# Patient Record
Sex: Female | Born: 1950 | Race: White | Hispanic: No | Marital: Single | Smoking: Never smoker
Health system: Southern US, Community
[De-identification: ages and names within clinical notes are randomized; demographics above are authoritative.]

## PROBLEM LIST (undated history)

## (undated) DIAGNOSIS — M75100 Unspecified rotator cuff tear or rupture of unspecified shoulder, not specified as traumatic: Secondary | ICD-10-CM

## (undated) DIAGNOSIS — F418 Other specified anxiety disorders: Secondary | ICD-10-CM

## (undated) DIAGNOSIS — R9431 Abnormal electrocardiogram [ECG] [EKG]: Secondary | ICD-10-CM

## (undated) DIAGNOSIS — M1990 Unspecified osteoarthritis, unspecified site: Secondary | ICD-10-CM

## (undated) DIAGNOSIS — S42209A Unspecified fracture of upper end of unspecified humerus, initial encounter for closed fracture: Secondary | ICD-10-CM

## (undated) DIAGNOSIS — Z0000 Encounter for general adult medical examination without abnormal findings: Secondary | ICD-10-CM

## (undated) DIAGNOSIS — E785 Hyperlipidemia, unspecified: Secondary | ICD-10-CM

## (undated) DIAGNOSIS — F32A Depression, unspecified: Secondary | ICD-10-CM

## (undated) DIAGNOSIS — Z889 Allergy status to unspecified drugs, medicaments and biological substances status: Secondary | ICD-10-CM

## (undated) DIAGNOSIS — T7840XA Allergy, unspecified, initial encounter: Secondary | ICD-10-CM

## (undated) DIAGNOSIS — F419 Anxiety disorder, unspecified: Secondary | ICD-10-CM

## (undated) DIAGNOSIS — F329 Major depressive disorder, single episode, unspecified: Secondary | ICD-10-CM

## (undated) DIAGNOSIS — IMO0002 Reserved for concepts with insufficient information to code with codable children: Secondary | ICD-10-CM

## (undated) DIAGNOSIS — M7500 Adhesive capsulitis of unspecified shoulder: Secondary | ICD-10-CM

## (undated) DIAGNOSIS — D649 Anemia, unspecified: Secondary | ICD-10-CM

## (undated) DIAGNOSIS — S52502A Unspecified fracture of the lower end of left radius, initial encounter for closed fracture: Secondary | ICD-10-CM

## (undated) DIAGNOSIS — G90519 Complex regional pain syndrome I of unspecified upper limb: Secondary | ICD-10-CM

## (undated) DIAGNOSIS — H269 Unspecified cataract: Secondary | ICD-10-CM

## (undated) HISTORY — DX: Major depressive disorder, single episode, unspecified: F32.9

## (undated) HISTORY — DX: Anemia, unspecified: D64.9

## (undated) HISTORY — PX: TONSILLECTOMY: SUR1361

## (undated) HISTORY — PX: FRACTURE SURGERY: SHX138

## (undated) HISTORY — DX: Allergy status to unspecified drugs, medicaments and biological substances status: Z88.9

## (undated) HISTORY — PX: LASIK: SHX215

## (undated) HISTORY — DX: Unspecified rotator cuff tear or rupture of unspecified shoulder, not specified as traumatic: M75.100

## (undated) HISTORY — DX: Reserved for concepts with insufficient information to code with codable children: IMO0002

## (undated) HISTORY — DX: Unspecified cataract: H26.9

## (undated) HISTORY — DX: Adhesive capsulitis of unspecified shoulder: M75.00

## (undated) HISTORY — DX: Complex regional pain syndrome I of unspecified upper limb: G90.519

## (undated) HISTORY — DX: Hyperlipidemia, unspecified: E78.5

## (undated) HISTORY — PX: EYE SURGERY: SHX253

## (undated) HISTORY — PX: JOINT REPLACEMENT: SHX530

## (undated) HISTORY — DX: Anxiety disorder, unspecified: F41.9

## (undated) HISTORY — DX: Unspecified osteoarthritis, unspecified site: M19.90

## (undated) HISTORY — PX: DENTAL SURGERY: SHX609

## (undated) HISTORY — DX: Other specified anxiety disorders: F41.8

## (undated) HISTORY — DX: Abnormal electrocardiogram (ECG) (EKG): R94.31

## (undated) HISTORY — DX: Depression, unspecified: F32.A

## (undated) HISTORY — DX: Allergy, unspecified, initial encounter: T78.40XA

## (undated) HISTORY — DX: Encounter for general adult medical examination without abnormal findings: Z00.00

---

## 1998-11-10 HISTORY — PX: REFRACTIVE SURGERY: SHX103

## 2012-12-02 ENCOUNTER — Encounter: Payer: Self-pay | Admitting: Internal Medicine

## 2012-12-02 ENCOUNTER — Ambulatory Visit (INDEPENDENT_AMBULATORY_CARE_PROVIDER_SITE_OTHER): Payer: Managed Care, Other (non HMO) | Admitting: Internal Medicine

## 2012-12-02 VITALS — BP 128/74 | HR 71 | Temp 97.9°F | Resp 18 | Ht 60.0 in | Wt 148.0 lb

## 2012-12-02 DIAGNOSIS — F32A Depression, unspecified: Secondary | ICD-10-CM

## 2012-12-02 DIAGNOSIS — F3289 Other specified depressive episodes: Secondary | ICD-10-CM

## 2012-12-02 DIAGNOSIS — Z2911 Encounter for prophylactic immunotherapy for respiratory syncytial virus (RSV): Secondary | ICD-10-CM

## 2012-12-02 DIAGNOSIS — M65839 Other synovitis and tenosynovitis, unspecified forearm: Secondary | ICD-10-CM

## 2012-12-02 DIAGNOSIS — F418 Other specified anxiety disorders: Secondary | ICD-10-CM

## 2012-12-02 DIAGNOSIS — Z23 Encounter for immunization: Secondary | ICD-10-CM

## 2012-12-02 DIAGNOSIS — L309 Dermatitis, unspecified: Secondary | ICD-10-CM | POA: Insufficient documentation

## 2012-12-02 DIAGNOSIS — K219 Gastro-esophageal reflux disease without esophagitis: Secondary | ICD-10-CM

## 2012-12-02 DIAGNOSIS — L259 Unspecified contact dermatitis, unspecified cause: Secondary | ICD-10-CM

## 2012-12-02 DIAGNOSIS — F411 Generalized anxiety disorder: Secondary | ICD-10-CM

## 2012-12-02 DIAGNOSIS — M1990 Unspecified osteoarthritis, unspecified site: Secondary | ICD-10-CM

## 2012-12-02 DIAGNOSIS — M778 Other enthesopathies, not elsewhere classified: Secondary | ICD-10-CM | POA: Insufficient documentation

## 2012-12-02 DIAGNOSIS — F329 Major depressive disorder, single episode, unspecified: Secondary | ICD-10-CM

## 2012-12-02 DIAGNOSIS — F419 Anxiety disorder, unspecified: Secondary | ICD-10-CM | POA: Insufficient documentation

## 2012-12-02 HISTORY — DX: Other specified anxiety disorders: F41.8

## 2012-12-02 MED ORDER — CLONAZEPAM 0.5 MG PO TABS: 0.5000 mg | ORAL_TABLET | Freq: Three times a day (TID) | ORAL | Status: DC | PRN

## 2012-12-02 MED ORDER — PANTOPRAZOLE SODIUM 40 MG PO TBEC: 40.0000 mg | DELAYED_RELEASE_TABLET | Freq: Every day | ORAL | Status: DC

## 2012-12-02 NOTE — Patient Instructions (Signed)
Schedule CPE  Flu vaccine today  Call when Shingles vaccine is ready

## 2012-12-02 NOTE — Progress Notes (Signed)
Subjective:    Patient ID: Stephanie Barnes, female    DOB: [birth year], 62 y.o.   MRN: [id number]  HPI  New pt here for first visit.  Moved here from [place] to live with family.  Former care Dr. Vernie Ammons.  PMH of DJD, GERD, anxiety controlled with klonopin,  Depression on wellbutrin and eczema.  She also reports chronic tendinitis of hand  She sees a Chiropodist and an acupuncturist here is [place]  Doing well   She is trying ot taper off Klonopin   She would like a shingles and fluc vaccine today  Allergies  Allergen Reactions  . Aspirin Swelling  . Celebrex (Celecoxib) Anaphylaxis  . Celexa (Citalopram)   . Doxycycline   . Other     Arthritec  . Prednisone    Past Medical History  Diagnosis Date  . Anxiety   . Arthritis   . Depression    Past Surgical History  Procedure Date  . Tonsillectomy    History   Social History  . Marital Status: Single    Spouse Name: N/A    Number of Children: N/A  . Years of Education: N/A   Occupational History  . Not on file.   Social History Main Topics  . Smoking status: Never Smoker   . Smokeless tobacco: Not on file  . Alcohol Use: 0.6 oz/week    1 Glasses of wine per week     Comment: per month  . Drug Use: No  . Sexually Active: No   Other Topics Concern  . Not on file   Social History Narrative  . No narrative on file   Family History  Problem Relation Age of Onset  . Heart disease Mother   . Heart disease Father   . COPD Father   . Mental illness Brother   . Hypertension Brother   . Suicidality Brother   . Heart disease Maternal Aunt   . Heart disease Maternal Uncle   . Heart disease Paternal Aunt   . COPD Paternal Aunt   . Heart disease Paternal Uncle   . Heart disease Maternal Grandmother   . Heart disease Maternal Grandfather   . Heart disease Paternal Grandmother   . Heart disease Paternal Grandfather    There is no problem list on file for this patient.  Current Outpatient Prescriptions on  File Prior to Visit  Medication Sig Dispense Refill  . buPROPion (WELLBUTRIN) 75 MG tablet Take 75 mg by mouth 2 (two) times daily. 1/4 tablet daily      . Calcium Carbonate-Vit D-Min (CALCIUM 1200 PO) Take 1 tablet by mouth 2 (two) times daily.      . clonazePAM (KLONOPIN) 0.5 MG tablet Take 0.5 mg by mouth 3 (three) times daily as needed.      . pantoprazole (PROTONIX) 40 MG tablet Take 40 mg by mouth daily.          Review of Systems       see HPI Objective:   Physical Exam Physical Exam  Nursing note and vitals reviewed.  Constitutional: She is oriented to person, place, and time. She appears well-developed and well-nourished.  HENT:  Head: Normocephalic and atraumatic.  Cardiovascular: Normal rate and regular rhythm. Exam reveals no gallop and no friction rub.  No murmur heard.  Pulmonary/Chest: Breath sounds normal. She has no wheezes. She has no rales.  Neurological: She is alert and oriented to person, place, and time.  Skin: Skin is warm and dry.  Psychiatric: She has a normal mood and affect. Her behavior is normal.             Assessment & Plan:  GERD  Continue Prevacid  Anxiety/depression continue current meds  Eczema  Controlled with Elocon  djd  Chronic tendinitis  Schedule cpe  Pt declines labs today   Will give influenza and Zostavax today

## 2013-01-07 ENCOUNTER — Other Ambulatory Visit: Payer: Self-pay | Admitting: *Deleted

## 2013-01-07 NOTE — Telephone Encounter (Signed)
Verified with pt pharmacy called in to [facility] pharmacy

## 2013-01-07 NOTE — Telephone Encounter (Signed)
Needs refill will call in pending approval

## 2013-01-11 ENCOUNTER — Telehealth: Payer: Self-pay | Admitting: *Deleted

## 2013-01-11 NOTE — Telephone Encounter (Signed)
Explained to pt that Dr Constance Goltz does not write rx or tx for a condition she is not seeing pt for. Pt reports that she has not seen anyone for SOS as of yet she has only spoken on the phone. Pt reports that they will treat her if she has a rx from [facility]. Suggested that her chiropractor or her previous acupuncturist could write for her tx. Pt states that they will not do so because it is a workers comp case. Pt responded by saying thank you and promptly hanging up on this RN

## 2013-01-17 ENCOUNTER — Other Ambulatory Visit: Payer: Self-pay | Admitting: Internal Medicine

## 2013-01-17 MED ORDER — CLONAZEPAM 0.5 MG PO TABS: 0.5000 mg | ORAL_TABLET | Freq: Three times a day (TID) | ORAL | Status: DC | PRN

## 2013-01-17 NOTE — Telephone Encounter (Signed)
See Ardenia's note will call in pending approval

## 2013-01-17 NOTE — Telephone Encounter (Signed)
Pt would like to know why there was only a thirty day supply of her clonazePAM (KLONOPIN) 0.5 MG tablet ... Per pt she take three a day; she need a 90 day supply of this medication... Pt would like a call back [phone]

## 2013-01-17 NOTE — Telephone Encounter (Signed)
Pt called to verify pharmacy and rx called in

## 2013-01-17 NOTE — Telephone Encounter (Signed)
Stephanie Barnes  OK to call in no refill

## 2013-02-23 ENCOUNTER — Other Ambulatory Visit: Payer: Self-pay | Admitting: *Deleted

## 2013-02-23 NOTE — Telephone Encounter (Signed)
Will call in pending approval 

## 2013-02-24 ENCOUNTER — Telehealth: Payer: Self-pay | Admitting: Internal Medicine

## 2013-02-24 MED ORDER — CLONAZEPAM 0.5 MG PO TABS: 0.5000 mg | ORAL_TABLET | Freq: Three times a day (TID) | ORAL | Status: DC | PRN

## 2013-02-24 NOTE — Telephone Encounter (Signed)
Pt would like refill on Clonazepam 0.5 mg.  To be called into pharmacy [facility] [phone]. Pt call back phone number [phone].

## 2013-02-24 NOTE — Telephone Encounter (Signed)
Klonopin called in to [facility] pt notified

## 2013-02-24 NOTE — Telephone Encounter (Signed)
Karen Kitchens   Ok to call in [place] as ordered    Thanks

## 2013-03-01 NOTE — Telephone Encounter (Signed)
Called in Klonopin [facility]

## 2013-04-14 ENCOUNTER — Other Ambulatory Visit: Payer: Self-pay | Admitting: Internal Medicine

## 2013-04-14 MED ORDER — CLONAZEPAM 0.5 MG PO TABS: 0.5000 mg | ORAL_TABLET | Freq: Three times a day (TID) | ORAL | Status: DC | PRN

## 2013-04-14 NOTE — Telephone Encounter (Signed)
Pt states that she tried and began to get depressed again states that she is in grief counseling presently will call this in per Dr Constance Goltz VO

## 2013-04-14 NOTE — Telephone Encounter (Signed)
Pt needs refill for Clonazepam 0.5 mg Qty 90 a month.  Pharmacy [facility] [phone].  Pt phone number [phone].

## 2013-05-17 ENCOUNTER — Other Ambulatory Visit: Payer: Self-pay | Admitting: *Deleted

## 2013-05-17 MED ORDER — CLONAZEPAM 0.5 MG PO TABS: 0.5000 mg | ORAL_TABLET | Freq: Three times a day (TID) | ORAL | Status: DC | PRN

## 2013-05-17 NOTE — Telephone Encounter (Signed)
Needs refill on clonazePAM (KLONOPIN) 0.5 MG tablet  Called into pharmacy.  She has 4 days left.  She will be having oral surgery this afternoon and will be unable to talk on the phone today.  If you need to talk to her try her tomorrow.

## 2013-05-17 NOTE — Telephone Encounter (Signed)
Klonopin called in to [facility] pharmacy pt states that she usually only takes 2 a day but if she is having a bad day will take three. Notified pt that we will call in 30 tablets with one refill and that she will need to see Dr Constance Goltz in the office if this is not an effective number of tablets

## 2013-05-17 NOTE — Telephone Encounter (Addendum)
Will call in pending approval 

## 2013-05-17 NOTE — Telephone Encounter (Signed)
Stephanie Barnes  Call this pt. And ask how often she is using her Klonopin .   Refill wanted 90 pills.  I usually do not give 90 of a benzodiazepine.    Message back wilth response.  I approved 30 pills with 1 refill

## 2013-06-09 ENCOUNTER — Ambulatory Visit (INDEPENDENT_AMBULATORY_CARE_PROVIDER_SITE_OTHER): Payer: Managed Care, Other (non HMO) | Admitting: Internal Medicine

## 2013-06-09 ENCOUNTER — Encounter: Payer: Self-pay | Admitting: Internal Medicine

## 2013-06-09 VITALS — BP 136/88 | HR 98 | Temp 97.0°F | Resp 18 | Wt 139.0 lb

## 2013-06-09 DIAGNOSIS — G4700 Insomnia, unspecified: Secondary | ICD-10-CM | POA: Insufficient documentation

## 2013-06-09 DIAGNOSIS — J329 Chronic sinusitis, unspecified: Secondary | ICD-10-CM

## 2013-06-09 MED ORDER — AZITHROMYCIN 250 MG PO TABS: ORAL_TABLET | ORAL | Status: DC

## 2013-06-09 MED ORDER — CEFTRIAXONE SODIUM 1 G IJ SOLR
1.0000 g | Freq: Once | INTRAMUSCULAR | Status: AC
  Administered 2013-06-09: 1 g via INTRAMUSCULAR

## 2013-06-09 NOTE — Progress Notes (Signed)
Subjective:    Patient ID: Stephanie Barnes, female    DOB: 12/31/1950, 62 y.o.   MRN: [id number]  HPI  Stephanie Barnes is here with acute visit.  She is having lots of facial and sinus pain.  She describes that she has had 2 periodontal surgeries.  6/30 and 7/8  She was given two 5 day courses of Amoxicillin.  She describes post operatively lots of pain and she could barely eat  - she lost 13 lbs in two weeks.    For the past 10-14 days she has had severe maxillary and frontal sinus pain.  No fever. Symptoms associated with headache.  She has lots of nasal stuffiness but is not having green nasal discharge.    No sore throat no eye symptoms  She tells me she has an appt with her peri-odontist next week  She is having lost of insomnia  Klonopin  ( which she has been on long term prior to moving to [place])  0.5 mg is not helping  Allergies  Allergen Reactions  . Aspirin Swelling  . Celebrex (Celecoxib) Anaphylaxis  . Celexa (Citalopram)   . Clindamycin/Lincomycin   . Doxycycline   . Other     Arthritec  . Prednisone    Past Medical History  Diagnosis Date  . Anxiety   . Arthritis   . Depression    Past Surgical History  Procedure Laterality Date  . Tonsillectomy    . Dental surgery      Root cleaning    History   Social History  . Marital Status: Single    Spouse Name: N/A    Number of Children: N/A  . Years of Education: N/A   Occupational History  . Not on file.   Social History Main Topics  . Smoking status: Never Smoker   . Smokeless tobacco: Not on file  . Alcohol Use: 0.6 oz/week    1 Glasses of wine per week     Comment: per month  . Drug Use: No  . Sexually Active: No   Other Topics Concern  . Not on file   Social History Narrative  . No narrative on file   Family History  Problem Relation Age of Onset  . Heart disease Mother   . Heart disease Father   . COPD Father   . Mental illness Brother   . Hypertension Brother   . Suicidality Brother   . Heart  disease Maternal Aunt   . Heart disease Maternal Uncle   . Heart disease Paternal Aunt   . COPD Paternal Aunt   . Heart disease Paternal Uncle   . Heart disease Maternal Grandmother   . Heart disease Maternal Grandfather   . Heart disease Paternal Grandmother   . Heart disease Paternal Grandfather    Patient Active Problem List   Diagnosis Date Noted  . Depression 12/02/2012  . Anxiety 12/02/2012  . DJD (degenerative joint disease) 12/02/2012  . GERD (gastroesophageal reflux disease) 12/02/2012  . Eczema 12/02/2012  . Tendinitis of hand 12/02/2012   Current Outpatient Prescriptions on File Prior to Visit  Medication Sig Dispense Refill  . buPROPion (WELLBUTRIN) 75 MG tablet Take 75 mg by mouth daily. 1/4 tablet daily      . mometasone (ELOCON) 0.1 % ointment Apply topically daily. As needed      . Omega-3 Fatty Acids (FISH OIL) 1000 MG CAPS Take by mouth 3 (three) times daily.      . pantoprazole (PROTONIX) 40 MG tablet  Take 1 tablet (40 mg total) by mouth daily.  30 tablet  5  . Probiotic Product (PROBIOTIC DAILY PO) Take by mouth.      . Calcium Carbonate-Vit D-Min (CALCIUM 1200 PO) Take 1 tablet by mouth 2 (two) times daily.      . clonazePAM (KLONOPIN) 0.5 MG tablet Take 1 tablet (0.5 mg total) by mouth 3 (three) times daily as needed.  30 tablet  1   No current facility-administered medications on file prior to visit.     Review of Systems    see HPI Objective:   Physical Exam Physical Exam  Nursing note and vitals reviewed.  Constitutional: She is oriented to person, place, and time. She appears well-developed and well-nourished.  HENT: TMs  She has bilateral serous effusions Very tender maxillary and frontal sinus tenderness No post auricular tenderness Head: Normocephalic and atraumatic.  Cardiovascular: Normal rate and regular rhythm. Exam reveals no gallop and no friction rub.  No murmur heard.  Pulmonary/Chest: Breath sounds normal. She has no wheezes. She  has no rales.  Neurological: She is alert and oriented to person, place, and time.  Skin: Skin is warm and dry.  Psychiatric: She has a normal mood and affect. Her behavior is normal.        Assessment & Plan:  Sinusitis  Will give rocephin  1 gm in office and Z-pak  .  ADvised to be sure to keep follow up appat with periodonits  Insomnia  OK to take klonopinm two 0.5 mg qhs

## 2013-06-13 ENCOUNTER — Ambulatory Visit (INDEPENDENT_AMBULATORY_CARE_PROVIDER_SITE_OTHER): Payer: Managed Care, Other (non HMO) | Admitting: Internal Medicine

## 2013-06-13 ENCOUNTER — Encounter: Payer: Self-pay | Admitting: Internal Medicine

## 2013-06-13 VITALS — BP 133/87 | HR 108 | Temp 97.8°F | Resp 16 | Ht 59.5 in | Wt 136.0 lb

## 2013-06-13 DIAGNOSIS — R9431 Abnormal electrocardiogram [ECG] [EKG]: Secondary | ICD-10-CM | POA: Insufficient documentation

## 2013-06-13 DIAGNOSIS — F411 Generalized anxiety disorder: Secondary | ICD-10-CM

## 2013-06-13 DIAGNOSIS — Z1151 Encounter for screening for human papillomavirus (HPV): Secondary | ICD-10-CM

## 2013-06-13 DIAGNOSIS — F419 Anxiety disorder, unspecified: Secondary | ICD-10-CM

## 2013-06-13 DIAGNOSIS — Z0000 Encounter for general adult medical examination without abnormal findings: Secondary | ICD-10-CM

## 2013-06-13 DIAGNOSIS — Z124 Encounter for screening for malignant neoplasm of cervix: Secondary | ICD-10-CM

## 2013-06-13 DIAGNOSIS — E785 Hyperlipidemia, unspecified: Secondary | ICD-10-CM | POA: Insufficient documentation

## 2013-06-13 DIAGNOSIS — K219 Gastro-esophageal reflux disease without esophagitis: Secondary | ICD-10-CM

## 2013-06-13 DIAGNOSIS — M1990 Unspecified osteoarthritis, unspecified site: Secondary | ICD-10-CM

## 2013-06-13 DIAGNOSIS — M8580 Other specified disorders of bone density and structure, unspecified site: Secondary | ICD-10-CM | POA: Insufficient documentation

## 2013-06-13 DIAGNOSIS — M899 Disorder of bone, unspecified: Secondary | ICD-10-CM

## 2013-06-13 HISTORY — DX: Abnormal electrocardiogram (ECG) (EKG): R94.31

## 2013-06-13 LAB — POCT URINALYSIS DIPSTICK
Ketones, UA: NEGATIVE
Leukocytes, UA: NEGATIVE
Protein, UA: NEGATIVE
Spec Grav, UA: 1.015
pH, UA: 6.5

## 2013-06-13 LAB — LIPID PANEL
HDL: 69 mg/dL (ref >39)
LDL Cholesterol: 136 mg/dL — ABNORMAL HIGH (ref 0–99)
Triglycerides: 140 mg/dL (ref <150)
VLDL: 28 mg/dL (ref 0–40)

## 2013-06-13 LAB — CBC WITH DIFFERENTIAL/PLATELET
Basophils Absolute: 0 10*3/uL (ref 0.0–0.1)
Eosinophils Relative: 2 % (ref 0–5)
Lymphocytes Relative: 49 % — ABNORMAL HIGH (ref 12–46)
MCV: 85.4 fL (ref 78.0–100.0)
Neutrophils Relative %: 39 % — ABNORMAL LOW (ref 43–77)
Platelets: 257 10*3/uL (ref 150–400)
RDW: 14.5 % (ref 11.5–15.5)
WBC: 5.2 10*3/uL (ref 4.0–10.5)

## 2013-06-13 LAB — COMPREHENSIVE METABOLIC PANEL
ALT: 20 U/L (ref 0–35)
AST: 15 U/L (ref 0–37)
Calcium: 10.3 mg/dL (ref 8.4–10.5)
Chloride: 101 mEq/L (ref 96–112)
Creat: 0.98 mg/dL (ref 0.50–1.10)
Total Bilirubin: 0.4 mg/dL (ref 0.3–1.2)

## 2013-06-13 MED ORDER — CLONAZEPAM 1 MG PO TABS: ORAL_TABLET | ORAL | Status: DC

## 2013-06-13 NOTE — Progress Notes (Addendum)
Subjective:    Patient ID: Stephanie Barnes, female    DOB: 03-Dec-1950, 62 y.o.   MRN: [id number]  HPI  Ima is here for CPE and to follow on her sinus pain.  She is feeling much better after her antibiotics  No fever  No pain.  She says her swelling has resolved  See EKG   She tells me she has seen a cardiologist in     She has a stress thallium 06/2012 per her report which she tells me was "fine" and needed no further work up.  She had 2 episodes of chest tightness while at her former job But since she is retired she has not had any further episodes of chest discomfort, no dizziness, no epigastric pain  Npo excessive fatigue    See EKG  She does have inverted T waves V1-V3  But there is same findings on her 2005 EKG  Allergies  Allergen Reactions  . Aspirin Swelling  . Celebrex (Celecoxib) Anaphylaxis  . Celexa (Citalopram)   . Clindamycin/Lincomycin   . Doxycycline   . Fosamax (Alendronate Sodium)   . Other     Arthritec  . Prednisone    Past Medical History  Diagnosis Date  . Anxiety   . Arthritis   . Depression    Past Surgical History  Procedure Laterality Date  . Tonsillectomy    . Dental surgery      Root cleaning    History   Social History  . Marital Status: Single    Spouse Name: N/A    Number of Children: N/A  . Years of Education: N/A   Occupational History  . Not on file.   Social History Main Topics  . Smoking status: Never Smoker   . Smokeless tobacco: Not on file  . Alcohol Use: 0.6 oz/week    1 Glasses of wine per week     Comment: per month  . Drug Use: No  . Sexually Active: No   Other Topics Concern  . Not on file   Social History Narrative  . No narrative on file   Family History  Problem Relation Age of Onset  . Heart disease Mother   . Heart disease Father   . COPD Father   . Mental illness Brother   . Hypertension Brother   . Suicidality Brother   . Heart disease Maternal Aunt   . Heart disease Maternal Uncle   . Heart  disease Paternal Aunt   . COPD Paternal Aunt   . Heart disease Paternal Uncle   . Heart disease Maternal Grandmother   . Heart disease Maternal Grandfather   . Heart disease Paternal Grandmother   . Heart disease Paternal Grandfather    Patient Active Problem List   Diagnosis Date Noted  . Hyperlipidemia 06/13/2013  . Abnormal EKG 06/13/2013  . Insomnia 06/09/2013  . Depression 12/02/2012  . Anxiety 12/02/2012  . DJD (degenerative joint disease) 12/02/2012  . GERD (gastroesophageal reflux disease) 12/02/2012  . Eczema 12/02/2012  . Tendinitis of hand 12/02/2012   Current Outpatient Prescriptions on File Prior to Visit  Medication Sig Dispense Refill  . azithromycin (ZITHROMAX) 250 MG tablet Take as directed  6 tablet  0  . buPROPion (WELLBUTRIN) 75 MG tablet Take 75 mg by mouth daily. 1/4 tablet daily      . Calcium Carbonate-Vit D-Min (CALCIUM 1200 PO) Take 1 tablet by mouth 2 (two) times daily.      . cetirizine (ZYRTEC) 10 MG tablet  Take 10 mg by mouth daily.      Marland Kitchen guaiFENesin (MUCINEX) 600 MG 12 hr tablet Take 1,200 mg by mouth 2 (two) times daily.      . mometasone (ELOCON) 0.1 % ointment Apply topically daily. As needed      . Omega-3 Fatty Acids (FISH OIL) 1000 MG CAPS Take by mouth 3 (three) times daily.      . pantoprazole (PROTONIX) 40 MG tablet Take 1 tablet (40 mg total) by mouth daily.  30 tablet  5  . Probiotic Product (PROBIOTIC DAILY PO) Take by mouth.       No current facility-administered medications on file prior to visit.      Review of Systems  Respiratory: Negative for chest tightness and shortness of breath.   Cardiovascular: Negative for chest pain and palpitations.       Objective:   Physical Exam Physical Exam  Vital signs and nursing note reviewed  Constitutional: She is oriented to person, place, and time. She appears well-developed and well-nourished. She is cooperative.  HENT:  Head: Normocephalic and atraumatic.  Right Ear: Tympanic  membrane normal.  Left Ear: Tympanic membrane normal.  Nose: Nose normal.  Mouth/Throat: Oropharynx is clear and moist and mucous membranes are normal. No oropharyngeal exudate or posterior oropharyngeal erythema.  Eyes: Conjunctivae and EOM are normal. Pupils are equal, round, and reactive to light.  Neck: Neck supple. No JVD present. Carotid bruit is not present. No mass and no thyromegaly present.  Cardiovascular: Regular rhythm, normal heart sounds, intact distal pulses and normal pulses.  Exam reveals no gallop and no friction rub.   No murmur heard. Pulses:      Dorsalis pedis pulses are 2+ on the right side, and 2+ on the left side.  Pulmonary/Chest: Breath sounds normal. She has no wheezes. She has no rhonchi. She has no rales. Right breast exhibits no mass, no nipple discharge and no skin change. Left breast exhibits no mass, no nipple discharge and no skin change.  Abdominal: Soft. Bowel sounds are normal. She exhibits no distension and no mass. There is no hepatosplenomegaly. There is no tenderness. There is no CVA tenderness.  Genitourinary: Rectum normal, vagina normal and uterus normal. Rectal exam shows no mass. Guaiac negative stool. No labial fusion. There is no lesion on the right labia. There is no lesion on the left labia. Cervix exhibits no motion tenderness. Right adnexum displays no mass, no tenderness and no fullness. Left adnexum displays no mass, no tenderness and no fullness. No erythema around the vagina.  Musculoskeletal:       No active synovitis to any joint.    Lymphadenopathy:       Right cervical: No superficial cervical adenopathy present.      Left cervical: No superficial cervical adenopathy present.       Right axillary: No pectoral and no lateral adenopathy present.       Left axillary: No pectoral and no lateral adenopathy present.      Right: No inguinal adenopathy present.       Left: No inguinal adenopathy present.  Neurological: She is alert and  oriented to person, place, and time. She has normal strength and normal reflexes. No cranial nerve deficit or sensory deficit. She displays a negative Romberg sign. Coordination and gait normal.  Skin: Skin is warm and dry. No abrasion, no bruising, no ecchymosis and no rash noted. No cyanosis. Nails show no clubbing.  Psychiatric: She has a normal mood and  affect. Her speech is normal and behavior is normal.          Assessment & Plan:   Health maintenance  Will schedule 3-d mm and Dexa   [facility] the [facility]  Abnormal EKG  TWI anteriorly:  Since pt has had a stress thallium will need to see old records.  She does not wish to establish with a cardiologist now and declines referral.  Will get old records and further management based on prior  Results.  She was told her chest symptoms were due to stress and she has been asymptomatic since her retirement  Osteopenia  She is on Calcium and vitamin D  Mother had osteoporosis  Will get DExa  Hyperlipidemia  Will check today  Mixed anxiety/depression  OK to increase  Klonopin to 1 mg  Take 1/2 to one tablet bid  GERD  Continue Protonix  Addendum  TWI anteriorly  present  On old EKG form 2005  See scanned report  See me as needed        Assessment & Plan:

## 2013-06-14 ENCOUNTER — Telehealth: Payer: Self-pay | Admitting: *Deleted

## 2013-06-14 ENCOUNTER — Encounter: Payer: Self-pay | Admitting: *Deleted

## 2013-06-14 NOTE — Telephone Encounter (Signed)
Called pt to go over lab results - she plans to follow the DASH diet and will follow up in 6 months to have lipid panel rechecked - labs mailed to pt

## 2013-06-14 NOTE — Telephone Encounter (Signed)
Message copied by Arne Cleveland on Tue Jun 14, 2013  8:40 AM ------      Message from: Raechel Chute D      Created: Tue Jun 14, 2013  8:34 AM       [facility]              Call pt and tell her that her cholesterol  Is a little elevarted.  ADvise to follow DASH diet that I gave her and tell her to see me inoffice in 6 months and I need to recheck her cholesterol.   Continue her fish  Oil            Ok to mail labs to pt  I placed on  Your desk ------

## 2013-06-23 ENCOUNTER — Encounter: Payer: Self-pay | Admitting: *Deleted

## 2013-06-26 ENCOUNTER — Telehealth: Payer: Self-pay | Admitting: Internal Medicine

## 2013-06-26 NOTE — Telephone Encounter (Signed)
[employer] and let her know that I have received and reviewed her cardiac testing done in [place].  I think it is OK not to do any furhter testing now unless any of her chest symptoms return  Thanks

## 2013-06-27 ENCOUNTER — Encounter: Payer: Self-pay | Admitting: *Deleted

## 2013-06-27 NOTE — Telephone Encounter (Signed)
Notified pt that she does not need any further testing advised pt to seek emergency care if she develops chest pain SOB

## 2013-07-05 ENCOUNTER — Ambulatory Visit
Admission: RE | Admit: 2013-07-05 | Discharge: 2013-07-05 | Disposition: A | Discharge status: Home / Self Care | Payer: Managed Care, Other (non HMO) | Source: Ambulatory Visit | Attending: Internal Medicine | Admitting: Internal Medicine

## 2013-07-05 ENCOUNTER — Ambulatory Visit: Admission: RE | Admit: 2013-07-05 | Payer: Managed Care, Other (non HMO) | Source: Ambulatory Visit

## 2013-07-05 ENCOUNTER — Other Ambulatory Visit: Payer: Self-pay | Admitting: Internal Medicine

## 2013-07-05 DIAGNOSIS — Z1231 Encounter for screening mammogram for malignant neoplasm of breast: Secondary | ICD-10-CM

## 2013-07-05 DIAGNOSIS — M8580 Other specified disorders of bone density and structure, unspecified site: Secondary | ICD-10-CM

## 2013-07-09 ENCOUNTER — Encounter: Payer: Self-pay | Admitting: Internal Medicine

## 2013-07-09 DIAGNOSIS — Z9289 Personal history of other medical treatment: Secondary | ICD-10-CM | POA: Insufficient documentation

## 2013-07-12 ENCOUNTER — Other Ambulatory Visit: Payer: Self-pay | Admitting: *Deleted

## 2013-07-12 MED ORDER — BUPROPION HCL 75 MG PO TABS: 75.0000 mg | ORAL_TABLET | Freq: Every day | ORAL | Status: DC

## 2013-07-12 MED ORDER — CLONAZEPAM 1 MG PO TABS: ORAL_TABLET | ORAL | Status: DC

## 2013-07-12 MED ORDER — PANTOPRAZOLE SODIUM 40 MG PO TBEC: 40.0000 mg | DELAYED_RELEASE_TABLET | Freq: Every day | ORAL | Status: DC

## 2013-07-12 NOTE — Telephone Encounter (Signed)
Stephanie Barnes called this am needing 3 Rx refills called in. Butropion 75 mg 1/4 tab once daily Pantoprazole 40 mg once daily Clonazepam 1 mg once daily

## 2013-07-12 NOTE — Telephone Encounter (Signed)
Stephanie Barnes   Check on the Wellbutrin dosing to see if it is correct and route back to me  [facility] to call in Klonopin and Protonix ok for #90 with 1 rf

## 2013-07-12 NOTE — Telephone Encounter (Signed)
Refill request will call in clonazepam pending approval

## 2013-07-12 NOTE — Telephone Encounter (Signed)
Called pt to clarify dosage of Welbutrin pt states that she has been taking this for a while and her former psychiatrist developed this dosage for her because she is very sensitive to medications

## 2013-07-15 ENCOUNTER — Telehealth: Payer: Self-pay | Admitting: *Deleted

## 2013-07-15 NOTE — Telephone Encounter (Signed)
Pt returned call regarding bone density

## 2013-07-15 NOTE — Telephone Encounter (Signed)
Message copied by Mathews Robinsons on Fri Jul 15, 2013  9:41 AM ------      Message from: Raechel Chute D      Created: Sat Jul 09, 2013  9:13 PM       Karen Kitchens            Call pt and let her know that her bone density shows that she has osteopenia  (bones just starting to thin)  Advise her to take calcium 1200-15-- mg daily and Vitamin D 1000 units daily ------

## 2013-07-15 NOTE — Telephone Encounter (Signed)
Notified pt of bone density results and advised her to take ca and vit D per Dr Constance Goltz

## 2013-08-10 ENCOUNTER — Telehealth: Payer: Self-pay | Admitting: *Deleted

## 2013-08-10 NOTE — Telephone Encounter (Signed)
error 

## 2013-08-24 ENCOUNTER — Other Ambulatory Visit: Payer: Self-pay | Admitting: *Deleted

## 2013-08-24 NOTE — Telephone Encounter (Signed)
Refill request

## 2013-08-27 MED ORDER — PANTOPRAZOLE SODIUM 40 MG PO TBEC: 40.0000 mg | DELAYED_RELEASE_TABLET | Freq: Every day | ORAL | Status: DC

## 2013-08-27 MED ORDER — BUPROPION HCL 75 MG PO TABS: 75.0000 mg | ORAL_TABLET | Freq: Every day | ORAL | Status: DC

## 2013-08-29 ENCOUNTER — Other Ambulatory Visit: Payer: Self-pay | Admitting: *Deleted

## 2013-08-29 MED ORDER — PANTOPRAZOLE SODIUM 40 MG PO TBEC: 40.0000 mg | DELAYED_RELEASE_TABLET | Freq: Every day | ORAL | Status: DC

## 2013-08-29 MED ORDER — BUPROPION HCL 75 MG PO TABS: ORAL_TABLET | ORAL | Status: DC

## 2013-08-29 NOTE — Telephone Encounter (Signed)
Refill request

## 2013-09-01 ENCOUNTER — Other Ambulatory Visit: Payer: Self-pay | Admitting: *Deleted

## 2013-09-01 ENCOUNTER — Telehealth: Payer: Self-pay | Admitting: *Deleted

## 2013-09-01 MED ORDER — PANTOPRAZOLE SODIUM 40 MG PO TBEC: 40.0000 mg | DELAYED_RELEASE_TABLET | Freq: Every day | ORAL | Status: DC

## 2013-09-01 MED ORDER — BUPROPION HCL 75 MG PO TABS: ORAL_TABLET | ORAL | Status: DC

## 2013-09-05 ENCOUNTER — Telehealth: Payer: Self-pay | Admitting: *Deleted

## 2013-09-05 ENCOUNTER — Other Ambulatory Visit: Payer: Self-pay | Admitting: *Deleted

## 2013-09-05 MED ORDER — BUPROPION HCL 75 MG PO TABS: ORAL_TABLET | ORAL | Status: DC

## 2013-09-05 NOTE — Telephone Encounter (Signed)
Spoke with pt regarding [facility] and her recent medication refill

## 2013-09-05 NOTE — Telephone Encounter (Signed)
RX sent to pharmacy again

## 2013-09-12 ENCOUNTER — Telehealth: Payer: Self-pay | Admitting: *Deleted

## 2013-09-12 ENCOUNTER — Other Ambulatory Visit: Payer: Self-pay | Admitting: *Deleted

## 2013-09-12 MED ORDER — CLONAZEPAM 1 MG PO TABS: ORAL_TABLET | ORAL | Status: DC

## 2013-09-12 NOTE — Telephone Encounter (Signed)
Called in klonopin to [facility] pharmacy. Spoke with Elita Quick

## 2013-09-12 NOTE — Telephone Encounter (Signed)
Bobbie  Ok to call in 

## 2013-09-12 NOTE — Telephone Encounter (Signed)
Needs refill clonazePAM (KLONOPIN) 1 MG tablet [[id number]] to [facility]

## 2013-09-12 NOTE — Telephone Encounter (Signed)
Refill request will call in pending approval 

## 2013-09-29 ENCOUNTER — Encounter: Payer: Self-pay | Admitting: Internal Medicine

## 2013-09-29 ENCOUNTER — Ambulatory Visit (INDEPENDENT_AMBULATORY_CARE_PROVIDER_SITE_OTHER): Payer: Managed Care, Other (non HMO) | Admitting: Internal Medicine

## 2013-09-29 VITALS — BP 136/86 | HR 102 | Temp 98.1°F | Resp 18 | Wt 133.0 lb

## 2013-09-29 DIAGNOSIS — IMO0001 Reserved for inherently not codable concepts without codable children: Secondary | ICD-10-CM

## 2013-09-29 DIAGNOSIS — J329 Chronic sinusitis, unspecified: Secondary | ICD-10-CM

## 2013-09-29 DIAGNOSIS — E785 Hyperlipidemia, unspecified: Secondary | ICD-10-CM

## 2013-09-29 DIAGNOSIS — R030 Elevated blood-pressure reading, without diagnosis of hypertension: Secondary | ICD-10-CM

## 2013-09-29 MED ORDER — AZITHROMYCIN 250 MG PO TABS: ORAL_TABLET | ORAL | Status: DC

## 2013-09-29 NOTE — Progress Notes (Signed)
Subjective:    Patient ID: Stephanie Barnes, female    DOB: [birth year], 62 y.o.   MRN: [id number]  HPI Stephanie Barnes is here for acute visit  Maxillary sinus pain last two weeks and L ear discomfort.  No fever no sore throat no cough.    See BP  She has been  At  [facility] ICU as sister in law had a terrible  Stroke and she has been under considerable stress.  See lipids she has changed her diet   Allergies  Allergen Reactions  . Aspirin Swelling  . Celebrex [Celecoxib] Anaphylaxis  . Celexa [Citalopram]   . Clindamycin/Lincomycin   . Doxycycline   . Fosamax [Alendronate Sodium]   . Other     Arthritec  . Prednisone    Past Medical History  Diagnosis Date  . Anxiety   . Arthritis   . Depression    Past Surgical History  Procedure Laterality Date  . Tonsillectomy    . Dental surgery      Root cleaning    History   Social History  . Marital Status: Single    Spouse Name: N/A    Number of Children: N/A  . Years of Education: N/A   Occupational History  . Not on file.   Social History Main Topics  . Smoking status: Never Smoker   . Smokeless tobacco: Not on file  . Alcohol Use: 0.6 oz/week    1 Glasses of wine per week     Comment: per month  . Drug Use: No  . Sexual Activity: No   Other Topics Concern  . Not on file   Social History Narrative  . No narrative on file   Family History  Problem Relation Age of Onset  . Heart disease Mother   . Heart disease Father   . COPD Father   . Mental illness Brother   . Hypertension Brother   . Suicidality Brother   . Heart disease Maternal Aunt   . Heart disease Maternal Uncle   . Heart disease Paternal Aunt   . COPD Paternal Aunt   . Heart disease Paternal Uncle   . Heart disease Maternal Grandmother   . Heart disease Maternal Grandfather   . Heart disease Paternal Grandmother   . Heart disease Paternal Grandfather    Patient Active Problem List   Diagnosis Date Noted  . H/O bone density study 07/09/2013  .  Hyperlipidemia 06/13/2013  . Abnormal EKG 06/13/2013  . Osteopenia 06/13/2013  . Insomnia 06/09/2013  . Depression 12/02/2012  . Anxiety 12/02/2012  . DJD (degenerative joint disease) 12/02/2012  . GERD (gastroesophageal reflux disease) 12/02/2012  . Eczema 12/02/2012  . Tendinitis of hand 12/02/2012   Current Outpatient Prescriptions on File Prior to Visit  Medication Sig Dispense Refill  . buPROPion (WELLBUTRIN) 75 MG tablet 1/4 tablet daily  3 tablet  0  . Calcium Carbonate-Vit D-Min (CALCIUM 1200 PO) Take 1 tablet by mouth 2 (two) times daily.      . cetirizine (ZYRTEC) 10 MG tablet Take 10 mg by mouth daily.      . clonazePAM (KLONOPIN) 1 MG tablet Take 1/2 or one tablet bid prn  30 tablet  1  . guaiFENesin (MUCINEX) 600 MG 12 hr tablet Take 1,200 mg by mouth 2 (two) times daily.      . Omega-3 Fatty Acids (FISH OIL) 1000 MG CAPS Take by mouth 3 (three) times daily.      . pantoprazole (PROTONIX) 40 MG tablet  Take 1 tablet (40 mg total) by mouth daily.  90 tablet  1  . Probiotic Product (PROBIOTIC DAILY PO) Take by mouth.      . mometasone (ELOCON) 0.1 % ointment Apply topically daily. As needed       No current facility-administered medications on file prior to visit.       Review of Systems    see HPI Objective:   Physical Exam   Physical Exam  Constitutional: She is oriented to person, place, and time. She appears well-developed and well-nourished. She is cooperative.  HENT:  Head: Normocephalic and atraumatic.  Right Ear: A middle ear effusion is present.  Left Ear: A middle ear effusion is present.  Nose: Mucosal edema present. Right sinus exhibits maxillary sinus tenderness. Left sinus exhibits maxillary sinus tenderness.  Mouth/Throat: Posterior oropharyngeal erythema present.  Serous effusion bilaterally  Maxillary sinus tenderness bilaterall Eyes: Conjunctivae and EOM are normal. Pupils are equal, round, and reactive to light.  Neck: Neck supple. Carotid  bruit is not present. No mass present.  Cardiovascular: Regular rhythm, normal heart sounds, intact distal pulses and normal pulses. Exam reveals no gallop and no friction rub.  No murmur heard.  Pulmonary/Chest: Breath sounds normal. She has no wheezes. She has no rhonchi. She has no rales.  Neurological: She is alert and oriented to person, place, and time.  Skin: Skin is warm and dry. No abrasion, no bruising, no ecchymosis and no rash noted. No cyanosis. Nails show no clubbing.  Psychiatric: She has a normal mood and affect. Her speech is normal and behavior is normal.        Assessment & Plan:  Sinusitis   Will give Z-pack  Elevated bp  Advised to see me in 3-4 months for recheck.  Bp normalized on my exam  Hyperlipidemia  Pt had declined meds in the past.    She has changed to a more plant based diet.  Will recheck fasting levels at next visit  See me in 3-4 months

## 2013-09-29 NOTE — Patient Instructions (Signed)
See me in 3-4 months  Take meds as prescribed

## 2013-11-14 ENCOUNTER — Telehealth: Payer: Self-pay | Admitting: *Deleted

## 2013-11-14 ENCOUNTER — Other Ambulatory Visit: Payer: Self-pay | Admitting: *Deleted

## 2013-11-14 MED ORDER — CLONAZEPAM 1 MG PO TABS: ORAL_TABLET | ORAL | Status: DC

## 2013-11-14 NOTE — Telephone Encounter (Signed)
Refill request

## 2013-11-14 NOTE — Telephone Encounter (Signed)
Needs Refill sent to [facility] on [address]  .[facility]...  clonazePAM (KLONOPIN) 1 MG tablet

## 2013-11-14 NOTE — Telephone Encounter (Signed)
Klonopin called into [facility]

## 2014-01-02 ENCOUNTER — Ambulatory Visit (INDEPENDENT_AMBULATORY_CARE_PROVIDER_SITE_OTHER): Payer: Managed Care, Other (non HMO) | Admitting: Internal Medicine

## 2014-01-02 ENCOUNTER — Encounter: Payer: Self-pay | Admitting: Internal Medicine

## 2014-01-02 VITALS — BP 128/78 | HR 72 | Temp 98.2°F | Resp 18 | Wt 132.0 lb

## 2014-01-02 DIAGNOSIS — R030 Elevated blood-pressure reading, without diagnosis of hypertension: Secondary | ICD-10-CM

## 2014-01-02 DIAGNOSIS — IMO0001 Reserved for inherently not codable concepts without codable children: Secondary | ICD-10-CM

## 2014-01-02 DIAGNOSIS — E785 Hyperlipidemia, unspecified: Secondary | ICD-10-CM

## 2014-01-02 LAB — LIPID PANEL
Cholesterol: 218 mg/dL — ABNORMAL HIGH (ref 0–200)
HDL: 82 mg/dL (ref >39)
LDL CALC: 118 mg/dL — AB (ref 0–99)
TRIGLYCERIDES: 92 mg/dL (ref <150)
Total CHOL/HDL Ratio: 2.7 Ratio
VLDL: 18 mg/dL (ref 0–40)

## 2014-01-02 NOTE — Patient Instructions (Addendum)
See me as needed 

## 2014-01-02 NOTE — Progress Notes (Signed)
Subjective:    Patient ID: Stephanie Barnes, female    DOB: 1951/04/08, 63 y.o.   MRN: [id number]  HPI Merdith is here for follow up of elevated BP and hyperlipidemia.   She is eating plant based diet and BP's at home have been 400'Q systolic.   She does see an acupuncturist for shoudler tendinitis and sinus drainage.    Allergies  Allergen Reactions  . Aspirin Swelling  . Celebrex [Celecoxib] Anaphylaxis  . Celexa [Citalopram]   . Clindamycin/Lincomycin   . Doxycycline   . Fosamax [Alendronate Sodium]   . Other     Arthritec  . Prednisone    Past Medical History  Diagnosis Date  . Anxiety   . Arthritis   . Depression    Past Surgical History  Procedure Laterality Date  . Tonsillectomy    . Dental surgery      Root cleaning    History   Social History  . Marital Status: Single    Spouse Name: N/A    Number of Children: N/A  . Years of Education: N/A   Occupational History  . Not on file.   Social History Main Topics  . Smoking status: Never Smoker   . Smokeless tobacco: Not on file  . Alcohol Use: 0.6 oz/week    1 Glasses of wine per week     Comment: per month  . Drug Use: No  . Sexual Activity: No   Other Topics Concern  . Not on file   Social History Narrative  . No narrative on file   Family History  Problem Relation Age of Onset  . Heart disease Mother   . Heart disease Father   . COPD Father   . Mental illness Brother   . Hypertension Brother   . Suicidality Brother   . Heart disease Maternal Aunt   . Heart disease Maternal Uncle   . Heart disease Paternal Aunt   . COPD Paternal 33   . Heart disease Paternal Uncle   . Heart disease Maternal Grandmother   . Heart disease Maternal Grandfather   . Heart disease Paternal Grandmother   . Heart disease Paternal Grandfather    Patient Active Problem List   Diagnosis Date Noted  . H/O bone density study 07/09/2013  . Hyperlipidemia 06/13/2013  . Abnormal EKG 06/13/2013  . Osteopenia  06/13/2013  . Insomnia 06/09/2013  . Depression 12/02/2012  . Anxiety 12/02/2012  . DJD (degenerative joint disease) 12/02/2012  . GERD (gastroesophageal reflux disease) 12/02/2012  . Eczema 12/02/2012  . Tendinitis of hand 12/02/2012   Current Outpatient Prescriptions on File Prior to Visit  Medication Sig Dispense Refill  . buPROPion (WELLBUTRIN) 75 MG tablet 1/4 tablet daily  3 tablet  0  . Calcium Carbonate-Vit D-Min (CALCIUM 1200 PO) Take 1 tablet by mouth 2 (two) times daily.      . cetirizine (ZYRTEC) 10 MG tablet Take 10 mg by mouth daily.      . clonazePAM (KLONOPIN) 1 MG tablet Take 1/2 or one tablet bid prn  30 tablet  1  . Omega-3 Fatty Acids (FISH OIL) 1000 MG CAPS Take by mouth 3 (three) times daily.      . pantoprazole (PROTONIX) 40 MG tablet Take 1 tablet (40 mg total) by mouth daily.  90 tablet  1  . Probiotic Product (PROBIOTIC DAILY PO) Take by mouth.      . mometasone (ELOCON) 0.1 % ointment Apply topically daily. As needed  No current facility-administered medications on file prior to visit.       Review of Systems    see HPI Objective:   Physical Exam  Physical Exam  Nursing note and vitals reviewed.   REpeat BP  128/78 Constitutional: She is oriented to person, place, and time. She appears well-developed and well-nourished.  HENT:  Head: Normocephalic and atraumatic.  Cardiovascular: Normal rate and regular rhythm. Exam reveals no gallop and no friction rub.  No murmur heard.  Pulmonary/Chest: Breath sounds normal. She has no wheezes. She has no rales.  Neurological: She is alert and oriented to person, place, and time.  Skin: Skin is warm and dry.  Psychiatric: She has a normal mood and affect. Her behavior is normal.        Assessment & Plan:  Elevated BP  Normal now    Hyperlipidemia will check today fasting levels  Further management based on results

## 2014-01-03 ENCOUNTER — Encounter: Payer: Self-pay | Admitting: *Deleted

## 2014-01-16 ENCOUNTER — Other Ambulatory Visit: Payer: Self-pay | Admitting: *Deleted

## 2014-01-16 ENCOUNTER — Telehealth: Payer: Self-pay | Admitting: *Deleted

## 2014-01-16 MED ORDER — CLONAZEPAM 1 MG PO TABS: ORAL_TABLET | ORAL | Status: DC

## 2014-01-16 NOTE — Telephone Encounter (Signed)
Refill request will call in pending approval 

## 2014-01-16 NOTE — Telephone Encounter (Signed)
Stephanie Barnes needs a refill of clonazePAM called into [facility] on [address].

## 2014-01-16 NOTE — Telephone Encounter (Signed)
Called in Klonopin to [facility]

## 2014-01-16 NOTE — Telephone Encounter (Signed)
Ok to call in

## 2014-02-13 ENCOUNTER — Other Ambulatory Visit: Payer: Self-pay | Admitting: *Deleted

## 2014-02-13 NOTE — Telephone Encounter (Signed)
Refill request

## 2014-02-14 MED ORDER — PANTOPRAZOLE SODIUM 40 MG PO TBEC: 40.0000 mg | DELAYED_RELEASE_TABLET | Freq: Every day | ORAL | Status: DC

## 2014-02-20 ENCOUNTER — Telehealth: Payer: Self-pay | Admitting: *Deleted

## 2014-02-20 NOTE — Telephone Encounter (Signed)
Pt called to schedule an appt for her acid reflux. She denied chest pain or discomfort N/V SOB or diaphoresis. Appt made for pt on 02/22/14 and advised pt that if she experienced any of the aforementioned sx to be evaluated at the ER.

## 2014-02-22 ENCOUNTER — Ambulatory Visit (INDEPENDENT_AMBULATORY_CARE_PROVIDER_SITE_OTHER): Payer: Managed Care, Other (non HMO) | Admitting: Internal Medicine

## 2014-02-22 ENCOUNTER — Encounter: Payer: Self-pay | Admitting: Internal Medicine

## 2014-02-22 VITALS — BP 108/74 | HR 67 | Temp 98.2°F | Resp 18 | Wt 135.0 lb

## 2014-02-22 DIAGNOSIS — K219 Gastro-esophageal reflux disease without esophagitis: Secondary | ICD-10-CM

## 2014-02-22 DIAGNOSIS — R1013 Epigastric pain: Secondary | ICD-10-CM

## 2014-02-22 DIAGNOSIS — K3189 Other diseases of stomach and duodenum: Secondary | ICD-10-CM

## 2014-02-22 MED ORDER — DEXLANSOPRAZOLE 30 MG PO CPDR: DELAYED_RELEASE_CAPSULE | ORAL | Status: DC

## 2014-02-22 NOTE — Progress Notes (Signed)
Subjective:    Patient ID: Stephanie Barnes, female    DOB: 05/23/1951, 63 y.o.   MRN: [id number]  HPI  [name] is here for acute visit. She has long standing GERD issues and has been on a PPI for "over 10 years"  [place] - last upper endoscopy about 13 years ago.   Currently taking Protonix delayed release but having burning dyspepsia daily especially at night.  No dysphagia and pt reports she has had esophageal dilation in the past for this.  She cannot recall  Being tested for H pylori.  She does not like to take antibiotics as she reports doxycycline  "burned her throat and esophagus in the past"    No frank blood or dark stools      She tries multiple natural products and has been using more coconut oil in her cooking and is not sure  If this is worsening her symptoms.    No SOB,  No N/V/  No diaphoresis.  She tells me she had a negative cardiac stress test a few years ago and it was negative.   Symptoms last few days very similar to her GERD that she has had over many years.     Allergies  Allergen Reactions  . Aspirin Swelling  . Celebrex [Celecoxib] Anaphylaxis  . Celexa [Citalopram]   . Clindamycin/Lincomycin   . Doxycycline   . Fosamax [Alendronate Sodium]   . Other     Arthritec  . Prednisone    Past Medical History  Diagnosis Date  . Anxiety   . Arthritis   . Depression    Past Surgical History  Procedure Laterality Date  . Tonsillectomy    . Dental surgery      Root cleaning    History   Social History  . Marital Status: Single    Spouse Name: N/A    Number of Children: N/A  . Years of Education: N/A   Occupational History  . Not on file.   Social History Main Topics  . Smoking status: Never Smoker   . Smokeless tobacco: Not on file  . Alcohol Use: 0.6 oz/week    1 Glasses of wine per week     Comment: per month  . Drug Use: No  . Sexual Activity: No   Other Topics Concern  . Not on file   Social History Narrative  . No  narrative on file   Family History  Problem Relation Age of Onset  . Heart disease Mother   . Heart disease Father   . COPD Father   . Mental illness Brother   . Hypertension Brother   . Suicidality Brother   . Heart disease Maternal Aunt   . Heart disease Maternal Uncle   . Heart disease Paternal Aunt   . COPD Paternal 42   . Heart disease Paternal Uncle   . Heart disease Maternal Grandmother   . Heart disease Maternal Grandfather   . Heart disease Paternal Grandmother   . Heart disease Paternal Grandfather    Patient Active Problem List   Diagnosis Date Noted  . H/O bone density study 07/09/2013  . Hyperlipidemia 06/13/2013  . Abnormal EKG 06/13/2013  . Osteopenia 06/13/2013  . Insomnia 06/09/2013  . Depression 12/02/2012  . Anxiety 12/02/2012  . DJD (degenerative joint disease) 12/02/2012  . GERD (gastroesophageal reflux disease) 12/02/2012  . Eczema 12/02/2012  . Tendinitis of hand 12/02/2012   Current Outpatient Prescriptions on File Prior to  Visit  Medication Sig Dispense Refill  . buPROPion (WELLBUTRIN) 75 MG tablet 1/4 tablet daily  3 tablet  0  . Calcium Carbonate-Vit D-Min (CALCIUM 1200 PO) Take 1 tablet by mouth 2 (two) times daily.      . clonazePAM (KLONOPIN) 1 MG tablet Take 1/2 or one tablet bid prn  30 tablet  1  . mometasone (ELOCON) 0.1 % ointment Apply topically daily. As needed      . Omega-3 Fatty Acids (FISH OIL) 1000 MG CAPS Take by mouth 3 (three) times daily.      . pantoprazole (PROTONIX) 40 MG tablet Take 1 tablet (40 mg total) by mouth daily.  90 tablet  1  . Probiotic Product (PROBIOTIC DAILY PO) Take by mouth.      . cetirizine (ZYRTEC) 10 MG tablet Take 10 mg by mouth daily.       No current facility-administered medications on file prior to visit.      Review of Systems    SEE HPI Objective:   Physical Exam Physical Exam  Nursing note and vitals reviewed.  Constitutional: She is oriented to person, place, and time. She appears  well-developed and well-nourished.  HENT:  Head: Normocephalic and atraumatic.  O/P no lesions Cardiovascular: Normal rate and regular rhythm. Exam reveals no gallop and no friction rub.  No murmur heard.  Pulmonary/Chest: Breath sounds normal. She has no wheezes. She has no rales.  Neurological: She is alert and oriented to person, place, and time.  Skin: Skin is warm and dry.  Psychiatric: She has a normal mood and affect. Her behavior is normal.          Assessment & Plan:  GERD / epigastric burning:   Samples of Dexilant given   30 mg daily.  Will refer to GI for upper endoscopy. Pt wishes to hold off on H Pylori testing until speaking with GI  Epigastric pain  EKG today  Sinus Bradycardia  No change from  August  (prior cardiac work up from [place]  Negative )  See me in 3-4 weeks or sooner prn

## 2014-02-22 NOTE — Patient Instructions (Signed)
Will set up referral to GI  Dr. Collene Mares  Get new RX dexilant one daily   See me in 3 weeks or sooner as needed  30 min visit

## 2014-02-23 ENCOUNTER — Telehealth: Payer: Self-pay | Admitting: *Deleted

## 2014-02-23 ENCOUNTER — Other Ambulatory Visit: Payer: Self-pay | Admitting: *Deleted

## 2014-02-23 MED ORDER — CLONAZEPAM 1 MG PO TABS: ORAL_TABLET | ORAL | Status: DC

## 2014-02-23 NOTE — Telephone Encounter (Signed)
Refill request will call in pending approval 

## 2014-02-23 NOTE — Telephone Encounter (Signed)
Refill request

## 2014-02-26 MED ORDER — BUPROPION HCL 75 MG PO TABS: ORAL_TABLET | ORAL | Status: DC

## 2014-02-27 ENCOUNTER — Telehealth: Payer: Self-pay | Admitting: *Deleted

## 2014-02-27 NOTE — Telephone Encounter (Signed)
Pt states that she received samples of 60 mg dexilant and then went to pharmacy for RX and received an RX for 30 mg. Pt states that she has not gotten any relief from any of the medications. Pt states that she has an appt with Dr. Collene Mares

## 2014-02-28 ENCOUNTER — Telehealth: Payer: Self-pay | Admitting: Internal Medicine

## 2014-02-28 NOTE — Telephone Encounter (Signed)
When is her appointment?    She can take two of the 30 mg Dexilant until her appointment with Dr. Collene Mares.  If worsening pain , nausea , vomiting or fever she is to go to urgent care or ER. Tell her to stop coconut oil for now until her evaluation with GI

## 2014-02-28 NOTE — Telephone Encounter (Signed)
Stephanie Barnes  Have pt come in for labs  Get CBC, CMP, amylase  .  When is her appt with GI?

## 2014-03-01 NOTE — Telephone Encounter (Signed)
Notified pt that she can take 2 30mg  dexilant ad to stop coconut oil until speaking with Dr Collene Mares

## 2014-03-02 NOTE — Telephone Encounter (Signed)
duplicate

## 2014-03-02 NOTE — Telephone Encounter (Signed)
Refill request

## 2014-03-15 ENCOUNTER — Ambulatory Visit: Payer: Managed Care, Other (non HMO) | Admitting: Internal Medicine

## 2014-03-15 LAB — HM COLONOSCOPY

## 2014-03-15 LAB — HM SIGMOIDOSCOPY

## 2014-03-16 ENCOUNTER — Telehealth: Payer: Self-pay | Admitting: *Deleted

## 2014-03-16 ENCOUNTER — Other Ambulatory Visit: Payer: Self-pay | Admitting: *Deleted

## 2014-03-16 NOTE — Telephone Encounter (Signed)
error 

## 2014-03-16 NOTE — Telephone Encounter (Signed)
Pt states that she had an endoscopy and nothing was found. Pt states that they advised her that to see an oral surgeon, she has an appt on Monday

## 2014-03-22 ENCOUNTER — Ambulatory Visit (INDEPENDENT_AMBULATORY_CARE_PROVIDER_SITE_OTHER): Payer: Managed Care, Other (non HMO) | Admitting: Internal Medicine

## 2014-03-22 ENCOUNTER — Encounter: Payer: Self-pay | Admitting: Internal Medicine

## 2014-03-22 VITALS — BP 119/82 | HR 68 | Temp 97.9°F | Resp 16 | Ht 60.0 in | Wt 133.0 lb

## 2014-03-22 DIAGNOSIS — D126 Benign neoplasm of colon, unspecified: Secondary | ICD-10-CM

## 2014-03-22 DIAGNOSIS — B370 Candidal stomatitis: Secondary | ICD-10-CM

## 2014-03-22 DIAGNOSIS — K635 Polyp of colon: Secondary | ICD-10-CM

## 2014-03-22 NOTE — Progress Notes (Signed)
Subjective:    Patient ID: Stephanie Barnes, female    DOB: June 07, 1951, 63 y.o.   MRN: [id number]  HPI  Stephanie Barnes is here for follow up.    Since last visit.  She has had both upper and lower endoscopy  See path- chronic gastric inflammation and tubular adenoma.   She is using 30 mg of Dexilant which seems to be controlling her GERD  She also was evaluated by her oral surgeon Iran Planas.  He felt she may have early oral thrush and gave her one dose of Diflucan and she is using Clotrimazole troches for 10 days.  Sheis on a "cleanse" diet trying to rid her gut of bacteria.  Marland Kitchen  Antifungals greatly helping her oral symptoms  Allergies  Allergen Reactions  . Aspirin Swelling  . Celebrex [Celecoxib] Anaphylaxis  . Celexa [Citalopram]   . Clindamycin/Lincomycin   . Doxycycline   . Fosamax [Alendronate Sodium]   . Other     Arthritec  . Prednisone    Past Medical History  Diagnosis Date  . Anxiety   . Arthritis   . Depression    Past Surgical History  Procedure Laterality Date  . Tonsillectomy    . Dental surgery      Root cleaning    History   Social History  . Marital Status: Single    Spouse Name: N/A    Number of Children: N/A  . Years of Education: N/A   Occupational History  . Not on file.   Social History Main Topics  . Smoking status: Never Smoker   . Smokeless tobacco: Not on file  . Alcohol Use: 0.6 oz/week    1 Glasses of wine per week     Comment: per month  . Drug Use: No  . Sexual Activity: No   Other Topics Concern  . Not on file   Social History Narrative  . No narrative on file   Family History  Problem Relation Age of Onset  . Heart disease Mother   . Heart disease Father   . COPD Father   . Mental illness Brother   . Hypertension Brother   . Suicidality Brother   . Heart disease Maternal Aunt   . Heart disease Maternal Uncle   . Heart disease Paternal Aunt   . COPD Paternal 101   . Heart disease Paternal Uncle   . Heart disease  Maternal Grandmother   . Heart disease Maternal Grandfather   . Heart disease Paternal Grandmother   . Heart disease Paternal Grandfather    Patient Active Problem List   Diagnosis Date Noted  . H/O bone density study 07/09/2013  . Hyperlipidemia 06/13/2013  . Abnormal EKG 06/13/2013  . Osteopenia 06/13/2013  . Insomnia 06/09/2013  . Depression 12/02/2012  . Anxiety 12/02/2012  . DJD (degenerative joint disease) 12/02/2012  . GERD (gastroesophageal reflux disease) 12/02/2012  . Eczema 12/02/2012  . Tendinitis of hand 12/02/2012   Current Outpatient Prescriptions on File Prior to Visit  Medication Sig Dispense Refill  . buPROPion (WELLBUTRIN) 75 MG tablet 1/4 tablet daily  30 tablet  0  . Calcium Carbonate-Vit D-Min (CALCIUM 1200 PO) Take 1 tablet by mouth 2 (two) times daily.      . cetirizine (ZYRTEC) 10 MG tablet Take 10 mg by mouth daily.      . clonazePAM (KLONOPIN) 1 MG tablet Take 1/2 or one tablet bid prn  30 tablet  1  . Dexlansoprazole 30 MG capsule Take one  daily  30 capsule  1  . mometasone (ELOCON) 0.1 % ointment Apply topically daily. As needed      . Omega-3 Fatty Acids (FISH OIL) 1000 MG CAPS Take by mouth 3 (three) times daily.      . Probiotic Product (PROBIOTIC DAILY PO) Take by mouth.      . pantoprazole (PROTONIX) 40 MG tablet Take 1 tablet (40 mg total) by mouth daily.  90 tablet  1   No current facility-administered medications on file prior to visit.        Review of Systems See HPI    Objective:   Physical Exam Physical Exam  Nursing note and vitals reviewed.  Constitutional: She is oriented to person, place, and time. She appears well-developed and well-nourished.  HENT:  Head: Normocephalic and atraumatic. O/P  I see no evidence of oral thrush on todays exam Cardiovascular: Normal rate and regular rhythm. Exam reveals no gallop and no friction rub.  No murmur heard.  Pulmonary/Chest: Breath sounds normal. She has no wheezes. She has no  rales.  Neurological: She is alert and oriented to person, place, and time.  Skin: Skin is warm and dry.  Psychiatric: She has a normal mood and affect. Her behavior is normal.         Assessment & Plan:  GAstritis / GERD   Continue DExilant  Colon tubular adenoma   Oral thrush no evidence on todays exam  Continue Mycelex

## 2014-03-22 NOTE — Patient Instructions (Signed)
See me as needed 

## 2014-03-27 ENCOUNTER — Encounter: Payer: Self-pay | Admitting: *Deleted

## 2014-03-31 ENCOUNTER — Encounter: Payer: Self-pay | Admitting: *Deleted

## 2014-03-31 HISTORY — PX: COLONOSCOPY: SHX174

## 2014-04-17 ENCOUNTER — Other Ambulatory Visit: Payer: Self-pay | Admitting: *Deleted

## 2014-04-17 ENCOUNTER — Telehealth: Payer: Self-pay | Admitting: *Deleted

## 2014-04-17 NOTE — Telephone Encounter (Signed)
Stephanie Barnes needs her Dexalant sent to [facility] 90 day supply.

## 2014-04-17 NOTE — Telephone Encounter (Signed)
Refill request pt requesting 90 day supply

## 2014-04-18 ENCOUNTER — Other Ambulatory Visit: Payer: Self-pay | Admitting: *Deleted

## 2014-04-18 MED ORDER — DEXLANSOPRAZOLE 30 MG PO CPDR: DELAYED_RELEASE_CAPSULE | ORAL | Status: DC

## 2014-04-24 NOTE — Telephone Encounter (Signed)
Pt sent email stating "Message Body: > [facility] contacted me today. They have a question. Regarding the script you submitted on my behalf for Dexilant 30 mg. 1/day for 90 days. If you don't call them by June 16 th, they will cancel the script request. I am currently OUT. > > 1. Can I get 10 day local supply called in to > [facility] on [address], asap? > > 2. Can you please call [facility] at [phone], regarding reference # [id number] > > 3. No one can reach you by telephone. I have Suezanne Jacquet trying for over a week. They also have been trying. Are you still in business? No I need to find a new doctor? Please advise me. Other script refills will be coming up soon!!!"  I have called [facility] and they have order for Dexilant that was sent on 04/18/14 and it will be mailed out to patient ASAP. I also called [facility] as requested by patient and called in [place] 30mg   #30 no refills. I called patient back to adv 90 day supply should be coming from [facility] and 30 day supply has been called into [facility] for her to pick up. Pt expressed understanding

## 2014-05-16 ENCOUNTER — Other Ambulatory Visit: Payer: Self-pay | Admitting: *Deleted

## 2014-05-16 DIAGNOSIS — Z139 Encounter for screening, unspecified: Secondary | ICD-10-CM

## 2014-05-16 DIAGNOSIS — F419 Anxiety disorder, unspecified: Secondary | ICD-10-CM

## 2014-05-16 MED ORDER — CLONAZEPAM 1 MG PO TABS: ORAL_TABLET | ORAL | Status: DC

## 2014-05-16 NOTE — Telephone Encounter (Signed)
Pt called in and made appt for CPE and needs refill of Klonopin. I will call in [place]. Appt has been made and lab orders will be printed for pt to pick up about a week before CPE is scheduled.

## 2014-06-15 ENCOUNTER — Other Ambulatory Visit: Payer: Self-pay | Admitting: Internal Medicine

## 2014-06-16 ENCOUNTER — Other Ambulatory Visit: Payer: Self-pay | Admitting: *Deleted

## 2014-06-16 DIAGNOSIS — F419 Anxiety disorder, unspecified: Secondary | ICD-10-CM

## 2014-06-16 NOTE — Telephone Encounter (Signed)
Pt called in requesting refill on KLONOPIN.Marland KitchenLast refill 05/16/14 & last office visit 03/22/14.

## 2014-06-16 NOTE — Telephone Encounter (Signed)
Verbal called in for refill per Dr.Schoenhoff

## 2014-06-18 MED ORDER — CLONAZEPAM 1 MG PO TABS: ORAL_TABLET | ORAL | Status: DC

## 2014-07-19 ENCOUNTER — Telehealth: Payer: Self-pay

## 2014-07-19 DIAGNOSIS — F419 Anxiety disorder, unspecified: Secondary | ICD-10-CM

## 2014-07-19 MED ORDER — CLONAZEPAM 1 MG PO TABS: ORAL_TABLET | ORAL | Status: DC

## 2014-07-19 NOTE — Telephone Encounter (Signed)
Stephanie Barnes [phone] [place] called and needs her clonazePAM (KLONOPIN) 1 MG tablet

## 2014-07-19 NOTE — Telephone Encounter (Signed)
RX for [employer] into [facility]

## 2014-07-19 NOTE — Telephone Encounter (Signed)
Refill request for Klonopin 

## 2014-07-21 ENCOUNTER — Encounter: Payer: Self-pay | Admitting: Internal Medicine

## 2014-07-23 ENCOUNTER — Other Ambulatory Visit: Payer: Self-pay | Admitting: Internal Medicine

## 2014-07-24 ENCOUNTER — Other Ambulatory Visit: Payer: Self-pay | Admitting: *Deleted

## 2014-07-24 DIAGNOSIS — Z139 Encounter for screening, unspecified: Secondary | ICD-10-CM

## 2014-07-24 LAB — CBC WITH DIFFERENTIAL/PLATELET
BASOS ABS: 0 10*3/uL (ref 0.0–0.1)
BASOS PCT: 0 % (ref 0–1)
Eosinophils Absolute: 0.1 10*3/uL (ref 0.0–0.7)
Eosinophils Relative: 2 % (ref 0–5)
HCT: 39 % (ref 36.0–46.0)
HEMOGLOBIN: 13.2 g/dL (ref 12.0–15.0)
Lymphocytes Relative: 37 % (ref 12–46)
Lymphs Abs: 1.8 10*3/uL (ref 0.7–4.0)
MCH: 29.5 pg (ref 26.0–34.0)
MCHC: 33.8 g/dL (ref 30.0–36.0)
MCV: 87.1 fL (ref 78.0–100.0)
Monocytes Absolute: 0.4 10*3/uL (ref 0.1–1.0)
Monocytes Relative: 8 % (ref 3–12)
NEUTROS ABS: 2.5 10*3/uL (ref 1.7–7.7)
NEUTROS PCT: 53 % (ref 43–77)
Platelets: 252 10*3/uL (ref 150–400)
RBC: 4.48 MIL/uL (ref 3.87–5.11)
RDW: 14.3 % (ref 11.5–15.5)
WBC: 4.8 10*3/uL (ref 4.0–10.5)

## 2014-07-24 LAB — COMPREHENSIVE METABOLIC PANEL
ALBUMIN: 4.3 g/dL (ref 3.5–5.2)
ALT: 12 U/L (ref 0–35)
AST: 15 U/L (ref 0–37)
Alkaline Phosphatase: 55 U/L (ref 39–117)
BUN: 13 mg/dL (ref 6–23)
CALCIUM: 9.4 mg/dL (ref 8.4–10.5)
CHLORIDE: 102 meq/L (ref 96–112)
CO2: 26 meq/L (ref 19–32)
Creat: 0.76 mg/dL (ref 0.50–1.10)
GLUCOSE: 94 mg/dL (ref 70–99)
POTASSIUM: 4.5 meq/L (ref 3.5–5.3)
Sodium: 138 mEq/L (ref 135–145)
Total Bilirubin: 0.5 mg/dL (ref 0.2–1.2)
Total Protein: 6.8 g/dL (ref 6.0–8.3)

## 2014-07-24 LAB — TSH: TSH: 4.446 u[IU]/mL (ref 0.350–4.500)

## 2014-07-24 LAB — LIPID PANEL
CHOLESTEROL: 202 mg/dL — AB (ref 0–200)
HDL: 87 mg/dL (ref >39)
LDL Cholesterol: 101 mg/dL — ABNORMAL HIGH (ref 0–99)
Total CHOL/HDL Ratio: 2.3 Ratio
Triglycerides: 70 mg/dL (ref <150)
VLDL: 14 mg/dL (ref 0–40)

## 2014-07-24 MED ORDER — DEXLANSOPRAZOLE 30 MG PO CPDR
DELAYED_RELEASE_CAPSULE | ORAL | Status: DC
Start: 2014-07-24 — End: 2015-03-15

## 2014-07-24 NOTE — Telephone Encounter (Signed)
Refill request

## 2014-07-25 ENCOUNTER — Encounter: Payer: Self-pay | Admitting: Internal Medicine

## 2014-07-25 LAB — VITAMIN D 25 HYDROXY (VIT D DEFICIENCY, FRACTURES): Vit D, 25-Hydroxy: 62 ng/mL (ref 30–89)

## 2014-07-26 NOTE — Telephone Encounter (Signed)
[facility] [phone]   Onie returned a call this morning, she said sorry she just missed you

## 2014-07-31 ENCOUNTER — Ambulatory Visit (INDEPENDENT_AMBULATORY_CARE_PROVIDER_SITE_OTHER): Payer: Managed Care, Other (non HMO) | Admitting: Internal Medicine

## 2014-07-31 ENCOUNTER — Encounter: Payer: Self-pay | Admitting: Internal Medicine

## 2014-07-31 ENCOUNTER — Other Ambulatory Visit: Payer: Self-pay | Admitting: Internal Medicine

## 2014-07-31 VITALS — BP 111/68 | HR 80 | Temp 98.1°F | Resp 16 | Ht 59.5 in | Wt 132.0 lb

## 2014-07-31 DIAGNOSIS — F32A Depression, unspecified: Secondary | ICD-10-CM

## 2014-07-31 DIAGNOSIS — F411 Generalized anxiety disorder: Secondary | ICD-10-CM

## 2014-07-31 DIAGNOSIS — Z1211 Encounter for screening for malignant neoplasm of colon: Secondary | ICD-10-CM

## 2014-07-31 DIAGNOSIS — F419 Anxiety disorder, unspecified: Secondary | ICD-10-CM

## 2014-07-31 DIAGNOSIS — Z0000 Encounter for general adult medical examination without abnormal findings: Secondary | ICD-10-CM

## 2014-07-31 DIAGNOSIS — K219 Gastro-esophageal reflux disease without esophagitis: Secondary | ICD-10-CM

## 2014-07-31 DIAGNOSIS — Z1231 Encounter for screening mammogram for malignant neoplasm of breast: Secondary | ICD-10-CM

## 2014-07-31 DIAGNOSIS — Z23 Encounter for immunization: Secondary | ICD-10-CM

## 2014-07-31 DIAGNOSIS — G4700 Insomnia, unspecified: Secondary | ICD-10-CM

## 2014-07-31 DIAGNOSIS — F329 Major depressive disorder, single episode, unspecified: Secondary | ICD-10-CM

## 2014-07-31 DIAGNOSIS — F3289 Other specified depressive episodes: Secondary | ICD-10-CM

## 2014-07-31 DIAGNOSIS — E785 Hyperlipidemia, unspecified: Secondary | ICD-10-CM

## 2014-07-31 LAB — HEMOCCULT GUIAC POC 1CARD (OFFICE): FECAL OCCULT BLD: NEGATIVE

## 2014-07-31 LAB — POCT URINALYSIS DIPSTICK
Bilirubin, UA: NEGATIVE
Blood, UA: NEGATIVE
Glucose, UA: NEGATIVE
Ketones, UA: NEGATIVE
LEUKOCYTES UA: NEGATIVE
Nitrite, UA: NEGATIVE
PROTEIN UA: NEGATIVE
SPEC GRAV UA: 1.015
UROBILINOGEN UA: NEGATIVE
pH, UA: 6.5

## 2014-07-31 MED ORDER — PANTOPRAZOLE SODIUM 40 MG PO TBEC: 40.0000 mg | DELAYED_RELEASE_TABLET | Freq: Every day | ORAL | Status: DC

## 2014-07-31 NOTE — Patient Instructions (Signed)
Give pt Dr. Baron Hamper phone number for pt to make appt  Will schedule 3D mammgram at [facility]

## 2014-07-31 NOTE — Progress Notes (Signed)
Subjective:    Patient ID: Stephanie Barnes, female    DOB: 08/20/1951, 63 y.o.   MRN: [id number]  HPI Last OV  GAstritis / GERD Continue DExilant  Colon tubular adenoma  Oral thrush no evidence on todays exam Continue   [name] is here for CPE  HM:  She is due for mm,  Pap due 2017,  She is a non-smoker  .  Colonoscopy done Dr. Collene Mares 03/2014  Horris Latino reports she would like to go back on Protonix (was given Dexilant  By her GI MD    It is less expensive for her   No chest pain no SOB    Insomnia/anxiety  :  Uses clonazepam - has been on this long term  and this does well for her  She tells me she is taking 4000 mg of calcium daily as she is trying to help her bones      Allergies  Allergen Reactions  . Aspirin Swelling  . Celebrex [Celecoxib] Anaphylaxis  . Celexa [Citalopram]   . Clindamycin/Lincomycin   . Doxycycline   . Fosamax [Alendronate Sodium]   . Other     Arthritec  . Prednisone    Past Medical History  Diagnosis Date  . Anxiety   . Arthritis   . Depression    Past Surgical History  Procedure Laterality Date  . Tonsillectomy    . Dental surgery      Root cleaning    History   Social History  . Marital Status: Single    Spouse Name: N/A    Number of Children: N/A  . Years of Education: N/A   Occupational History  . Not on file.   Social History Main Topics  . Smoking status: Never Smoker   . Smokeless tobacco: Not on file  . Alcohol Use: 0.6 oz/week    1 Glasses of wine per week     Comment: per month  . Drug Use: No  . Sexual Activity: No   Other Topics Concern  . Not on file   Social History Narrative  . No narrative on file   Family History  Problem Relation Age of Onset  . Heart disease Mother   . Heart disease Father   . COPD Father   . Mental illness Brother   . Hypertension Brother   . Suicidality Brother   . Heart disease Maternal Aunt   . Heart disease Maternal Uncle   . Heart disease Paternal Aunt   . COPD Paternal  85   . Heart disease Paternal Uncle   . Heart disease Maternal Grandmother   . Heart disease Maternal Grandfather   . Heart disease Paternal Grandmother   . Heart disease Paternal Grandfather    Patient Active Problem List   Diagnosis Date Noted  . Colon polyp   tubular adenoma  03/2014 03/22/2014  . Oral thrush 03/22/2014  . H/O bone density study 07/09/2013  . Hyperlipidemia 06/13/2013  . Abnormal EKG 06/13/2013  . Osteopenia 06/13/2013  . Insomnia 06/09/2013  . Depression 12/02/2012  . Anxiety 12/02/2012  . DJD (degenerative joint disease) 12/02/2012  . GERD (gastroesophageal reflux disease) 12/02/2012  . Eczema 12/02/2012  . Tendinitis of hand 12/02/2012   Current Outpatient Prescriptions on File Prior to Visit  Medication Sig Dispense Refill  . buPROPion (WELLBUTRIN) 75 MG tablet 1/4 tablet daily  30 tablet  0  . Calcium Carbonate-Vit D-Min (CALCIUM 1200 PO) Take 1 tablet by mouth 2 (two) times daily.      Marland Kitchen  cetirizine (ZYRTEC) 10 MG tablet Take 10 mg by mouth daily.      . clonazePAM (KLONOPIN) 1 MG tablet Take 1/2 or one tablet bid prn  30 tablet  1  . Dexlansoprazole 30 MG capsule Take one daily  90 capsule  1  . LONGS CLOTRIMAZOLE EX Take 10 mg by mouth. 6 times a day      . mometasone (ELOCON) 0.1 % ointment Apply topically daily. As needed      . Omega-3 Fatty Acids (FISH OIL) 1000 MG CAPS Take by mouth 3 (three) times daily.      . pantoprazole (PROTONIX) 40 MG tablet Take 1 tablet (40 mg total) by mouth daily.  90 tablet  1  . Probiotic Product (PROBIOTIC DAILY PO) Take by mouth.       No current facility-administered medications on file prior to visit.       Review of Systems     Objective:   Physical Exam Physical Exam  Nursing note and vitals reviewed.  Constitutional: She is oriented to person, place, and time. She appears well-developed and well-nourished.  HENT:  Head: Normocephalic and atraumatic.  Right Ear: Tympanic membrane and ear canal  normal. No drainage. Tympanic membrane is not injected and not erythematous.  Left Ear: Tympanic membrane and ear canal normal. No drainage. Tympanic membrane is not injected and not erythematous.  Nose: Nose normal. Right sinus exhibits no maxillary sinus tenderness and no frontal sinus tenderness. Left sinus exhibits no maxillary sinus tenderness and no frontal sinus tenderness.  Mouth/Throat: Oropharynx is clear and moist. No oral lesions. No oropharyngeal exudate.  Eyes: Conjunctivae and EOM are normal. Pupils are equal, round, and reactive to light.  Neck: Normal range of motion. Neck supple. No JVD present. Carotid bruit is not present. No mass and no thyromegaly present.  Cardiovascular: Normal rate, regular rhythm, S1 normal, S2 normal and intact distal pulses. Exam reveals no gallop and no friction rub.  No murmur heard.  Pulses:  Carotid pulses are 2+ on the right side, and 2+ on the left side.  Dorsalis pedis pulses are 2+ on the right side, and 2+ on the left side.  No carotid bruit. No LE edema  Pulmonary/Chest: Breath sounds normal. She has no wheezes. She has no rales. She exhibits no tenderness.   Breast no discrete mass no nipple discharge no axillary adenopathy bilaterlly  Abdominal: Soft. Bowel sounds are normal. She exhibits no distension and no mass. There is no hepatosplenomegaly. There is no tenderness. There is no CVA tenderness.   REctal no mass guaiac neg  Musculoskeletal: Normal range of motion.  No active synovitis to joints.  Lymphadenopathy:  She has no cervical adenopathy.  She has no axillary adenopathy.  Right: No inguinal and no supraclavicular adenopathy present.  Left: No inguinal and no supraclavicular adenopathy present.  Neurological: She is alert and oriented to person, place, and time. She has normal strength and normal reflexes. She displays no tremor. No cranial nerve deficit or sensory deficit. Coordination and gait normal.  Skin: Skin is warm and  dry. No rash noted. No cyanosis. Nails show no clubbing.  Psychiatric: She has a normal mood and affect. Her speech is normal and behavior is normal. Cognition and memory are normal.          Assessment & Plan:  HM:  Will schedule 3d mm,  Dexa due in 2016  Pt is a non-smoker advised lung cancer screening guideline  GERD ok to go  back on Protonix   Anxiety Maralyn Sago :  Florien for clonazepam   Hyperlipidemia  Labs look great    Flu vaccine today   See  Me as needed

## 2014-08-03 ENCOUNTER — Encounter: Payer: Self-pay | Admitting: *Deleted

## 2014-08-15 ENCOUNTER — Telehealth: Payer: Self-pay | Admitting: *Deleted

## 2014-08-15 NOTE — Telephone Encounter (Signed)
I spoke with Stephanie Barnes and let her know that we could not call in a Z-Pak without seeingher. I again recommended an urgent care close by to her. -eh

## 2014-08-15 NOTE — Telephone Encounter (Signed)
Stephanie Barnes called and said that she is leaving to go out of town for a funeral however she is sick with a sinus infection and can not come in due to the funeral. I recommended urgent care but she requested that I ask you for a Z-Pak. She says that she gets this every year and you give you a Z-Pak.-eh

## 2014-08-15 NOTE — Telephone Encounter (Signed)
i do not give any antibiotics without being seen

## 2014-08-24 ENCOUNTER — Other Ambulatory Visit: Payer: Self-pay | Admitting: Internal Medicine

## 2014-08-24 ENCOUNTER — Ambulatory Visit: Admission: RE | Admit: 2014-08-24 | Payer: Managed Care, Other (non HMO) | Source: Ambulatory Visit

## 2014-08-24 ENCOUNTER — Ambulatory Visit
Admission: RE | Admit: 2014-08-24 | Discharge: 2014-08-24 | Disposition: A | Discharge status: Home / Self Care | Payer: Managed Care, Other (non HMO) | Source: Ambulatory Visit | Attending: Internal Medicine | Admitting: Internal Medicine

## 2014-08-24 DIAGNOSIS — Z1231 Encounter for screening mammogram for malignant neoplasm of breast: Secondary | ICD-10-CM

## 2014-09-11 ENCOUNTER — Encounter: Payer: Self-pay | Admitting: Internal Medicine

## 2014-09-20 ENCOUNTER — Other Ambulatory Visit: Payer: Self-pay | Admitting: *Deleted

## 2014-09-20 DIAGNOSIS — F419 Anxiety disorder, unspecified: Secondary | ICD-10-CM

## 2014-09-20 NOTE — Telephone Encounter (Signed)
Refill request

## 2014-09-21 ENCOUNTER — Other Ambulatory Visit: Payer: Self-pay | Admitting: Internal Medicine

## 2014-09-21 MED ORDER — CLONAZEPAM 1 MG PO TABS: ORAL_TABLET | ORAL | Status: DC

## 2014-09-21 NOTE — Telephone Encounter (Signed)
Refill request

## 2014-09-21 NOTE — Telephone Encounter (Signed)
RX called into [facility]

## 2014-11-20 ENCOUNTER — Other Ambulatory Visit: Payer: Self-pay | Admitting: *Deleted

## 2014-11-20 DIAGNOSIS — F419 Anxiety disorder, unspecified: Secondary | ICD-10-CM

## 2014-11-20 MED ORDER — CLONAZEPAM 1 MG PO TABS: ORAL_TABLET | ORAL | Status: DC

## 2014-11-20 NOTE — Telephone Encounter (Signed)
Refill request

## 2014-11-20 NOTE — Telephone Encounter (Signed)
R/X phoned in.

## 2014-12-07 ENCOUNTER — Encounter: Payer: Self-pay | Admitting: Internal Medicine

## 2014-12-08 ENCOUNTER — Other Ambulatory Visit: Payer: Self-pay | Admitting: *Deleted

## 2014-12-08 MED ORDER — MOMETASONE FUROATE 0.1 % EX OINT: TOPICAL_OINTMENT | Freq: Every day | CUTANEOUS | Status: DC

## 2014-12-08 NOTE — Telephone Encounter (Signed)
Refill request

## 2014-12-12 ENCOUNTER — Other Ambulatory Visit: Payer: Self-pay | Admitting: Internal Medicine

## 2015-01-17 ENCOUNTER — Other Ambulatory Visit: Payer: Self-pay | Admitting: *Deleted

## 2015-01-17 DIAGNOSIS — F419 Anxiety disorder, unspecified: Secondary | ICD-10-CM

## 2015-01-17 NOTE — Telephone Encounter (Signed)
Refill request

## 2015-01-19 MED ORDER — CLONAZEPAM 1 MG PO TABS
ORAL_TABLET | ORAL | Status: DC
Start: 2015-01-19 — End: 2015-03-14

## 2015-01-19 NOTE — Telephone Encounter (Signed)
RX called in-eh

## 2015-03-14 ENCOUNTER — Encounter: Payer: Self-pay | Admitting: *Deleted

## 2015-03-14 ENCOUNTER — Telehealth: Payer: Self-pay | Admitting: *Deleted

## 2015-03-14 NOTE — Telephone Encounter (Signed)
Pre-Visit Call completed with patient and chart updated.   Pre-Visit Info documented in Specialty Comments under [employer].    

## 2015-03-15 ENCOUNTER — Encounter: Payer: Self-pay | Admitting: Family Medicine

## 2015-03-15 ENCOUNTER — Ambulatory Visit (INDEPENDENT_AMBULATORY_CARE_PROVIDER_SITE_OTHER): Payer: Managed Care, Other (non HMO) | Admitting: Family Medicine

## 2015-03-15 VITALS — BP 106/72 | HR 70 | Temp 98.3°F | Resp 16 | Ht 59.5 in | Wt 133.0 lb

## 2015-03-15 DIAGNOSIS — Z0000 Encounter for general adult medical examination without abnormal findings: Secondary | ICD-10-CM

## 2015-03-15 DIAGNOSIS — K219 Gastro-esophageal reflux disease without esophagitis: Secondary | ICD-10-CM | POA: Diagnosis not present

## 2015-03-15 DIAGNOSIS — E782 Mixed hyperlipidemia: Secondary | ICD-10-CM | POA: Diagnosis not present

## 2015-03-15 DIAGNOSIS — G4700 Insomnia, unspecified: Secondary | ICD-10-CM

## 2015-03-15 DIAGNOSIS — M8580 Other specified disorders of bone density and structure, unspecified site: Secondary | ICD-10-CM

## 2015-03-15 DIAGNOSIS — F418 Other specified anxiety disorders: Secondary | ICD-10-CM

## 2015-03-15 DIAGNOSIS — E785 Hyperlipidemia, unspecified: Secondary | ICD-10-CM

## 2015-03-15 MED ORDER — MOMETASONE FUROATE 0.1 % EX OINT: TOPICAL_OINTMENT | Freq: Every day | CUTANEOUS | Status: DC

## 2015-03-15 MED ORDER — CLONAZEPAM 1 MG PO TABS: 0.5000 mg | ORAL_TABLET | Freq: Two times a day (BID) | ORAL | Status: DC | PRN

## 2015-03-15 MED ORDER — PANTOPRAZOLE SODIUM 40 MG PO TBEC: 40.0000 mg | DELAYED_RELEASE_TABLET | Freq: Every day | ORAL | Status: DC

## 2015-03-15 NOTE — Patient Instructions (Addendum)
NOW company multistrain order at [email or web address]  Cholesterol Cholesterol is a white, waxy, fat-like substance needed by your body in small amounts. The liver makes all the cholesterol you need. Cholesterol is carried from the liver by the blood through the blood vessels. Deposits of cholesterol (plaque) may build up on blood vessel walls. These make the arteries narrower and stiffer. Cholesterol plaques increase the risk for heart attack and stroke.  You cannot feel your cholesterol level even if it is very high. The only way to know it is high is with a blood test. Once you know your cholesterol levels, you should keep a record of the test results. Work with your health care provider to keep your levels in the desired range.  WHAT DO THE RESULTS MEAN?  Total cholesterol is a rough measure of all the cholesterol in your blood.   LDL is the so-called bad cholesterol. This is the type that deposits cholesterol in the walls of the arteries. You want this level to be low.   HDL is the good cholesterol because it cleans the arteries and carries the LDL away. You want this level to be high.  Triglycerides are fat that the body can either burn for energy or store. High levels are closely linked to heart disease.  WHAT ARE THE DESIRED LEVELS OF CHOLESTEROL?  Total cholesterol below 200.   LDL below 100 for people at risk, below 70 for those at very high risk.   HDL above 50 is good, above 60 is best.   Triglycerides below 150.  HOW CAN I LOWER MY CHOLESTEROL?  Diet. Follow your diet programs as directed by your health care provider.   Choose fish or white meat chicken and [place], roasted or baked. Limit fatty cuts of red meat, fried foods, and processed meats, such as sausage and lunch meats.   Eat lots of fresh fruits and vegetables.  Choose whole grains, beans, pasta, potatoes, and cereals.   Use only small amounts of olive, corn, or canola oils.   Avoid butter, mayonnaise,  shortening, or palm kernel oils.  Avoid foods with trans fats.   Drink skim or nonfat milk and eat low-fat or nonfat yogurt and cheeses. Avoid whole milk, cream, ice cream, egg yolks, and full-fat cheeses.   Healthy desserts include angel food cake, ginger snaps, animal crackers, hard candy, popsicles, and low-fat or nonfat frozen yogurt. Avoid pastries, cakes, pies, and cookies.   Exercise. Follow your exercise programs as directed by your health care provider.   A regular program helps decrease LDL and raise HDL.   A regular program helps with weight control.   Do things that increase your activity level like gardening, walking, or taking the stairs. Ask your health care provider about how you can be more active in your daily life.   Medicine. Take medicine only as directed by your health care provider.   Medicine may be prescribed by your health care provider to help lower cholesterol and decrease the risk for heart disease.   If you have several risk factors, you may need medicine even if your levels are normal. Document Released: 07/22/2001 Document Revised: 03/13/2014 Document Reviewed: 08/10/2013 [facility] Patient Information 2015 [place], [place]. This information is not intended to replace advice given to you by your health care provider. Make sure you discuss any questions you have with your health care provider.

## 2015-03-15 NOTE — Progress Notes (Signed)
Stephanie Barnes  [id number] [birth year] 03/15/2015      Progress Note-Follow Up  Subjective  Chief Complaint  Chief Complaint  Patient presents with  . Establish Care    HPI  Patient is a 64 y.o. female in today for routine medical care. Patient is in today to establish care. She reports a contrast 22 pound weight loss in the last 20 years. She is trying to eat exercise. No recent illness. Does struggle with some arthritis but no recent flares. Denies CP/palp/SOB/HA/congestion/fevers/GI or GU c/o. Taking meds as prescribed  Past Medical History  Diagnosis Date  . Anxiety   . Arthritis   . Depression     Past Surgical History  Procedure Laterality Date  . Tonsillectomy    . Dental surgery      Root cleaning   . Refractive surgery Bilateral 2000    Family History  Problem Relation Age of Onset  . Heart disease Mother   . Arthritis Mother   . Heart disease Father   . COPD Father   . Mental illness Brother   . Hypertension Brother   . Suicidality Brother   . Heart disease Maternal Aunt   . Heart disease Maternal Uncle   . Heart disease Paternal Aunt   . COPD Paternal 51   . Heart disease Paternal Uncle   . Heart disease Maternal Grandmother   . Heart disease Maternal Grandfather   . Heart disease Paternal Grandmother   . Heart disease Paternal Grandfather     History   Social History  . Marital Status: Single    Spouse Name: N/A  . Number of Children: N/A  . Years of Education: N/A   Occupational History  . Not on file.   Social History Main Topics  . Smoking status: Never Smoker   . Smokeless tobacco: Never Used  . Alcohol Use: 0.6 oz/week    1 Glasses of wine per week     Comment: per month  . Drug Use: No  . Sexual Activity: No   Other Topics Concern  . Not on file   Social History Narrative    Current Outpatient Prescriptions on File Prior to Visit  Medication Sig Dispense Refill  . buPROPion (WELLBUTRIN) 75 MG tablet 1/4 tablet daily  30 tablet 0  . Calcium Carbonate-Vit D-Min (CALCIUM 1200 PO) Take 1 tablet by mouth 2 (two) times daily.    . cetirizine (ZYRTEC) 10 MG tablet Take 10 mg by mouth daily.    . clonazePAM (KLONOPIN) 1 MG tablet TAKE ONE-HALF TO ONE TABLET BY MOUTH TWICE DAILY AS NEEDED 30 tablet 1  . mometasone (ELOCON) 0.1 % ointment Apply topically daily. As needed 15 g 0  . Omega-3 Fatty Acids (FISH OIL) 1000 MG CAPS Take by mouth 3 (three) times daily.    . pantoprazole (PROTONIX) 40 MG tablet Take 1 tablet (40 mg total) by mouth daily. 90 tablet 2  . Probiotic Product (PROBIOTIC DAILY PO) Take by mouth.     No current facility-administered medications on file prior to visit.    Allergies  Allergen Reactions  . Aspirin Swelling  . Celebrex [Celecoxib] Anaphylaxis  . Celexa [Citalopram]   . Clindamycin/Lincomycin   . Doxycycline   . Fosamax [Alendronate Sodium]   . Other     Arthritec  . Prednisone     Review of Systems  Review of Systems  Constitutional: Negative for fever, chills and malaise/fatigue.  HENT: Negative for congestion, hearing loss and nosebleeds.  Eyes: Negative for discharge.  Respiratory: Negative for cough, sputum production, shortness of breath and wheezing.   Cardiovascular: Negative for chest pain, palpitations and leg swelling.  Gastrointestinal: Negative for heartburn, nausea, vomiting, abdominal pain, diarrhea, constipation and blood in stool.  Genitourinary: Negative for dysuria, urgency, frequency and hematuria.  Musculoskeletal: Negative for myalgias, back pain and falls.  Skin: Negative for rash.  Neurological: Negative for dizziness, tremors, sensory change, focal weakness, loss of consciousness, weakness and headaches.  Endo/Heme/Allergies: Negative for polydipsia. Does not bruise/bleed easily.  Psychiatric/Behavioral: Negative for depression and suicidal ideas. The patient is not nervous/anxious and does not have insomnia.     Objective  BP 106/72 mmHg   Pulse 70  Temp(Src) 98.3 F (36.8 C) (Oral)  Resp 16  Ht 4' 11.5" (1.511 m)  Wt 133 lb (60.328 kg)  BMI 26.42 kg/m2  SpO2 98%  Physical Exam  Physical Exam  Lab Results  Component Value Date   TSH 4.446 07/24/2014   Lab Results  Component Value Date   WBC 4.8 07/24/2014   HGB 13.2 07/24/2014   HCT 39.0 07/24/2014   MCV 87.1 07/24/2014   PLT 252 07/24/2014   Lab Results  Component Value Date   CREATININE 0.76 07/24/2014   BUN 13 07/24/2014   NA 138 07/24/2014   K 4.5 07/24/2014   CL 102 07/24/2014   CO2 26 07/24/2014   Lab Results  Component Value Date   ALT 12 07/24/2014   AST 15 07/24/2014   ALKPHOS 55 07/24/2014   BILITOT 0.5 07/24/2014   Lab Results  Component Value Date   CHOL 202* 07/24/2014   Lab Results  Component Value Date   HDL 87 07/24/2014   Lab Results  Component Value Date   LDLCALC 101* 07/24/2014   Lab Results  Component Value Date   TRIG 70 07/24/2014   Lab Results  Component Value Date   CHOLHDL 2.3 07/24/2014     Assessment & Plan  GERD (gastroesophageal reflux disease) Avoid offending foods, start probiotics. Do not eat large meals in late evening and consider raising head of bed.    Osteopenia Encouraged calcium and vitamin D bid and increase exercise.   Hyperlipidemia Encouraged heart healthy diet, increase exercise, avoid trans fats, consider a krill oil cap daily   Insomnia Encouraged good sleep hygiene such as dark, quiet room. No blue/green glowing lights such as computer screens in bedroom. No alcohol or stimulants in evening. Cut down on caffeine as able. Regular exercise is helpful but not just prior to bed time.    Depression with anxiety Tolerating Bupropion

## 2015-03-15 NOTE — Progress Notes (Signed)
Pre visit review using our clinic review tool, if applicable. No additional management support is needed unless otherwise documented below in the visit note. 

## 2015-03-31 ENCOUNTER — Encounter: Payer: Self-pay | Admitting: Family Medicine

## 2015-03-31 NOTE — Assessment & Plan Note (Signed)
Avoid offending foods, start probiotics. Do not eat large meals in late evening and consider raising head of bed.  

## 2015-03-31 NOTE — Assessment & Plan Note (Signed)
Tolerating Bupropion

## 2015-03-31 NOTE — Assessment & Plan Note (Signed)
Encouraged heart healthy diet, increase exercise, avoid trans fats, consider a krill oil cap daily 

## 2015-03-31 NOTE — Assessment & Plan Note (Signed)
Encouraged calcium and vitamin D bid and increase exercise.

## 2015-03-31 NOTE — Assessment & Plan Note (Signed)
Encouraged good sleep hygiene such as dark, quiet room. No blue/green glowing lights such as computer screens in bedroom. No alcohol or stimulants in evening. Cut down on caffeine as able. Regular exercise is helpful but not just prior to bed time.  

## 2015-04-03 ENCOUNTER — Other Ambulatory Visit: Payer: Self-pay | Admitting: Internal Medicine

## 2015-04-04 ENCOUNTER — Encounter: Payer: Self-pay | Admitting: Family Medicine

## 2015-04-05 ENCOUNTER — Other Ambulatory Visit: Payer: Self-pay | Admitting: Family Medicine

## 2015-04-05 MED ORDER — BUPROPION HCL 75 MG PO TABS: ORAL_TABLET | ORAL | Status: DC

## 2015-05-20 ENCOUNTER — Telehealth: Payer: Managed Care, Other (non HMO) | Admitting: Family

## 2015-05-20 DIAGNOSIS — J0120 Acute ethmoidal sinusitis, unspecified: Secondary | ICD-10-CM

## 2015-05-20 MED ORDER — AMOXICILLIN-POT CLAVULANATE 875-125 MG PO TABS: 1.0000 | ORAL_TABLET | Freq: Two times a day (BID) | ORAL | Status: DC

## 2015-05-20 NOTE — Progress Notes (Signed)

## 2015-05-22 ENCOUNTER — Telehealth: Payer: Self-pay | Admitting: Family Medicine

## 2015-05-22 NOTE — Telephone Encounter (Signed)
Requesting: Clonazepam Contract  none UDS  none Last OV  03/15/15 Last Refill  03/15/15  #30 with 1 refill.   Please Advise

## 2015-05-23 ENCOUNTER — Telehealth: Payer: Self-pay | Admitting: Family Medicine

## 2015-05-23 MED ORDER — AZITHROMYCIN 250 MG PO TABS: ORAL_TABLET | ORAL | Status: DC

## 2015-05-23 NOTE — Telephone Encounter (Signed)
Caller name: Annebelle Relation to pt: self Call back number: [phone] Pharmacy: [facility] on [address]  Reason for call:   Patient states that she had an E-visit over the past weekend and was given amoxicillen. She states that the antibiotic is giving her diarrhea and is wanting a z pack called in instead.

## 2015-05-23 NOTE — Telephone Encounter (Signed)
Z-pack sent to [facility] per patient preference.  Patient notified.

## 2015-05-23 NOTE — Telephone Encounter (Signed)
Please advise if OK to send in z-pack.

## 2015-05-23 NOTE — Telephone Encounter (Signed)
OK to d/c Augmentin, OK to start aztihromycin 250 mg tab. 2 tabs po daily once and the 1 tab po daily x 4 days

## 2015-05-23 NOTE — Telephone Encounter (Signed)
Patient states that that pharmacy does not have refill. She states that she is taking last pill tonight.

## 2015-05-24 MED ORDER — CLONAZEPAM 1 MG PO TABS: ORAL_TABLET | ORAL | Status: DC

## 2015-05-24 NOTE — Telephone Encounter (Signed)
Printed and on counter for signature. 

## 2015-05-24 NOTE — Telephone Encounter (Signed)
Advise on refill please.

## 2015-05-24 NOTE — Addendum Note (Signed)
Addended by: Sharon Seller B on: 05/24/2015 01:38 PM   Modules accepted: Orders

## 2015-05-24 NOTE — Telephone Encounter (Signed)
Pt calling again stating that pharmacy does not have refill at [facility] on [address]. Please call her back. [phone].

## 2015-05-24 NOTE — Telephone Encounter (Signed)
Called the patient to inform once signed by PCP will then fax to [facility] [address] [facility]

## 2015-05-24 NOTE — Telephone Encounter (Signed)
OK to refill Klonopin with same strength, sig, same number

## 2015-06-21 ENCOUNTER — Other Ambulatory Visit: Payer: Self-pay | Admitting: Family Medicine

## 2015-06-21 MED ORDER — CLONAZEPAM 1 MG PO TABS: ORAL_TABLET | ORAL | Status: DC

## 2015-06-21 NOTE — Telephone Encounter (Signed)
She can have a refill on the Clonopin if she is requesting it, unclear if she is

## 2015-06-21 NOTE — Telephone Encounter (Signed)
Printed and on counter for refill

## 2015-06-21 NOTE — Telephone Encounter (Signed)
Faxed hardcopy for Clonopin to [facility]. And called the patient to inform

## 2015-06-21 NOTE — Addendum Note (Signed)
Addended by: Sharon Seller B on: 06/21/2015 05:13 PM   Modules accepted: Orders

## 2015-06-21 NOTE — Telephone Encounter (Signed)
Faxed hardcopy for [facility]

## 2015-06-21 NOTE — Telephone Encounter (Signed)
Error

## 2015-06-22 ENCOUNTER — Other Ambulatory Visit: Payer: Self-pay | Admitting: Family Medicine

## 2015-07-09 ENCOUNTER — Other Ambulatory Visit: Payer: Self-pay | Admitting: Family Medicine

## 2015-07-09 ENCOUNTER — Encounter: Payer: Self-pay | Admitting: Family Medicine

## 2015-07-09 MED ORDER — BUPROPION HCL 75 MG PO TABS: ORAL_TABLET | ORAL | Status: DC

## 2015-07-17 ENCOUNTER — Ambulatory Visit (INDEPENDENT_AMBULATORY_CARE_PROVIDER_SITE_OTHER): Payer: Medicare Other | Admitting: Physician Assistant

## 2015-07-17 ENCOUNTER — Encounter: Payer: Self-pay | Admitting: Physician Assistant

## 2015-07-17 VITALS — BP 102/68 | HR 61 | Temp 98.0°F | Resp 16 | Ht 59.0 in | Wt 132.1 lb

## 2015-07-17 DIAGNOSIS — M5010 Cervical disc disorder with radiculopathy, unspecified cervical region: Secondary | ICD-10-CM

## 2015-07-17 MED ORDER — GABAPENTIN 100 MG PO CAPS: 100.0000 mg | ORAL_CAPSULE | Freq: Three times a day (TID) | ORAL | Status: DC

## 2015-07-17 MED ORDER — TRAMADOL HCL 50 MG PO TABS: 50.0000 mg | ORAL_TABLET | Freq: Three times a day (TID) | ORAL | Status: DC | PRN

## 2015-07-17 NOTE — Progress Notes (Signed)
Pre visit review using our clinic review tool, if applicable. No additional management support is needed unless otherwise documented below in the visit note/SLS  

## 2015-07-17 NOTE — Patient Instructions (Signed)
Please keep your phone on as you will be getting a call for an MRI. Please give your new insurance card to the lady at the front desk so they can make copies. Have them give the copy to [place].  Start the Gabapentin taking once daily for 2 days, then twice daily for 2 days, then three times daily.  Use tramadol as directed if needed for severe pain. Limit heavy lifting or overexertion.

## 2015-07-17 NOTE — Progress Notes (Signed)
Patient presents to clinic today c/o neck pain and R shoulder pain with radiation into elbow x 6 weeks. Is followed by Chiropractor who obtained x-rays with evidence of DJD and disc space loss in cervical spine. Was told he felt it was pinched nerves as cause of her symptoms. Patient denies numbness or weakness of arms. Has taken Ibuprofen OTC for pain if needed.  Past Medical History  Diagnosis Date  . Anxiety   . Arthritis   . Depression   . Depression with anxiety 12/02/2012    Current Outpatient Prescriptions on File Prior to Visit  Medication Sig Dispense Refill  . buPROPion (WELLBUTRIN) 75 MG tablet 1/4 tablet daily 30 tablet 6  . Calcium Carbonate-Vit D-Min (CALCIUM 1200 PO) Take 1 tablet by mouth 2 (two) times daily.    . cetirizine (ZYRTEC) 10 MG tablet Take 10 mg by mouth daily as needed.     . clonazePAM (KLONOPIN) 1 MG tablet TAKE ONE-HALF TO ONE TABLET BY MOUTH TWICE DAILY AS NEEDED FOR ANXIETY 30 tablet 0  . mometasone (ELOCON) 0.1 % ointment Apply topically daily. As needed 15 g 0  . Omega-3 Fatty Acids (FISH OIL) 1000 MG CAPS Take by mouth 3 (three) times daily.    . pantoprazole (PROTONIX) 40 MG tablet Take 1 tablet (40 mg total) by mouth daily. 90 tablet 2  . Probiotic Product (PROBIOTIC DAILY PO) Take by mouth.     No current facility-administered medications on file prior to visit.    Allergies  Allergen Reactions  . Aspirin Swelling  . Celebrex [Celecoxib] Anaphylaxis  . Celexa [Citalopram]   . Clindamycin/Lincomycin   . Doxycycline   . Fosamax [Alendronate Sodium]   . Other     Arthritec  . Prednisone     Family History  Problem Relation Age of Onset  . Heart disease Mother   . Arthritis Mother   . Heart disease Father   . COPD Father   . Mental illness Brother     bipolar  . Hypertension Brother   . Suicidality Brother   . Alcohol abuse Brother   . Heart disease Maternal Aunt   . Heart disease Maternal Uncle   . Heart disease Paternal Aunt    . COPD Paternal 87   . Heart disease Paternal Uncle   . Heart disease Maternal Grandmother   . Heart disease Maternal Grandfather   . Heart disease Paternal Grandmother   . Heart disease Paternal Grandfather     Social History   Social History  . Marital Status: Single    Spouse Name: N/A  . Number of Children: N/A  . Years of Education: N/A   Social History Main Topics  . Smoking status: Never Smoker   . Smokeless tobacco: Never Used  . Alcohol Use: 0.6 oz/week    1 Glasses of wine per week     Comment: per month  . Drug Use: No  . Sexual Activity: No     Comment: lives alone, retired from [employer], no dietary restrictions   Other Topics Concern  . None   Social History Narrative   Review of Systems - See HPI.  All other ROS are negative.  BP 102/68 mmHg  Pulse 61  Temp(Src) 98 F (36.7 C) (Oral)  Resp 16  Ht 4\' 11"  (1.499 m)  Wt 132 lb 2 oz (59.932 kg)  BMI 26.67 kg/m2  SpO2 99%  Physical Exam  Constitutional: She is oriented to person, place, and  time and well-developed, well-nourished, and in no distress.  HENT:  Head: Normocephalic and atraumatic.  Eyes: Conjunctivae are normal.  Cardiovascular: Normal rate, regular rhythm, normal heart sounds and intact distal pulses.   Pulmonary/Chest: Effort normal.  Musculoskeletal:       Right shoulder: She exhibits pain. She exhibits normal range of motion, no spasm and normal strength.       Cervical back: She exhibits pain. She exhibits no tenderness and no bony tenderness.  Neurological: She is alert and oriented to person, place, and time.  Skin: Skin is warm and dry. No rash noted.  Psychiatric: Affect normal.  Vitals reviewed.   No results found for this or any previous visit (from the past 2160 hour(s)).  Assessment/Plan: Cervical disc disorder with radiculopathy of cervical region Will proceed with MRI. Will order tomorrow as patient's insurance changes tomorrow when she turns 74.  Intolerant to NSAIDs and prednisone. Will Rx Gabapentin 100 mg and titrate to TID. Tramadol for severe pain. Will refer to Ortho or Neurosurgery based on MRI findings.

## 2015-07-17 NOTE — Assessment & Plan Note (Signed)
Will proceed with MRI. Will order tomorrow as patient's insurance changes tomorrow when she turns 34. Intolerant to NSAIDs and prednisone. Will Rx Gabapentin 100 mg and titrate to TID. Tramadol for severe pain. Will refer to Ortho or Neurosurgery based on MRI findings.

## 2015-07-19 ENCOUNTER — Other Ambulatory Visit: Payer: Self-pay | Admitting: Physician Assistant

## 2015-07-19 DIAGNOSIS — M5090 Cervical disc disorder, unspecified, unspecified cervical region: Secondary | ICD-10-CM

## 2015-07-19 DIAGNOSIS — M5412 Radiculopathy, cervical region: Secondary | ICD-10-CM

## 2015-07-23 ENCOUNTER — Encounter: Payer: Self-pay | Admitting: Family Medicine

## 2015-07-23 ENCOUNTER — Ambulatory Visit (INDEPENDENT_AMBULATORY_CARE_PROVIDER_SITE_OTHER): Payer: Medicare Other

## 2015-07-23 ENCOUNTER — Other Ambulatory Visit: Payer: Self-pay | Admitting: Physician Assistant

## 2015-07-23 DIAGNOSIS — M5090 Cervical disc disorder, unspecified, unspecified cervical region: Secondary | ICD-10-CM | POA: Diagnosis not present

## 2015-07-23 DIAGNOSIS — M5412 Radiculopathy, cervical region: Secondary | ICD-10-CM

## 2015-07-23 DIAGNOSIS — M5023 Other cervical disc displacement, cervicothoracic region: Secondary | ICD-10-CM | POA: Diagnosis not present

## 2015-07-23 DIAGNOSIS — M4802 Spinal stenosis, cervical region: Secondary | ICD-10-CM

## 2015-07-24 ENCOUNTER — Other Ambulatory Visit: Payer: Self-pay | Admitting: Family Medicine

## 2015-07-25 ENCOUNTER — Telehealth: Payer: Self-pay | Admitting: Family Medicine

## 2015-07-25 ENCOUNTER — Other Ambulatory Visit: Payer: Self-pay | Admitting: Family Medicine

## 2015-07-25 NOTE — Telephone Encounter (Signed)
Clonazepam 1 mg  Take 1/2 tablet BID PRN for anxiety Disp 30 R 0  Please advise

## 2015-07-25 NOTE — Telephone Encounter (Signed)
Relation to KS:HNGI Call back number:[phone] Pharmacy: [facility] [place], [place] [phone] (Phone) [phone] (Fax)        Reason for call:  Patient requesting a refill clonazePAM (KLONOPIN) 1 MG tablet

## 2015-07-25 NOTE — Telephone Encounter (Signed)
I think I printed this already.

## 2015-07-25 NOTE — Telephone Encounter (Signed)
Spoke with pt and she voices understanding.  

## 2015-07-26 ENCOUNTER — Encounter: Payer: Self-pay | Admitting: Physician Assistant

## 2015-07-26 DIAGNOSIS — S838X1D Sprain of other specified parts of right knee, subsequent encounter: Secondary | ICD-10-CM

## 2015-07-26 DIAGNOSIS — G8929 Other chronic pain: Secondary | ICD-10-CM | POA: Diagnosis not present

## 2015-07-26 DIAGNOSIS — M25511 Pain in right shoulder: Secondary | ICD-10-CM | POA: Diagnosis not present

## 2015-07-26 DIAGNOSIS — Z6826 Body mass index (BMI) 26.0-26.9, adult: Secondary | ICD-10-CM | POA: Diagnosis not present

## 2015-07-26 DIAGNOSIS — R030 Elevated blood-pressure reading, without diagnosis of hypertension: Secondary | ICD-10-CM | POA: Diagnosis not present

## 2015-07-26 DIAGNOSIS — S46001D Unspecified injury of muscle(s) and tendon(s) of the rotator cuff of right shoulder, subsequent encounter: Secondary | ICD-10-CM

## 2015-07-27 NOTE — Telephone Encounter (Signed)
Medication already filled.

## 2015-07-27 NOTE — Telephone Encounter (Signed)
OK to refill the Clonazepam with same strength, same sig, same number and 1 rf. Her MRI basically has degenerative/arthritis changes throughout cervical spine. Nothing can obviously explain the degree of her symptoms but she could pursue neurosurgical evaluation if she would like or she can come in and talk to me about her concerns instead. Dr Jacinto Reap

## 2015-07-30 ENCOUNTER — Other Ambulatory Visit: Payer: Self-pay | Admitting: Family Medicine

## 2015-07-30 NOTE — Telephone Encounter (Signed)
Faxed hardcopy for clonazepam to [facility]

## 2015-08-06 ENCOUNTER — Telehealth: Payer: Self-pay | Admitting: Physician Assistant

## 2015-08-06 ENCOUNTER — Ambulatory Visit (INDEPENDENT_AMBULATORY_CARE_PROVIDER_SITE_OTHER): Payer: Medicare Other

## 2015-08-06 DIAGNOSIS — M19011 Primary osteoarthritis, right shoulder: Secondary | ICD-10-CM | POA: Diagnosis not present

## 2015-08-06 DIAGNOSIS — M25511 Pain in right shoulder: Principal | ICD-10-CM

## 2015-08-06 DIAGNOSIS — G8929 Other chronic pain: Secondary | ICD-10-CM

## 2015-08-06 DIAGNOSIS — M75101 Unspecified rotator cuff tear or rupture of right shoulder, not specified as traumatic: Secondary | ICD-10-CM

## 2015-08-06 DIAGNOSIS — M7551 Bursitis of right shoulder: Secondary | ICD-10-CM

## 2015-08-06 DIAGNOSIS — M25411 Effusion, right shoulder: Secondary | ICD-10-CM | POA: Diagnosis not present

## 2015-08-06 NOTE — Telephone Encounter (Signed)
Results discussed with patient. Referral to orthopedic surgery placed.

## 2015-08-08 ENCOUNTER — Encounter: Payer: Self-pay | Admitting: Physician Assistant

## 2015-08-13 DIAGNOSIS — M7541 Impingement syndrome of right shoulder: Secondary | ICD-10-CM | POA: Diagnosis not present

## 2015-08-13 DIAGNOSIS — M25511 Pain in right shoulder: Secondary | ICD-10-CM | POA: Diagnosis not present

## 2015-08-15 ENCOUNTER — Ambulatory Visit: Payer: Medicare Other | Attending: Specialist | Admitting: Physical Therapy

## 2015-08-15 DIAGNOSIS — M6281 Muscle weakness (generalized): Secondary | ICD-10-CM | POA: Diagnosis not present

## 2015-08-15 DIAGNOSIS — R293 Abnormal posture: Secondary | ICD-10-CM | POA: Diagnosis not present

## 2015-08-15 DIAGNOSIS — M25611 Stiffness of right shoulder, not elsewhere classified: Secondary | ICD-10-CM | POA: Insufficient documentation

## 2015-08-15 DIAGNOSIS — M25511 Pain in right shoulder: Secondary | ICD-10-CM | POA: Diagnosis not present

## 2015-08-15 NOTE — Therapy (Addendum)
[facility] [address]  [place], [place], [address] Phone: [phone]   Fax:  [phone]  Physical Therapy Evaluation  Patient Details  Name: Terrin Imparato MRN: [id number] Date of Birth: 15-Feb-1951 Referring Provider:  Susa Day, MD  Encounter Date: 08/15/2015      PT End of Session - 08/15/15 1014    Visit Number 1   Number of Visits 16   Date for PT Re-Evaluation 10/10/15   PT Start Time 0102   PT Stop Time 1109   PT Time Calculation (min) 55 min   Activity Tolerance Patient tolerated treatment well;Patient limited by pain   Behavior During Therapy [facility] for tasks assessed/performed      Past Medical History  Diagnosis Date  . Anxiety   . Arthritis   . Depression   . Depression with anxiety 12/02/2012    Past Surgical History  Procedure Laterality Date  . Tonsillectomy    . Dental surgery      Root cleaning   . Refractive surgery Bilateral 2000    There were no vitals filed for this visit.  Visit Diagnosis:  Pain of right shoulder region - Plan: PT plan of care cert/re-cert  Stiffness of right shoulder joint - Plan: PT plan of care cert/re-cert  Muscle right arm weakness - Plan: PT plan of care cert/re-cert  Posture abnormality - Plan: PT plan of care cert/re-cert      Subjective Assessment - 08/15/15 1017    Subjective Patient reports long h/o tendonitis in right UE progressing distal to proximal starting in 1994. Current issues started in July, with pain first noticed with sleep disturbance. Sees chiropracter for low back 1x/month and accupunturist 1x/month. Reported pain to chiropracter who took x-ray indicating possible cervical origin for symptoms but follow up MRI only showed degenerative changes typical for age. MRI  of shoulder revealed slight tear of 2 RTC muscles. Pain restricting ROM and strength in right arm with certain movements casuing pain so severe that it causes patient  to "drop to her knees".   Limitations House hold activities   Diagnostic tests 08/06/15 MRI right shoulder: Rotator cuff tendinopathy/ tendinosis with shallow articular surface tears involving the infraspinatus and supraspinatus tendons but no full thickness retracted tear. Long head biceps tendinopathy. Degenerative fraying type changes involving the superior labrum and anterior labral tears are suspected. No significant findings for bony impingement. Moderate AC joint degenerative changes and mild lateral downsloping of a type 2 acromion. Glenohumeral joint degenerative changes with small joint effusion and mild synovitis. Mild subacromial/subdeltoid bursitis.   Patient Stated Goals "Be able to use my right arm without pain"   Currently in Pain? Yes   Pain Score 3   8/10 earlier this morning while getting dressed; Least 2/10, Avg 4-5/10, Worst 10/10   Pain Location Shoulder   Pain Orientation Right;Anterior;Lateral   Pain Descriptors / Indicators Sharp;Aching   Pain Radiating Towards Occasional radiation down to elbow   Pain Onset More than a month ago  July 2016   Pain Frequency Constant  varies in intensity   Aggravating Factors  movements into abduction & IR/ER   Pain Relieving Factors ice pack, avoiding use of right arm   Effect of Pain on Daily Activities need to work with activities only in front of body            [facility] PT Assessment - 08/15/15 1014    Assessment   Medical Diagnosis Right shoulder impingement  syndrome/adhesive capsulitis   Onset Date/Surgical Date --  July 2016   Hand Dominance Right   Next MD Visit 09/03/15   Prior Therapy none   Balance Screen   Has the patient fallen in the past 6 months No   Has the patient had a decrease in activity level because of a fear of falling?  No   Is the patient reluctant to leave their home because of a fear of falling?  No   Home Ecologist residence   Living Arrangements Alone   Prior  Function   Level of Independence Independent   Vocation Part time employment   Vocation Requirements desk work   Leisure Cooking, golf   Observation/Other Assessments   Focus on Therapeutic Outcomes (FOTO)  49% (51% limitation); Predicted 67% (33% limitation)   Posture/Postural Control   Posture/Postural Control Postural limitations   Postural Limitations Rounded Shoulders;Forward head   ROM / Strength   AROM / PROM / Strength AROM;PROM;Strength   AROM   AROM Assessment Site Shoulder   Right/Left Shoulder Right;Left   Right Shoulder Flexion 132 Degrees   Right Shoulder ABduction 106 Degrees  pain   Right Shoulder External Rotation 58 Degrees   Left Shoulder Flexion 157 Degrees   Left Shoulder ABduction 145 Degrees   Left Shoulder External Rotation 84 Degrees   PROM   PROM Assessment Site Shoulder   Right/Left Shoulder Right   Right Shoulder Flexion 135 Degrees  soft end feel - pain limiting   Right Shoulder Internal Rotation 68 Degrees  supine at ~80 abduction   Right Shoulder External Rotation --  pain preventing assessment   Strength   Overall Strength Comments pain with resisted movements in right shoulder   Strength Assessment Site Shoulder   Right/Left Shoulder Right;Left   Right Shoulder Flexion 3+/5   Right Shoulder ABduction 3+/5   Right Shoulder Internal Rotation 4-/5   Right Shoulder External Rotation 3+/5   Left Shoulder Flexion 4+/5   Left Shoulder ABduction 4+/5   Left Shoulder Internal Rotation 4+/5   Left Shoulder External Rotation 4+/5   Palpation   Palpation comment ttp over long head of biceps tendon and supraspinatus/infraspinatus insertion on humerous   Special Tests    Special Tests Rotator Cuff Impingement   Rotator Cuff Impingment tests Empty Can test   Empty Can test   Findings Positive   Side Right                   [facility] Adult PT Treatment/Exercise - 08/15/15 1014    Exercises   Exercises Shoulder   Shoulder Exercises:  Standing   Row Strengthening;Both;10 reps;Theraband   Theraband Level (Shoulder Row) Level 2 (Red)   Retraction Both;10 reps   Shoulder Exercises: IT sales professional --  attempted but deferred d/t anterior shoulder/biceps pain   Other Shoulder Stretches Right upper trap stretch 3x20"                  PT Short Term Goals - 08/15/15 1135    PT SHORT TERM GOAL #1   Title Indpendent with initial HEP (09/05/15)   Time 3   Period Weeks           PT Long Term Goals - 08/15/15 1135    PT LONG TERM GOAL #1   Title Independent with advanced HEP (10/10/15)   Time 8   Period Weeks   Status New   PT LONG TERM GOAL #  2   Title Patient will demonstrate right shoulder ROM within 15 degrees of left without increased pain for improved functional reach (10/10/15)   Time 8   Period Weeks   Status New   PT LONG TERM GOAL #3   Title Right shoulder strength 4/5 or greater for improved shoulder stability during functional tasks (10/10/15)   Time 8   Period Weeks   Status New   PT LONG TERM GOAL #4   Title Patient will report pain at worst no greater than 5/10 in right shoulder (10/10/15)   Time 8   Period Weeks   Status New   PT LONG TERM GOAL #5   Title Patient will report functional use of right UE during self care ADLs and daily activities (ie. cooking) without increased pain (10/10/15)   Time 8   Period Weeks   Status New               Plan - September 09, 2015 1125    Clinical Impression Statement Patient is a 64 y/o female who presents to OP PT with 3 month h/o right shoulder pain and restricted ROM consistent with RTC/biceps impingment syndrome and adhesive capsulitis. Pain most limiting with activities requiring shoulder abduction and ER/IR. Patient demonstrates forward head and shoulder posture, right > left with right shoulder ROM limited in a capsular pattern. Right shoulder strength limited in all planes with guarding due to pain.   Pt will benefit from skilled  therapeutic intervention in order to improve on the following deficits Pain;Decreased range of motion;Impaired flexibility;Impaired UE functional use;Postural dysfunction;Decreased activity tolerance;Decreased strength   Rehab Potential Good   PT Frequency 2x / week   PT Duration 8 weeks   PT Treatment/Interventions Manual techniques;Passive range of motion;Therapeutic exercise;Therapeutic activities;ADLs/Self Care Home Management;Ultrasound;Electrical Stimulation;Cryotherapy;Vasopneumatic Device;Iontophoresis 4mg /ml Dexamethasone;Patient/family education   PT Next Visit Plan Review HEP; postural training, manual therapy, gentle shoulder ROM/strengthening, modalities PRN   Consulted and Agree with Plan of Care Patient          G-Codes - 09-09-15 1142    Functional Assessment Tool Used FOTO = 49% (51% limitation)   Functional Limitation Carrying, moving and handling objects   Carrying, Moving and Handling Objects Current Status (K0881) At least 40 percent but less than 60 percent impaired, limited or restricted   Carrying, Moving and Handling Objects Goal Status (J0315) At least 20 percent but less than 40 percent impaired, limited or restricted  Predicted FOTO = 67% (33% limitation)       Problem List Patient Active Problem List   Diagnosis Date Noted  . Cervical disc disorder with radiculopathy of cervical region 07/17/2015  . Colon polyp   tubular adenoma  03/2014 03/22/2014  . H/O bone density study 07/09/2013  . Hyperlipidemia 06/13/2013  . Abnormal EKG 06/13/2013  . Osteopenia 06/13/2013  . Insomnia 06/09/2013  . 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 Anxiety 12/02/2012  . DJD (degenerative joint disease) 12/02/2012  . GERD (gastroesophageal reflux disease) 12/02/2012  . Eczema 12/02/2012  . Tendinitis of hand 12/02/2012    Percival Spanish, PT, MPT 09-09-2015, 1:11 PM  [facility] [address]  [place], [place], [address] Phone: [phone]   Fax:  [phone]

## 2015-08-17 ENCOUNTER — Ambulatory Visit: Payer: Medicare Other | Admitting: Physical Therapy

## 2015-08-17 DIAGNOSIS — M25511 Pain in right shoulder: Secondary | ICD-10-CM

## 2015-08-17 DIAGNOSIS — R293 Abnormal posture: Secondary | ICD-10-CM | POA: Diagnosis not present

## 2015-08-17 DIAGNOSIS — M25611 Stiffness of right shoulder, not elsewhere classified: Secondary | ICD-10-CM

## 2015-08-17 DIAGNOSIS — M6281 Muscle weakness (generalized): Secondary | ICD-10-CM | POA: Diagnosis not present

## 2015-08-17 NOTE — Therapy (Signed)
[facility] [address]  [place], [place], [address] Phone: [phone]   Fax:  [phone]  Physical Therapy Treatment  Patient Details  Name: Stephanie Barnes MRN: [id number] Date of Birth: December 25, 1950 Referring Provider:  Susa Day, MD  Encounter Date: 08/17/2015      PT End of Session - 08/17/15 1022    Visit Number 2   Number of Visits 16   Date for PT Re-Evaluation 10/10/15   PT Start Time 4627   PT Stop Time 1107   PT Time Calculation (min) 52 min   Activity Tolerance Patient tolerated treatment well   Behavior During Therapy [facility] for tasks assessed/performed      Past Medical History  Diagnosis Date  . Anxiety   . Arthritis   . Depression   . Depression with anxiety 12/02/2012    Past Surgical History  Procedure Laterality Date  . Tonsillectomy    . Dental surgery      Root cleaning   . Refractive surgery Bilateral 2000    There were no vitals filed for this visit.  Visit Diagnosis:  Pain of right shoulder region  Stiffness of right shoulder joint  Muscle right arm weakness  Posture abnormality      Subjective Assessment - 08/17/15 1017    Subjective Patient reports she had her first accupunture treatment yesterday for her shoulder at which time the therapist used estim on her shoulder reminding her that she has a TENS unit at home. She used her TENS unit at home last night and along with ice (bag of frozen green beans) has been able to avoid taking pain meds (which is her preference).   Currently in Pain? Yes   Pain Score 4    Pain Location Shoulder   Pain Orientation Right;Anterior;Lateral                  [facility] Adult PT Treatment/Exercise - 08/17/15 1015    Exercises   Exercises Shoulder   Shoulder Exercises: Supine   Horizontal ABduction 5 reps   Theraband Level (Shoulder Horizontal ABduction) Level 1 (Yellow)   Horizontal ABduction Limitations Hooklying on 1/2  foam roll   External Rotation --  unable without increased pain   Shoulder Exercises: Seated   Retraction Both;10 reps   Retraction Limitations 5" hold   Shoulder Exercises: Standing   Extension Strengthening;Both;5 reps;Theraband   Theraband Level (Shoulder Extension) Level 2 (Red)   Row Strengthening;Both;10 reps;Theraband   Theraband Level (Shoulder Row) Level 2 (Red)   Shoulder Exercises: Stretch   Cross Chest Stretch --  2 minutes   Cross Chest Stretch Limitations Hooklying on 1/2 foam roll   Other Shoulder Stretches Right upper trap stretch 3x20" (seated)   Modalities   Modalities Vasopneumatic   Vasopneumatic   Number Minutes Vasopneumatic  15 minutes   Vasopnuematic Location  Shoulder   Vasopneumatic Pressure Low   Vasopneumatic Temperature  Lowest   Manual Therapy   Manual Therapy Joint mobilization;Soft tissue mobilization   Joint Mobilization Grade I-II distraction, inf and A/P glides to patient tolerance   Soft tissue mobilization Gentle STM to biceps and pecs                  PT Short Term Goals - 08/15/15 1135    PT SHORT TERM GOAL #1   Title Indpendent with initial HEP (09/05/15)   Time 3   Period Weeks  PT Long Term Goals - 08/15/15 1135    PT LONG TERM GOAL #1   Title Independent with advanced HEP (10/10/15)   Time 8   Period Weeks   Status New   PT LONG TERM GOAL #2   Title Patient will demonstrate right shoulder ROM within 15 degrees of left without increased pain for improved functional reach (10/10/15)   Time 8   Period Weeks   Status New   PT LONG TERM GOAL #3   Title Right shoulder strength 4/5 or greater for improved shoulder stability during functional tasks (10/10/15)   Time 8   Period Weeks   Status New   PT LONG TERM GOAL #4   Title Patient will report pain at worst no greater than 5/10 in right shoulder (10/10/15)   Time 8   Period Weeks   Status New   PT LONG TERM GOAL #5   Title Patient will report  functional use of right UE during self care ADLs and daily activities (ie. cooking) without increased pain (10/10/15)   Time 8   Period Weeks   Status New               Plan - 08/17/15 1057    Clinical Impression Statement Patient reporting good tolerance with some relief of symptoms while completing HEP. States has to ease into static chest stretch over roll (using rolled blanket at home), starting with right arm at side and gradually moving arm out to side. Loops at end of TB work well with rows due to limited hand grip, and will be necessary for future TB exercises. Limited tolerance for progression of exercises during therapy, with considerable focus on manual therapy.   PT Next Visit Plan Review HEP; postural training, manual therapy, gentle shoulder ROM/strengthening, modalities PRN   Consulted and Agree with Plan of Care Patient        Problem List Patient Active Problem List   Diagnosis Date Noted  . Cervical disc disorder with radiculopathy of cervical region 07/17/2015  . Colon polyp   tubular adenoma  03/2014 03/22/2014  . H/O bone density study 07/09/2013  . Hyperlipidemia 06/13/2013  . Abnormal EKG 06/13/2013  . Osteopenia 06/13/2013  . Insomnia 06/09/2013  . 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 Anxiety 12/02/2012  . DJD (degenerative joint disease) 12/02/2012  . GERD (gastroesophageal reflux disease) 12/02/2012  . Eczema 12/02/2012  . Tendinitis of hand 12/02/2012    Percival Spanish, PT, MPT 08/17/2015, 11:12 AM  [facility] [address]  [place], [place], [address] Phone: [phone]   Fax:  [phone]

## 2015-08-20 ENCOUNTER — Ambulatory Visit: Payer: Medicare Other | Admitting: Rehabilitation

## 2015-08-20 DIAGNOSIS — M6281 Muscle weakness (generalized): Secondary | ICD-10-CM

## 2015-08-20 DIAGNOSIS — M25611 Stiffness of right shoulder, not elsewhere classified: Secondary | ICD-10-CM | POA: Diagnosis not present

## 2015-08-20 DIAGNOSIS — M25511 Pain in right shoulder: Secondary | ICD-10-CM | POA: Diagnosis not present

## 2015-08-20 DIAGNOSIS — R293 Abnormal posture: Secondary | ICD-10-CM

## 2015-08-20 NOTE — Therapy (Signed)
[facility] [address]  [place], [place], [address] Phone: [phone]   Fax:  [phone]  Physical Therapy Treatment  Patient Details  Name: Stephanie Barnes MRN: [id number] Date of Birth: January 24, 1951 Referring Provider:  Susa Day, MD  Encounter Date: 08/20/2015      PT End of Session - 08/20/15 1017    Visit Number 3   Number of Visits 16   Date for PT Re-Evaluation 10/10/15   PT Start Time 9163   PT Stop Time 1109   PT Time Calculation (min) 54 min   Activity Tolerance Patient tolerated treatment well   Behavior During Therapy [facility] for tasks assessed/performed      Past Medical History  Diagnosis Date  . Anxiety   . Arthritis   . Depression   . Depression with anxiety 12/02/2012    Past Surgical History  Procedure Laterality Date  . Tonsillectomy    . Dental surgery      Root cleaning   . Refractive surgery Bilateral 2000    There were no vitals filed for this visit.  Visit Diagnosis:  Pain of right shoulder region  Stiffness of right shoulder joint  Muscle right arm weakness  Posture abnormality      Subjective Assessment - 08/20/15 1017    Subjective Feels like all the stretching is doing good and felt good after therapy. The ice felt great too. States dressing has been a problem but she is able to complete, just takes her longer.    Currently in Pain? Yes   Pain Score 4    Pain Location Shoulder   Pain Orientation Right;Anterior;Lateral   Pain Descriptors / Indicators Sore                         [facility] Adult PT Treatment/Exercise - 08/20/15 1021    Exercises   Exercises Shoulder   Shoulder Exercises: Supine   Horizontal ABduction 10 reps  x5, then x4 (unable to complete 10)   Theraband Level (Shoulder Horizontal ABduction) Level 1 (Yellow)   Horizontal ABduction Limitations Hooklying on 1/2 foam roll   Other Supine Exercises Rhythmic stabilization at 90  degrees flexion x30"   Shoulder Exercises: Standing   Extension Strengthening;Both;Theraband;10 reps   Theraband Level (Shoulder Extension) Level 2 (Red)   Row Strengthening;Both;10 reps;Theraband   Theraband Level (Shoulder Row) Level 2 (Red)   Shoulder Exercises: Stretch   Cross Chest Stretch --  2 minutes   Cross Chest Stretch Limitations Hooklying on 1/2 foam roll   Other Shoulder Stretches Right upper trap stretch 3x20" (seated)   Other Shoulder Stretches Right levator stretch 3x20"   Modalities   Modalities Vasopneumatic   Vasopneumatic   Number Minutes Vasopneumatic  15 minutes   Vasopnuematic Location  Shoulder   Vasopneumatic Pressure Low   Vasopneumatic Temperature  Lowest   Manual Therapy   Manual Therapy Joint mobilization;Soft tissue mobilization   Joint Mobilization Grade I-II distraction, inf and A/P glides to patient tolerance   Soft tissue mobilization Gentle STM to biceps and pecs                PT Education - 08/20/15 1055    Education provided Yes   Education Details Supine horizontal abduction with yellow TB   Person(s) Educated Patient   Methods Explanation;Demonstration   Comprehension Verbalized understanding;Returned demonstration          PT Short Term Goals -  08/15/15 1135    PT SHORT TERM GOAL #1   Title Indpendent with initial HEP (09/05/15)   Time 3   Period Weeks           PT Long Term Goals - 08/15/15 1135    PT LONG TERM GOAL #1   Title Independent with advanced HEP (10/10/15)   Time 8   Period Weeks   Status New   PT LONG TERM GOAL #2   Title Patient will demonstrate right shoulder ROM within 15 degrees of left without increased pain for improved functional reach (10/10/15)   Time 8   Period Weeks   Status New   PT LONG TERM GOAL #3   Title Right shoulder strength 4/5 or greater for improved shoulder stability during functional tasks (10/10/15)   Time 8   Period Weeks   Status New   PT LONG TERM GOAL #4   Title  Patient will report pain at worst no greater than 5/10 in right shoulder (10/10/15)   Time 8   Period Weeks   Status New   PT LONG TERM GOAL #5   Title Patient will report functional use of right UE during self care ADLs and daily activities (ie. cooking) without increased pain (10/10/15)   Time 8   Period Weeks   Status New               Plan - 08/20/15 1054    Clinical Impression Statement Still has limited tolernace to exercise but reports good benefit from manual work. Continues to have trigger points in Rt bicep and pec area. Gave horizontal abduction with yellow TB as HEP today.    PT Next Visit Plan Review HEP; postural training, manual therapy, gentle shoulder ROM/strengthening, modalities PRN   Consulted and Agree with Plan of Care Patient        Problem List Patient Active Problem List   Diagnosis Date Noted  . Cervical disc disorder with radiculopathy of cervical region 07/17/2015  . Colon polyp   tubular adenoma  03/2014 03/22/2014  . H/O bone density study 07/09/2013  . Hyperlipidemia 06/13/2013  . Abnormal EKG 06/13/2013  . Osteopenia 06/13/2013  . Insomnia 06/09/2013  . 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 Anxiety 12/02/2012  . DJD (degenerative joint disease) 12/02/2012  . GERD (gastroesophageal reflux disease) 12/02/2012  . Eczema 12/02/2012  . Tendinitis of hand 12/02/2012    Barbette Hair, PTA 08/20/2015, 10:56 AM  [facility] [address]  [place], [place], [address] Phone: [phone]   Fax:  [phone]

## 2015-08-22 ENCOUNTER — Ambulatory Visit: Payer: Medicare Other | Admitting: Rehabilitation

## 2015-08-22 DIAGNOSIS — M25511 Pain in right shoulder: Secondary | ICD-10-CM

## 2015-08-22 DIAGNOSIS — M6281 Muscle weakness (generalized): Secondary | ICD-10-CM

## 2015-08-22 DIAGNOSIS — R293 Abnormal posture: Secondary | ICD-10-CM | POA: Diagnosis not present

## 2015-08-22 DIAGNOSIS — M25611 Stiffness of right shoulder, not elsewhere classified: Secondary | ICD-10-CM | POA: Diagnosis not present

## 2015-08-22 NOTE — Therapy (Signed)
[facility] [address]  [place], [place], [address] Phone: [phone]   Fax:  [phone]  Physical Therapy Treatment  Patient Details  Name: Stephanie Barnes MRN: [id number] Date of Birth: 01-02-1951 Referring Provider:  Susa Day, MD  Encounter Date: 08/22/2015      PT End of Session - 08/22/15 1021    Visit Number 4   Number of Visits 16   Date for PT Re-Evaluation 10/10/15   PT Start Time 1018   PT Stop Time 1106   PT Time Calculation (min) 48 min      Past Medical History  Diagnosis Date  . Anxiety   . Arthritis   . Depression   . Depression with anxiety 12/02/2012    Past Surgical History  Procedure Laterality Date  . Tonsillectomy    . Dental surgery      Root cleaning   . Refractive surgery Bilateral 2000    There were no vitals filed for this visit.  Visit Diagnosis:  Pain of right shoulder region  Stiffness of right shoulder joint  Muscle right arm weakness  Posture abnormality      Subjective Assessment - 08/22/15 1019    Subjective Has a pain in a few spot so she hasn't performed the yellow band exercise since she thinks its from that exercise.    Currently in Pain? Yes   Pain Score 5    Pain Location Shoulder   Pain Orientation Right;Anterior;Lateral                         [facility] Adult PT Treatment/Exercise - 08/22/15 1022    Exercises   Exercises Shoulder   Shoulder Exercises: Supine   Horizontal ABduction 5 reps   Theraband Level (Shoulder Horizontal ABduction) Level 1 (Yellow)   Horizontal ABduction Limitations Hooklying on 1/2 foam roll   Flexion Both;5 reps;Weights   Shoulder Flexion Weight (lbs) 1   Flexion Limitations Hooklying on 1/2 foam roll   Other Supine Exercises Circles at 90 degrees CW/CCW 8x each way   Shoulder Exercises: Stretch   Cross Chest Stretch --  2 minutes   Cross Chest Stretch Limitations Hooklying on 1/2 foam roll   Modalities   Modalities Vasopneumatic   Vasopneumatic   Number Minutes Vasopneumatic  15 minutes   Vasopnuematic Location  Shoulder   Vasopneumatic Pressure Low   Vasopneumatic Temperature  Lowest   Manual Therapy   Manual Therapy Joint mobilization;Soft tissue mobilization   Joint Mobilization Grade I-II distraction, inf and A/P glides to patient tolerance then grade I AC joint mobs   Soft tissue mobilization Gentle STM to biceps, posterior deltoid, upper trap and pecs                  PT Short Term Goals - 08/22/15 1052    PT SHORT TERM GOAL #1   Title Indpendent with initial HEP (09/05/15)   Status Achieved           PT Long Term Goals - 08/22/15 1052    PT LONG TERM GOAL #1   Title Independent with advanced HEP (10/10/15)   Status On-going   PT LONG TERM GOAL #2   Title Patient will demonstrate right shoulder ROM within 15 degrees of left without increased pain for improved functional reach (10/10/15)   Status On-going   PT LONG TERM GOAL #3   Title Right shoulder strength 4/5 or greater for  improved shoulder stability during functional tasks (10/10/15)   Status On-going   PT LONG TERM GOAL #4   Title Patient will report pain at worst no greater than 5/10 in right shoulder (10/10/15)   Status On-going   PT LONG TERM GOAL #5   Title Patient will report functional use of right UE during self care ADLs and daily activities (ie. cooking) without increased pain (10/10/15)   Status On-going               Plan - 08/22/15 1049    Clinical Impression Statement Pt with more pain today and advised her to reduce intensity of the horizontal abduction exercise. Very TTP along Rt AC joint and performed STM along with mobs to this area. Bicep also with continued trigger points and noted some tender spots along posterior deltoid today. Still limited tolerance to exercise and kept all exercises supine today.    PT Next Visit Plan Review HEP; postural training, manual  therapy, gentle shoulder ROM/strengthening, modalities PRN   Consulted and Agree with Plan of Care Patient        Problem List Patient Active Problem List   Diagnosis Date Noted  . Cervical disc disorder with radiculopathy of cervical region 07/17/2015  . Colon polyp   tubular adenoma  03/2014 03/22/2014  . H/O bone density study 07/09/2013  . Hyperlipidemia 06/13/2013  . Abnormal EKG 06/13/2013  . Osteopenia 06/13/2013  . Insomnia 06/09/2013  . 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 Anxiety 12/02/2012  . DJD (degenerative joint disease) 12/02/2012  . GERD (gastroesophageal reflux disease) 12/02/2012  . Eczema 12/02/2012  . Tendinitis of hand 12/02/2012    Barbette Hair, PTA 08/22/2015, 10:53 AM  [facility] [address]  [place], [place], [address] Phone: [phone]   Fax:  [phone]

## 2015-08-28 ENCOUNTER — Ambulatory Visit: Payer: Medicare Other | Admitting: Physical Therapy

## 2015-08-28 DIAGNOSIS — M6281 Muscle weakness (generalized): Secondary | ICD-10-CM | POA: Diagnosis not present

## 2015-08-28 DIAGNOSIS — R293 Abnormal posture: Secondary | ICD-10-CM | POA: Diagnosis not present

## 2015-08-28 DIAGNOSIS — M25511 Pain in right shoulder: Secondary | ICD-10-CM | POA: Diagnosis not present

## 2015-08-28 DIAGNOSIS — M25611 Stiffness of right shoulder, not elsewhere classified: Secondary | ICD-10-CM

## 2015-08-28 NOTE — Therapy (Signed)
[facility] [address]  [place], [place], [address] Phone: [phone]   Fax:  [phone]  Physical Therapy Treatment  Patient Details  Name: Stephanie Barnes MRN: [id number] Date of Birth: 09-28-1951 Referring Provider: Susa Day, MD  Encounter Date: 08/28/2015      PT End of Session - 08/28/15 1023    Visit Number 5   Number of Visits 16   Date for PT Re-Evaluation 10/10/15   PT Start Time 1016   PT Stop Time 1108   PT Time Calculation (min) 52 min   Activity Tolerance Patient tolerated treatment well;Patient limited by pain   Behavior During Therapy [facility] for tasks assessed/performed      Past Medical History  Diagnosis Date  . Anxiety   . Arthritis   . Depression   . Depression with anxiety 12/02/2012    Past Surgical History  Procedure Laterality Date  . Tonsillectomy    . Dental surgery      Root cleaning   . Refractive surgery Bilateral 2000    There were no vitals filed for this visit.  Visit Diagnosis:  Pain of right shoulder region  Stiffness of right shoulder joint  Muscle right arm weakness  Posture abnormality      Subjective Assessment - 08/28/15 1018    Subjective Saw accupunturist after last PT visit who attached estim to accupunture needles. States estim helped alot and was painfree Sat & Sun. Due to feeeling good she tried to increase her HEP exercises and thinks she may have overdone it and it sore again today.   Currently in Pain? Yes   Pain Score 5    Pain Orientation Right;Anterior;Lateral            [facility] PT Assessment - 08/28/15 1016    Assessment   Referring Provider Susa Day, MD                     [facility] Adult PT Treatment/Exercise - 08/28/15 1016    Exercises   Exercises Shoulder   Shoulder Exercises: Sidelying   ABduction AAROM;Right;10 reps   ABduction Limitations MWM    Other Sidelying Exercises AAROM Horizontal abduction  with scapular retraction to vertical   Shoulder Exercises: Standing   Other Standing Exercises Rt shoulder depression with yellow TB 10x5"   Shoulder Exercises: Stretch   Cross Chest Stretch --  2 minutes   Cross Chest Stretch Limitations Hooklying on 1/2 foam roll   Modalities   Modalities Vasopneumatic   Vasopneumatic   Number Minutes Vasopneumatic  15 minutes   Vasopnuematic Location  Shoulder   Vasopneumatic Pressure Low   Vasopneumatic Temperature  Lowest   Manual Therapy   Manual Therapy Joint mobilization;Soft tissue mobilization   Joint Mobilization Grade I-II distraction, inf and A/P glides to patient tolerance then grade I AC joint mobs   Soft tissue mobilization Gentle STM to biceps, posterior deltoid, upper trap and pecs                  PT Short Term Goals - 08/22/15 1052    PT SHORT TERM GOAL #1   Title Indpendent with initial HEP (09/05/15)   Status Achieved           PT Long Term Goals - 08/22/15 1052    PT LONG TERM GOAL #1   Title Independent with advanced HEP (10/10/15)   Status On-going   PT LONG TERM GOAL #2  Title Patient will demonstrate right shoulder ROM within 15 degrees of left without increased pain for improved functional reach (10/10/15)   Status On-going   PT LONG TERM GOAL #3   Title Right shoulder strength 4/5 or greater for improved shoulder stability during functional tasks (10/10/15)   Status On-going   PT LONG TERM GOAL #4   Title Patient will report pain at worst no greater than 5/10 in right shoulder (10/10/15)   Status On-going   PT LONG TERM GOAL #5   Title Patient will report functional use of right UE during self care ADLs and daily activities (ie. cooking) without increased pain (10/10/15)   Status On-going               Plan - 08/28/15 1107    Clinical Impression Statement Patient with increased pain again today after "overdoing it" with HEP secondary to feeling good/painfree for 2 days after  estim/accupunture treatment last Thursday. Cautioned patient to avoid attempting to progresss exercises without PT guidance as patient easily irritated. Increased emphasis on manual therapy today with STM to pecs, biceps, posterior deltoid and infraspinatus; and joint mobs both statically and with movements to open up joint space with patient reporting slightly increased tolerance for movement, but still quite painful with certain movements. Added shoulder depression with yellow TB to continue to faciliate neutral shoulder posture and opening up of joint space.   PT Next Visit Plan Review HEP with upgrade as appropriate; postural training, manual therapy, gentle shoulder ROM/strengthening, modalities PRN; ** MD note   Consulted and Agree with Plan of Care Patient        Problem List Patient Active Problem List   Diagnosis Date Noted  . Cervical disc disorder with radiculopathy of cervical region 07/17/2015  . Colon polyp   tubular adenoma  03/2014 03/22/2014  . H/O bone density study 07/09/2013  . Hyperlipidemia 06/13/2013  . Abnormal EKG 06/13/2013  . Osteopenia 06/13/2013  . Insomnia 06/09/2013  . 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 Anxiety 12/02/2012  . DJD (degenerative joint disease) 12/02/2012  . GERD (gastroesophageal reflux disease) 12/02/2012  . Eczema 12/02/2012  . Tendinitis of hand 12/02/2012    Percival Spanish, PT, MPT 08/28/2015, 11:33 AM  [facility] [address]  [place], [place], [address] Phone: [phone]   Fax:  [phone]  Name: Aniesha Haughn MRN: [id number] Date of Birth: 06/23/1951

## 2015-08-31 ENCOUNTER — Encounter: Payer: Self-pay | Admitting: Physical Therapy

## 2015-08-31 ENCOUNTER — Ambulatory Visit: Payer: Medicare Other | Admitting: Physical Therapy

## 2015-08-31 DIAGNOSIS — M6281 Muscle weakness (generalized): Secondary | ICD-10-CM | POA: Diagnosis not present

## 2015-08-31 DIAGNOSIS — M25611 Stiffness of right shoulder, not elsewhere classified: Secondary | ICD-10-CM | POA: Diagnosis not present

## 2015-08-31 DIAGNOSIS — M25511 Pain in right shoulder: Secondary | ICD-10-CM

## 2015-08-31 DIAGNOSIS — R293 Abnormal posture: Secondary | ICD-10-CM

## 2015-08-31 NOTE — Therapy (Signed)
[facility] [address]  [place], [place], [address] Phone: [phone]   Fax:  [phone]  Physical Therapy Treatment  Patient Details  Name: Stephanie Barnes MRN: [id number] Date of Birth: May 26, 1951 Referring Provider: Susa Day, MD  Encounter Date: 08/31/2015      PT End of Session - 08/31/15 1203    Visit Number 6   Number of Visits 16   Date for PT Re-Evaluation 10/10/15   PT Start Time 1018   PT Stop Time 1115   PT Time Calculation (min) 57 min   Activity Tolerance Patient tolerated treatment well;Patient limited by pain   Behavior During Therapy [facility] for tasks assessed/performed      Past Medical History  Diagnosis Date  . Anxiety   . Arthritis   . Depression   . Depression with anxiety 12/02/2012    Past Surgical History  Procedure Laterality Date  . Tonsillectomy    . Dental surgery      Root cleaning   . Refractive surgery Bilateral 2000    There were no vitals filed for this visit.  Visit Diagnosis:  Pain of right shoulder region  Stiffness of right shoulder joint  Muscle right arm weakness  Posture abnormality      Subjective Assessment - 08/31/15 1057    Subjective Patient frustrated as she feels like pain is worse than when she started PT. Has stopped golfing and noting increased trouble finding a comfortable position. States she is not taking Motrin due to fear of stomach irritation.   Currently in Pain? Yes   Pain Score 4    Pain Location Shoulder   Pain Orientation Right;Anterior;Lateral;Upper   Pain Descriptors / Indicators Aching            [facility] PT Assessment - 08/31/15 1018    ROM / Strength   AROM / PROM / Strength AROM   AROM   AROM Assessment Site Shoulder   Right/Left Shoulder Right   Right Shoulder Flexion 122 Degrees  limited by pain   Right Shoulder ABduction 84 Degrees  limited by pain   Right Shoulder External Rotation 57 Degrees   Palpation    Palpation comment ttp over right upper trap, lateral deltoid, long head of biceps tendon and supraspinatus/infraspinatus insertion on humerous                  [facility] Adult PT Treatment/Exercise - 08/31/15 1018    Exercises   Exercises Shoulder   Shoulder Exercises: Supine   Protraction Right;10 reps   Flexion Both;5 reps   Flexion Limitations Hooklying on 1/2 foam roll with cane   Shoulder Exercises: Standing   Other Standing Exercises Rt shoulder depression + retraction with yellow TB 10x5"   Shoulder Exercises: Stretch   Cross Chest Stretch --  2 minutes   Cross Chest Stretch Limitations Hooklying on 1/2 foam roll   Modalities   Modalities Vasopneumatic   Vasopneumatic   Number Minutes Vasopneumatic  15 minutes   Vasopnuematic Location  Shoulder   Vasopneumatic Pressure Low   Vasopneumatic Temperature  Lowest   Manual Therapy   Manual Therapy Joint mobilization;Soft tissue mobilization   Manual therapy comments Manual stretch to upper trap and levator scapulae in supine   Joint Mobilization Grade I-II right shoulder distraction, inf and A/P glides to patient tolerance then grade I AC joint mobs; Right 1st rib mobs   Soft tissue mobilization Gentle STM to right biceps, posterior deltoid,  upper trap and pecs                  PT Short Term Goals - 08/22/15 1052    PT SHORT TERM GOAL #1   Title Indpendent with initial HEP (09/05/15)   Status Achieved           PT Long Term Goals - 08/31/15 1214    PT LONG TERM GOAL #1   Title Independent with advanced HEP (10/10/15)   Status On-going   PT LONG TERM GOAL #2   Title Patient will demonstrate right shoulder ROM within 15 degrees of left without increased pain for improved functional reach (10/10/15)   Status On-going   PT LONG TERM GOAL #3   Title Right shoulder strength 4/5 or greater for improved shoulder stability during functional tasks (10/10/15)   Status On-going   PT LONG TERM GOAL #4    Title Patient will report pain at worst no greater than 5/10 in right shoulder (10/10/15)   Status On-going   PT LONG TERM GOAL #5   Title Patient will report functional use of right UE during self care ADLs and daily activities (ie. cooking) without increased pain (10/10/15)   Status On-going               Plan - 08/31/15 1204    Clinical Impression Statement Patient expressing frustration with lack of progress and feeling that pain is worse than when she started PT. Reports limited tolerance with some HEP exercises (hooklying horizontal abduction with yellow theraband) but states scapular retraction exercises feel good. Patient continues to demonstrate significant right forward elevated shoulder posture with significant guarding of right shoulder/arm with decreased ROM in overhead flexion and abduction noted. Patient reports not taking anything for pain, and specifically not taking Motrin due to concerns of stomach upset. Encouraged patient to try taking Motrin/ibuprofen with food to help with both pain and inflammtion to allow for improved tolerance for exercises.                 PT Next Visit Plan Review HEP with upgrade as appropriate; postural training, manual therapy, gentle shoulder ROM/strengthening, modalities PRN   Consulted and Agree with Plan of Care Patient        Problem List Patient Active Problem List   Diagnosis Date Noted  . Cervical disc disorder with radiculopathy of cervical region 07/17/2015  . Colon polyp   tubular adenoma  03/2014 03/22/2014  . H/O bone density study 07/09/2013  . Hyperlipidemia 06/13/2013  . Abnormal EKG 06/13/2013  . Osteopenia 06/13/2013  . Insomnia 06/09/2013  . 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 Anxiety 12/02/2012  . DJD (degenerative joint disease) 12/02/2012  . GERD (gastroesophageal reflux disease) 12/02/2012  . Eczema 12/02/2012  . Tendinitis of hand 12/02/2012    Percival Spanish, PT, MPT 08/31/2015, 12:19 PM  [facility] [address]  [place], [place], [address] Phone: [phone]   Fax:  [phone]  Name: Stephanie Barnes MRN: [id number] Date of Birth: 1951-03-31

## 2015-09-03 DIAGNOSIS — M25511 Pain in right shoulder: Secondary | ICD-10-CM | POA: Diagnosis not present

## 2015-09-03 DIAGNOSIS — M7541 Impingement syndrome of right shoulder: Secondary | ICD-10-CM | POA: Diagnosis not present

## 2015-09-03 DIAGNOSIS — M7501 Adhesive capsulitis of right shoulder: Secondary | ICD-10-CM | POA: Diagnosis not present

## 2015-09-04 ENCOUNTER — Ambulatory Visit: Payer: Medicare Other | Admitting: Physical Therapy

## 2015-09-04 DIAGNOSIS — M6281 Muscle weakness (generalized): Secondary | ICD-10-CM | POA: Diagnosis not present

## 2015-09-04 DIAGNOSIS — M25611 Stiffness of right shoulder, not elsewhere classified: Secondary | ICD-10-CM | POA: Diagnosis not present

## 2015-09-04 DIAGNOSIS — R293 Abnormal posture: Secondary | ICD-10-CM

## 2015-09-04 DIAGNOSIS — M25511 Pain in right shoulder: Secondary | ICD-10-CM

## 2015-09-04 NOTE — Therapy (Signed)
[facility] [address]  [place], [place], [address] Phone: [phone]   Fax:  [phone]  Physical Therapy Treatment  Patient Details  Name: Stephanie Barnes MRN: [id number] Date of Birth: [birth year] Referring Provider: Susa Day, MD  Encounter Date: 09/04/2015      PT End of Session - 09/04/15 1037    Visit Number 7   Number of Visits 16   PT Start Time 1017   PT Stop Time 1058   PT Time Calculation (min) 41 min   Activity Tolerance Patient tolerated treatment well   Behavior During Therapy [facility] for tasks assessed/performed      Past Medical History  Diagnosis Date  . Anxiety   . Arthritis   . Depression   . Depression with anxiety 12/02/2012    Past Surgical History  Procedure Laterality Date  . Tonsillectomy    . Dental surgery      Root cleaning   . Refractive surgery Bilateral 2000    There were no vitals filed for this visit.  Visit Diagnosis:  Pain of right shoulder region  Stiffness of right shoulder joint  Muscle right arm weakness  Posture abnormality      Subjective Assessment - 09/04/15 1022    Subjective Patient saw MD yesterday. States MD was not overly concerned about recent increased irritation, attributing it to increased activity since starting PT. Reports MD's biggest concern is avoiding a frozen shoulder and recommended AAROM and isometrics. Only needs to f/u with MD (in 6 wks) if ROM does not improve or worsens.   Currently in Pain? Yes   Pain Score 2    Pain Location Shoulder   Pain Orientation Right;Anterior;Lateral                  [facility] Adult PT Treatment/Exercise - 09/04/15 1017    Exercises   Exercises Shoulder   Shoulder Exercises: Supine   Protraction Right;10 reps   Shoulder Exercises: Seated   External Rotation AAROM;Right;10 reps   External Rotation Limitations 5" hold with cane/wand   Other Seated Exercises Shoulder rolls backwards x10    Shoulder Exercises: Standing   ABduction AAROM;Right;10 reps   ABduction Limitations 5" hold with cane/wand   Shoulder Exercises: Isometric Strengthening   External Rotation 5X5"   External Rotation Limitations doorframe   Internal Rotation 5X5"   Internal Rotation Limitations doorframe   Shoulder Exercises: Stretch   Cross Chest Stretch --  2 minutes   Cross Chest Stretch Limitations Hooklying on 1/2 foam roll   Other Shoulder Stretches Right upper trap stretch 3x20" (seated)   Other Shoulder Stretches Right levator stretch 3x20"                PT Education - 09/04/15 1101    Education provided Yes   Education Details HEP update - wand & isometric exercises   Person(s) Educated Patient   Methods Explanation;Demonstration;Handout   Comprehension Verbalized understanding;Returned demonstration;Need further instruction          PT Short Term Goals - 08/22/15 1052    PT SHORT TERM GOAL #1   Title Indpendent with initial HEP (09/05/15)   Status Achieved           PT Long Term Goals - 08/31/15 1214    PT LONG TERM GOAL #1   Title Independent with advanced HEP (10/10/15)   Status On-going   PT LONG TERM GOAL #2   Title Patient will demonstrate right  shoulder ROM within 15 degrees of left without increased pain for improved functional reach (10/10/15)   Status On-going   PT LONG TERM GOAL #3   Title Right shoulder strength 4/5 or greater for improved shoulder stability during functional tasks (10/10/15)   Status On-going   PT LONG TERM GOAL #4   Title Patient will report pain at worst no greater than 5/10 in right shoulder (10/10/15)   Status On-going   PT LONG TERM GOAL #5   Title Patient will report functional use of right UE during self care ADLs and daily activities (ie. cooking) without increased pain (10/10/15)   Status On-going               Plan - 09/04/15 1102    Clinical Impression Statement Patient demonstrating good tolerance to  introduction of wand exercises for shoulder ER and abduction but could not tolerate IR with wand. Also able to perform shoulder IR/ER isometrics without pain. HEP upgraded to include wand and isometric exercises, but patient cautioned not to over-do exercises even if painfree and feeling good.   PT Next Visit Plan Review HEP with upgrade as appropriate; postural training, manual therapy, gentle shoulder ROM/strengthening, modalities PRN   Consulted and Agree with Plan of Care Patient        Problem List Patient Active Problem List   Diagnosis Date Noted  . Cervical disc disorder with radiculopathy of cervical region 07/17/2015  . Colon polyp   tubular adenoma  03/2014 03/22/2014  . H/O bone density study 07/09/2013  . Hyperlipidemia 06/13/2013  . Abnormal EKG 06/13/2013  . Osteopenia 06/13/2013  . Insomnia 06/09/2013  . 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 Anxiety 12/02/2012  . DJD (degenerative joint disease) 12/02/2012  . GERD (gastroesophageal reflux disease) 12/02/2012  . Eczema 12/02/2012  . Tendinitis of hand 12/02/2012    Percival Spanish, PT, MPT 09/04/2015, 11:39 AM  [facility] [address]  [place], [place], [address] Phone: [phone]   Fax:  [phone]  Name: Stephanie Barnes MRN: [id number] Date of Birth: Nov 06, 1951

## 2015-09-07 ENCOUNTER — Ambulatory Visit: Payer: Medicare Other | Admitting: Physical Therapy

## 2015-09-07 DIAGNOSIS — M6281 Muscle weakness (generalized): Secondary | ICD-10-CM | POA: Diagnosis not present

## 2015-09-07 DIAGNOSIS — R293 Abnormal posture: Secondary | ICD-10-CM | POA: Diagnosis not present

## 2015-09-07 DIAGNOSIS — M25611 Stiffness of right shoulder, not elsewhere classified: Secondary | ICD-10-CM

## 2015-09-07 DIAGNOSIS — M25511 Pain in right shoulder: Secondary | ICD-10-CM

## 2015-09-07 NOTE — Therapy (Addendum)
[facility] [address]  [place], [place], [address] Phone: [phone]   Fax:  [phone]  Physical Therapy Treatment  Patient Details  Name: Stephanie Barnes MRN: [id number] Date of Birth: [birth year] Referring Provider: Susa Day, MD  Encounter Date: 09/07/2015      PT End of Session - 09/07/15 1044    Visit Number 8   Number of Visits 16   Date for PT Re-Evaluation 10/10/15   PT Start Time 1020   PT Stop Time 1059   PT Time Calculation (min) 39 min   Activity Tolerance Patient tolerated treatment well   Behavior During Therapy [facility] for tasks assessed/performed      Past Medical History  Diagnosis Date  . Anxiety   . Arthritis   . Depression   . Depression with anxiety 12/02/2012    Past Surgical History  Procedure Laterality Date  . Tonsillectomy    . Dental surgery      Root cleaning   . Refractive surgery Bilateral 2000    There were no vitals filed for this visit.  Visit Diagnosis:  Pain of right shoulder region  Stiffness of right shoulder joint  Muscle right arm weakness  Posture abnormality      Subjective Assessment - 09/07/15 1040    Subjective Patient reporting things seem to be calmed down more this week and notes improving functional use of right arm. Patient feels encouraged by improvements.   Currently in Pain? Yes   Pain Score --  2-3/10   Pain Location Shoulder   Pain Orientation Right;Anterior;Lateral                 [facility] Adult PT Treatment/Exercise - 09/07/15 1020    Exercises   Exercises Shoulder   Shoulder Exercises: Seated   External Rotation AAROM;Right;10 reps   External Rotation Limitations 5" hold with cane/wand   Other Seated Exercises Shoulder rolls backwards x10   Shoulder Exercises: Standing   ABduction AAROM;Right;10 reps   ABduction Limitations 5" hold with cane/wand   Row Strengthening;Both;10 reps;Theraband  2 sets   Theraband  Level (Shoulder Row) Level 2 (Red);Level 3 (Green)   Row Limitations 1st set with red TB, 2nd set with green TB   Other Standing Exercises Rt shoulder depression + retraction with yellow TB 10x5"   Shoulder Exercises: Isometric Strengthening   External Rotation 5X5"   External Rotation Limitations doorframe   Internal Rotation 5X5"   Internal Rotation Limitations doorframe   Modalities   Modalities Ultrasound   Ultrasound   Ultrasound Location Right shoulder   Ultrasound Parameters 20%, 5cm, 1.0 W/cm2, 1.0 MHz x 8'   Ultrasound Goals Pain   Manual Therapy   Manual Therapy Joint mobilization;Soft tissue mobilization;Scapular mobilization   Joint Mobilization Grade I-II right shoulder distraction, inf and A/P glides to patient tolerance then grade I AC joint mobs; Right 1st rib mobs   Soft tissue mobilization Gentle STM to right biceps, posterior deltoid, upper trap and pecs   Scapular Mobilization Scapular mobs all direcrtions                  PT Short Term Goals - 08/22/15 1052    PT SHORT TERM GOAL #1   Title Indpendent with initial HEP (09/05/15)   Status Achieved           PT Long Term Goals - 09/07/15 1100    PT LONG TERM GOAL #1   Title Independent with  advanced HEP (10/10/15)   Status On-going   PT LONG TERM GOAL #2   Title Patient will demonstrate right shoulder ROM within 15 degrees of left without increased pain for improved functional reach (10/10/15)   Status On-going   PT LONG TERM GOAL #3   Title Right shoulder strength 4/5 or greater for improved shoulder stability during functional tasks (10/10/15)   Status On-going   PT LONG TERM GOAL #4   Title Patient will report pain at worst no greater than 5/10 in right shoulder (10/10/15)   Status On-going   PT LONG TERM GOAL #5   Title Patient will report functional use of right UE during self care ADLs and daily activities (ie. cooking) without increased pain (10/10/15)   Status On-going                Plan - 09/07/15 1101    Clinical Impression Statement Patient reporting feeling like things are calming down and noting increased movement without pain during functional activities. Initiated [place] prior to exercises to facilitate muscle warm-up and pain reduction. Reviewed new exercises from last visit with patient able to perform all exercises appropriately. Advanced resistance to green TB with rows/scapular retraction.   PT Next Visit Plan Review HEP with upgrade as appropriate; postural training, manual therapy, gentle shoulder ROM/strengthening, modalities PRN   Consulted and Agree with Plan of Care Patient        Problem List Patient Active Problem List   Diagnosis Date Noted  . Cervical disc disorder with radiculopathy of cervical region 07/17/2015  . Colon polyp   tubular adenoma  03/2014 03/22/2014  . H/O bone density study 07/09/2013  . Hyperlipidemia 06/13/2013  . Abnormal EKG 06/13/2013  . Osteopenia 06/13/2013  . Insomnia 06/09/2013  . 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 Anxiety 12/02/2012  . DJD (degenerative joint disease) 12/02/2012  . GERD (gastroesophageal reflux disease) 12/02/2012  . Eczema 12/02/2012  . Tendinitis of hand 12/02/2012    Percival Spanish, PT, MPT 09/07/2015, 12:09 PM  [facility] [address]  [place], [place], [address] Phone: [phone]   Fax:  [phone]  Name: Marcheta Horsey MRN: [id number] Date of Birth: [birth year]

## 2015-09-11 ENCOUNTER — Ambulatory Visit: Payer: Medicare Other | Attending: Specialist | Admitting: Physical Therapy

## 2015-09-11 DIAGNOSIS — M25511 Pain in right shoulder: Secondary | ICD-10-CM | POA: Diagnosis not present

## 2015-09-11 DIAGNOSIS — M6281 Muscle weakness (generalized): Secondary | ICD-10-CM | POA: Insufficient documentation

## 2015-09-11 DIAGNOSIS — M25611 Stiffness of right shoulder, not elsewhere classified: Secondary | ICD-10-CM | POA: Insufficient documentation

## 2015-09-11 DIAGNOSIS — R293 Abnormal posture: Secondary | ICD-10-CM | POA: Insufficient documentation

## 2015-09-11 NOTE — Therapy (Signed)
[facility] [address]  [place], [place], [address] Phone: [phone]   Fax:  [phone]  Physical Therapy Treatment  Patient Details  Name: Stephanie Barnes MRN: [id number] Date of Birth: 12-Feb-1951 Referring Provider: Susa Day, MD  Encounter Date: 09/11/2015      PT End of Session - 09/11/15 1028    Visit Number 9   Number of Visits 16   Date for PT Re-Evaluation 10/10/15   PT Start Time 1022  Patient arrived late   PT Stop Time 1121   PT Time Calculation (min) 59 min   Activity Tolerance Patient tolerated treatment well   Behavior During Therapy [facility] for tasks assessed/performed      Past Medical History  Diagnosis Date  . Anxiety   . Arthritis   . Depression   . Depression with anxiety 12/02/2012    Past Surgical History  Procedure Laterality Date  . Tonsillectomy    . Dental surgery      Root cleaning   . Refractive surgery Bilateral 2000    There were no vitals filed for this visit.  Visit Diagnosis:  Pain of right shoulder region  Stiffness of right shoulder joint  Muscle right arm weakness  Posture abnormality      Subjective Assessment - 09/11/15 1025    Subjective Patient reported that she suffered a set-back yesterday when she tried to lift the fold-down attic stairs door back up after the heating serviceman failed to do so, when the ladder started to unfold hitting her in the face and as it was falling she reached up to block it with her right arm. States arm is much more sore today as a result.   Currently in Pain? Yes   Pain Score 5    Pain Location Shoulder   Pain Orientation Right              [facility] Adult PT Treatment/Exercise - 09/11/15 1022    Exercises   Exercises Shoulder   Shoulder Exercises: Seated   External Rotation AAROM;Right;10 reps   External Rotation Limitations 5" hold with cane/wand   Other Seated Exercises Shoulder rolls backwards x10   Shoulder Exercises: Standing   ABduction AAROM;Right;5 reps   ABduction Limitations 5" hold with cane/wand   Row Strengthening;Both;10 reps;Theraband  2 sets   Theraband Level (Shoulder Row) Level 2 (Red)   Other Standing Exercises Rt shoulder depression + retraction with yellow TB 10x5"   Shoulder Exercises: Stretch   Cross Chest Stretch --  2 minutes   Cross Chest Stretch Limitations Hooklying on 1/2 foam roll   Modalities   Modalities Electrical Stimulation;Vasopneumatic   Acupuncturist Location Channel 1 - Rt superior shoulder/UT, Channel 2 - Rt upper arm/biceps   Electrical Stimulation Action Pre-mod   Electrical Stimulation Parameters Continuous, variable beat freq 10-20Hz , intensity to patient tolerance   Electrical Stimulation Goals Pain   Vasopneumatic   Number Minutes Vasopneumatic  15 minutes   Vasopnuematic Location  Shoulder   Vasopneumatic Pressure Low   Vasopneumatic Temperature  Lowest   Manual Therapy   Manual Therapy Joint mobilization;Soft tissue mobilization;Scapular mobilization   Joint Mobilization Grade I-II right shoulder distraction, inf and A/P glides to patient tolerance   Soft tissue mobilization TPR right UT & biceps; Gentle STM to right biceps, posterior deltoid, upper trap and pecs  PT Short Term Goals - 08/22/15 1052    PT SHORT TERM GOAL #1   Title Indpendent with initial HEP (09/05/15)   Status Achieved           PT Long Term Goals - 09/07/15 1100    PT LONG TERM GOAL #1   Title Independent with advanced HEP (10/10/15)   Status On-going   PT LONG TERM GOAL #2   Title Patient will demonstrate right shoulder ROM within 15 degrees of left without increased pain for improved functional reach (10/10/15)   Status On-going   PT LONG TERM GOAL #3   Title Right shoulder strength 4/5 or greater for improved shoulder stability during functional tasks (10/10/15)   Status On-going   PT  LONG TERM GOAL #4   Title Patient will report pain at worst no greater than 5/10 in right shoulder (10/10/15)   Status On-going   PT LONG TERM GOAL #5   Title Patient will report functional use of right UE during self care ADLs and daily activities (ie. cooking) without increased pain (10/10/15)   Status On-going               Plan - 09/11/15 1125    Clinical Impression Statement Patient with acute flare-up after folding attic ladder fell on her yesterday at which time she jarred her right arm reaching out to block the falling ladder. Since the incident, the patient is reporting increased pain and fear of attempting exercises until "checked out by PT". Patient able to perform all exercises during therapy visit with no increased pain other than slightly increased discomfort with AAROM shoulder abduction with cane. No progression attempted today and increased manual focus on TPR and STM to upper traps, RTC and biceps with estim added during vasopneumatic compression. Patient left therapy feeling much better.   PT Next Visit Plan Review HEP with upgrade as appropriate; postural training, manual therapy, gentle shoulder ROM/strengthening, modalities PRN   Consulted and Agree with Plan of Care Patient        Problem List Patient Active Problem List   Diagnosis Date Noted  . Cervical disc disorder with radiculopathy of cervical region 07/17/2015  . Colon polyp   tubular adenoma  03/2014 03/22/2014  . H/O bone density study 07/09/2013  . Hyperlipidemia 06/13/2013  . Abnormal EKG 06/13/2013  . Osteopenia 06/13/2013  . Insomnia 06/09/2013  . 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 Anxiety 12/02/2012  . DJD (degenerative joint disease) 12/02/2012  . GERD (gastroesophageal reflux disease) 12/02/2012  . Eczema 12/02/2012  . Tendinitis of hand 12/02/2012    Percival Spanish, PT, MPT 09/11/2015, 1:07 PM  [facility] [address]   [place], [place], [address] Phone: [phone]   Fax:  [phone]  Name: Stephanie Barnes MRN: [id number] Date of Birth: 08-Sep-1951

## 2015-09-12 ENCOUNTER — Encounter: Payer: Self-pay | Admitting: Physician Assistant

## 2015-09-13 ENCOUNTER — Ambulatory Visit (HOSPITAL_BASED_OUTPATIENT_CLINIC_OR_DEPARTMENT_OTHER)
Admission: RE | Admit: 2015-09-13 | Discharge: 2015-09-13 | Disposition: A | Discharge status: Home / Self Care | Payer: Medicare Other | Source: Ambulatory Visit | Attending: Family Medicine | Admitting: Family Medicine

## 2015-09-13 ENCOUNTER — Other Ambulatory Visit (INDEPENDENT_AMBULATORY_CARE_PROVIDER_SITE_OTHER): Payer: Medicare Other

## 2015-09-13 ENCOUNTER — Ambulatory Visit (INDEPENDENT_AMBULATORY_CARE_PROVIDER_SITE_OTHER): Payer: Medicare Other | Admitting: Family Medicine

## 2015-09-13 ENCOUNTER — Encounter: Payer: Self-pay | Admitting: Family Medicine

## 2015-09-13 VITALS — BP 132/86 | HR 70 | Temp 98.0°F | Ht 59.5 in | Wt 133.4 lb

## 2015-09-13 DIAGNOSIS — M25511 Pain in right shoulder: Secondary | ICD-10-CM | POA: Diagnosis not present

## 2015-09-13 DIAGNOSIS — M778 Other enthesopathies, not elsewhere classified: Secondary | ICD-10-CM | POA: Insufficient documentation

## 2015-09-13 DIAGNOSIS — K219 Gastro-esophageal reflux disease without esophagitis: Secondary | ICD-10-CM

## 2015-09-13 DIAGNOSIS — L03211 Cellulitis of face: Secondary | ICD-10-CM | POA: Diagnosis not present

## 2015-09-13 DIAGNOSIS — M8580 Other specified disorders of bone density and structure, unspecified site: Secondary | ICD-10-CM | POA: Diagnosis not present

## 2015-09-13 DIAGNOSIS — E785 Hyperlipidemia, unspecified: Secondary | ICD-10-CM | POA: Diagnosis not present

## 2015-09-13 DIAGNOSIS — Z0000 Encounter for general adult medical examination without abnormal findings: Secondary | ICD-10-CM | POA: Diagnosis not present

## 2015-09-13 DIAGNOSIS — G4700 Insomnia, unspecified: Secondary | ICD-10-CM

## 2015-09-13 DIAGNOSIS — E782 Mixed hyperlipidemia: Secondary | ICD-10-CM | POA: Diagnosis not present

## 2015-09-13 DIAGNOSIS — S4991XD Unspecified injury of right shoulder and upper arm, subsequent encounter: Secondary | ICD-10-CM | POA: Diagnosis not present

## 2015-09-13 LAB — COMPREHENSIVE METABOLIC PANEL
ALT: 12 U/L (ref 0–35)
AST: 12 U/L (ref 0–37)
Albumin: 4.1 g/dL (ref 3.5–5.2)
Alkaline Phosphatase: 34 U/L — ABNORMAL LOW (ref 39–117)
BUN: 15 mg/dL (ref 6–23)
CHLORIDE: 104 meq/L (ref 96–112)
CO2: 30 meq/L (ref 19–32)
Calcium: 9.3 mg/dL (ref 8.4–10.5)
Creatinine, Ser: 0.88 mg/dL (ref 0.40–1.20)
GFR: 68.51 mL/min (ref >60.00)
GLUCOSE: 95 mg/dL (ref 70–99)
POTASSIUM: 3.8 meq/L (ref 3.5–5.1)
Sodium: 140 mEq/L (ref 135–145)
Total Bilirubin: 0.5 mg/dL (ref 0.2–1.2)
Total Protein: 7 g/dL (ref 6.0–8.3)

## 2015-09-13 LAB — LIPID PANEL
CHOL/HDL RATIO: 3
Cholesterol: 218 mg/dL — ABNORMAL HIGH (ref 0–200)
HDL: 81.3 mg/dL (ref >39.00)
LDL Cholesterol: 118 mg/dL — ABNORMAL HIGH (ref 0–99)
NONHDL: 136.96
Triglycerides: 95 mg/dL (ref 0.0–149.0)
VLDL: 19 mg/dL (ref 0.0–40.0)

## 2015-09-13 LAB — CBC
HEMATOCRIT: 39.1 % (ref 36.0–46.0)
HEMOGLOBIN: 12.9 g/dL (ref 12.0–15.0)
MCHC: 33 g/dL (ref 30.0–36.0)
MCV: 91.2 fl (ref 78.0–100.0)
Platelets: 204 10*3/uL (ref 150.0–400.0)
RBC: 4.29 Mil/uL (ref 3.87–5.11)
RDW: 12.5 % (ref 11.5–15.5)
WBC: 5.3 10*3/uL (ref 4.0–10.5)

## 2015-09-13 LAB — TSH: TSH: 2.18 u[IU]/mL (ref 0.35–4.50)

## 2015-09-13 LAB — VITAMIN D 25 HYDROXY (VIT D DEFICIENCY, FRACTURES): VITD: 46.53 ng/mL (ref 30.00–100.00)

## 2015-09-13 MED ORDER — CEPHALEXIN 500 MG PO CAPS: 500.0000 mg | ORAL_CAPSULE | Freq: Three times a day (TID) | ORAL | Status: DC

## 2015-09-13 NOTE — Progress Notes (Signed)
Pre visit review using our clinic review tool, if applicable. No additional management support is needed unless otherwise documented below in the visit note. 

## 2015-09-13 NOTE — Patient Instructions (Addendum)
1. Recommend stopping neosporin to see if it is a possible allergic reaction causing your swelling 2. Switch to witch hazel instead. This will help prevent infection 3. Monitor symptoms such as fever, severe swelling, or not feeling well for possible infection. If you experience these symptoms you should take your prescribed antibiotic  Rotator Cuff Injury Rotator cuff injury is any type of injury to the set of muscles and tendons that make up the stabilizing unit of your shoulder. This unit holds the ball of your upper arm bone (humerus) in the socket of your shoulder blade (scapula).  CAUSES Injuries to your rotator cuff most commonly come from sports or activities that cause your arm to be moved repeatedly over your head. Examples of this include throwing, weight lifting, swimming, or racquet sports. Long lasting (chronic) irritation of your rotator cuff can cause soreness and swelling (inflammation), bursitis, and eventual damage to your tendons, such as a tear (rupture). SIGNS AND SYMPTOMS Acute rotator cuff tear:  Sudden tearing sensation followed by severe pain shooting from your upper shoulder down your arm toward your elbow.  Decreased range of motion of your shoulder because of pain and muscle spasm.  Severe pain.  Inability to raise your arm out to the side because of pain and loss of muscle power (large tears). Chronic rotator cuff tear:  Pain that usually is worse at night and may interfere with sleep.  Gradual weakness and decreased shoulder motion as the pain worsens.  Decreased range of motion. Rotator cuff tendinitis:  Deep ache in your shoulder and the outside upper arm over your shoulder.  Pain that comes on gradually and becomes worse when lifting your arm to the side or turning it inward. DIAGNOSIS Rotator cuff injury is diagnosed through a medical history, physical exam, and imaging exam. The medical history helps determine the type of rotator cuff injury. Your  health care provider will look at your injured shoulder, feel the injured area, and ask you to move your shoulder in different positions. X-ray exams typically are done to rule out other causes of shoulder pain, such as fractures. MRI is the exam of choice for the most severe shoulder injuries because the images show muscles and tendons.  TREATMENT  Chronic tear:  Medicine for pain, such as acetaminophen or ibuprofen.  Physical therapy and range-of-motion exercises may be helpful in maintaining shoulder function and strength.  Steroid injections into your shoulder joint.  Surgical repair of the rotator cuff if the injury does not heal with noninvasive treatment. Acute tear:  Anti-inflammatory medicines such as ibuprofen and naproxen to help reduce pain and swelling.  A sling to help support your arm and rest your rotator cuff muscles. Long-term use of a sling is not advised. It may cause significant stiffening of the shoulder joint.  Surgery may be considered within a few weeks, especially in younger, active people, to return the shoulder to full function.  Indications for surgical treatment include the following:  Age younger than 27 years.  Rotator cuff tears that are complete.  Physical therapy, rest, and anti-inflammatory medicines have been used for 6-8 weeks, with no improvement.  Employment or sporting activity that requires constant shoulder use. Tendinitis:  Anti-inflammatory medicines such as ibuprofen and naproxen to help reduce pain and swelling.  A sling to help support your arm and rest your rotator cuff muscles. Long-term use of a sling is not advised. It may cause significant stiffening of the shoulder joint.  Severe tendinitis may require:  Steroid injections into your shoulder joint.  Physical therapy.  Surgery. HOME CARE INSTRUCTIONS   Apply ice to your injury:  Put ice in a plastic bag.  Place a towel between your skin and the bag.  Leave the ice  on for 20 minutes, 2-3 times a day.  If you have a shoulder immobilizer (sling and straps), wear it until told otherwise by your health care provider.  You may want to sleep on several pillows or in a recliner at night to lessen swelling and pain.  Only take over-the-counter or prescription medicines for pain, discomfort, or fever as directed by your health care provider.  Do simple hand squeezing exercises with a soft rubber ball to decrease hand swelling. SEEK MEDICAL CARE IF:   Your shoulder pain increases, or new pain or numbness develops in your arm, hand, or fingers.  Your hand or fingers are colder than your other hand. SEEK IMMEDIATE MEDICAL CARE IF:   Your arm, hand, or fingers are numb or tingling.  Your arm, hand, or fingers are increasingly swollen and painful, or they turn white or blue. MAKE SURE YOU:  Understand these instructions.  Will watch your condition.  Will get help right away if you are not doing well or get worse.   This information is not intended to replace advice given to you by your health care provider. Make sure you discuss any questions you have with your health care provider.   Document Released: 10/24/2000 Document Revised: 11/01/2013 Document Reviewed: 06/08/2013 Elsevier Interactive Patient Education [employer].

## 2015-09-14 ENCOUNTER — Encounter: Payer: Self-pay | Admitting: Family Medicine

## 2015-09-14 ENCOUNTER — Ambulatory Visit: Payer: Medicare Other | Admitting: Physical Therapy

## 2015-09-14 ENCOUNTER — Ambulatory Visit: Payer: Managed Care, Other (non HMO) | Admitting: Family Medicine

## 2015-09-14 DIAGNOSIS — M25611 Stiffness of right shoulder, not elsewhere classified: Secondary | ICD-10-CM

## 2015-09-14 DIAGNOSIS — M6281 Muscle weakness (generalized): Secondary | ICD-10-CM

## 2015-09-14 DIAGNOSIS — M25511 Pain in right shoulder: Secondary | ICD-10-CM | POA: Diagnosis not present

## 2015-09-14 DIAGNOSIS — R293 Abnormal posture: Secondary | ICD-10-CM | POA: Diagnosis not present

## 2015-09-14 NOTE — Therapy (Signed)
[facility] [address]  [place], [place], [address] Phone: [phone]   Fax:  [phone]  Physical Therapy Treatment  Patient Details  Name: Stephanie Barnes MRN: [id number] Date of Birth: [birth year] Referring Provider: Susa Day, MD  Encounter Date: 09/14/2015      PT End of Session - 09/14/15 1103    Visit Number 10   Number of Visits 16   Date for PT Re-Evaluation 10/10/15   PT Start Time 1020   PT Stop Time 1106   PT Time Calculation (min) 46 min   Activity Tolerance Patient limited by pain   Behavior During Therapy [facility] for tasks assessed/performed      Past Medical History  Diagnosis Date  . Anxiety   . Arthritis   . Depression   . Depression with anxiety 12/02/2012    Past Surgical History  Procedure Laterality Date  . Tonsillectomy    . Dental surgery      Root cleaning   . Refractive surgery Bilateral 2000    There were no vitals filed for this visit.  Visit Diagnosis:  Pain of right shoulder region  Stiffness of right shoulder joint  Muscle right arm weakness  Posture abnormality      Subjective Assessment - 09/14/15 1054    Subjective Patient reporting pain and irritation after incident with trauma to right shoulder from falling folding attic ladder earlier this continues to worsen, wtih patient spending the majority of her day attempting to manange pain. Reports needing increased pan meds, increased use of ice and heat, and only relief seems to be when she goes to bed at night. Wakes up with minimal pain, but pain progressively worsens as the day progresses. Increased diffculty reported with all selfcare amd ADL's. Patient saw PCP yesterday while in office for labwork for upcoming physical, at which time PCP assessed eye and right shoulder trauma from incident with attic ladder. X-rays completed with no acute bony abnormality. PCP contacted Dr. Tonita Cong to notify him of new  developments; patietn awaiting response from Dr. Tonita Cong.   Diagnostic tests 09/13/15 X-ray right shoulder: AC degenerative change and spurring which may cause rotator cuff impingement. No acute bony abnormality.   Currently in Pain? Yes   Pain Score 4             [facility] PT Assessment - 09/14/15 1020    Observation/Other Assessments   Focus on Therapeutic Outcomes (FOTO)  Shoulder - 19% (81% limitation)               [facility] Adult PT Treatment/Exercise - 09/14/15 1020    Exercises   Exercises Shoulder   Modalities   Modalities Vasopneumatic   Vasopneumatic   Number Minutes Vasopneumatic  15 minutes   Vasopnuematic Location  Shoulder   Vasopneumatic Pressure Low   Vasopneumatic Temperature  Lowest   Manual Therapy   Manual Therapy Joint mobilization;Soft tissue mobilization;Scapular mobilization   Joint Mobilization Grade I-II right shoulder distraction, inf and A/P glides to patient tolerance   Soft tissue mobilization TPR right UT & biceps; Gentle STM to right biceps, posterior deltoid, upper trap and pecs               PT Short Term Goals - 08/22/15 1052    PT SHORT TERM GOAL #1   Title Indpendent with initial HEP (09/05/15)   Status Achieved           PT Long Term Goals - 09/14/15  [place] #1   Title Independent with advanced HEP (10/10/15)   Status On-going   PT LONG TERM GOAL #2   Title Patient will demonstrate right shoulder ROM within 15 degrees of left without increased pain for improved functional reach (10/10/15)   Status On-going   PT LONG TERM GOAL #3   Title Right shoulder strength 4/5 or greater for improved shoulder stability during functional tasks (10/10/15)   Status On-going   PT LONG TERM GOAL #4   Title Patient will report pain at worst no greater than 5/10 in right shoulder (10/10/15)   Status On-going   PT LONG TERM GOAL #5   Title Patient will report functional use of right UE during self care ADLs and daily  activities (ie. cooking) without increased pain (10/10/15)   Status On-going               Plan - 10-09-2015 1134    Clinical Impression Statement Patient reporting worsening pain intensity and decreased functional movement and use of right arm due to pain since incident with falling attic ladder earlier this week. Assessed by PCP yesterday who referred patient back to Dr. Tonita Cong (patient awaiting response from MD office). Due to recent events and resultant increased pain, FOTO functional outcome measure and associated G-Code demonstrating a significant decline for the 10th visit. Patient with limited tolerance for therapy today with primary focus on STM, TPR, and gentle joint mobs to patient tolerance to reduce pain, followed by vasopnuematic cryotherapy.   PT Next Visit Plan Pending pain levels and follow-up with Dr. Tonita Cong - Review HEP with upgrade as appropriate; postural training, manual therapy, gentle shoulder ROM/strengthening, modalities PRN   Consulted and Agree with Plan of Care Patient          G-Codes - 10-09-15 03/05/05    Functional Assessment Tool Used Shoulder FOTO = 19% (81% limitation) - Patient improving until this week when experienced a major setback resulting from new trauma to right shoudler from falling folding attic ladder   Functional Limitation Carrying, moving and handling objects   Carrying, Moving and Handling Objects Current Status (I4332) At least 80 percent but less than 100 percent impaired, limited or restricted   Carrying, Moving and Handling Objects Goal Status (R5188) At least 20 percent but less than 40 percent impaired, limited or restricted      Problem List Patient Active Problem List   Diagnosis Date Noted  . Cervical disc disorder with radiculopathy of cervical region 07/17/2015  . Colon polyp   tubular adenoma  03/2014 03/22/2014  . H/O bone density study 07/09/2013  . Hyperlipidemia 06/13/2013  . Abnormal EKG 06/13/2013  . Osteopenia  06/13/2013  . Insomnia 06/09/2013  . 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 Anxiety 12/02/2012  . DJD (degenerative joint disease) 12/02/2012  . GERD (gastroesophageal reflux disease) 12/02/2012  . Eczema 12/02/2012  . Tendinitis of hand 12/02/2012    Percival Spanish, PT, MPT October 09, 2015, 12:12 PM  [facility] [address]  [place], [place], [address] Phone: [phone]   Fax:  [phone]  Name: Stephanie Barnes MRN: [id number] Date of Birth: Aug 25, 1951

## 2015-09-17 ENCOUNTER — Encounter: Payer: Self-pay | Admitting: Family Medicine

## 2015-09-18 ENCOUNTER — Ambulatory Visit: Payer: Medicare Other | Admitting: Physical Therapy

## 2015-09-20 ENCOUNTER — Ambulatory Visit: Payer: Managed Care, Other (non HMO) | Admitting: Family Medicine

## 2015-09-21 ENCOUNTER — Ambulatory Visit: Payer: Medicare Other | Admitting: Physical Therapy

## 2015-09-21 DIAGNOSIS — M25511 Pain in right shoulder: Secondary | ICD-10-CM | POA: Diagnosis not present

## 2015-09-21 DIAGNOSIS — M7541 Impingement syndrome of right shoulder: Secondary | ICD-10-CM | POA: Diagnosis not present

## 2015-09-21 DIAGNOSIS — M6281 Muscle weakness (generalized): Secondary | ICD-10-CM

## 2015-09-21 DIAGNOSIS — M7501 Adhesive capsulitis of right shoulder: Secondary | ICD-10-CM | POA: Diagnosis not present

## 2015-09-21 DIAGNOSIS — M25611 Stiffness of right shoulder, not elsewhere classified: Secondary | ICD-10-CM

## 2015-09-21 DIAGNOSIS — R293 Abnormal posture: Secondary | ICD-10-CM

## 2015-09-21 NOTE — Therapy (Signed)
[facility] [address]  [place], [place], [address] Phone: [phone]   Fax:  [phone]  Physical Therapy Treatment  Patient Details  Name: Stephanie Barnes MRN: [id number] Date of Birth: [birth year] Referring Provider: Susa Day, MD  Encounter Date: 09/21/2015      PT End of Session - 09/21/15 1033    Visit Number 11   Number of Visits 16   Date for PT Re-Evaluation 10/10/15   PT Start Time 1021   PT Stop Time 1118   PT Time Calculation (min) 57 min   Activity Tolerance Patient limited by pain   Behavior During Therapy [facility] for tasks assessed/performed      Past Medical History  Diagnosis Date  . Anxiety   . Arthritis   . Depression   . Depression with anxiety 12/02/2012    Past Surgical History  Procedure Laterality Date  . Tonsillectomy    . Dental surgery      Root cleaning   . Refractive surgery Bilateral 2000    There were no vitals filed for this visit.  Visit Diagnosis:  Pain of right shoulder region  Stiffness of right shoulder joint  Muscle right arm weakness  Posture abnormality      Subjective Assessment - 09/21/15 1027    Subjective Patient arrives to therapy having just come from seeing Dr. Tonita Cong. MD placed her in shoulder immobilizer sling and ordered MRI which is scheduled for 10/03/15 after which she will f/u with Dr. Tonita Cong on 10/09/15. She was also referred to see a pain management specialist at [facility] (Dr. Suella Broad) on 10/16/15 pending results of MRI. Patient states MD instructed her to continue PT as tolerated and stopping any activity that is painful. Patient states pain has been worse over the past few days and only finds reflief from epsom salt bath or lying in bed at night.   Currently in Pain? Yes   Pain Score 5    Pain Location Shoulder   Pain Orientation Right  Generalized   Pain Descriptors / Indicators Sharp;Aching;Throbbing;Burning;Tingling    Pain Radiating Towards Radiation into upper arm with occasion tingling burning in rigt hand            [facility] PT Assessment - 09/21/15 1021    Assessment   Next MD Visit 10/09/15 Dr. Tonita Cong; 10/16/15 - Dr. Nelva Bush (pain mgt)               [facility] Adult PT Treatment/Exercise - 09/21/15 1021    Exercises   Exercises Shoulder   Shoulder Exercises: Standing   Other Standing Exercises Pendulum exercises - fwd/back, side/side, CW/CCW 2x60" each   Modalities   Modalities Vasopneumatic   Vasopneumatic   Number Minutes Vasopneumatic  15 minutes   Vasopnuematic Location  Shoulder   Vasopneumatic Pressure Low   Vasopneumatic Temperature  Lowest   Manual Therapy   Manual Therapy Joint mobilization;Soft tissue mobilization;Scapular mobilization   Joint Mobilization Grade I-II right shoulder distraction, inf and A/P glides to patient tolerance   Soft tissue mobilization TPR right UT; Gentle STM to right biceps, posterior deltoid, upper trap and pecs                PT Education - 09/21/15 1208    Education provided Yes   Education Details Sling application; Pendulum exercises for ROM and pain relief   Person(s) Educated Patient   Methods Explanation;Demonstration;Handout   Comprehension Verbalized understanding;Returned demonstration  PT Short Term Goals - 08/22/15 1052    PT SHORT TERM GOAL #1   Title Indpendent with initial HEP (09/05/15)   Status Achieved           PT Long Term Goals - 09/21/15 1210    PT LONG TERM GOAL #1   Title Independent with advanced HEP (10/10/15)   Status On-going   PT LONG TERM GOAL #2   Title Patient will demonstrate right shoulder ROM within 15 degrees of left without increased pain for improved functional reach (10/10/15)   Status On-going   PT LONG TERM GOAL #3   Title Right shoulder strength 4/5 or greater for improved shoulder stability during functional tasks (10/10/15)   Status On-going   PT LONG TERM GOAL #4   Title  Patient will report pain at worst no greater than 5/10 in right shoulder (10/10/15)   Status On-going   PT LONG TERM GOAL #5   Title Patient will report functional use of right UE during self care ADLs and daily activities (ie. cooking) without increased pain (10/10/15)   Status On-going               Plan - 09/21/15 1210    Clinical Impression Statement Patient reporting worsening pain intensity over past few days with relief only achieve from epsom salt baths and lying in bed. Patient placed in sling today at MD office and scheduled for MRI to determine if new injury resulting from trauma to shoulder last week. Patient continues to report poor functional movement and very limited use of right arm due to pain. Has only been completeting postural HEP exercises as these are the only one she can complete without pain and actually notes some relief of pain while performing exercises. Encouraged continued completion of painfree exercises throughout the day with pendulum exercises added to HEP for pain relief and to help maintain movement/ROM in shoulder. Remainder of therapy focusing on manual therapy for pain reduction and TPR with patient hypersensitive to touch and movement.   PT Next Visit Plan Postural training, manual therapy, gentle shoulder ROM as tolerated, modalities PRN   Consulted and Agree with Plan of Care Patient        Problem List Patient Active Problem List   Diagnosis Date Noted  . Cervical disc disorder with radiculopathy of cervical region 07/17/2015  . Colon polyp   tubular adenoma  03/2014 03/22/2014  . H/O bone density study 07/09/2013  . Hyperlipidemia 06/13/2013  . Abnormal EKG 06/13/2013  . Osteopenia 06/13/2013  . Insomnia 06/09/2013  . 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 Anxiety 12/02/2012  . DJD (degenerative joint disease) 12/02/2012  . GERD (gastroesophageal reflux disease) 12/02/2012  . Eczema 12/02/2012  . Tendinitis of hand 12/02/2012     Percival Spanish, PT, MPT 09/21/2015, 12:23 PM  [facility] [address]  [place], [place], [address] Phone: [phone]   Fax:  [phone]  Name: Stephanie Barnes MRN: [id number] Date of Birth: [birth year]

## 2015-09-23 DIAGNOSIS — L03211 Cellulitis of face: Secondary | ICD-10-CM | POA: Insufficient documentation

## 2015-09-23 DIAGNOSIS — M7500 Adhesive capsulitis of unspecified shoulder: Secondary | ICD-10-CM | POA: Insufficient documentation

## 2015-09-23 HISTORY — DX: Adhesive capsulitis of unspecified shoulder: M75.00

## 2015-09-23 NOTE — Assessment & Plan Note (Signed)
Near right eye, started on Cefdinir. Report if does not improve.

## 2015-09-23 NOTE — Assessment & Plan Note (Signed)
Xray confirms arthropathy but no fracture despite fall referred to orthopaedics for evaluation

## 2015-09-23 NOTE — Progress Notes (Signed)
Subjective:    Patient ID: Stephanie Barnes, female    DOB: 12-Apr-1951, 64 y.o.   MRN: [id number]  Chief Complaint  Patient presents with  . Eye Pain    right eye swollen and red    HPI Patient is in today for evaluation of some swelling and irritation below right eye. She fell earlier in the week and hit her face was improving but now is red warm and swollen. Mildly uncomfortable. No visual changes. No fevers. No hearing concerns, headache or other neurologic complaints. Does continue to struggle with right shoulder pain and it has worsened since the fall. Has seen acupuncturist in orthopedics in the past with no resolution but some improvement temporarily. Finally note some mild nasal congestion intermittently but no fevers or chills and sputum is always clear. Denies CP/palp/SOB/HA/fevers/GI or GU c/o. Taking meds as prescribed  Past Medical History  Diagnosis Date  . Anxiety   . Arthritis   . Depression   . Depression with anxiety 12/02/2012    Past Surgical History  Procedure Laterality Date  . Tonsillectomy    . Dental surgery      Root cleaning   . Refractive surgery Bilateral 2000    Family History  Problem Relation Age of Onset  . Heart disease Mother   . Arthritis Mother   . Heart disease Father   . COPD Father   . Mental illness Brother     bipolar  . Hypertension Brother   . Suicidality Brother   . Alcohol abuse Brother   . Heart disease Maternal Aunt   . Heart disease Maternal Uncle   . Heart disease Paternal Aunt   . COPD Paternal 81   . Heart disease Paternal Uncle   . Heart disease Maternal Grandmother   . Heart disease Maternal Grandfather   . Heart disease Paternal Grandmother   . Heart disease Paternal Grandfather     Social History   Social History  . Marital Status: Single    Spouse Name: N/A  . Number of Children: N/A  . Years of Education: N/A   Occupational History  . Not on file.   Social History Main Topics  . Smoking status:  Never Smoker   . Smokeless tobacco: Never Used  . Alcohol Use: 0.6 oz/week    1 Glasses of wine per week     Comment: per month  . Drug Use: No  . Sexual Activity: No     Comment: lives alone, retired from [employer], no dietary restrictions   Other Topics Concern  . Not on file   Social History Narrative    Outpatient Prescriptions Prior to Visit  Medication Sig Dispense Refill  . buPROPion (WELLBUTRIN) 75 MG tablet 1/4 tablet daily 30 tablet 6  . Calcium Carbonate-Vit D-Min (CALCIUM 1200 PO) Take 1 tablet by mouth 2 (two) times daily.    . clonazePAM (KLONOPIN) 1 MG tablet TAKE ONE-HALF TO ONE TABLET BY MOUTH TWICE DAILY AS NEEDED FOR ANXIETY 30 tablet 1  . ibuprofen (ADVIL,MOTRIN) 800 MG tablet Take 800 mg by mouth 3 (three) times daily.    . mometasone (ELOCON) 0.1 % ointment Apply topically daily. As needed 15 g 0  . Omega-3 Fatty Acids (FISH OIL) 1000 MG CAPS Take by mouth 3 (three) times daily.    . pantoprazole (PROTONIX) 40 MG tablet Take 1 tablet (40 mg total) by mouth daily. 90 tablet 2  . Probiotic Product (PROBIOTIC DAILY PO) Take by mouth.    Marland Kitchen  cetirizine (ZYRTEC) 10 MG tablet Take 10 mg by mouth daily as needed.     . traMADol (ULTRAM) 50 MG tablet Take 1 tablet (50 mg total) by mouth every 8 (eight) hours as needed. (Patient not taking: Reported on 09/13/2015) 30 tablet 0  . gabapentin (NEURONTIN) 100 MG capsule Take 1 capsule (100 mg total) by mouth 3 (three) times daily. (Patient not taking: Reported on 08/15/2015) 90 capsule 3   No facility-administered medications prior to visit.    Allergies  Allergen Reactions  . Aspirin Swelling  . Celebrex [Celecoxib] Anaphylaxis  . Celexa [Citalopram]   . Clindamycin/Lincomycin   . Doxycycline   . Fosamax [Alendronate Sodium]   . Other     Arthritec  . Prednisone     Review of Systems  Constitutional: Positive for malaise/fatigue. Negative for fever.  HENT: Negative for congestion.   Eyes: Negative for  discharge.  Respiratory: Negative for shortness of breath.   Cardiovascular: Negative for chest pain, palpitations and leg swelling.  Gastrointestinal: Negative for nausea and abdominal pain.  Genitourinary: Negative for dysuria.  Musculoskeletal: Positive for joint pain. Negative for falls.  Skin: Negative for rash.  Neurological: Positive for headaches. Negative for loss of consciousness.  Endo/Heme/Allergies: Negative for environmental allergies.  Psychiatric/Behavioral: Negative for depression. The patient is not nervous/anxious.        Objective:    Physical Exam  Constitutional: She is oriented to person, place, and time. She appears well-developed and well-nourished. No distress.  HENT:  Head: Normocephalic and atraumatic.  Nose: Nose normal.  Eyes: Conjunctivae and EOM are normal. Pupils are equal, round, and reactive to light. Right eye exhibits no discharge. Left eye exhibits no discharge. No scleral icterus.  Neck: Normal range of motion. Neck supple.  Cardiovascular: Normal rate and regular rhythm.   No murmur heard. Pulmonary/Chest: Effort normal and breath sounds normal.  Abdominal: Soft. Bowel sounds are normal. There is no tenderness.  Musculoskeletal: She exhibits no edema.  Neurological: She is alert and oriented to person, place, and time.  Skin: Skin is warm and dry. There is erythema.  Swelling, erythema below right eye.   Psychiatric: She has a normal mood and affect.  Nursing note and vitals reviewed.   BP 132/86 mmHg  Pulse 70  Temp(Src) 98 F (36.7 C) (Oral)  Ht 4' 11.5" (1.511 m)  Wt 133 lb 6 oz (60.499 kg)  BMI 26.50 kg/m2  SpO2 99% Wt Readings from Last 3 Encounters:  09/13/15 133 lb 6 oz (60.499 kg)  07/23/15 132 lb (59.875 kg)  07/17/15 132 lb 2 oz (59.932 kg)     Lab Results  Component Value Date   WBC 5.3 09/13/2015   HGB 12.9 09/13/2015   HCT 39.1 09/13/2015   PLT 204.0 09/13/2015   GLUCOSE 95 09/13/2015   CHOL 218* 09/13/2015     TRIG 95.0 09/13/2015   HDL 81.30 09/13/2015   LDLCALC 118* 09/13/2015   ALT 12 09/13/2015   AST 12 09/13/2015   NA 140 09/13/2015   K 3.8 09/13/2015   CL 104 09/13/2015   CREATININE 0.88 09/13/2015   BUN 15 09/13/2015   CO2 30 09/13/2015   TSH 2.18 09/13/2015    Lab Results  Component Value Date   TSH 2.18 09/13/2015   Lab Results  Component Value Date   WBC 5.3 09/13/2015   HGB 12.9 09/13/2015   HCT 39.1 09/13/2015   MCV 91.2 09/13/2015   PLT 204.0 09/13/2015   Lab Results  Component Value Date   NA 140 09/13/2015   K 3.8 09/13/2015   CO2 30 09/13/2015   GLUCOSE 95 09/13/2015   BUN 15 09/13/2015   CREATININE 0.88 09/13/2015   BILITOT 0.5 09/13/2015   ALKPHOS 34* 09/13/2015   AST 12 09/13/2015   ALT 12 09/13/2015   PROT 7.0 09/13/2015   ALBUMIN 4.1 09/13/2015   CALCIUM 9.3 09/13/2015   GFR 68.51 09/13/2015   Lab Results  Component Value Date   CHOL 218* 09/13/2015   Lab Results  Component Value Date   HDL 81.30 09/13/2015   Lab Results  Component Value Date   LDLCALC 118* 09/13/2015   Lab Results  Component Value Date   TRIG 95.0 09/13/2015   Lab Results  Component Value Date   CHOLHDL 3 09/13/2015   No results found for: HGBA1C     Assessment & Plan:   Problem List Items Addressed This Visit    Pain in joint of right shoulder    Xray confirms arthropathy but no fracture despite fall referred to orthopaedics for evaluation      Relevant Orders   DG Shoulder Right (Completed)   Insomnia    Encouraged good sleep hygiene such as dark, quiet room. No blue/green glowing lights such as computer screens in bedroom. No alcohol or stimulants in evening. Cut down on caffeine as able. Regular exercise is helpful but not just prior to bed time.       Hyperlipidemia    Encouraged heart healthy diet, increase exercise, avoid trans fats, consider a krill oil cap daily      GERD (gastroesophageal reflux disease)    Avoid offending foods, start  probiotics. Do not eat large meals in late evening and consider raising head of bed.       Cellulitis of face - Primary    Near right eye, started on Cefdinir. Report if does not improve.      Relevant Medications   cephALEXin (KEFLEX) 500 MG capsule    Other Visit Diagnoses    Right shoulder injury, subsequent encounter        Relevant Orders    Ambulatory referral to Orthopedic Surgery       I have discontinued Ms. Mundis's gabapentin. I am also having her start on cephALEXin. Additionally, I am having her maintain her Probiotic Product (PROBIOTIC DAILY PO), Fish Oil, Calcium Carbonate-Vit D-Min (CALCIUM 1200 PO), cetirizine, pantoprazole, mometasone, buPROPion, traMADol, clonazePAM, and ibuprofen.  Meds ordered this encounter  Medications  . cephALEXin (KEFLEX) 500 MG capsule    Sig: Take 1 capsule (500 mg total) by mouth 3 (three) times daily.    Dispense:  30 capsule    Refill:  0     Penni Homans, MD

## 2015-09-23 NOTE — Assessment & Plan Note (Signed)
Encouraged heart healthy diet, increase exercise, avoid trans fats, consider a krill oil cap daily 

## 2015-09-23 NOTE — Assessment & Plan Note (Signed)
Avoid offending foods, start probiotics. Do not eat large meals in late evening and consider raising head of bed.  

## 2015-09-23 NOTE — Assessment & Plan Note (Signed)
Encouraged good sleep hygiene such as dark, quiet room. No blue/green glowing lights such as computer screens in bedroom. No alcohol or stimulants in evening. Cut down on caffeine as able. Regular exercise is helpful but not just prior to bed time.  

## 2015-09-24 ENCOUNTER — Other Ambulatory Visit: Payer: Self-pay | Admitting: Family Medicine

## 2015-09-24 ENCOUNTER — Ambulatory Visit: Payer: Private Health Insurance - Indemnity | Admitting: Family Medicine

## 2015-09-24 DIAGNOSIS — M5010 Cervical disc disorder with radiculopathy, unspecified cervical region: Secondary | ICD-10-CM

## 2015-09-24 MED ORDER — CLONAZEPAM 1 MG PO TABS: ORAL_TABLET | ORAL | Status: DC

## 2015-09-24 MED ORDER — TRAMADOL HCL 50 MG PO TABS: 50.0000 mg | ORAL_TABLET | Freq: Three times a day (TID) | ORAL | Status: DC | PRN

## 2015-09-24 NOTE — Telephone Encounter (Signed)
Printed and on counter for signature. Patient informed will fax in the morning once signed by PCP to [place].

## 2015-09-24 NOTE — Telephone Encounter (Signed)
Caller name:Zeba Relation to pt: self Call back [phone] Pharmacy: [facility]  Reason for call: Pt came in office stating had appt for today, appt was canceled but pt states need rx for clonazePAM (KLONOPIN) 1 MG tablet, pt has her last med for today and is needing it for tomorrow and also her rx for traMADol (ULTRAM) 50 MG tablet. Please advise.

## 2015-09-24 NOTE — Telephone Encounter (Signed)
OK to refill both meds for patient with 1 refill on the Clonazepam

## 2015-09-24 NOTE — Telephone Encounter (Signed)
Requesting:  Tramadol and Clonazepam Contract  NONE UDS   NONE Last OV    09/13/2015 Last Refill   Tramadol  #30 with 0 refills on 07/17/2015                     Clonazepam  #30 with 1 refill on 07/27/2015   Please Advise

## 2015-09-25 ENCOUNTER — Encounter: Payer: Self-pay | Admitting: Family Medicine

## 2015-09-25 ENCOUNTER — Telehealth: Payer: Self-pay | Admitting: Family Medicine

## 2015-09-25 NOTE — Telephone Encounter (Signed)
Called back the pharmacy they needed verification that both tramadol and clonazepam came from our office.

## 2015-09-25 NOTE — Telephone Encounter (Signed)
Caller name: Hildred Alamin from [facility] Can be reached: [phone]  Reason for call: Please call to verify the 2 RXs for controlled meds.

## 2015-10-01 ENCOUNTER — Encounter: Payer: Self-pay | Admitting: Behavioral Health

## 2015-10-01 ENCOUNTER — Telehealth: Payer: Self-pay | Admitting: Behavioral Health

## 2015-10-01 NOTE — Telephone Encounter (Signed)
Pre-Visit Call completed with patient and chart updated.   Pre-Visit Info documented in Specialty Comments under [employer].    

## 2015-10-02 ENCOUNTER — Encounter: Payer: Self-pay | Admitting: Family Medicine

## 2015-10-02 ENCOUNTER — Ambulatory Visit (INDEPENDENT_AMBULATORY_CARE_PROVIDER_SITE_OTHER): Payer: Medicare Other | Admitting: Family Medicine

## 2015-10-02 ENCOUNTER — Ambulatory Visit: Payer: Medicare Other | Admitting: Physical Therapy

## 2015-10-02 VITALS — BP 118/78 | HR 69 | Temp 97.9°F | Ht 59.5 in | Wt 134.4 lb

## 2015-10-02 DIAGNOSIS — E785 Hyperlipidemia, unspecified: Secondary | ICD-10-CM | POA: Diagnosis not present

## 2015-10-02 DIAGNOSIS — M25611 Stiffness of right shoulder, not elsewhere classified: Secondary | ICD-10-CM | POA: Diagnosis not present

## 2015-10-02 DIAGNOSIS — K219 Gastro-esophageal reflux disease without esophagitis: Secondary | ICD-10-CM

## 2015-10-02 DIAGNOSIS — H547 Unspecified visual loss: Secondary | ICD-10-CM

## 2015-10-02 DIAGNOSIS — M8580 Other specified disorders of bone density and structure, unspecified site: Secondary | ICD-10-CM

## 2015-10-02 DIAGNOSIS — M6281 Muscle weakness (generalized): Secondary | ICD-10-CM

## 2015-10-02 DIAGNOSIS — R293 Abnormal posture: Secondary | ICD-10-CM | POA: Diagnosis not present

## 2015-10-02 DIAGNOSIS — Z23 Encounter for immunization: Secondary | ICD-10-CM

## 2015-10-02 DIAGNOSIS — G4700 Insomnia, unspecified: Secondary | ICD-10-CM

## 2015-10-02 DIAGNOSIS — M25511 Pain in right shoulder: Secondary | ICD-10-CM | POA: Diagnosis not present

## 2015-10-02 DIAGNOSIS — R9431 Abnormal electrocardiogram [ECG] [EKG]: Secondary | ICD-10-CM

## 2015-10-02 DIAGNOSIS — Z0000 Encounter for general adult medical examination without abnormal findings: Secondary | ICD-10-CM | POA: Diagnosis not present

## 2015-10-02 HISTORY — DX: Encounter for general adult medical examination without abnormal findings: Z00.00

## 2015-10-02 LAB — VITAMIN D 25 HYDROXY (VIT D DEFICIENCY, FRACTURES): VITD: 51.05 ng/mL (ref 30.00–100.00)

## 2015-10-02 NOTE — Assessment & Plan Note (Signed)
Had an incident with a ladder from her attic hitting her shoulder is now following again with Dr Tonita Cong, has MRI scheduled for tomorrow. Continues to struggle with pain

## 2015-10-02 NOTE — Assessment & Plan Note (Addendum)
Patient denies any difficulties at home. No trouble with ADLs, depression or falls. See EMR for functional status screen and depression screen. No recent changes to vision or hearing. Is UTD with immunizations. Is UTD with screening. Advanced Directives in chart. Encouraged heart healthy diet, exercise as tolerated and adequate sleep. See patient's problem list for health risk factors to monitor. See AVS for preventative healthcare recommendation schedule. Flu shot given today Colonoscopy and UGI in October of 2015, Dr Collene Mares gastroenterology [facility] 2015 will wait til after shoulder surgery EKG today, likely will be preop for the surgery, given pneumonia shot   Immunization Status: Flu vaccine-- 07/31/14 Tdap-- Unknown; patient does not recall the last vaccine she received. PNA-- patient reported that she has not had this vaccine yet. Shingles-- 12/02/12  A/P:  Changes to [place], [place] or Personal Hx: UTD Pap-- 06/13/13 w/ Dr. Emi Belfast; normal MMG-- 08/24/14 [facility]/ 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 Juanita Craver at [facility]; one diminutive polyp in the mid ascending colon; removed; one polyp (5-6 mm) in the sigmoid colon; resected and retrieved; follow-up 5-10 years.  Care Teams Updated:  Arman Bogus, MS, L.Ac - Accupuncture  Annie Paras, MPT - Physical Therapy  Dr. Donnald Garre - Orthopedic Surgery  Dr. Juanita Craver - Gastroenterology  ED/Hospital/Urgent Care Visits: Per the patient, no recent visits to the ED/Hospital or Urgent Care.

## 2015-10-02 NOTE — Assessment & Plan Note (Signed)
Encouraged good sleep hygiene such as dark, quiet room. No blue/green glowing lights such as computer screens in bedroom. No alcohol or stimulants in evening. Cut down on caffeine as able. Regular exercise is helpful but not just prior to bed time. Using Tramadol prn due to shoulder pain lately

## 2015-10-02 NOTE — Patient Instructions (Signed)
Encouraged 1500 mg daily divided 3 x a day in diet and supplements, each calcium is roughly 590m Supplements should be calcium citrate (not carbonate)  Preventive Care for Adults, Female A healthy lifestyle and preventive care can promote health and wellness. Preventive health guidelines for women include the following key practices.  A routine yearly physical is a good way to check with your health care provider about your health and preventive screening. It is a chance to share any concerns and updates on your health and to receive a thorough exam.  Visit your dentist for a routine exam and preventive care every 6 months. Brush your teeth twice a day and floss once a day. Good oral hygiene prevents tooth decay and gum disease.  The frequency of eye exams is based on your age, health, family medical history, use of contact lenses, and other factors. Follow your health care provider's recommendations for frequency of eye exams.  Eat a healthy diet. Foods like vegetables, fruits, whole grains, low-fat dairy products, and lean protein foods contain the nutrients you need without too many calories. Decrease your intake of foods high in solid fats, added sugars, and salt. Eat the right amount of calories for you.Get information about a proper diet from your health care provider, if necessary.  Regular physical exercise is one of the most important things you can do for your health. Most adults should get at least 150 minutes of moderate-intensity exercise (any activity that increases your heart rate and causes you to sweat) each week. In addition, most adults need muscle-strengthening exercises on 2 or more days a week.  Maintain a healthy weight. The body mass index (BMI) is a screening tool to identify possible weight problems. It provides an estimate of body fat based on height and weight. Your health care provider can find your BMI and can help you achieve or maintain a healthy weight.For adults  20 years and older:  A BMI below 18.5 is considered underweight.  A BMI of 18.5 to 24.9 is normal.  A BMI of 25 to 29.9 is considered overweight.  A BMI of 30 and above is considered obese.  Maintain normal blood lipids and cholesterol levels by exercising and minimizing your intake of saturated fat. Eat a balanced diet with plenty of fruit and vegetables. Blood tests for lipids and cholesterol should begin at age 3665and be repeated every 5 years. If your lipid or cholesterol levels are high, you are over 50, or you are at high risk for heart disease, you may need your cholesterol levels checked more frequently.Ongoing high lipid and cholesterol levels should be treated with medicines if diet and exercise are not working.  If you smoke, find out from your health care provider how to quit. If you do not use tobacco, do not start.  Lung cancer screening is recommended for adults aged 554-80years who are at high risk for developing lung cancer because of a history of smoking. A yearly low-dose CT scan of the lungs is recommended for people who have at least a 30-pack-year history of smoking and are a current smoker or have quit within the past 15 years. A pack year of smoking is smoking an average of 1 pack of cigarettes a day for 1 year (for example: 1 pack a day for 30 years or 2 packs a day for 15 years). Yearly screening should continue until the smoker has stopped smoking for at least 15 years. Yearly screening should be stopped for people  who develop a health problem that would prevent them from having lung cancer treatment.  If you are pregnant, do not drink alcohol. If you are breastfeeding, be very cautious about drinking alcohol. If you are not pregnant and choose to drink alcohol, do not have more than 1 drink per day. One drink is considered to be 12 ounces (355 mL) of beer, 5 ounces (148 mL) of wine, or 1.5 ounces (44 mL) of liquor.  Avoid use of street drugs. Do not share needles with  anyone. Ask for help if you need support or instructions about stopping the use of drugs.  High blood pressure causes heart disease and increases the risk of stroke. Your blood pressure should be checked at least every 1 to 2 years. Ongoing high blood pressure should be treated with medicines if weight loss and exercise do not work.  If you are 23-2 years old, ask your health care provider if you should take aspirin to prevent strokes.  Diabetes screening is done by taking a blood sample to check your blood glucose level after you have not eaten for a certain period of time (fasting). If you are not overweight and you do not have risk factors for diabetes, you should be screened once every 3 years starting at age 20. If you are overweight or obese and you are 79-81 years of age, you should be screened for diabetes every year as part of your cardiovascular risk assessment.  Breast cancer screening is essential preventive care for women. You should practice "breast self-awareness." This means understanding the normal appearance and feel of your breasts and may include breast self-examination. Any changes detected, no matter how small, should be reported to a health care provider. Women in their 21s and 30s should have a clinical breast exam (CBE) by a health care provider as part of a regular health exam every 1 to 3 years. After age 62, women should have a CBE every year. Starting at age 51, women should consider having a mammogram (breast X-ray test) every year. Women who have a family history of breast cancer should talk to their health care provider about genetic screening. Women at a high risk of breast cancer should talk to their health care providers about having an MRI and a mammogram every year.  Breast cancer gene (BRCA)-related cancer risk assessment is recommended for women who have family members with BRCA-related cancers. BRCA-related cancers include breast, ovarian, tubal, and peritoneal  cancers. Having family members with these cancers may be associated with an increased risk for harmful changes (mutations) in the breast cancer genes BRCA1 and BRCA2. Results of the assessment will determine the need for genetic counseling and BRCA1 and BRCA2 testing.  Your health care provider may recommend that you be screened regularly for cancer of the pelvic organs (ovaries, uterus, and vagina). This screening involves a pelvic examination, including checking for microscopic changes to the surface of your cervix (Pap test). You may be encouraged to have this screening done every 3 years, beginning at age 84.  For women ages 46-65, health care providers may recommend pelvic exams and Pap testing every 3 years, or they may recommend the Pap and pelvic exam, combined with testing for human papilloma virus (HPV), every 5 years. Some types of HPV increase your risk of cervical cancer. Testing for HPV may also be done on women of any age with unclear Pap test results.  Other health care providers may not recommend any screening for nonpregnant women who  are considered low risk for pelvic cancer and who do not have symptoms. Ask your health care provider if a screening pelvic exam is right for you.  If you have had past treatment for cervical cancer or a condition that could lead to cancer, you need Pap tests and screening for cancer for at least 20 years after your treatment. If Pap tests have been discontinued, your risk factors (such as having a new sexual partner) need to be reassessed to determine if screening should resume. Some women have medical problems that increase the chance of getting cervical cancer. In these cases, your health care provider may recommend more frequent screening and Pap tests.  Colorectal cancer can be detected and often prevented. Most routine colorectal cancer screening begins at the age of 65 years and continues through age 32 years. However, your health care provider may  recommend screening at an earlier age if you have risk factors for colon cancer. On a yearly basis, your health care provider may provide home test kits to check for hidden blood in the stool. Use of a small camera at the end of a tube, to directly examine the colon (sigmoidoscopy or colonoscopy), can detect the earliest forms of colorectal cancer. Talk to your health care provider about this at age 24, when routine screening begins. Direct exam of the colon should be repeated every 5-10 years through age 71 years, unless early forms of precancerous polyps or small growths are found.  People who are at an increased risk for hepatitis B should be screened for this virus. You are considered at high risk for hepatitis B if:  You were born in a country where hepatitis B occurs often. Talk with your health care provider about which countries are considered high risk.  Your parents were born in a high-risk country and you have not received a shot to protect against hepatitis B (hepatitis B vaccine).  You have HIV or AIDS.  You use needles to inject street drugs.  You live with, or have sex with, someone who has hepatitis B.  You get hemodialysis treatment.  You take certain medicines for conditions like cancer, organ transplantation, and autoimmune conditions.  Hepatitis C blood testing is recommended for all people born from [birth year] through 1966 and any individual with known risks for hepatitis C.  Practice safe sex. Use condoms and avoid high-risk sexual practices to reduce the spread of sexually transmitted infections (STIs). STIs include gonorrhea, chlamydia, syphilis, trichomonas, herpes, HPV, and human immunodeficiency virus (HIV). Herpes, HIV, and HPV are viral illnesses that have no cure. They can result in disability, cancer, and death.  You should be screened for sexually transmitted illnesses (STIs) including gonorrhea and chlamydia if:  You are sexually active and are younger than 24  years.  You are older than 24 years and your health care provider tells you that you are at risk for this type of infection.  Your sexual activity has changed since you were last screened and you are at an increased risk for chlamydia or gonorrhea. Ask your health care provider if you are at risk.  If you are at risk of being infected with HIV, it is recommended that you take a prescription medicine daily to prevent HIV infection. This is called preexposure prophylaxis (PrEP). You are considered at risk if:  You are sexually active and do not regularly use condoms or know the HIV status of your partner(s).  You take drugs by injection.  You are sexually active  with a partner who has HIV.  Talk with your health care provider about whether you are at high risk of being infected with HIV. If you choose to begin PrEP, you should first be tested for HIV. You should then be tested every 3 months for as long as you are taking PrEP.  Osteoporosis is a disease in which the bones lose minerals and strength with aging. This can result in serious bone fractures or breaks. The risk of osteoporosis can be identified using a bone density scan. Women ages 54 years and over and women at risk for fractures or osteoporosis should discuss screening with their health care providers. Ask your health care provider whether you should take a calcium supplement or vitamin D to reduce the rate of osteoporosis.  Menopause can be associated with physical symptoms and risks. Hormone replacement therapy is available to decrease symptoms and risks. You should talk to your health care provider about whether hormone replacement therapy is right for you.  Use sunscreen. Apply sunscreen liberally and repeatedly throughout the day. You should seek shade when your shadow is shorter than you. Protect yourself by wearing long sleeves, pants, a wide-brimmed hat, and sunglasses year round, whenever you are outdoors.  Once a month, do a  whole body skin exam, using a mirror to look at the skin on your back. Tell your health care provider of new moles, moles that have irregular borders, moles that are larger than a pencil eraser, or moles that have changed in shape or color.  Stay current with required vaccines (immunizations).  Influenza vaccine. All adults should be immunized every year.  Tetanus, diphtheria, and acellular pertussis (Td, Tdap) vaccine. Pregnant women should receive 1 dose of Tdap vaccine during each pregnancy. The dose should be obtained regardless of the length of time since the last dose. Immunization is preferred during the 27th-36th week of gestation. An adult who has not previously received Tdap or who does not know her vaccine status should receive 1 dose of Tdap. This initial dose should be followed by tetanus and diphtheria toxoids (Td) booster doses every 10 years. Adults with an unknown or incomplete history of completing a 3-dose immunization series with Td-containing vaccines should begin or complete a primary immunization series including a Tdap dose. Adults should receive a Td booster every 10 years.  Varicella vaccine. An adult without evidence of immunity to varicella should receive 2 doses or a second dose if she has previously received 1 dose. Pregnant females who do not have evidence of immunity should receive the first dose after pregnancy. This first dose should be obtained before leaving the health care facility. The second dose should be obtained 4-8 weeks after the first dose.  Human papillomavirus (HPV) vaccine. Females aged 13-26 years who have not received the vaccine previously should obtain the 3-dose series. The vaccine is not recommended for use in pregnant females. However, pregnancy testing is not needed before receiving a dose. If a female is found to be pregnant after receiving a dose, no treatment is needed. In that case, the remaining doses should be delayed until after the pregnancy.  Immunization is recommended for any person with an immunocompromised condition through the age of 32 years if she did not get any or all doses earlier. During the 3-dose series, the second dose should be obtained 4-8 weeks after the first dose. The third dose should be obtained 24 weeks after the first dose and 16 weeks after the second dose.  Zoster  vaccine. One dose is recommended for adults aged 25 years or older unless certain conditions are present.  Measles, mumps, and rubella (MMR) vaccine. Adults born before [birth year] generally are considered immune to measles and mumps. Adults born in [birth year] or later should have 1 or more doses of MMR vaccine unless there is a contraindication to the vaccine or there is laboratory evidence of immunity to each of the three diseases. A routine second dose of MMR vaccine should be obtained at least 28 days after the first dose for students attending postsecondary schools, health care workers, or international travelers. People who received inactivated measles vaccine or an unknown type of measles vaccine during 1963-1967 should receive 2 doses of MMR vaccine. People who received inactivated mumps vaccine or an unknown type of mumps vaccine before 1979 and are at high risk for mumps infection should consider immunization with 2 doses of MMR vaccine. For females of childbearing age, rubella immunity should be determined. If there is no evidence of immunity, females who are not pregnant should be vaccinated. If there is no evidence of immunity, females who are pregnant should delay immunization until after pregnancy. Unvaccinated health care workers born before [birth year] who lack laboratory evidence of measles, mumps, or rubella immunity or laboratory confirmation of disease should consider measles and mumps immunization with 2 doses of MMR vaccine or rubella immunization with 1 dose of MMR vaccine.  Pneumococcal 13-valent conjugate (PCV13) vaccine. When indicated, a person who is  uncertain of his immunization history and has no record of immunization should receive the PCV13 vaccine. All adults 87 years of age and older should receive this vaccine. An adult aged 11 years or older who has certain medical conditions and has not been previously immunized should receive 1 dose of PCV13 vaccine. This PCV13 should be followed with a dose of pneumococcal polysaccharide (PPSV23) vaccine. Adults who are at high risk for pneumococcal disease should obtain the PPSV23 vaccine at least 8 weeks after the dose of PCV13 vaccine. Adults older than 65 years of age who have normal immune system function should obtain the PPSV23 vaccine dose at least 1 year after the dose of PCV13 vaccine.  Pneumococcal polysaccharide (PPSV23) vaccine. When PCV13 is also indicated, PCV13 should be obtained first. All adults aged 43 years and older should be immunized. An adult younger than age 55 years who has certain medical conditions should be immunized. Any person who resides in a nursing home or long-term care facility should be immunized. An adult smoker should be immunized. People with an immunocompromised condition and certain other conditions should receive both PCV13 and PPSV23 vaccines. People with human immunodeficiency virus (HIV) infection should be immunized as soon as possible after diagnosis. Immunization during chemotherapy or radiation therapy should be avoided. Routine use of PPSV23 vaccine is not recommended for American Indians, [place] Natives, or people younger than 65 years unless there are medical conditions that require PPSV23 vaccine. When indicated, people who have unknown immunization and have no record of immunization should receive PPSV23 vaccine. One-time revaccination 5 years after the first dose of PPSV23 is recommended for people aged 19-64 years who have chronic kidney failure, nephrotic syndrome, asplenia, or immunocompromised conditions. People who received 1-2 doses of PPSV23 before age  43 years should receive another dose of PPSV23 vaccine at age 29 years or later if at least 5 years have passed since the previous dose. Doses of PPSV23 are not needed for people immunized with PPSV23 at or after age 97 years.  Meningococcal vaccine. Adults with asplenia or persistent complement component deficiencies should receive 2 doses of quadrivalent meningococcal conjugate (MenACWY-D) vaccine. The doses should be obtained at least 2 months apart. Microbiologists working with certain meningococcal bacteria, [place] recruits, people at risk during an outbreak, and people who travel to or live in countries with a high rate of meningitis should be immunized. A first-year college student up through age 63 years who is living in a residence hall should receive a dose if she did not receive a dose on or after her 16th birthday. Adults who have certain high-risk conditions should receive one or more doses of vaccine.  Hepatitis A vaccine. Adults who wish to be protected from this disease, have certain high-risk conditions, work with hepatitis A-infected animals, work in hepatitis A research labs, or travel to or work in countries with a high rate of hepatitis A should be immunized. Adults who were previously unvaccinated and who anticipate close contact with an international adoptee during the first 60 days after arrival in the [place] from a country with a high rate of hepatitis A should be immunized.  Hepatitis B vaccine. Adults who wish to be protected from this disease, have certain high-risk conditions, may be exposed to blood or other infectious body fluids, are household contacts or sex partners of hepatitis B positive people, are clients or workers in certain care facilities, or travel to or work in countries with a high rate of hepatitis B should be immunized.  Haemophilus influenzae type b (Hib) vaccine. A previously unvaccinated person with asplenia or sickle cell disease or having a  scheduled splenectomy should receive 1 dose of Hib vaccine. Regardless of previous immunization, a recipient of a hematopoietic stem cell transplant should receive a 3-dose series 6-12 months after her successful transplant. Hib vaccine is not recommended for adults with HIV infection. Preventive Services / Frequency Ages 67 to 10 years  Blood pressure check.** / Every 3-5 years.  Lipid and cholesterol check.** / Every 5 years beginning at age 62.  Clinical breast exam.** / Every 3 years for women in their 58s and 75s.  BRCA-related cancer risk assessment.** / For women who have family members with a BRCA-related cancer (breast, ovarian, tubal, or peritoneal cancers).  Pap test.** / Every 2 years from ages 10 through 73. Every 3 years starting at age 97 through age 80 or 67 with a history of 3 consecutive normal Pap tests.  HPV screening.** / Every 3 years from ages 48 through ages 42 to 45 with a history of 3 consecutive normal Pap tests.  Hepatitis C blood test.** / For any individual with known risks for hepatitis C.  Skin self-exam. / Monthly.  Influenza vaccine. / Every year.  Tetanus, diphtheria, and acellular pertussis (Tdap, Td) vaccine.** / Consult your health care provider. Pregnant women should receive 1 dose of Tdap vaccine during each pregnancy. 1 dose of Td every 10 years.  Varicella vaccine.** / Consult your health care provider. Pregnant females who do not have evidence of immunity should receive the first dose after pregnancy.  HPV vaccine. / 3 doses over 6 months, if 73 and younger. The vaccine is not recommended for use in pregnant females. However, pregnancy testing is not needed before receiving a dose.  Measles, mumps, rubella (MMR) vaccine.** / You need at least 1 dose of MMR if you were born in 1957 or later. You may also need a 2nd dose. For females of childbearing age, rubella immunity should be determined.  If there is no evidence of immunity, females who are not  pregnant should be vaccinated. If there is no evidence of immunity, females who are pregnant should delay immunization until after pregnancy.  Pneumococcal 13-valent conjugate (PCV13) vaccine.** / Consult your health care provider.  Pneumococcal polysaccharide (PPSV23) vaccine.** / 1 to 2 doses if you smoke cigarettes or if you have certain conditions.  Meningococcal vaccine.** / 1 dose if you are age 46 to 66 years and a Market researcher living in a residence hall, or have one of several medical conditions, you need to get vaccinated against meningococcal disease. You may also need additional booster doses.  Hepatitis A vaccine.** / Consult your health care provider.  Hepatitis B vaccine.** / Consult your health care provider.  Haemophilus influenzae type b (Hib) vaccine.** / Consult your health care provider. Ages 89 to 85 years  Blood pressure check.** / Every year.  Lipid and cholesterol check.** / Every 5 years beginning at age 30 years.  Lung cancer screening. / Every year if you are aged 9-80 years and have a 30-pack-year history of smoking and currently smoke or have quit within the past 15 years. Yearly screening is stopped once you have quit smoking for at least 15 years or develop a health problem that would prevent you from having lung cancer treatment.  Clinical breast exam.** / Every year after age 52 years.  BRCA-related cancer risk assessment.** / For women who have family members with a BRCA-related cancer (breast, ovarian, tubal, or peritoneal cancers).  Mammogram.** / Every year beginning at age 33 years and continuing for as long as you are in good health. Consult with your health care provider.  Pap test.** / Every 3 years starting at age 74 years through age 81 or 3 years with a history of 3 consecutive normal Pap tests.  HPV screening.** / Every 3 years from ages 64 years through ages 55 to 12 years with a history of 3 consecutive normal Pap  tests.  Fecal occult blood test (FOBT) of stool. / Every year beginning at age 72 years and continuing until age 38 years. You may not need to do this test if you get a colonoscopy every 10 years.  Flexible sigmoidoscopy or colonoscopy.** / Every 5 years for a flexible sigmoidoscopy or every 10 years for a colonoscopy beginning at age 40 years and continuing until age 77 years.  Hepatitis C blood test.** / For all people born from [birth year] through 1966 and any individual with known risks for hepatitis C.  Skin self-exam. / Monthly.  Influenza vaccine. / Every year.  Tetanus, diphtheria, and acellular pertussis (Tdap/Td) vaccine.** / Consult your health care provider. Pregnant women should receive 1 dose of Tdap vaccine during each pregnancy. 1 dose of Td every 10 years.  Varicella vaccine.** / Consult your health care provider. Pregnant females who do not have evidence of immunity should receive the first dose after pregnancy.  Zoster vaccine.** / 1 dose for adults aged 38 years or older.  Measles, mumps, rubella (MMR) vaccine.** / You need at least 1 dose of MMR if you were born in 1957 or later. You may also need a second dose. For females of childbearing age, rubella immunity should be determined. If there is no evidence of immunity, females who are not pregnant should be vaccinated. If there is no evidence of immunity, females who are pregnant should delay immunization until after pregnancy.  Pneumococcal 13-valent conjugate (PCV13) vaccine.** / Consult your health care  provider.  Pneumococcal polysaccharide (PPSV23) vaccine.** / 1 to 2 doses if you smoke cigarettes or if you have certain conditions.  Meningococcal vaccine.** / Consult your health care provider.  Hepatitis A vaccine.** / Consult your health care provider.  Hepatitis B vaccine.** / Consult your health care provider.  Haemophilus influenzae type b (Hib) vaccine.** / Consult your health care provider. Ages 43 years and  over  Blood pressure check.** / Every year.  Lipid and cholesterol check.** / Every 5 years beginning at age 6 years.  Lung cancer screening. / Every year if you are aged 34-80 years and have a 30-pack-year history of smoking and currently smoke or have quit within the past 15 years. Yearly screening is stopped once you have quit smoking for at least 15 years or develop a health problem that would prevent you from having lung cancer treatment.  Clinical breast exam.** / Every year after age 61 years.  BRCA-related cancer risk assessment.** / For women who have family members with a BRCA-related cancer (breast, ovarian, tubal, or peritoneal cancers).  Mammogram.** / Every year beginning at age 39 years and continuing for as long as you are in good health. Consult with your health care provider.  Pap test.** / Every 3 years starting at age 57 years through age 9 or 20 years with 3 consecutive normal Pap tests. Testing can be stopped between 65 and 70 years with 3 consecutive normal Pap tests and no abnormal Pap or HPV tests in the past 10 years.  HPV screening.** / Every 3 years from ages 58 years through ages 62 or 35 years with a history of 3 consecutive normal Pap tests. Testing can be stopped between 65 and 70 years with 3 consecutive normal Pap tests and no abnormal Pap or HPV tests in the past 10 years.  Fecal occult blood test (FOBT) of stool. / Every year beginning at age 64 years and continuing until age 59 years. You may not need to do this test if you get a colonoscopy every 10 years.  Flexible sigmoidoscopy or colonoscopy.** / Every 5 years for a flexible sigmoidoscopy or every 10 years for a colonoscopy beginning at age 28 years and continuing until age 83 years.  Hepatitis C blood test.** / For all people born from [birth year] through 1966 and any individual with known risks for hepatitis C.  Osteoporosis screening.** / A one-time screening for women ages 87 years and over and women  at risk for fractures or osteoporosis.  Skin self-exam. / Monthly.  Influenza vaccine. / Every year.  Tetanus, diphtheria, and acellular pertussis (Tdap/Td) vaccine.** / 1 dose of Td every 10 years.  Varicella vaccine.** / Consult your health care provider.  Zoster vaccine.** / 1 dose for adults aged 35 years or older.  Pneumococcal 13-valent conjugate (PCV13) vaccine.** / Consult your health care provider.  Pneumococcal polysaccharide (PPSV23) vaccine.** / 1 dose for all adults aged 75 years and older.  Meningococcal vaccine.** / Consult your health care provider.  Hepatitis A vaccine.** / Consult your health care provider.  Hepatitis B vaccine.** / Consult your health care provider.  Haemophilus influenzae type b (Hib) vaccine.** / Consult your health care provider. ** Family history and personal history of risk and conditions may change your health care provider's recommendations.   This information is not intended to replace advice given to you by your health care provider. Make sure you discuss any questions you have with your health care provider.   Document Released: 12/23/2001  Document Revised: 11/17/2014 Document Reviewed: 03/24/2011 Elsevier Interactive Patient Education [employer].

## 2015-10-02 NOTE — Progress Notes (Signed)
Subjective:    Patient ID: Stephanie Barnes, female    DOB: July 06, 1951, 64 y.o.   MRN: [id number]  Chief Complaint  Patient presents with  . Medicare Wellness    HPI Patient is in today for wellness exam and follow up on numerous concerns. Continues to struggle to right arm pain and decreased range of motion. Is scheduled for MRI soon to have the shoulder evaluated then will follow up with her orthopaedist Dr Tonita Cong regarding options. She notes pain is worsening. Is having to eat more processed foods. Is moving less since her shoulder injury also. No other recent illness or acute concerns. Will need to wait for MGM til shoulder able to move. Unable to lift shoulder. Denies CP/palp/SOB/HA/congestion/fevers/GI or GU c/o. Taking meds as prescribed  Past Medical History  Diagnosis Date  . Anxiety   . Arthritis   . Depression   . 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 Rotator cuff tear   . Medicare annual wellness visit, subsequent 10/02/2015  . [facility])     Past Surgical History  Procedure Laterality Date  . Tonsillectomy    . Dental surgery      Root cleaning   . Refractive surgery Bilateral 2000    Family History  Problem Relation Age of Onset  . Heart disease Mother   . Arthritis Mother   . Heart disease Father   . COPD Father   . Mental illness Brother     bipolar  . Hypertension Brother   . Suicidality Brother   . Alcohol abuse Brother   . Heart disease Maternal Aunt   . Heart disease Maternal Uncle   . Heart disease Paternal Aunt   . COPD Paternal 16   . Heart disease Paternal Uncle   . Heart disease Maternal Grandmother   . Heart disease Maternal Grandfather   . Heart disease Paternal Grandmother   . Heart disease Paternal Grandfather     Social History   Social History  . Marital Status: Single    Spouse Name: N/A  . Number of Children: N/A  . Years of Education: N/A   Occupational History  . Not on file.   Social History Main Topics  . Smoking  status: Never Smoker   . Smokeless tobacco: Never Used  . Alcohol Use: 0.6 oz/week    1 Glasses of wine per week     Comment: per month  . Drug Use: No  . Sexual Activity: No     Comment: lives alone, retired from [employer], no dietary restrictions   Other Topics Concern  . Not on file   Social History Narrative    Outpatient Prescriptions Prior to Visit  Medication Sig Dispense Refill  . buPROPion (WELLBUTRIN) 75 MG tablet 1/4 tablet daily 30 tablet 6  . Calcium Carbonate-Vit D-Min (CALCIUM 1200 PO) Take 1 tablet by mouth 2 (two) times daily.    . cetirizine (ZYRTEC) 10 MG tablet Take 10 mg by mouth daily as needed.     . clonazePAM (KLONOPIN) 1 MG tablet TAKE ONE-HALF TO ONE TABLET BY MOUTH TWICE DAILY AS NEEDED FOR ANXIETY 30 tablet 1  . ibuprofen (ADVIL,MOTRIN) 800 MG tablet Take 800 mg by mouth 3 (three) times daily.    . mometasone (ELOCON) 0.1 % ointment Apply topically daily. As needed 15 g 0  . Omega-3 Fatty Acids (FISH OIL) 1000 MG CAPS Take by mouth 3 (three) times daily.    . pantoprazole (PROTONIX) 40 MG tablet Take 1  tablet (40 mg total) by mouth daily. 90 tablet 2  . Probiotic Product (PROBIOTIC DAILY PO) Take by mouth.    . traMADol (ULTRAM) 50 MG tablet Take 1 tablet (50 mg total) by mouth every 8 (eight) hours as needed. 30 tablet 0   No facility-administered medications prior to visit.    Allergies  Allergen Reactions  . Aspirin Swelling  . Celebrex [Celecoxib] Anaphylaxis  . Celexa [Citalopram]   . Clindamycin/Lincomycin   . Doxycycline   . Fosamax [Alendronate Sodium]   . Other     Arthritec  . Prednisone     Review of Systems  Constitutional: Negative for fever, chills and malaise/fatigue.  HENT: Negative for congestion and hearing loss.   Eyes: Negative for discharge.  Respiratory: Negative for cough, sputum production and shortness of breath.   Cardiovascular: Negative for chest pain, palpitations and leg swelling.  Gastrointestinal:  Negative for heartburn, nausea, vomiting, abdominal pain, diarrhea, constipation and blood in stool.  Genitourinary: Negative for dysuria, urgency, frequency and hematuria.  Musculoskeletal: Positive for joint pain. Negative for myalgias, back pain and falls.  Skin: Negative for rash.  Neurological: Negative for dizziness, sensory change, loss of consciousness, weakness and headaches.  Endo/Heme/Allergies: Negative for environmental allergies. Does not bruise/bleed easily.  Psychiatric/Behavioral: Negative for depression and suicidal ideas. The patient is not nervous/anxious and does not have insomnia.        Objective:    Physical Exam  Constitutional: She is oriented to person, place, and time. She appears well-developed and well-nourished. No distress.  HENT:  Head: Normocephalic and atraumatic.  Eyes: Conjunctivae are normal.  Neck: Neck supple. No thyromegaly present.  Cardiovascular: Normal rate, regular rhythm and normal heart sounds.   No murmur heard. Pulmonary/Chest: Effort normal and breath sounds normal. No respiratory distress.  Abdominal: Soft. Bowel sounds are normal. She exhibits no distension and no mass. There is no tenderness.  Musculoskeletal: She exhibits no edema.  Lymphadenopathy:    She has no cervical adenopathy.  Neurological: She is alert and oriented to person, place, and time.  Skin: Skin is warm and dry.  Psychiatric: She has a normal mood and affect. Her behavior is normal.    BP 118/78 mmHg  Pulse 69  Temp(Src) 97.9 F (36.6 C) (Oral)  Ht 4' 11.5" (1.511 m)  Wt 134 lb 6 oz (60.952 kg)  BMI 26.70 kg/m2  SpO2 97% Wt Readings from Last 3 Encounters:  10/02/15 134 lb 6 oz (60.952 kg)  09/13/15 133 lb 6 oz (60.499 kg)  07/23/15 132 lb (59.875 kg)     Lab Results  Component Value Date   WBC 5.3 09/13/2015   HGB 12.9 09/13/2015   HCT 39.1 09/13/2015   PLT 204.0 09/13/2015   GLUCOSE 95 09/13/2015   CHOL 218* 09/13/2015   TRIG 95.0  09/13/2015   HDL 81.30 09/13/2015   LDLCALC 118* 09/13/2015   ALT 12 09/13/2015   AST 12 09/13/2015   NA 140 09/13/2015   K 3.8 09/13/2015   CL 104 09/13/2015   CREATININE 0.88 09/13/2015   BUN 15 09/13/2015   CO2 30 09/13/2015   TSH 2.18 09/13/2015    Lab Results  Component Value Date   TSH 2.18 09/13/2015   Lab Results  Component Value Date   WBC 5.3 09/13/2015   HGB 12.9 09/13/2015   HCT 39.1 09/13/2015   MCV 91.2 09/13/2015   PLT 204.0 09/13/2015   Lab Results  Component Value Date   NA 140  09/13/2015   K 3.8 09/13/2015   CO2 30 09/13/2015   GLUCOSE 95 09/13/2015   BUN 15 09/13/2015   CREATININE 0.88 09/13/2015   BILITOT 0.5 09/13/2015   ALKPHOS 34* 09/13/2015   AST 12 09/13/2015   ALT 12 09/13/2015   PROT 7.0 09/13/2015   ALBUMIN 4.1 09/13/2015   CALCIUM 9.3 09/13/2015   GFR 68.51 09/13/2015   Lab Results  Component Value Date   CHOL 218* 09/13/2015   Lab Results  Component Value Date   HDL 81.30 09/13/2015   Lab Results  Component Value Date   LDLCALC 118* 09/13/2015   Lab Results  Component Value Date   TRIG 95.0 09/13/2015   Lab Results  Component Value Date   CHOLHDL 3 09/13/2015   No results found for: HGBA1C     Assessment & Plan:   Problem List Items Addressed This Visit    Abnormal EKG   GERD (gastroesophageal reflux disease)    Avoid offending foods, start probiotics. Do not eat large meals in late evening and consider raising head of bed.       Relevant Orders   TSH   CBC   Comprehensive metabolic panel   Lipid panel   Hyperlipidemia    Encouraged heart healthy diet, increase exercise, avoid trans fats, consider a krill oil cap daily      Relevant Orders   TSH   CBC   Comprehensive metabolic panel   Lipid panel   Insomnia    Encouraged good sleep hygiene such as dark, quiet room. No blue/green glowing lights such as computer screens in bedroom. No alcohol or stimulants in evening. Cut down on caffeine as able.  Regular exercise is helpful but not just prior to bed time. Using Tramadol prn due to shoulder pain lately      Relevant Orders   TSH   CBC   Comprehensive metabolic panel   Lipid panel   Medicare annual wellness visit, subsequent    Patient denies any difficulties at home. No trouble with ADLs, depression or falls. See EMR for functional status screen and depression screen. No recent changes to vision or hearing. Is UTD with immunizations. Is UTD with screening. Advanced Directives in chart. Encouraged heart healthy diet, exercise as tolerated and adequate sleep. See patient's problem list for health risk factors to monitor. See AVS for preventative healthcare recommendation schedule. Flu shot given today Colonoscopy and UGI in October of 2015, Dr Collene Mares gastroenterology MGM 2015 will wait til after shoulder surgery EKG today, likely will be preop for the surgery, given pneumonia shot   Immunization Status: Flu vaccine-- 07/31/14 Tdap-- Unknown; patient does not recall the last vaccine she received. PNA-- patient reported that she has not had this vaccine yet. Shingles-- 12/02/12  A/P:  Changes to [place], [place] or Personal Hx: UTD Pap-- 06/13/13 w/ Dr. Emi Belfast; normal MMG-- [facility]/24/14 [facility]/ [facility]; bi-rads category 1-negative Bone Density-- 07/05/13 w/ the [place]; AP Lumbar Spine L1-L4 (T-score -1.8) & Left Femur Neck (T-score -1.2); osteopenia CCS-- 03/15/14 w/ Dr. Juanita Craver at [facility]; one diminutive polyp in the mid ascending colon; removed; one polyp (5-6 mm) in the sigmoid colon; resected and retrieved; follow-up 5-10 years.  Care Teams Updated:  Arman Bogus, MS, L.Ac - Accupuncture  Annie Paras, MPT - Physical Therapy  Dr. Donnald Garre - Orthopedic Surgery  Dr. Juanita Craver - Gastroenterology  ED/Hospital/Urgent Care Visits: Per the patient, no recent visits to  the ED/Hospital or Urgent Care.      Relevant  Orders   TSH   CBC   Comprehensive metabolic panel   Lipid panel   Osteopenia    Takes Vitamin D 1000 IU daily will check level today. Encouraged 1500 mg daily divided 3 x a day in diet and supplements, each calcium is roughly 500mg       Relevant Orders   Vitamin D (25 hydroxy) (Completed)   TSH   CBC   Comprehensive metabolic panel   Lipid panel   Pain in joint of right shoulder    Had an incident with a ladder from her attic hitting her shoulder is now following again with Dr Tonita Cong, has MRI scheduled for tomorrow. Continues to struggle with pain      Relevant Orders   TSH   CBC   Comprehensive metabolic panel   Lipid panel    Other Visit Diagnoses    Encounter for immunization    -  Primary    Decreased visual acuity        Relevant Orders    Ambulatory referral to Ophthalmology    TSH    CBC    Comprehensive metabolic panel    Lipid panel    Welcome to Medicare preventive visit        Relevant Orders    EKG 12-Lead (Completed)    TSH    CBC    Comprehensive metabolic panel    Lipid panel    Need for vaccination with 13-polyvalent pneumococcal conjugate vaccine        Relevant Orders    Pneumococcal conjugate vaccine 13-valent (Completed)       I am having Ms. Arbutus Ped maintain her Probiotic Product (PROBIOTIC DAILY PO), Fish Oil, Calcium Carbonate-Vit D-Min (CALCIUM 1200 PO), cetirizine, pantoprazole, mometasone, buPROPion, ibuprofen, traMADol, and clonazePAM.  No orders of the defined types were placed in this encounter.     Penni Homans, MD

## 2015-10-02 NOTE — Assessment & Plan Note (Signed)
Encouraged heart healthy diet, increase exercise, avoid trans fats, consider a krill oil cap daily 

## 2015-10-02 NOTE — Therapy (Addendum)
[facility] [address]  [place], [place], [address] Phone: [phone]   Fax:  [phone]  Physical Therapy Treatment  Patient Details  Name: Stephanie Barnes MRN: [id number] Date of Birth: January 14, 1951 Referring Provider: Susa [place], MD  Encounter Date: 10/02/2015      PT End of Session - 10/02/15 1027    Visit Number 12   Number of Visits 16   Date for PT Re-Evaluation 10/10/15   PT Start Time 1020   PT Stop Time 1058   PT Time Calculation (min) 38 min   Activity Tolerance Patient tolerated treatment well   Behavior During Therapy [facility] for tasks assessed/performed      Past Medical History  Diagnosis Date  . Anxiety   . Arthritis   . Depression   . 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 Rotator cuff tear   . Medicare annual wellness visit, subsequent 10/02/2015  . [facility])     Past Surgical History  Procedure Laterality Date  . Tonsillectomy    . Dental surgery      Root cleaning   . Refractive surgery Bilateral 2000    There were no vitals filed for this visit.  Visit Diagnosis:  Pain of right shoulder region  Stiffness of right shoulder joint  Muscle right arm weakness  Posture abnormality      Subjective Assessment - 10/02/15 1024    Subjective Patient reports pain was bad last (avg 8/10) all week, but saw her accupuncturist yesterday and feels better today. Reports fleeting sharp pains "all over".   Currently in Pain? Yes   Pain Score 3    Pain Location Shoulder   Pain Orientation Right;Upper;Anterior                 [facility] Adult PT Treatment/Exercise - 10/02/15 1020    Exercises   Exercises Shoulder   Shoulder Exercises: Seated   Retraction Both;10 reps   Retraction Limitations 5" hold   Other Seated Exercises Shoulder rolls backwards x10   Shoulder Exercises: Standing   Row Strengthening;Both;10 reps;Theraband  2 sets   Theraband Level (Shoulder Row) Level 2  (Red)   Other Standing Exercises Rt shoulder depression + retraction with yellow TB 10x5"   Other Standing Exercises Pendulum exercises - fwd/back, side/side, CW/CCW 2x60" each   Shoulder Exercises: Stretch   Other Shoulder Stretches Right upper trap stretch 3x20" (seated)   Manual Therapy   Manual Therapy Joint mobilization;Soft tissue mobilization;Scapular mobilization   Joint Mobilization Grade I-II right shoulder distraction, inf and A/P glides to patient tolerance starting with neutral shoulder and gradually progressing into increasing abduction per patient tolerance stopping at ~60-70 dg   Soft tissue mobilization Gentle STM to right triceps & posterior deltoid                  PT Short Term Goals - 08/22/15 1052    PT SHORT TERM GOAL #1   Title Indpendent with initial HEP (09/05/15)   Status Achieved           PT Long Term Goals - 10/02/15 1252    PT LONG TERM GOAL #1   Title Independent with advanced HEP (10/10/15)   Status On-going   PT LONG TERM GOAL #2   Title Patient will demonstrate right shoulder ROM within 15 degrees of left without increased pain for improved functional reach (10/10/15)   Status On-going   PT LONG TERM GOAL #3   Title  Right shoulder strength 4/5 or greater for improved shoulder stability during functional tasks (10/10/15)   Status On-going   PT LONG TERM GOAL #4   Title Patient will report pain at worst no greater than 5/10 in right shoulder (10/10/15)   Status On-going   PT LONG TERM GOAL #5   Title Patient will report functional use of right UE during self care ADLs and daily activities (ie. cooking) without increased pain (10/10/15)   Status On-going               Plan - 10/02/15 1245    Clinical Impression Statement Pain remained very intense most of last week with average pain intensity 8/10 per patient report, but somewhat better this week especially after accupunture treatment yesterday. Reviewed HEP today and  corrected technique as appropriate but did not attempt to add to HEP due to continued increased pain and high sensitivity. Focused on very gentle joint mobs, STM and PROM within patient's tolerance to decrease risk for development of frozen shoulder. Patient to have MRI tomorrow with results reviewed with MD on 10/09/15. Pending outcome of MRI and MD's plan, will plan for recert vs D/C at end of current POC date range on 10/10/15 (next scheduled PT visi).   PT Next Visit Plan Recert vs D/C pending results of MRI and MD plan   Consulted and Agree with Plan of Care Patient        Problem List Patient Active Problem List   Diagnosis Date Noted  . Medicare annual wellness visit, subsequent 10/02/2015  . Cellulitis of face 09/23/2015  . Pain in joint of right shoulder 09/23/2015  . Cervical disc disorder with radiculopathy of cervical region 07/17/2015  . Colon polyp   tubular adenoma  03/2014 03/22/2014  . Hyperlipidemia 06/13/2013  . Abnormal EKG 06/13/2013  . Osteopenia 06/13/2013  . Insomnia 06/09/2013  . 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 DJD (degenerative joint disease) 12/02/2012  . GERD (gastroesophageal reflux disease) 12/02/2012  . Eczema 12/02/2012  . Tendinitis of hand 12/02/2012    Percival Spanish, PT, MPT 10/02/2015, 12:57 PM  [facility] [address]  [place], [place], [address] Phone: [phone]   Fax:  [phone]  Name: Stephanie Barnes MRN: [id number] Date of Birth: Jan 21, 1951   PHYSICAL THERAPY DISCHARGE SUMMARY  Visits from Start of Care: 12  Current functional level related to goals / functional outcomes:   Patient saw MD on 10/09/15 and was told she was going to need to have surgery for RTC tear of 3 tendons and hence will D/C from PT at this time and await new orders after surgery.   Remaining deficits:   Pain, severely limited ROM since trauma to shoulder a few weeks ago   Education /  Equipment:  HEP Plan: Patient agrees to discharge.  Patient goals were not met. Patient is being discharged due to a change in medical status.  ?????       Percival Spanish, PT, MPT 10/15/2015, 2:46 PM  [facility] [address]  [place], [place], [address] Phone: [phone]   Fax:  [phone]

## 2015-10-02 NOTE — Progress Notes (Signed)
Pre visit review using our clinic review tool, if applicable. No additional management support is needed unless otherwise documented below in the visit note. 

## 2015-10-02 NOTE — Assessment & Plan Note (Signed)
Takes Vitamin D 1000 IU daily will check level today. Encouraged 1500 mg daily divided 3 x a day in diet and supplements, each calcium is roughly 500mg 

## 2015-10-02 NOTE — Assessment & Plan Note (Signed)
Avoid offending foods, start probiotics. Do not eat large meals in late evening and consider raising head of bed.  

## 2015-10-03 DIAGNOSIS — G8929 Other chronic pain: Secondary | ICD-10-CM | POA: Diagnosis not present

## 2015-10-03 DIAGNOSIS — M25511 Pain in right shoulder: Secondary | ICD-10-CM | POA: Diagnosis not present

## 2015-10-07 ENCOUNTER — Encounter: Payer: Self-pay | Admitting: Family Medicine

## 2015-10-09 DIAGNOSIS — M25511 Pain in right shoulder: Secondary | ICD-10-CM | POA: Diagnosis not present

## 2015-10-09 DIAGNOSIS — S46011D Strain of muscle(s) and tendon(s) of the rotator cuff of right shoulder, subsequent encounter: Secondary | ICD-10-CM | POA: Diagnosis not present

## 2015-10-09 DIAGNOSIS — M7501 Adhesive capsulitis of right shoulder: Secondary | ICD-10-CM | POA: Diagnosis not present

## 2015-10-09 DIAGNOSIS — M7541 Impingement syndrome of right shoulder: Secondary | ICD-10-CM | POA: Diagnosis not present

## 2015-10-10 ENCOUNTER — Ambulatory Visit: Payer: Medicare Other | Admitting: Physical Therapy

## 2015-10-11 ENCOUNTER — Encounter: Payer: Self-pay | Admitting: Physical Therapy

## 2015-10-12 ENCOUNTER — Telehealth: Payer: Self-pay | Admitting: Family Medicine

## 2015-10-12 NOTE — Telephone Encounter (Signed)
Faxed Surgical Clearance note to [place] at [phone] and did receive confirmation fax received.

## 2015-10-15 ENCOUNTER — Encounter: Payer: Self-pay | Admitting: Physical Therapy

## 2015-10-15 ENCOUNTER — Encounter: Payer: Self-pay | Admitting: Family Medicine

## 2015-10-15 ENCOUNTER — Other Ambulatory Visit: Payer: Self-pay | Admitting: Family Medicine

## 2015-10-15 DIAGNOSIS — M5010 Cervical disc disorder with radiculopathy, unspecified cervical region: Secondary | ICD-10-CM

## 2015-10-15 MED ORDER — TRAMADOL HCL 50 MG PO TABS: 50.0000 mg | ORAL_TABLET | Freq: Three times a day (TID) | ORAL | Status: DC | PRN

## 2015-10-22 DIAGNOSIS — S46011A Strain of muscle(s) and tendon(s) of the rotator cuff of right shoulder, initial encounter: Secondary | ICD-10-CM | POA: Diagnosis not present

## 2015-10-22 DIAGNOSIS — M7541 Impingement syndrome of right shoulder: Secondary | ICD-10-CM | POA: Diagnosis not present

## 2015-10-22 DIAGNOSIS — G8918 Other acute postprocedural pain: Secondary | ICD-10-CM | POA: Diagnosis not present

## 2015-10-22 DIAGNOSIS — M19011 Primary osteoarthritis, right shoulder: Secondary | ICD-10-CM | POA: Diagnosis not present

## 2015-10-22 DIAGNOSIS — M7501 Adhesive capsulitis of right shoulder: Secondary | ICD-10-CM | POA: Diagnosis not present

## 2015-10-22 DIAGNOSIS — S46011D Strain of muscle(s) and tendon(s) of the rotator cuff of right shoulder, subsequent encounter: Secondary | ICD-10-CM | POA: Diagnosis not present

## 2015-11-06 DIAGNOSIS — S46011D Strain of muscle(s) and tendon(s) of the rotator cuff of right shoulder, subsequent encounter: Secondary | ICD-10-CM | POA: Diagnosis not present

## 2015-11-07 ENCOUNTER — Encounter: Payer: Self-pay | Admitting: Physical Therapy

## 2015-11-11 HISTORY — PX: ROTATOR CUFF REPAIR: SHX139

## 2015-11-20 ENCOUNTER — Ambulatory Visit: Payer: Medicare HMO | Attending: Specialist | Admitting: Physical Therapy

## 2015-11-20 DIAGNOSIS — R293 Abnormal posture: Secondary | ICD-10-CM | POA: Diagnosis not present

## 2015-11-20 DIAGNOSIS — R609 Edema, unspecified: Secondary | ICD-10-CM | POA: Insufficient documentation

## 2015-11-20 DIAGNOSIS — M6281 Muscle weakness (generalized): Secondary | ICD-10-CM

## 2015-11-20 DIAGNOSIS — M25511 Pain in right shoulder: Secondary | ICD-10-CM | POA: Diagnosis not present

## 2015-11-20 DIAGNOSIS — M25611 Stiffness of right shoulder, not elsewhere classified: Secondary | ICD-10-CM | POA: Diagnosis not present

## 2015-11-20 NOTE — Therapy (Signed)
[facility] [address]  [place], [place], [address] Phone: [phone]   Fax:  [phone]  Physical Therapy Evaluation  Patient Details  Name: Stephanie Barnes MRN: [id number] Date of Birth: [birth year] Referring Provider: Dondra Spry, MD  Encounter Date: 11/20/2015      PT End of Session - 11/20/15 1534    Visit Number 1   Number of Visits 30   Date for PT Re-Evaluation 02/12/16   PT Start Time [id number]   PT Stop Time 1405   PT Time Calculation (min) 48 min   Activity Tolerance Patient limited by pain   Behavior During Therapy Anxious      Past Medical History  Diagnosis Date  . Anxiety   . Arthritis   . Depression   . 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 Rotator cuff tear   . Medicare annual wellness visit, subsequent 10/02/2015  . Cancer ([place])   . Welcome to Medicare preventive visit 10/02/2015    Past Surgical History  Procedure Laterality Date  . Tonsillectomy    . Dental surgery      Root cleaning   . Refractive surgery Bilateral 2000    There were no vitals filed for this visit.  Visit Diagnosis:  Pain of right shoulder region  Stiffness of right shoulder joint  Muscle right arm weakness  Posture abnormality      Subjective Assessment - 11/20/15 1325    Subjective Patient previously seen by PT starting in 08/2014 for right shoulder pain which began back in July. Patient initially slow to improve but had started making good progress until she suffered new trauma to her right shoulder when a folding attic stair set fell on her while she was trying to close the trap door on 09/10/15. Increased pain after this event significantly limited patient's therapy tolerance and patient returned to MD with new MRI completed which revealed tear of 3 RTC tendons per patient for which she would require surgical RTC repair, therefore PT discontinued at that time. Underwent open right RTC repair,  subacromial decompression, distal clavicle resection and biceps tendon repair on 10/22/15. Patient returns to PT 4 weeks post-op and remains in shoulder abduction immobilizer 24 hours per day. Reports significant swelling and pain in biceps region and distal right UE, especially in hand and fingers, with minimal AROM of hand and no functional grip. Sleeping in recliner.   Pertinent History 10/22/15 - Right RTC repair, subacromial decompression, distal clavicle resection and biceps tendon repair   Limitations House hold activities   Currently in Pain? Yes   Pain Score 6   Least 2/10, Avg 5-6/10, Worst 10/10   Pain Location Shoulder   Pain Orientation Right;Anterior   Pain Descriptors / Indicators Sharp;Throbbing   Pain Type Surgical pain   Pain Radiating Towards Into pectoralis region and pressure in biceps region   Pain Onset 1 to 4 weeks ago   Pain Frequency Constant   Aggravating Factors  Dependent positioning of arm when out of sling for bathing   Pain Relieving Factors Ice, pain meds   Effect of Pain on Daily Activities Unable to use right UE            [facility] PT Assessment - 11/20/15 1317    Assessment   Medical Diagnosis Right RTC repair with subacromial decompression, distal clavicle resection and biceps tendon repair   Referring Provider Dondra Spry, MD   Onset Date/Surgical Date 10/22/15   Next  MD Visit 6 weeks   Balance Screen   Has the patient fallen in the past 6 months No   Has the patient had a decrease in activity level because of a fear of falling?  No   Is the patient reluctant to leave their home because of a fear of falling?  No   Home Environment   Living Environment Private residence   Living Arrangements Alone   Prior Function   Level of Independence Independent   Vocation Part time employment  not currently working   [employer] work   Leisure Cooking, golf   Observation/Other Assessments   Focus on Therapeutic Outcomes (FOTO)   Shoulder - 0% (100% limitation); Predicted 52% (48% limitation)   ROM / Strength   AROM / PROM / Strength PROM   PROM   PROM Assessment Site Shoulder   Right/Left Shoulder Right   Right Shoulder Flexion 26 Degrees   Right Shoulder ABduction 38 Degrees   Palpation   Palpation comment ttp over anterior shoulder, pectoralis, proximal biceps, and thenar aspect of hand with extensive edema in distal upper arm, forearm and hand         Today's Treatment  Manual R Shoulder Grade I-II AP and Caudal glides with mobes/stretch into ER, Flex, and ABD to tolerance or per protocol Gentle retrograde massage to hand, wrist, forearm and upper arm to facilitate edema reduction to patient tolerance  TherEx  HEP instruct and perform: hand exercises including gross flexion/extension, finger opposition; wrist flex/ext, rad/ulnar deviation; forearm pronation/supination           PT Education - 11/20/15 1856    Education provided Yes   Education Details Review of pendulum exercises; Instruction in hand, wrist and forearm exercises for edema management   Person(s) Educated Patient   Methods Explanation;Demonstration   Comprehension Verbalized understanding;Returned demonstration          PT Short Term Goals - 08/22/15 1052    PT SHORT TERM GOAL #1   Title Indpendent with initial HEP (09/05/15)   Status Achieved           PT Long Term Goals - 11/20/15 1838    PT LONG TERM GOAL #1   Title R Shoulder AROM WFL all planes without limit by pain by 02/12/16   Time 12   Period Weeks   Status New   PT LONG TERM GOAL #2   Title R Shoulder MMT 4/5 or better all planes by 02/12/16   Time 12   Period Weeks   Status New   PT LONG TERM GOAL #3   Title Pt able to return to all ADLs, chores, and daily activities without limitation by R shoulder pain, LOM, or weakness by 02/12/16   Time 12   Period Weeks   Status New   PT LONG TERM GOAL #4   Title .   PT LONG TERM GOAL #5   Title .                Plan - 11/20/15 1550    Clinical Impression Statement Patient is a 65 y/o female who presents to OP PT 4 weeks s/p open right RTC repair, subacromial decompression, distal clavicle resection and biceps tendon repair on 10/22/15. Upon assessment, patient noted to have extensive edema in right UE from mid bicep region extending through forearm and into hand with very limited AROM of hand and fingers due to edema (call placed to Dr. Tonita Cong and message left with concerns  related to edema). Shoulder PROM assessment limited due to patient anxiety/fear of movement along with tenderness when arm gripped near/at hand. Initiated gentle [place] mobilization to reduce pain and promote increased tolerance for PROM with limited patient tolerance. Performed gentle retrograde massage for edema management and instructed patient in AROM of hand to promote edema reduction. PT POC to follow RTC repair protocol as tolerated.   Pt will benefit from skilled therapeutic intervention in order to improve on the following deficits Pain;Decreased range of motion;Impaired flexibility;Decreased strength;Postural dysfunction;Impaired perceived functional ability;Increased edema;Impaired UE functional use;Decreased activity tolerance   Rehab Potential Good   Clinical Impairments Affecting Rehab Potential Limited pain control, Extensive edema in right UE, Anxiety - Patient apprehension/fear of movement with guarding/self-limiting behavior   PT Frequency 3x / week  reducing to 2x/wk pending progress   PT Duration 12 weeks   PT Treatment/Interventions Manual techniques;Passive range of motion;Therapeutic exercise;Therapeutic activities;Cryotherapy;Vasopneumatic Device;Electrical Stimulation;Iontophoresis 4mg /ml Dexamethasone;Neuromuscular re-education;Patient/family education   PT Next Visit Plan Edema management, Gentle GH & scapulothoracic mobs and PROM as tolerated   Consulted and Agree with Plan of Care Patient           G-Codes - 11/22/2015 1900    Functional Assessment Tool Used Shoulder FOTO = 0% (100% limitation)   Functional Limitation Carrying, moving and handling objects   Carrying, Moving and Handling Objects Current Status [id number]) 100 percent impaired, limited or restricted   Carrying, Moving and Handling Objects Goal Status [id number]) At least 40 percent but less than 60 percent impaired, limited or restricted       Problem List Patient Active Problem List   Diagnosis Date Noted  . Welcome to Medicare preventive visit 10/02/2015  . Cellulitis of face 09/23/2015  . Pain in joint of right shoulder 09/23/2015  . Cervical disc disorder with radiculopathy of cervical region 07/17/2015  . Colon polyp   tubular adenoma  03/2014 03/22/2014  . Hyperlipidemia 06/13/2013  . Abnormal EKG 06/13/2013  . Osteopenia 06/13/2013  . Insomnia 06/09/2013  . 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 DJD (degenerative joint disease) 12/02/2012  . GERD (gastroesophageal reflux disease) 12/02/2012  . Eczema 12/02/2012  . Tendinitis of hand 12/02/2012    Percival Spanish, PT, MPT 2015-11-22, 7:01 PM  [facility] [address]  [place], [place], [address] Phone: [phone]   Fax:  [phone]  Name: Stephanie Barnes MRN: [id number] Date of Birth: 1951-04-15

## 2015-11-21 ENCOUNTER — Ambulatory Visit (HOSPITAL_COMMUNITY)
Admission: RE | Admit: 2015-11-21 | Discharge: 2015-11-21 | Disposition: A | Discharge status: Home / Self Care | Payer: Medicare HMO | Source: Ambulatory Visit | Attending: Internal Medicine | Admitting: Internal Medicine

## 2015-11-21 ENCOUNTER — Encounter: Payer: Self-pay | Admitting: Physical Therapy

## 2015-11-21 ENCOUNTER — Other Ambulatory Visit (HOSPITAL_COMMUNITY): Payer: Self-pay | Admitting: Specialist

## 2015-11-21 DIAGNOSIS — M7989 Other specified soft tissue disorders: Secondary | ICD-10-CM | POA: Diagnosis not present

## 2015-11-21 DIAGNOSIS — M79601 Pain in right arm: Secondary | ICD-10-CM | POA: Diagnosis not present

## 2015-11-22 ENCOUNTER — Encounter: Payer: Self-pay | Admitting: Family Medicine

## 2015-11-23 ENCOUNTER — Other Ambulatory Visit: Payer: Self-pay | Admitting: Family Medicine

## 2015-11-23 MED ORDER — CLONAZEPAM 1 MG PO TABS: ORAL_TABLET | ORAL | Status: DC

## 2015-11-23 NOTE — Telephone Encounter (Signed)
Printed clonazepam per mychart request/PCP instructions Faxed hardcopy to clonazepam to [facility]

## 2015-11-26 ENCOUNTER — Ambulatory Visit: Payer: Medicare HMO | Admitting: Physical Therapy

## 2015-11-26 DIAGNOSIS — R293 Abnormal posture: Secondary | ICD-10-CM

## 2015-11-26 DIAGNOSIS — M6281 Muscle weakness (generalized): Secondary | ICD-10-CM

## 2015-11-26 DIAGNOSIS — M25511 Pain in right shoulder: Secondary | ICD-10-CM

## 2015-11-26 DIAGNOSIS — M25611 Stiffness of right shoulder, not elsewhere classified: Secondary | ICD-10-CM

## 2015-11-26 NOTE — Therapy (Signed)
[facility] [address]  [place], [place], [address] Phone: [phone]   Fax:  [phone]  Physical Therapy Treatment  Patient Details  Name: Stephanie Barnes MRN: [id number] Date of Birth: 05-27-1951 Referring Provider: Dondra Spry, MD  Encounter Date: 11/26/2015      PT End of Session - 11/26/15 1402    Visit Number 2   Number of Visits 30   Date for PT Re-Evaluation 02/12/16   PT Start Time 1300   PT Stop Time 1342   PT Time Calculation (min) 42 min   Activity Tolerance Patient limited by pain   Behavior During Therapy Anxious      Past Medical History  Diagnosis Date  . Anxiety   . Arthritis   . Depression   . 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 Rotator cuff tear   . Medicare annual wellness visit, subsequent 10/02/2015  . Cancer ([place])   . Welcome to Medicare preventive visit 10/02/2015    Past Surgical History  Procedure Laterality Date  . Tonsillectomy    . Dental surgery      Root cleaning   . Refractive surgery Bilateral 2000    There were no vitals filed for this visit.  Visit Diagnosis:  Pain of right shoulder region  Muscle right arm weakness  Stiffness of right shoulder joint  Posture abnormality      Subjective Assessment - 11/26/15 1303    Subjective Had ultrasound after last session to rule out blood clot; U/S negative.  Still having a lot of swelling.     Currently in Pain? Yes   Pain Score 3    Pain Onset 1 to 4 weeks ago   Pain Frequency Constant   Aggravating Factors  dependent positioning when out os sling for bathing   Pain Relieving Factors ice, pain meds                         [facility] Adult PT Treatment/Exercise - 11/26/15 1348    Elbow Exercises   Elbow Flexion Right;10 reps;Supine;Self ROM   Elbow Extension 10 reps;Right;Supine;Self ROM   Forearm Supination Right;Self ROM;10 reps;Supine   Forearm Pronation Self ROM;Right;10  reps;Supine   Wrist Flexion Self ROM;Right;10 reps;Supine   Wrist Extension Self ROM;Right;10 reps;Supine   Shoulder Exercises: Supine   Flexion AAROM;10 reps   Flexion Limitations LUE support provided at wrist due to inability to grip   Other Supine Exercises Flexion/Extension/IR/ER isometrics 5 sec hold x 10 reps except flexion 5 reps only   Other Supine Exercises cane AA ER/IR with PT assisted hold due to inablilty to grip   Hand Exercises   Other Hand Exercises ball squeeze 2x10   Modalities   Modalities --  pt declined   Manual Therapy   Manual Therapy Passive ROM;Edema management   Edema Management Retrograde Massage RUE from fingers to shoulder   Passive ROM R shoulder flexion/abduction; IR/ER in scapular plane (limited tolerance to PROM and guarding with all motions)                PT Education - 11/26/15 1401    Education provided Yes   Education Details Reinforced elbow/wrist/hand/forearm AROM exercises and edema management   Person(s) Educated Patient   Methods Explanation;Demonstration   Comprehension Verbalized understanding;Returned demonstration;Need further instruction          PT Short Term Goals - 08/22/15 1052    PT SHORT TERM  GOAL #1   Title Indpendent with initial HEP (09/05/15)   Status Achieved           PT Long Term Goals - 11/26/15 1405    PT LONG TERM GOAL #1   Title R Shoulder AROM WFL all planes without limit by pain by 02/12/16   Status On-going   PT LONG TERM GOAL #2   Title R Shoulder MMT 4/5 or better all planes by 02/12/16   Status On-going   PT LONG TERM GOAL #3   Title Pt able to return to all ADLs, chores, and daily activities without limitation by R shoulder pain, LOM, or weakness by 02/12/16   Status On-going               Plan - 11/26/15 1404    Clinical Impression Statement Pt very guarded and anxious with PROM exercises.  Pt excited to feel like she is beginning to "use her arm" with active assistive exercises,  but pain and limited tolerance at this time to exercises.  Will continue to benefit from PT to maximize function.   PT Next Visit Plan Edema management, Gentle GH & scapulothoracic mobs and PROM as tolerated        Problem List Patient Active Problem List   Diagnosis Date Noted  . Welcome to Medicare preventive visit 10/02/2015  . Cellulitis of face 09/23/2015  . Pain in joint of right shoulder 09/23/2015  . Cervical disc disorder with radiculopathy of cervical region 07/17/2015  . Colon polyp   tubular adenoma  03/2014 03/22/2014  . Hyperlipidemia 06/13/2013  . Abnormal EKG 06/13/2013  . Osteopenia 06/13/2013  . Insomnia 06/09/2013  . 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 DJD (degenerative joint disease) 12/02/2012  . GERD (gastroesophageal reflux disease) 12/02/2012  . Eczema 12/02/2012  . Tendinitis of hand 12/02/2012   Laureen Abrahams, PT, DPT 11/26/2015 2:10 PM  [facility] [address]  [place], [place], [address] Phone: [phone]   Fax:  [phone]  Name: Stephanie Barnes MRN: [id number] Date of Birth: [birth year]

## 2015-11-28 ENCOUNTER — Ambulatory Visit: Payer: Medicare HMO | Admitting: Physical Therapy

## 2015-11-28 DIAGNOSIS — M25511 Pain in right shoulder: Secondary | ICD-10-CM

## 2015-11-28 DIAGNOSIS — M25611 Stiffness of right shoulder, not elsewhere classified: Secondary | ICD-10-CM

## 2015-11-28 DIAGNOSIS — M6281 Muscle weakness (generalized): Secondary | ICD-10-CM

## 2015-11-28 DIAGNOSIS — R293 Abnormal posture: Secondary | ICD-10-CM

## 2015-11-28 NOTE — Therapy (Signed)
[facility] [address]  [place], [place], [address] Phone: [phone]   Fax:  [phone]  Physical Therapy Treatment  Patient Details  Name: Stephanie Barnes MRN: [id number] Date of Birth: 20-Feb-1951 Referring Provider: Dondra Spry, MD  Encounter Date: 11/28/2015      PT End of Session - 11/28/15 1453    Visit Number 3   Number of Visits 30   Date for PT Re-Evaluation 02/12/16   PT Start Time [id number]   PT Stop Time 1541   PT Time Calculation (min) 53 min   Activity Tolerance Patient limited by pain   Behavior During Therapy Anxious      Past Medical History  Diagnosis Date  . Anxiety   . Arthritis   . Depression   . 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 Rotator cuff tear   . Medicare annual wellness visit, subsequent 10/02/2015  . Cancer ([place])   . Welcome to Medicare preventive visit 10/02/2015    Past Surgical History  Procedure Laterality Date  . Tonsillectomy    . Dental surgery      Root cleaning   . Refractive surgery Bilateral 2000    There were no vitals filed for this visit.  Visit Diagnosis:  Pain of right shoulder region  Muscle right arm weakness  Stiffness of right shoulder joint  Posture abnormality      Subjective Assessment - 11/28/15 1452    Subjective Patient was excited about movement with last visit but very sore yesterday and still sore today.   Currently in Pain? Yes   Pain Score 6    Pain Location Shoulder   Pain Orientation Right            [facility] PT Assessment - 11/28/15 1448    Assessment   Next MD Visit 12/18/15          Today's Treatment  Manual Gentle retrograde massage to R UE from fingers through hand, wrist, forearm and upper arm to shoulder to facilitate edema reduction to patient tolerance R Shoulder Grade I-II AP and Caudal glides with mobs/stretch into ER, Flex, and ABD to tolerance or per protocol R Shoulder PROM flexion/abduction,  ER/IR in scapular plane to patient tolerance  TherEx  R Finger opposition R Wrist flexion & extension x10 R Forearm pronation/supination x10 R Elbow flexion/extenstion x10 R PT assisted AAROM right shoulder flexion and ER/IR (cane/wand deferred due to pain and limited grip in R hand)  Modalities Vasopnuematic compression to R shoulder - low pressure, lowest temp x15'           PT Short Term Goals - 08/22/15 1052    PT SHORT TERM GOAL #1   Title Indpendent with initial HEP (09/05/15)   Status Achieved           PT Long Term Goals - 11/26/15 1405    PT LONG TERM GOAL #1   Title R Shoulder AROM WFL all planes without limit by pain by 02/12/16   Status On-going   PT LONG TERM GOAL #2   Title R Shoulder MMT 4/5 or better all planes by 02/12/16   Status On-going   PT LONG TERM GOAL #3   Title Pt able to return to all ADLs, chores, and daily activities without limitation by R shoulder pain, LOM, or weakness by 02/12/16   Status On-going               Plan - 11/28/15 1525  Clinical Impression Statement Continued extensive swelling in right arm limiting therapist's hand placement during PROM and patient's ability to grip cane for AAROM wand exercises. More painful today with increased guarding resulting in poor tolerance for exercises.   PT Next Visit Plan Edema management, Gentle GH & scapulothoracic mobs and PROM as tolerated   Consulted and Agree with Plan of Care Patient        Problem List Patient Active Problem List   Diagnosis Date Noted  . Welcome to Medicare preventive visit 10/02/2015  . Cellulitis of face 09/23/2015  . Pain in joint of right shoulder 09/23/2015  . Cervical disc disorder with radiculopathy of cervical region 07/17/2015  . Colon polyp   tubular adenoma  03/2014 03/22/2014  . Hyperlipidemia 06/13/2013  . Abnormal EKG 06/13/2013  . Osteopenia 06/13/2013  . Insomnia 06/09/2013  . 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 DJD (degenerative  joint disease) 12/02/2012  . GERD (gastroesophageal reflux disease) 12/02/2012  . Eczema 12/02/2012  . Tendinitis of hand 12/02/2012    Percival Spanish, PT, MPT 11/28/2015, 5:56 PM  [facility] [address]  [place], [place], [address] Phone: [phone]   Fax:  [phone]  Name: Stephanie Barnes MRN: [id number] Date of Birth: 1951-08-17

## 2015-11-30 ENCOUNTER — Ambulatory Visit: Payer: Medicare HMO | Admitting: Physical Therapy

## 2015-11-30 DIAGNOSIS — M25511 Pain in right shoulder: Secondary | ICD-10-CM

## 2015-11-30 DIAGNOSIS — M6281 Muscle weakness (generalized): Secondary | ICD-10-CM

## 2015-11-30 DIAGNOSIS — R293 Abnormal posture: Secondary | ICD-10-CM

## 2015-11-30 DIAGNOSIS — M25611 Stiffness of right shoulder, not elsewhere classified: Secondary | ICD-10-CM

## 2015-11-30 NOTE — Therapy (Signed)
[facility] [address]  [place], [place], [address] Phone: [phone]   Fax:  [phone]  Physical Therapy Treatment  Patient Details  Name: Stephanie Barnes MRN: [id number] Date of Birth: Nov 27, 1950 Referring Provider: Dondra Spry, MD  Encounter Date: 11/30/2015      PT End of Session - 11/30/15 1109    Visit Number 4   Number of Visits 30   Date for PT Re-Evaluation 02/12/16   PT Start Time 1101   PT Stop Time 1145   PT Time Calculation (min) 44 min   Activity Tolerance Patient limited by pain   Behavior During Therapy Anxious      Past Medical History  Diagnosis Date  . Anxiety   . Arthritis   . Depression   . 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 Rotator cuff tear   . Medicare annual wellness visit, subsequent 10/02/2015  . Cancer ([place])   . Welcome to Medicare preventive visit 10/02/2015    Past Surgical History  Procedure Laterality Date  . Tonsillectomy    . Dental surgery      Root cleaning   . Refractive surgery Bilateral 2000    There were no vitals filed for this visit.  Visit Diagnosis:  Pain of right shoulder region  Muscle right arm weakness  Stiffness of right shoulder joint  Posture abnormality      Subjective Assessment - 11/30/15 1105    Subjective Patient reporting som increased soreness again yesterday, but better today. Reports increasing use of ice and leaning back further in recliner to elevate arm more in effort to reduce swelling.   Currently in Pain? Yes   Pain Score --  3-4/10   Pain Location Shoulder   Pain Orientation Right           Today's Treatment  Manual Gentle retrograde massage to R UE from fingers through hand, wrist, forearm and upper arm to shoulder to facilitate edema reduction to patient tolerance R Shoulder Grade I-II AP and Caudal glides with mobs/stretch into ER, Flex, and ABD to tolerance or per protocol R Shoulder PROM  flexion/abduction, ER/IR in scapular plane to patient tolerance  TherEx  R Finger opposition R Wrist flexion & extension AROM x10 R Forearm pronation/supination AROM x10 with manual assist for full motion at end range R Elbow flexion/extension AROM x10 R PT assisted AAROM right shoulder flexion and ER/IR (cane/wand deferred due to pain and limited grip in R hand) 2 finger Extension/IR/ER isometrics 10x5"            PT Short Term Goals - 08/22/15 1052    PT SHORT TERM GOAL #1   Title Indpendent with initial HEP (09/05/15)   Status Achieved           PT Long Term Goals - 11/30/15 1240    PT LONG TERM GOAL #1   Title R Shoulder AROM WFL all planes without limit by pain by 02/12/16   Status On-going   PT LONG TERM GOAL #2   Title R Shoulder MMT 4/5 or better all planes by 02/12/16   Status On-going   PT LONG TERM GOAL #3   Title Pt able to return to all ADLs, chores, and daily activities without limitation by R shoulder pain, LOM, or weakness by 02/12/16   Status On-going               Plan - 11/30/15 1207    Clinical Impression Statement  PROM tolerance remains limited due to guarding and limited tolerance for PT grip on arm/hand due to edema. Patient noting some relief with mobs and no discomfort with IR/ER isometrics today.   PT Next Visit Plan Edema management, Gentle GH & scapulothoracic mobs and PROM as tolerated   Consulted and Agree with Plan of Care Patient        Problem List Patient Active Problem List   Diagnosis Date Noted  . Welcome to Medicare preventive visit 10/02/2015  . Cellulitis of face 09/23/2015  . Pain in joint of right shoulder 09/23/2015  . Cervical disc disorder with radiculopathy of cervical region 07/17/2015  . Colon polyp   tubular adenoma  03/2014 03/22/2014  . Hyperlipidemia 06/13/2013  . Abnormal EKG 06/13/2013  . Osteopenia 06/13/2013  . Insomnia 06/09/2013  . 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 DJD (degenerative joint  disease) 12/02/2012  . GERD (gastroesophageal reflux disease) 12/02/2012  . Eczema 12/02/2012  . Tendinitis of hand 12/02/2012    Percival Spanish, PT, MPT 11/30/2015, 12:42 PM  [facility] [address]  [place], [place], [address] Phone: [phone]   Fax:  [phone]  Name: Stephanie Barnes MRN: [id number] Date of Birth: 19-Apr-1951

## 2015-12-03 ENCOUNTER — Ambulatory Visit: Payer: Medicare HMO | Admitting: Physical Therapy

## 2015-12-03 DIAGNOSIS — M25611 Stiffness of right shoulder, not elsewhere classified: Secondary | ICD-10-CM

## 2015-12-03 DIAGNOSIS — R293 Abnormal posture: Secondary | ICD-10-CM

## 2015-12-03 DIAGNOSIS — M25511 Pain in right shoulder: Secondary | ICD-10-CM

## 2015-12-03 DIAGNOSIS — M6281 Muscle weakness (generalized): Secondary | ICD-10-CM

## 2015-12-03 NOTE — Therapy (Signed)
[facility] [address]  [place], [place], [address] Phone: [phone]   Fax:  [phone]  Physical Therapy Treatment  Patient Details  Name: Stephanie Barnes MRN: [id number] Date of Birth: 06-30-1951 Referring Provider: Dondra Spry, MD  Encounter Date: 12/03/2015      PT End of Session - 12/03/15 1109    Visit Number 5   Number of Visits 30   Date for PT Re-Evaluation 02/12/16   PT Start Time 1103   PT Stop Time 1159   PT Time Calculation (min) 56 min   Activity Tolerance Patient limited by pain   Behavior During Therapy Anxious      Past Medical History  Diagnosis Date  . Anxiety   . Arthritis   . Depression   . 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 Rotator cuff tear   . Medicare annual wellness visit, subsequent 10/02/2015  . Cancer ([place])   . Welcome to Medicare preventive visit 10/02/2015    Past Surgical History  Procedure Laterality Date  . Tonsillectomy    . Dental surgery      Root cleaning   . Refractive surgery Bilateral 2000    There were no vitals filed for this visit.  Visit Diagnosis:  Pain of right shoulder region  Muscle right arm weakness  Stiffness of right shoulder joint  Posture abnormality      Subjective Assessment - 12/03/15 1107    Subjective Pain improving but still very swollen with pain more tightness from the swelling.   Currently in Pain? Yes   Pain Score 4    Pain Location Shoulder   Pain Orientation Right   Pain Descriptors / Indicators Tightness           Today's Treatment  Manual Gentle retrograde massage to R UE from fingers through hand, wrist, forearm and upper arm to shoulder to facilitate edema reduction to patient tolerance R Shoulder Grade I-II AP and Caudal glides with mobs/stretch into ER, Flex, and ABD (poor tolerance for stretching today) Patient unable to tolerate R Shoulder PROM today STM to pecs in supine (also attempted in  L sidelying but unable to tolerate) TPR to R teres minor with limited pt tolerance  TherEx  B Scapular retraction x3 (limited by pain) Shoulder shrugs/circles to promote muscle relaxation (unable to tolerate0    Modalities Vasopnuematic compression to R shoulder - low pressure, lowest temp x15'           PT Short Term Goals - 08/22/15 1052    PT SHORT TERM GOAL #1   Title Indpendent with initial HEP (09/05/15)   Status Achieved           PT Long Term Goals - 11/30/15 1240    PT LONG TERM GOAL #1   Title R Shoulder AROM WFL all planes without limit by pain by 02/12/16   Status On-going   PT LONG TERM GOAL #2   Title R Shoulder MMT 4/5 or better all planes by 02/12/16   Status On-going   PT LONG TERM GOAL #3   Title Pt able to return to all ADLs, chores, and daily activities without limitation by R shoulder pain, LOM, or weakness by 02/12/16   Status On-going               Plan - 12/03/15 1200    Clinical Impression Statement Increased tightness noted in pecs and RTC muscles today with trigger point in teres minor  resulting in increased pain with ROM attempts today with patient demonstrating very limited tolerance for therapy. Attempted TRP and STM but also limited due patient sensitivity to touch stemming from persistant edema. Will look into options for edema management to reduce pain and improve tolerance for manual therapy and ROM.   PT Next Visit Plan Edema management, Gentle GH & scapulothoracic mobs and PROM as tolerated   Consulted and Agree with Plan of Care Patient        Problem List Patient Active Problem List   Diagnosis Date Noted  . Welcome to Medicare preventive visit 10/02/2015  . Cellulitis of face 09/23/2015  . Pain in joint of right shoulder 09/23/2015  . Cervical disc disorder with radiculopathy of cervical region 07/17/2015  . Colon polyp   tubular adenoma  03/2014 03/22/2014  . Hyperlipidemia 06/13/2013  . Abnormal EKG 06/13/2013  .  Osteopenia 06/13/2013  . Insomnia 06/09/2013  . 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 DJD (degenerative joint disease) 12/02/2012  . GERD (gastroesophageal reflux disease) 12/02/2012  . Eczema 12/02/2012  . Tendinitis of hand 12/02/2012    Percival Spanish, PT, MPT 12/03/2015, 12:28 PM  [facility] [address]  [place], [place], [address] Phone: [phone]   Fax:  [phone]  Name: Madelynn Shostak MRN: [id number] Date of Birth: Apr 21, 1951

## 2015-12-05 ENCOUNTER — Ambulatory Visit: Payer: Medicare HMO | Admitting: Physical Therapy

## 2015-12-05 DIAGNOSIS — M25611 Stiffness of right shoulder, not elsewhere classified: Secondary | ICD-10-CM

## 2015-12-05 DIAGNOSIS — M25511 Pain in right shoulder: Secondary | ICD-10-CM | POA: Diagnosis not present

## 2015-12-05 DIAGNOSIS — R293 Abnormal posture: Secondary | ICD-10-CM

## 2015-12-05 DIAGNOSIS — M6281 Muscle weakness (generalized): Secondary | ICD-10-CM

## 2015-12-05 NOTE — Therapy (Signed)
[facility] [address]  [place], [place], [address] Phone: [phone]   Fax:  [phone]  Physical Therapy Treatment  Patient Details  Name: Stephanie Barnes MRN: [id number] Date of Birth: 1951/07/28 Referring Provider: Dondra Spry, MD  Encounter Date: 12/05/2015      PT End of Session - 12/05/15 1150    Visit Number 6   Number of Visits 30   Date for PT Re-Evaluation 02/12/16   PT Start Time 1108   PT Stop Time 1202   PT Time Calculation (min) 54 min   Activity Tolerance Patient tolerated treatment well;Patient limited by pain   Behavior During Therapy [facility] for tasks assessed/performed      Past Medical History  Diagnosis Date  . Anxiety   . Arthritis   . Depression   . 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 Rotator cuff tear   . Medicare annual wellness visit, subsequent 10/02/2015  . Cancer ([place])   . Welcome to Medicare preventive visit 10/02/2015    Past Surgical History  Procedure Laterality Date  . Tonsillectomy    . Dental surgery      Root cleaning   . Refractive surgery Bilateral 2000    There were no vitals filed for this visit.  Visit Diagnosis:  Pain of right shoulder region  Muscle right arm weakness  Stiffness of right shoulder joint  Posture abnormality      Subjective Assessment - 12/05/15 1147    Subjective Patient reports taking arm out of sling more and allowing elbow to extend. Feels like this is helping the pain an swelling.   Currently in Pain? Yes   Pain Score 4    Pain Location Shoulder   Pain Orientation Right          Today's Treatment  Manual Gentle retrograde massage to R UE from fingers through hand, wrist, forearm and upper arm to shoulder to facilitate edema reduction to patient tolerance R Shoulder Grade 2-3 AP and Caudal glides with mobs/stretch into ER, Flex, and ABD to tolerance  R Shoulder PROM flexion/abduction, ER/IR in scapular plane to  patient tolerance  TherEx  R Finger gross flexion & opposition R Wrist flexion & extension AROM x10 R Forearm pronation/supination AROM x10 with manual assist for full motion at end range R Elbow flexion/extension AROM x10 R PT assisted AAROM right shoulder flexion and ER/IR (cane/wand deferred due to pain and limited grip in R hand)  Modalities Vasopnuematic compression to R shoulder - low pressure, lowest temp x15'           PT Short Term Goals - 08/22/15 1052    PT SHORT TERM GOAL #1   Title Indpendent with initial HEP (09/05/15)   Status Achieved           PT Long Term Goals - 12/05/15 1319    PT LONG TERM GOAL #1   Title R Shoulder AROM WFL all planes without limit by pain by 02/12/16   Status On-going   PT LONG TERM GOAL #2   Title R Shoulder MMT 4/5 or better all planes by 02/12/16   Status On-going   PT LONG TERM GOAL #3   Title Pt able to return to all ADLs, chores, and daily activities without limitation by R shoulder pain, LOM, or weakness by 02/12/16   Status On-going               Plan - 12/05/15 1300  Clinical Impression Statement Patient with decreased pain today and apparent resolution of teres minor trigger point. Also with slightly decreased edema today with slightly less sensitivity to touch in forearm/wrist/hand allowing for improved patient tolerance to handling of UE during manual therapy and PROM. Working to have patient seen by PT specializing in edema management, hopefully early next week due to persistannt excessive right UE edema limiting therapy tolerance and progression.   PT Next Visit Plan Edema management, Gentle GH & scapulothoracic mobs and PROM as tolerated   Consulted and Agree with Plan of Care Patient        Problem List Patient Active Problem List   Diagnosis Date Noted  . Welcome to Medicare preventive visit 10/02/2015  . Cellulitis of face 09/23/2015  . Pain in joint of right shoulder 09/23/2015  . Cervical disc  disorder with radiculopathy of cervical region 07/17/2015  . Colon polyp   tubular adenoma  03/2014 03/22/2014  . Hyperlipidemia 06/13/2013  . Abnormal EKG 06/13/2013  . Osteopenia 06/13/2013  . Insomnia 06/09/2013  . 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 DJD (degenerative joint disease) 12/02/2012  . GERD (gastroesophageal reflux disease) 12/02/2012  . Eczema 12/02/2012  . Tendinitis of hand 12/02/2012    Percival Spanish, PT, MPT 12/05/2015, 1:21 PM  [facility] [address]  [place], [place], [address] Phone: [phone]   Fax:  [phone]  Name: Stephanie Barnes MRN: [id number] Date of Birth: 13-Jul-1951

## 2015-12-07 ENCOUNTER — Ambulatory Visit: Payer: Medicare HMO | Admitting: Physical Therapy

## 2015-12-07 ENCOUNTER — Telehealth: Payer: Self-pay | Admitting: Family Medicine

## 2015-12-07 DIAGNOSIS — M25511 Pain in right shoulder: Secondary | ICD-10-CM | POA: Diagnosis not present

## 2015-12-07 DIAGNOSIS — R531 Weakness: Secondary | ICD-10-CM

## 2015-12-07 DIAGNOSIS — M6281 Muscle weakness (generalized): Secondary | ICD-10-CM

## 2015-12-07 DIAGNOSIS — M25611 Stiffness of right shoulder, not elsewhere classified: Secondary | ICD-10-CM

## 2015-12-07 DIAGNOSIS — R293 Abnormal posture: Secondary | ICD-10-CM

## 2015-12-07 DIAGNOSIS — R5383 Other fatigue: Secondary | ICD-10-CM

## 2015-12-07 DIAGNOSIS — M7989 Other specified soft tissue disorders: Secondary | ICD-10-CM

## 2015-12-07 NOTE — Therapy (Signed)
[facility] [address]  [place], [place], [address] Phone: [phone]   Fax:  [phone]  Physical Therapy Treatment  Patient Details  Name: Stephanie Barnes MRN: [id number] Date of Birth: February 26, 1951 Referring Provider: Dondra Spry, MD  Encounter Date: 12/07/2015      PT End of Session - 12/07/15 1214    Visit Number 7   Number of Visits 30   Date for PT Re-Evaluation 02/12/16   PT Start Time 1100   PT Stop Time 1200   PT Time Calculation (min) 60 min   Activity Tolerance Patient tolerated treatment well;Patient limited by pain   Behavior During Therapy [facility] for tasks assessed/performed      Past Medical History  Diagnosis Date   Anxiety    Arthritis    Depression    Depression with anxiety 12/02/2012   Rotator cuff tear    Medicare annual wellness visit, subsequent 10/02/2015   Cancer ([place])    Welcome to Medicare preventive visit 10/02/2015    Past Surgical History  Procedure Laterality Date   Tonsillectomy     Dental surgery      Root cleaning    Refractive surgery Bilateral 2000    There were no vitals filed for this visit.  Visit Diagnosis:  Pain of right shoulder region  Muscle right arm weakness  Stiffness of right shoulder joint  Posture abnormality      Subjective Assessment - 12/07/15 1210    Subjective Having a good day today.  Does cont to report ongoing swelling throughout Right UE and hand.  Increased joint pain when swollen in fingers, hand, wrist, elbow and shoulder.  Feels better when she can come out of her sling.  Her swelling will increase distally when out of the sling and will ultimately cause her increased shoulder pain d/t increased weight of her arm.  Is to see Butch Penny  at the [facility] location on Monday for likely lymphedema treatment to help combat her ongoing swelling.     Currently in Pain? Yes   Pain Score 4    Pain Location Shoulder   Pain  Orientation Right   Pain Descriptors / Indicators Tightness   Pain Type Surgical pain     Today's Treatment; Manual; Gr II post/ inferior GH mobs Gentle retro grade massage to Right fingers, hand, wrist, and elbow. PROM Right fingers, hand, wrist, elbow and hand. PROM Right shoulder.  ER with Low trunk rotation(knees Left) to facilitate stretching of posterior fascial chain.  Therex; Lumbrical grip finger AROM  Gentle opposition gripping 10x 5" hold. AROM wrist pronate/ supinate 10x ea Arms crossed chest AROM trunk diagonals for AAROM scapula abduction/ adduction. 15x ea Gentle AAROM flexion counter top slides 7x Trunk flexionin standing for self PROM shoulder flexion 7x  Vasopnuematic compression/ cryotherapy Lowest pressue and temperature x15'                              PT Short Term Goals - 08/22/15 1052    PT SHORT TERM GOAL #1   Title Indpendent with initial HEP (09/05/15)   Status Achieved           PT Long Term Goals - 12/05/15 1319    PT LONG TERM GOAL #1   Title R Shoulder AROM WFL all planes without limit by pain by 02/12/16   Status On-going   PT LONG TERM GOAL #2  Title R Shoulder MMT 4/5 or better all planes by 02/12/16   Status On-going   PT LONG TERM GOAL #3   Title Pt able to return to all ADLs, chores, and daily activities without limitation by R shoulder pain, LOM, or weakness by 02/12/16   Status On-going               Plan - 12/07/15 1214    Clinical Impression Statement Cont with limited tolerance to treatment d/t ongoing pain.  Edematous Right upper extremity causing joint pain throughout also limiting tolerance for ROM at fingers, hand, wrist, elbow, and shoulder.  She was able to achieve increased abduction and external rotation ROM vs previous visits per her report.  We did discuss seein gher primary care physician for some potential diagnostic test and possibly blood work d/t the unusual nature of her upper  edematous upper extremity.     Rehab Potential Good   Clinical Impairments Affecting Rehab Potential Limited pain control, Extensive edema in right UE, Anxiety - Patient apprehension/fear of movement with guarding/self-limiting behavior   PT Next Visit Plan Edema management, Gentle GH & scapulothoracic mobs and PROM as tolerated        Problem List Patient Active Problem List   Diagnosis Date Noted   Welcome to Medicare preventive visit 10/02/2015   Cellulitis of face 09/23/2015   Pain in joint of right shoulder 09/23/2015   Cervical disc disorder with radiculopathy of cervical region 07/17/2015   Colon polyp   tubular adenoma  03/2014 03/22/2014   Hyperlipidemia 06/13/2013   Abnormal EKG 06/13/2013   Osteopenia 06/13/2013   Insomnia 06/09/2013   Depression with anxiety 12/02/2012   DJD (degenerative joint disease) 12/02/2012   GERD (gastroesophageal reflux disease) 12/02/2012   Eczema 12/02/2012   Tendinitis of hand 12/02/2012    Olean Ree, PTA 12/07/2015, 12:19 PM  [facility] [address]  [place], [place], [address] Phone: [phone]   Fax:  [phone]  Name: Stephanie Barnes MRN: [id number] Date of Birth: July 04, 1951

## 2015-12-07 NOTE — Telephone Encounter (Signed)
Caller name:Shriya Relationship to patient:self Can be [phone] Pharmacy: [facility] [address]   Reason for call:Had shoulder surgery on 10-22-2015 on right shoulder.  She has been doing pt across the hall.  Her arm and hand are swollen.  She has had an ultrasound for blood clots and there are none.  The physical therapist suggested that she  Ask you for an order for urine and bloodwork to rule out anything else in her system.  She will be here on Wednesday next week for pt and would like to do the labs then

## 2015-12-08 NOTE — Telephone Encounter (Signed)
OK 

## 2015-12-10 ENCOUNTER — Ambulatory Visit: Payer: Medicare HMO | Admitting: Physical Therapy

## 2015-12-10 ENCOUNTER — Encounter: Payer: Self-pay | Admitting: Physical Therapy

## 2015-12-10 DIAGNOSIS — M25511 Pain in right shoulder: Secondary | ICD-10-CM | POA: Diagnosis not present

## 2015-12-10 DIAGNOSIS — R600 Localized edema: Secondary | ICD-10-CM

## 2015-12-10 NOTE — Telephone Encounter (Signed)
Advise on what labs/urine to order

## 2015-12-10 NOTE — Telephone Encounter (Signed)
Labs ordered as requested/instructed, Called the patient to call back that she received message.

## 2015-12-10 NOTE — Telephone Encounter (Signed)
I thought she wanted to do labs at visit and that she had a visit on Wed. If she wants them without seeing me then UA, CMP, CBC with diff and sed rate for arm pain, arm swelling, fatigue, weakness

## 2015-12-10 NOTE — Therapy (Addendum)
[place], [place], [address] Phone: [phone]   Fax:  [phone]  Physical Therapy Treatment  Patient Details  Name: Stephanie Barnes MRN: [id number] Date of Birth: [birth year] Referring Provider: Dondra Spry, MD  Encounter Date: 12/10/2015    Past Medical History  Diagnosis Date  . Anxiety   . Arthritis   . Depression   . 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 Rotator cuff tear   . Medicare annual wellness visit, subsequent 10/02/2015  . Cancer ([place])   . Welcome to Medicare preventive visit 10/02/2015    Past Surgical History  Procedure Laterality Date  . Tonsillectomy    . Dental surgery      Root cleaning   . Refractive surgery Bilateral 2000    There were no vitals filed for this visit.  Visit Diagnosis:  Edema of upper extremity - Plan: PT plan of care cert/re-cert                                 PT Short Term Goals - 08/22/15 1052    PT SHORT TERM GOAL #1   Title Indpendent with initial HEP (09/05/15)   Status Achieved           PT Long Term Goals - 12/05/15 1319    PT LONG TERM GOAL #1   Title R Shoulder AROM WFL all planes without limit by pain by 02/12/16   Status On-going   PT LONG TERM GOAL #2   Title R Shoulder MMT 4/5 or better all planes by 02/12/16   Status On-going   PT LONG TERM GOAL #3   Title Pt able to return to all ADLs, chores, and daily activities without limitation by R shoulder pain, LOM, or weakness by 02/12/16   Status On-going           Long Term Clinic Goals - 12/10/15 1721    CC Long Term Goal  #1   Title Patient will be knowledgeable about self-management of right UE edema including where and how to obtain compression garments   Time 2   Period Weeks   Status New            Problem List Patient Active Problem List   Diagnosis Date Noted  . Welcome to Medicare preventive visit 10/02/2015  .  Cellulitis of face 09/23/2015  . Pain in joint of right shoulder 09/23/2015  . Cervical disc disorder with radiculopathy of cervical region 07/17/2015  . Colon polyp   tubular adenoma  03/2014 03/22/2014  . Hyperlipidemia 06/13/2013  . Abnormal EKG 06/13/2013  . Osteopenia 06/13/2013  . Insomnia 06/09/2013  . 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 DJD (degenerative joint disease) 12/02/2012  . GERD (gastroesophageal reflux disease) 12/02/2012  . Eczema 12/02/2012  . Tendinitis of hand 12/02/2012    Analisa Sledd 12/12/2015, 8:43 AM  [place], [place], [address] Phone: [phone]   Fax:  [phone]  Name: Deanne Volpe MRN: [id number] Date of Birth: 1951-11-05    Serafina Royals, PT 12/12/2015 8:43 AM

## 2015-12-11 NOTE — Telephone Encounter (Signed)
Patient informed labs ordered and made lab appt. For tomorrow 12/12/2015

## 2015-12-12 ENCOUNTER — Ambulatory Visit: Payer: Medicare HMO | Attending: Specialist | Admitting: Physical Therapy

## 2015-12-12 ENCOUNTER — Encounter: Payer: Self-pay | Admitting: Family Medicine

## 2015-12-12 ENCOUNTER — Other Ambulatory Visit (INDEPENDENT_AMBULATORY_CARE_PROVIDER_SITE_OTHER): Payer: Medicare HMO

## 2015-12-12 DIAGNOSIS — R609 Edema, unspecified: Secondary | ICD-10-CM | POA: Diagnosis not present

## 2015-12-12 DIAGNOSIS — M7989 Other specified soft tissue disorders: Secondary | ICD-10-CM | POA: Diagnosis not present

## 2015-12-12 DIAGNOSIS — M25511 Pain in right shoulder: Secondary | ICD-10-CM | POA: Diagnosis not present

## 2015-12-12 DIAGNOSIS — M6281 Muscle weakness (generalized): Secondary | ICD-10-CM

## 2015-12-12 DIAGNOSIS — R5383 Other fatigue: Secondary | ICD-10-CM | POA: Diagnosis not present

## 2015-12-12 DIAGNOSIS — R293 Abnormal posture: Secondary | ICD-10-CM | POA: Diagnosis not present

## 2015-12-12 DIAGNOSIS — M25611 Stiffness of right shoulder, not elsewhere classified: Secondary | ICD-10-CM | POA: Diagnosis not present

## 2015-12-12 DIAGNOSIS — R531 Weakness: Secondary | ICD-10-CM | POA: Diagnosis not present

## 2015-12-12 DIAGNOSIS — R600 Localized edema: Secondary | ICD-10-CM

## 2015-12-12 LAB — CBC WITH DIFFERENTIAL/PLATELET
BASOS PCT: 0.6 % (ref 0.0–3.0)
Basophils Absolute: 0 10*3/uL (ref 0.0–0.1)
EOS PCT: 2.2 % (ref 0.0–5.0)
Eosinophils Absolute: 0.1 10*3/uL (ref 0.0–0.7)
HCT: 38.1 % (ref 36.0–46.0)
Hemoglobin: 12.6 g/dL (ref 12.0–15.0)
LYMPHS ABS: 2.4 10*3/uL (ref 0.7–4.0)
Lymphocytes Relative: 39.5 % (ref 12.0–46.0)
MCHC: 33.2 g/dL (ref 30.0–36.0)
MCV: 89.2 fl (ref 78.0–100.0)
MONO ABS: 0.3 10*3/uL (ref 0.1–1.0)
MONOS PCT: 5.6 % (ref 3.0–12.0)
NEUTROS ABS: 3.2 10*3/uL (ref 1.4–7.7)
NEUTROS PCT: 52.1 % (ref 43.0–77.0)
Platelets: 215 10*3/uL (ref 150.0–400.0)
RBC: 4.26 Mil/uL (ref 3.87–5.11)
RDW: 13.4 % (ref 11.5–15.5)
WBC: 6.1 10*3/uL (ref 4.0–10.5)

## 2015-12-12 LAB — COMPREHENSIVE METABOLIC PANEL
ALBUMIN: 4.3 g/dL (ref 3.5–5.2)
ALK PHOS: 42 U/L (ref 39–117)
ALT: 19 U/L (ref 0–35)
AST: 15 U/L (ref 0–37)
BUN: 20 mg/dL (ref 6–23)
CALCIUM: 9.3 mg/dL (ref 8.4–10.5)
CO2: 29 mEq/L (ref 19–32)
Chloride: 102 mEq/L (ref 96–112)
Creatinine, Ser: 0.81 mg/dL (ref 0.40–1.20)
GFR: 75.33 mL/min (ref >60.00)
Glucose, Bld: 95 mg/dL (ref 70–99)
POTASSIUM: 3.1 meq/L — AB (ref 3.5–5.1)
Sodium: 141 mEq/L (ref 135–145)
TOTAL PROTEIN: 7.1 g/dL (ref 6.0–8.3)
Total Bilirubin: 0.4 mg/dL (ref 0.2–1.2)

## 2015-12-12 LAB — SEDIMENTATION RATE: SED RATE: 15 mm/h (ref 0–22)

## 2015-12-12 NOTE — Therapy (Signed)
[facility] [address]  [place], [place], [address] Phone: [phone]   Fax:  [phone]  Physical Therapy Treatment  Patient Details  Name: Stephanie Barnes MRN: [id number] Date of Birth: 28-Dec-1950 Referring Provider: Dondra Spry, MD  Encounter Date: 12/12/2015      PT End of Session - 12/12/15 1249    Visit Number 9   Number of Visits 30   Date for PT Re-Evaluation 02/12/16   PT Start Time [id number]   PT Stop Time 1300   PT Time Calculation (min) 71 min   Activity Tolerance Patient limited by pain   Behavior During Therapy [facility] for tasks assessed/performed;Anxious      Past Medical History  Diagnosis Date  . Anxiety   . Arthritis   . Depression   . 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 Rotator cuff tear   . Medicare annual wellness visit, subsequent 10/02/2015  . Cancer ([place])   . Welcome to Medicare preventive visit 10/02/2015    Past Surgical History  Procedure Laterality Date  . Tonsillectomy    . Dental surgery      Root cleaning   . Refractive surgery Bilateral 2000    There were no vitals filed for this visit.  Visit Diagnosis:  Edema of upper extremity  Pain of right shoulder region  Muscle right arm weakness  Stiffness of right shoulder joint  Posture abnormality      Subjective Assessment - 12/12/15 1306    Subjective Swelling and pain increased today which patient thinks may be from not having her normal therapy session at last visit (seen for edema management assessment). Concerned about the cost of compression garments for edema management but knows they are necessary to help promote increased participation in shoulder rehab.   Currently in Pain? Yes   Pain Score --  5-6/10   Pain Location Shoulder   Pain Orientation Right         Today's Treatment  Manual Gentle retrograde massage to R UE from fingers through hand, wrist, forearm and upper arm to shoulder to  facilitate edema reduction to patient tolerance R Shoulder Grade 2 AP and Caudal GH glides with mobs/stretch into ER, Flex, and ABD to tolerance  R Shoulder PROM flexion/abduction, ER/IR in scapular plane to patient tolerance PROM Right fingers, hand, wrist, elbow and hand  TherEx  Shoulder rolls to promote relaxation x10 Scapular retraction with PT supporting R arm x10, with R arm resting on table x10 R Shoulder Flexion table slide x5 (limited motion due to pain) R Shoulder Flexion & Abduction self-PROM standing with arm resting on counter and stepping back/away x5 Arms crossed chest AROM trunk diagonals for AAROM scapula abduction/ adduction x15x   Modalities Vasopnuematic compression to R shoulder - low pressure, lowest temp x20' Ice pack to right forearm x 20'         PT Education - 12/12/15 1241    Education provided Yes   Education Details Scapular exercises, Shoulder PROM/AAROM on table/counter   Person(s) Educated Patient   Methods Explanation;Demonstration;Handout   Comprehension Verbalized understanding;Returned demonstration;Need further instruction          PT Short Term Goals - 08/22/15 1052    PT SHORT TERM GOAL #1   Title Indpendent with initial HEP (09/05/15)   Status Achieved           PT Long Term Goals - 12/12/15 1327    PT LONG TERM GOAL #1  Title R Shoulder AROM WFL all planes without limit by pain by 02/12/16   Status On-going   PT LONG TERM GOAL #2   Title R Shoulder MMT 4/5 or better all planes by 02/12/16   Status On-going   PT LONG TERM GOAL #3   Title Pt able to return to all ADLs, chores, and daily activities without limitation by R shoulder pain, LOM, or weakness by 02/12/16   Status On-going           Long Term Clinic Goals - 12/10/15 1721    CC Long Term Goal  #1   Title Patient will be knowledgeable about self-management of right UE edema including where and how to obtain compression garments   Time 2   Period Weeks   Status  New            Plan - 12/12/15 1312    Clinical Impression Statement Very limited tolerance for manual therapy and PROM by PT today due to increased edema and pain in arm/hand. Increased muscle tension noted in biceps and lateral deltoid but denies acute poitn tenderness typically experienced when she has trigger points. Reviewed shoulder rolls and scapular retratction w/o resistance to promote relaxation and improve tolerance for shoulder ROM and iInstructed patient in self-PROM for shoulder, but pt again demonstrating limited tolerance. Given continued swelling and pain with limited tolerance for arm being touched, question if pt not showing signs of RDS/CRPS (not mentioned to patient but will address possibility in MD note next week). Patient to have compression wrap applied by PT tomorrow and then fitted for compression garment on Friday so next regular PT visit will be Mon 12/17/15 prior to MD visit on Tues 12/18/15.   PT Next Visit Plan Edema management, Gentle GH & scapulothoracic mobs and PROM as tolerated.  For edema management, plan to do manual lymph drainage and bandage her right arm the day prior to her being fitted for a CircAid compression garment to use on a regular basis to control right UE swelling.   Consulted and Agree with Plan of Care Patient        Problem List Patient Active Problem List   Diagnosis Date Noted  . Welcome to Medicare preventive visit 10/02/2015  . Cellulitis of face 09/23/2015  . Pain in joint of right shoulder 09/23/2015  . Cervical disc disorder with radiculopathy of cervical region 07/17/2015  . Colon polyp   tubular adenoma  03/2014 03/22/2014  . Hyperlipidemia 06/13/2013  . Abnormal EKG 06/13/2013  . Osteopenia 06/13/2013  . Insomnia 06/09/2013  . 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 DJD (degenerative joint disease) 12/02/2012  . GERD (gastroesophageal reflux disease) 12/02/2012  . Eczema 12/02/2012  . Tendinitis of hand 12/02/2012     Percival Spanish, PT, MPT 12/12/2015, 1:28 PM  [facility] [address]  [place], [place], [address] Phone: [phone]   Fax:  [phone]  Name: Stephanie Barnes MRN: [id number] Date of Birth: [birth year]

## 2015-12-12 NOTE — Addendum Note (Signed)
Addended by: Jomarie Longs on: 12/12/2015 08:44 AM   Modules accepted: Orders

## 2015-12-13 ENCOUNTER — Ambulatory Visit: Payer: Medicare HMO | Admitting: Physical Therapy

## 2015-12-13 ENCOUNTER — Other Ambulatory Visit: Payer: Self-pay | Admitting: Family Medicine

## 2015-12-13 DIAGNOSIS — E876 Hypokalemia: Secondary | ICD-10-CM

## 2015-12-13 DIAGNOSIS — R600 Localized edema: Secondary | ICD-10-CM

## 2015-12-13 DIAGNOSIS — R609 Edema, unspecified: Secondary | ICD-10-CM | POA: Diagnosis not present

## 2015-12-13 LAB — URINALYSIS
Bilirubin Urine: NEGATIVE
Hgb urine dipstick: NEGATIVE
KETONES UR: NEGATIVE
Leukocytes, UA: NEGATIVE
Nitrite: NEGATIVE
PH: 6 (ref 5.0–8.0)
SPECIFIC GRAVITY, URINE: 1.025 (ref 1.000–1.030)
TOTAL PROTEIN, URINE-UPE24: NEGATIVE
URINE GLUCOSE: NEGATIVE
UROBILINOGEN UA: 0.2 (ref 0.0–1.0)

## 2015-12-13 MED ORDER — POTASSIUM CHLORIDE CRYS ER 20 MEQ PO TBCR: EXTENDED_RELEASE_TABLET | ORAL | Status: DC

## 2015-12-13 NOTE — Therapy (Addendum)
[place], [place], [address] Phone: [phone]   Fax:  [phone]  Physical Therapy Treatment  Patient Details  Name: Stephanie Barnes MRN: [id number] Date of Birth: 04-Jan-1951 Referring Provider: Dondra Spry, MD  Encounter Date: 12/13/2015      PT End of Session - 12/13/15 1733    Visit Number 10   Number of Visits 30   Date for PT Re-Evaluation 02/12/16   PT Start Time [id number]   PT Stop Time 1430   PT Time Calculation (min) 45 min   Activity Tolerance Patient limited by pain   Behavior During Therapy [facility] for tasks assessed/performed      Past Medical History  Diagnosis Date  . Anxiety   . Arthritis   . Depression   . 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 Rotator cuff tear   . Medicare annual wellness visit, subsequent 10/02/2015  . Cancer ([place])   . Welcome to Medicare preventive visit 10/02/2015    Past Surgical History  Procedure Laterality Date  . Tonsillectomy    . Dental surgery      Root cleaning   . Refractive surgery Bilateral 2000    There were no vitals filed for this visit.  Visit Diagnosis:  Edema of upper extremity      Subjective Assessment - 12/13/15 1723    Subjective Pt states she wants to do whatever she can to get better    Pertinent History has had a Doppler checking for right arm DVT, which was negative.  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     Currently in Pain? Yes   Pain Score --  did not rate, pt states she was very sore, especially in hand    Pain Location Hand   Pain Orientation Right   Pain Descriptors / Indicators Aching   Pain Radiating Towards --   Pain Onset --   Pain Frequency Constant   Aggravating Factors  dependent position    Pain Relieving Factors elevation                          [facility] Adult PT Treatment/Exercise - 12/13/15 0001    Manual Therapy   Manual Therapy Manual Lymphatic Drainage (MLD);Compression Bandaging   Manual Lymphatic Drainage (MLD) brief introduction to Manual lymph drainage technique with diaphragmatic breathing and brief manua lymph drainage to forearm and hand with hand elevated    Compression Bandaging Lotion applies to arm and hand.  thick tg soft stockinette, 2 elastomull to fingers and hand as tolerates. 1/4 inch foam to antecubital fossa and back of hand with 1 artiflex, 1-6 , 1-8 and 1-10 inch short stretch bandages. Attempted a fourth bandage, but pt was unable to bend elbow enough to position in sling so it was removed and assisted pt to wear sling                 PT Education - 12/12/15 [id number]    Education provided Yes   Education Details Scapular exercises, Shoulder PROM/AAROM on table/counter   Person(s) Educated Patient   Methods Explanation;Demonstration;Handout   Comprehension Verbalized understanding;Returned demonstration;Need further instruction          PT Short Term Goals - 08/22/15 1052    PT SHORT TERM GOAL #1   Title Indpendent with initial HEP (09/05/15)   Status Achieved  PT Long Term Goals - 12/12/15 1327    PT LONG TERM GOAL #1   Title R Shoulder AROM WFL all planes without limit by pain by 02/12/16   Status On-going   PT LONG TERM GOAL #2   Title R Shoulder MMT 4/5 or better all planes by 02/12/16   Status On-going   PT LONG TERM GOAL #3   Title Pt able to return to all ADLs, chores, and daily activities without limitation by R shoulder pain, LOM, or weakness by 02/12/16   Status On-going           Long Term Clinic Goals - 25-Dec-2015 1732    CC Long Term Goal  #1   Title Patient will be knowledgeable about self-management of right UE edema including where and how to obtain compression garments   Status Achieved            Plan - 25-Dec-2015 1734    Clinical Impression Statement Pt here today for compression wrapping in prepararation for  getting compression garment and glove tomorrow.  She did have some reduction in swelling in hand with elevation and mild manual lymph drainage so anticipate she will responde to compression wrapping as plannned and be able to improve mobility once edema is managed.   She will return to her regular PT and we will be avaialbe for consult again as needed for edema management  suggestions... she may benefit from use of kinesiotape to help with lymphatic flow especially at forearm and hand    Pt will benefit from skilled therapeutic intervention in order to improve on the following deficits Increased edema;Pain   Clinical Impairments Affecting Rehab Potential Limited pain control, Extensive edema in right UE, Anxiety - Patient apprehension/fear of movement with guarding/self-limiting behavior   PT Next Visit Plan Assess effect of compression garments Edema management, Gentle GH & scapulothoracic mobs and PROM as tolerated.         Problem List Patient Active Problem List   Diagnosis Date Noted  . Welcome to Medicare preventive visit 10/02/2015  . Cellulitis of face 09/23/2015  . Pain in joint of right shoulder 09/23/2015  . Cervical disc disorder with radiculopathy of cervical region 07/17/2015  . Colon polyp   tubular adenoma  03/2014 03/22/2014  . Hyperlipidemia 06/13/2013  . Abnormal EKG 06/13/2013  . Osteopenia 06/13/2013  . Insomnia 06/09/2013  . 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 DJD (degenerative joint disease) 12/02/2012  . GERD (gastroesophageal reflux disease) 12/02/2012  . Eczema 12/02/2012  . Tendinitis of hand 12/02/2012   Donato Heinz. Owens Shark, PT  December 25, 2015, 5:39 PM  [place], [place], [address] Phone: [phone]   Fax:  [phone]  Name: Umme Host MRN: [id number] Date of Birth: 28-Feb-1951    G-Codes - 2015/12/25 1345    Functional Assessment Tool Used Shoulder FOTO = 0% (100%  limitation)   Functional Limitation Carrying, moving and handling objects   Carrying, Moving and Handling Objects Current Status [phone]) 100 percent impaired, limited or restricted   Carrying, Moving and Handling Objects Goal Status [phone]) At least 40 percent but less than 60 percent impaired, limited or restricted         Percival Spanish, PT, MPT 12/19/2015, 1:06 PM  [facility] [address]  [place], [place], [address] Phone: [phone]   Fax:  [phone]

## 2015-12-14 ENCOUNTER — Ambulatory Visit: Payer: Medicare HMO | Admitting: Physical Therapy

## 2015-12-14 DIAGNOSIS — S46011D Strain of muscle(s) and tendon(s) of the rotator cuff of right shoulder, subsequent encounter: Secondary | ICD-10-CM | POA: Diagnosis not present

## 2015-12-14 DIAGNOSIS — Z4789 Encounter for other orthopedic aftercare: Secondary | ICD-10-CM | POA: Diagnosis not present

## 2015-12-17 ENCOUNTER — Ambulatory Visit: Payer: Medicare HMO | Admitting: Physical Therapy

## 2015-12-17 DIAGNOSIS — R600 Localized edema: Secondary | ICD-10-CM

## 2015-12-17 DIAGNOSIS — R293 Abnormal posture: Secondary | ICD-10-CM

## 2015-12-17 DIAGNOSIS — R609 Edema, unspecified: Secondary | ICD-10-CM | POA: Diagnosis not present

## 2015-12-17 DIAGNOSIS — M6281 Muscle weakness (generalized): Secondary | ICD-10-CM

## 2015-12-17 DIAGNOSIS — M25511 Pain in right shoulder: Secondary | ICD-10-CM

## 2015-12-17 DIAGNOSIS — M25611 Stiffness of right shoulder, not elsewhere classified: Secondary | ICD-10-CM

## 2015-12-17 NOTE — Therapy (Signed)
[facility] [address]  [place], [place], [address] Phone: [phone]   Fax:  [phone]  Physical Therapy Treatment  Patient Details  Name: Stephanie Barnes MRN: [id number] Date of Birth: 30-Sep-1951 Referring Provider: Dondra Spry, MD  Encounter Date: 12/17/2015      PT End of Session - 12/17/15 1031    Visit Number 11   Number of Visits 30   Date for PT Re-Evaluation 02/12/16   PT Start Time 1020   PT Stop Time 1115   PT Time Calculation (min) 55 min   Activity Tolerance Patient tolerated treatment well;Patient limited by pain   Behavior During Therapy [facility] for tasks assessed/performed      Past Medical History  Diagnosis Date  . Anxiety   . Arthritis   . Depression   . 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 Rotator cuff tear   . Medicare annual wellness visit, subsequent 10/02/2015  . Cancer ([place])   . Welcome to Medicare preventive visit 10/02/2015    Past Surgical History  Procedure Laterality Date  . Tonsillectomy    . Dental surgery      Root cleaning   . Refractive surgery Bilateral 2000    There were no vitals filed for this visit.  Visit Diagnosis:  Edema of upper extremity  Pain of right shoulder region  Muscle right arm weakness  Stiffness of right shoulder joint  Posture abnormality      Subjective Assessment - 12/17/15 1023    Subjective Patient reports Dr Tonita Cong had her come in early to see him on Friday (original appt was to be this week) and was shocked to see degree of edema. MD OK with recommended compression garments and instructed her to start sleeping in the bed with arm elevated above heart on pillow and patient has noted improvment in swelling with this. Compresssion garment on order. Patient also reports lab work revealed low potassium and was started on potassium supplement. Patient to return to Dr. Tonita Cong again this Friday.   Patient Stated Goals Get back on the  golfcourse, to drive, do therapy without limitation from swelling.   Currently in Pain? Yes   Pain Score 4    Pain Location Shoulder   Pain Orientation Right;Anterior;Distal;Lateral   Pain Descriptors / Indicators Aching          Today's Treatment  Manual R teres minor multiple TPR R Shoulder Grade 2 AP and inferior GH glides with mobs/stretch into ER, Flex, and ABD to tolerance  R Shoulder PROM flexion/abduction, ER/IR in scapular plane to patient tolerance R scapular mobs in L sidelying  TherEx  R Forearm pronation/supination AROM x10 with manual assist for full motion at end range R Elbow flexion/extension AROM 2x10 R PT assisted AAROM right shoulder flexion and ER/IR (cane/wand deferred due to pain and limited grip in R hand)  Modalities Vasopnuematic compression to R shoulder - low pressure, lowest temp x15' Ice pack to right forearm x 15'           PT Short Term Goals - 08/22/15 1052    PT SHORT TERM GOAL #1   Title Indpendent with initial HEP (09/05/15)   Status Achieved           PT Long Term Goals - 12/12/15 1327    PT LONG TERM GOAL #1   Title R Shoulder AROM WFL all planes without limit by pain by 02/12/16   Status On-going   PT  LONG TERM GOAL #2   Title R Shoulder MMT 4/5 or better all planes by 02/12/16   Status On-going   PT LONG TERM GOAL #3   Title Pt able to return to all ADLs, chores, and daily activities without limitation by R shoulder pain, LOM, or weakness by 02/12/16   Status On-going           Long Term Clinic Goals - 12/13/15 1732    CC Long Term Goal  #1   Title Patient will be knowledgeable about self-management of right UE edema including where and how to obtain compression garments   Status Achieved            Plan - 12/17/15 1230    Clinical Impression Statement Still with significant swelling but not quite as bad as previously since sleeping in bed for last few nights with slightly improved tolerance for PT handling  due to reduced pain with decreased edema. Increased pain in lateral arm with multiple trigger points idenfied in teres minor today still limiting tolerance for shoulder ROM.   PT Next Visit Plan MD note, Assess effect of compression garments, Edema management, Gentle GH & scapulothoracic mobs and PROM as tolerated.    Consulted and Agree with Plan of Care Patient        Problem List Patient Active Problem List   Diagnosis Date Noted  . Welcome to Medicare preventive visit 10/02/2015  . Cellulitis of face 09/23/2015  . Pain in joint of right shoulder 09/23/2015  . Cervical disc disorder with radiculopathy of cervical region 07/17/2015  . Colon polyp   tubular adenoma  03/2014 03/22/2014  . Hyperlipidemia 06/13/2013  . Abnormal EKG 06/13/2013  . Osteopenia 06/13/2013  . Insomnia 06/09/2013  . 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 DJD (degenerative joint disease) 12/02/2012  . GERD (gastroesophageal reflux disease) 12/02/2012  . Eczema 12/02/2012  . Tendinitis of hand 12/02/2012    Percival Spanish, PT, MPT 12/17/2015, 12:38 PM  [facility] [address]  [place], [place], [address] Phone: [phone]   Fax:  [phone]  Name: Stephanie Barnes MRN: [id number] Date of Birth: 31-Jan-1951

## 2015-12-19 ENCOUNTER — Other Ambulatory Visit (INDEPENDENT_AMBULATORY_CARE_PROVIDER_SITE_OTHER): Payer: Medicare HMO

## 2015-12-19 ENCOUNTER — Ambulatory Visit: Payer: Medicare HMO | Admitting: Physical Therapy

## 2015-12-19 DIAGNOSIS — M25611 Stiffness of right shoulder, not elsewhere classified: Secondary | ICD-10-CM

## 2015-12-19 DIAGNOSIS — E876 Hypokalemia: Secondary | ICD-10-CM

## 2015-12-19 DIAGNOSIS — R600 Localized edema: Secondary | ICD-10-CM

## 2015-12-19 DIAGNOSIS — M25511 Pain in right shoulder: Secondary | ICD-10-CM

## 2015-12-19 DIAGNOSIS — R293 Abnormal posture: Secondary | ICD-10-CM

## 2015-12-19 DIAGNOSIS — R609 Edema, unspecified: Secondary | ICD-10-CM | POA: Diagnosis not present

## 2015-12-19 DIAGNOSIS — M6281 Muscle weakness (generalized): Secondary | ICD-10-CM

## 2015-12-19 LAB — COMPREHENSIVE METABOLIC PANEL
ALBUMIN: 4.3 g/dL (ref 3.5–5.2)
ALT: 11 U/L (ref 0–35)
AST: 12 U/L (ref 0–37)
Alkaline Phosphatase: 42 U/L (ref 39–117)
BUN: 15 mg/dL (ref 6–23)
CHLORIDE: 103 meq/L (ref 96–112)
CO2: 27 meq/L (ref 19–32)
CREATININE: 0.89 mg/dL (ref 0.40–1.20)
Calcium: 9.8 mg/dL (ref 8.4–10.5)
GFR: 67.57 mL/min (ref >60.00)
Glucose, Bld: 100 mg/dL — ABNORMAL HIGH (ref 70–99)
Potassium: 3.8 mEq/L (ref 3.5–5.1)
SODIUM: 139 meq/L (ref 135–145)
Total Bilirubin: 0.4 mg/dL (ref 0.2–1.2)
Total Protein: 7.1 g/dL (ref 6.0–8.3)

## 2015-12-19 NOTE — Therapy (Signed)
[facility] [facility] [address]  [place], [place], [address] Phone: [phone]   Fax:  [phone]  Physical Therapy Treatment  Patient Details  Name: Stephanie Barnes MRN: [id number] Date of Birth: [birth year] Referring Provider: Dondra Spry, MD  Encounter Date: 12/19/2015      PT End of Session - 12/19/15 1026    Visit Number 12   Number of Visits 30   Date for PT Re-Evaluation 02/12/16   PT Start Time 1016   PT Stop Time 1119   PT Time Calculation (min) 63 min   Activity Tolerance Patient tolerated treatment well;Patient limited by pain   Behavior During Therapy [facility] for tasks assessed/performed      Past Medical History  Diagnosis Date  . Anxiety   . Arthritis   . Depression   . 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 Rotator cuff tear   . Medicare annual wellness visit, subsequent 10/02/2015  . Cancer ([place])   . Welcome to Medicare preventive visit 10/02/2015    Past Surgical History  Procedure Laterality Date  . Tonsillectomy    . Dental surgery      Root cleaning   . Refractive surgery Bilateral 2000    There were no vitals filed for this visit.  Visit Diagnosis:  Edema of upper extremity  Pain of right shoulder region  Muscle right arm weakness  Stiffness of right shoulder joint  Posture abnormality      Subjective Assessment - 12/19/15 1023    Subjective Continues to await compression garment (was supposed to come in on Monday but has not heard from DME provider). Reports increased pain after last treatment with numbness in hand when tried to sleep that night. Better yesterday but swelling is worse again today.   Currently in Pain? Yes   Pain Score --  5-6/10            [facility] PT Assessment - 12/19/15 1016    Assessment   Next MD Visit 12/21/15   Observation/Other Assessments   Focus on Therapeutic Outcomes (FOTO)  Shoulder - 0% (100% limitation); Predicted 52% (48% limitation)    ROM / Strength   AROM / PROM / Strength PROM   PROM   PROM Assessment Site Shoulder   Right/Left Shoulder Right   Right Shoulder Flexion 82 Degrees   Right Shoulder ABduction 71 Degrees   Right Shoulder Internal Rotation 49 Degrees  measured in 30 dg ABD   Right Shoulder External Rotation 40 Degrees  measured in 30 dg ABD          Today's Treatment  ROM check  Manual R Shoulder Grade 2-3 AP and inferior GH glides with mobs/stretch into ER, Flex, and ABD to tolerance  R Shoulder PROM flexion/abduction, ER/IR in scapular plane to patient tolerance R scapular mobs in L sidelying  TherEx  R Forearm pronation/supination AROM x10 with manual assist for full motion at end range (painful at wrist due to increased edema) R Elbow flexion/extension AROM 2x10 R PT assisted AAROM right shoulder flexion and ER/IR  R shoulder flexion towel slides on counter top 2x5 Prone gravity assisted shoulder flexion stretch 3x30" Prone R scapular retraction x10  Modalities Vasopnuematic compression to R shoulder - low pressure, lowest temp x10' Ice pack to right forearm x 10'         PT Short Term Goals - 08/22/15 1052    PT SHORT TERM GOAL #1   Title Indpendent with initial  HEP (09/05/15)   Status Achieved           PT Long Term Goals - 12/19/15 1125    PT LONG TERM GOAL #1   Title R Shoulder AROM WFL all planes without limit by pain by 02/12/16   Status On-going   PT LONG TERM GOAL #2   Title R Shoulder MMT 4/5 or better all planes by 02/12/16   Status On-going   PT LONG TERM GOAL #3   Title Pt able to return to all ADLs, chores, and daily activities without limitation by R shoulder pain, LOM, or weakness by 02/12/16   Status On-going           Long Term Clinic Goals - 12/13/15 1732    CC Long Term Goal  #1   Title Patient will be knowledgeable about self-management of right UE edema including where and how to obtain compression garments   Status Achieved             Plan - 12/19/15 1115    Clinical Impression Statement Patient reporting increased pain after last visit but somewhat better today. R UE edema more pornounced today with patient still awaiting compression garments. Able to tolerate increased P/AAROM today with measurements taken for MD note, but AAROM remains limited due to patient inability to grip with right hand. Continued education in neutral shoulder posture along with scapular activation during shoulder ROM.   PT Next Visit Plan Assess effect of compression garments, Edema management, Gentle GH & scapulothoracic mobs and PROM as tolerated.    Consulted and Agree with Plan of Care Patient        Problem List Patient Active Problem List   Diagnosis Date Noted  . Welcome to Medicare preventive visit 10/02/2015  . Cellulitis of face 09/23/2015  . Pain in joint of right shoulder 09/23/2015  . Cervical disc disorder with radiculopathy of cervical region 07/17/2015  . Colon polyp   tubular adenoma  03/2014 03/22/2014  . Hyperlipidemia 06/13/2013  . Abnormal EKG 06/13/2013  . Osteopenia 06/13/2013  . Insomnia 06/09/2013  . 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 DJD (degenerative joint disease) 12/02/2012  . GERD (gastroesophageal reflux disease) 12/02/2012  . Eczema 12/02/2012  . Tendinitis of hand 12/02/2012    Percival Spanish, PT,MPT 12/19/2015, 11:28 AM  [facility] [address]  [place], [place], [address] Phone: [phone]   Fax:  [phone]  Name: Stephanie Barnes MRN: [id number] Date of Birth: Jun 18, 1951

## 2015-12-21 ENCOUNTER — Ambulatory Visit: Payer: Medicare HMO | Admitting: Physical Therapy

## 2015-12-21 DIAGNOSIS — M6281 Muscle weakness (generalized): Secondary | ICD-10-CM

## 2015-12-21 DIAGNOSIS — R600 Localized edema: Secondary | ICD-10-CM

## 2015-12-21 DIAGNOSIS — R609 Edema, unspecified: Secondary | ICD-10-CM | POA: Diagnosis not present

## 2015-12-21 DIAGNOSIS — M25611 Stiffness of right shoulder, not elsewhere classified: Secondary | ICD-10-CM

## 2015-12-21 DIAGNOSIS — M25511 Pain in right shoulder: Secondary | ICD-10-CM

## 2015-12-21 DIAGNOSIS — R293 Abnormal posture: Secondary | ICD-10-CM

## 2015-12-21 NOTE — Therapy (Signed)
[facility] [address]  [place], [place], [address] Phone: [phone]   Fax:  [phone]  Physical Therapy Treatment  Patient Details  Name: Stephanie Barnes MRN: [id number] Date of Birth: 07/23/1951 Referring Provider: Dondra Spry, MD  Encounter Date: 12/21/2015      PT End of Session - 12/21/15 1031    Visit Number 13   Number of Visits 30   Date for PT Re-Evaluation 02/12/16   PT Start Time 48      Past Medical History  Diagnosis Date  . Anxiety   . Arthritis   . Depression   . 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 Rotator cuff tear   . Medicare annual wellness visit, subsequent 10/02/2015  . Cancer ([place])   . Welcome to Medicare preventive visit 10/02/2015    Past Surgical History  Procedure Laterality Date  . Tonsillectomy    . Dental surgery      Root cleaning   . Refractive surgery Bilateral 2000    There were no vitals filed for this visit.  Visit Diagnosis:  No diagnosis found.      Subjective Assessment - 12/21/15 1025    Subjective Patient reports she had to sleep in her recliner last night because she had not been sleeping well in the bed and needed a good night's sleep, however her arm is now more swollen due to this. Still awaiting compression garments.   Currently in Pain? Yes   Pain Score --  3-4/10   Pain Location Shoulder   Pain Orientation Right          Today's Treatment  Manual Gentle retrograde massage to R UE from fingers through hand, wrist, forearm and upper arm to shoulder to facilitate edema reduction to patient tolerance STM/MFR to proximal biceps and anterior deltoid R Shoulder Grade 2-3 AP and inferior GH glides with mobs/stretch into ER, Flex, and ABD to tolerance  R Shoulder PROM flexion/abduction, ER/IR in scapular plane to patient tolerance PROM Right fingers, hand, wrist  TherEx  B shoulder rolls & ScapularArms crossed chest AROM trunk  diagonals for AAROM scapula abduction/ adduction x15 ea R Forearm pronation/supination AROM x10 with manual assist for full motion at end range (painful at wrist due to increased edema) R Elbow flexion/extension AROM 2x10 R PT assisted AAROM right shoulder flexion and ER/IR   Modalities Vasopnuematic compression to R shoulder - low pressure, lowest temp x4' (set for 15', but stopped early due to ride service arrived early for pick-up) Ice pack to right forearm x 4'           PT Long Term Goals - 12/19/15 1125    PT LONG TERM GOAL #1   Title R Shoulder AROM WFL all planes without limit by pain by 02/12/16   Status On-going   PT LONG TERM GOAL #2   Title R Shoulder MMT 4/5 or better all planes by 02/12/16   Status On-going   PT LONG TERM GOAL #3   Title Pt able to return to all ADLs, chores, and daily activities without limitation by R shoulder pain, LOM, or weakness by 02/12/16   Status On-going           Long Term Clinic Goals - 12/13/15 1732    CC Long Term Goal  #1   Title Patient will be knowledgeable about self-management of right UE edema including where and how to obtain compression garments   Status Achieved  Problem List Patient Active Problem List   Diagnosis Date Noted  . Welcome to Medicare preventive visit 10/02/2015  . Cellulitis of face 09/23/2015  . Pain in joint of right shoulder 09/23/2015  . Cervical disc disorder with radiculopathy of cervical region 07/17/2015  . Colon polyp   tubular adenoma  03/2014 03/22/2014  . Hyperlipidemia 06/13/2013  . Abnormal EKG 06/13/2013  . Osteopenia 06/13/2013  . Insomnia 06/09/2013  . 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 DJD (degenerative joint disease) 12/02/2012  . GERD (gastroesophageal reflux disease) 12/02/2012  . Eczema 12/02/2012  . Tendinitis of hand 12/02/2012    Percival Spanish, PT, MPT 12/21/2015, 10:46 AM  [facility] [address]  [place], [place], [address] Phone: [phone]   Fax:  [phone]  Name: Stephanie Barnes MRN: [id number] Date of Birth: 1951/04/15

## 2015-12-24 ENCOUNTER — Ambulatory Visit: Payer: Medicare HMO | Admitting: Physical Therapy

## 2015-12-24 DIAGNOSIS — S46011D Strain of muscle(s) and tendon(s) of the rotator cuff of right shoulder, subsequent encounter: Secondary | ICD-10-CM | POA: Diagnosis not present

## 2015-12-24 DIAGNOSIS — R293 Abnormal posture: Secondary | ICD-10-CM

## 2015-12-24 DIAGNOSIS — M25511 Pain in right shoulder: Secondary | ICD-10-CM

## 2015-12-24 DIAGNOSIS — M6281 Muscle weakness (generalized): Secondary | ICD-10-CM

## 2015-12-24 DIAGNOSIS — M25611 Stiffness of right shoulder, not elsewhere classified: Secondary | ICD-10-CM

## 2015-12-24 DIAGNOSIS — R609 Edema, unspecified: Secondary | ICD-10-CM | POA: Diagnosis not present

## 2015-12-24 DIAGNOSIS — R600 Localized edema: Secondary | ICD-10-CM

## 2015-12-24 NOTE — Therapy (Signed)
[facility] [address]  [place], [place], [address] Phone: [phone]   Fax:  [phone]  Physical Therapy Treatment  Patient Details  Name: Stephanie Barnes MRN: [id number] Date of Birth: [birth year] Referring Provider: Dondra Spry, MD  Encounter Date: 12/24/2015      PT End of Session - 12/24/15 1032    Visit Number 14   Number of Visits 30   Date for PT Re-Evaluation 02/12/16   PT Start Time 1025   PT Stop Time 1127   PT Time Calculation (min) 62 min   Activity Tolerance Patient tolerated treatment well;Patient limited by pain   Behavior During Therapy [facility] for tasks assessed/performed      Past Medical History  Diagnosis Date  . Anxiety   . Arthritis   . Depression   . 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 Rotator cuff tear   . Medicare annual wellness visit, subsequent 10/02/2015  . Cancer ([place])   . Welcome to Medicare preventive visit 10/02/2015    Past Surgical History  Procedure Laterality Date  . Tonsillectomy    . Dental surgery      Root cleaning   . Refractive surgery Bilateral 2000    There were no vitals filed for this visit.  Visit Diagnosis:  Edema of upper extremity  Pain of right shoulder region  Muscle right arm weakness  Stiffness of right shoulder joint  Posture abnormality      Subjective Assessment - 12/24/15 1028    Subjective Patient reports her compression sleeve came in late Friday and she will be going to have them put on this afternoon. Reports attempting to sleep in bed but not sleeping well due pain and throbbing from pressure resulting from swelling moving up her arm. MD appt from last Friday rescheduled for Thursday, 12/27/15.   Currently in Pain? Yes   Pain Score --  3-4/10   Pain Location Shoulder   Pain Orientation Right   Pain Descriptors / Indicators Aching            [facility] PT Assessment - 12/24/15 1025    Assessment   Next MD Visit  12/27/15          Today's Treatment  Manual Gentle retrograde massage to R UE from fingers through hand, wrist, forearm and upper arm to shoulder to facilitate edema reduction to patient tolerance PROM Right fingers, hand, wrist, and forearm STM/MFR to proximal biceps and anterior/lateral deltoid, and teres minor R Shoulder Grade 2-3 AP and inferior GH glides with mobs/stretch into ER, Flex, and ABD to tolerance  R Shoulder PROM flexion/abduction, ER/IR in scapular plane to patient tolerance  TherEx  R Forearm pronation/supination AROM x10 with manual assist for full motion at end range (painful at wrist due to increased edema) R Elbow flexion/extension AROM x15 R PT assisted AAROM right shoulder flexion, ABD and ER/IR (remains unable to grasp cane/wand due to excessive edema in R arm and hand)  Modalities Vasopnuematic compression to R shoulder - low pressure, lowest temp x15' Ice pack to right forearm x 15'            PT Long Term Goals - 12/19/15 1125    PT LONG TERM GOAL #1   Title R Shoulder AROM WFL all planes without limit by pain by 02/12/16   Status On-going   PT LONG TERM GOAL #2   Title R Shoulder MMT 4/5 or better all planes by 02/12/16  Status On-going   PT LONG TERM GOAL #3   Title Pt able to return to all ADLs, chores, and daily activities without limitation by R shoulder pain, LOM, or weakness by 02/12/16   Status On-going           Long Term Clinic Goals - 12/13/15 1732    CC Long Term Goal  #1   Title Patient will be knowledgeable about self-management of right UE edema including where and how to obtain compression garments   Status Achieved            Plan - 12/24/15 1119    Clinical Impression Statement Patient reporting difficulty with sleeping in bed due to pain/pressure from edema in R arm, although shoulder pain seems to be improving. Tolerance for shoulder AA/PROM slowly improving but still hindered by limited tolerance for PT handling  of UE due to pain when arm gripped, especially near wrist or hand. Patient to pick-up her compression garments today and will assess impact of edema and pain at next visit.   PT Next Visit Plan MD note; Assess effect of compression garments, Edema management, Gentle GH & scapulothoracic mobs and AA/PROM as tolerated.    Consulted and Agree with Plan of Care Patient        Problem List Patient Active Problem List   Diagnosis Date Noted  . Welcome to Medicare preventive visit 10/02/2015  . Cellulitis of face 09/23/2015  . Pain in joint of right shoulder 09/23/2015  . Cervical disc disorder with radiculopathy of cervical region 07/17/2015  . Colon polyp   tubular adenoma  03/2014 03/22/2014  . Hyperlipidemia 06/13/2013  . Abnormal EKG 06/13/2013  . Osteopenia 06/13/2013  . Insomnia 06/09/2013  . 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 DJD (degenerative joint disease) 12/02/2012  . GERD (gastroesophageal reflux disease) 12/02/2012  . Eczema 12/02/2012  . Tendinitis of hand 12/02/2012    Percival Spanish, PT, MPT 12/24/2015, 11:43 AM  [facility] [address]  [place], [place], [address] Phone: [phone]   Fax:  [phone]  Name: Beaux Enerson MRN: [id number] Date of Birth: 27-Sep-1951

## 2015-12-26 ENCOUNTER — Emergency Department (HOSPITAL_COMMUNITY)
Admission: EM | Admit: 2015-12-26 | Discharge: 2015-12-26 | Disposition: A | Discharge status: Home / Self Care | Payer: Medicare HMO | Attending: Emergency Medicine | Admitting: Emergency Medicine

## 2015-12-26 ENCOUNTER — Emergency Department (HOSPITAL_BASED_OUTPATIENT_CLINIC_OR_DEPARTMENT_OTHER)
Admit: 2015-12-26 | Discharge: 2015-12-26 | Disposition: A | Discharge status: Home / Self Care | Payer: Medicare HMO | Attending: Emergency Medicine | Admitting: Emergency Medicine

## 2015-12-26 ENCOUNTER — Encounter (HOSPITAL_COMMUNITY): Payer: Self-pay

## 2015-12-26 ENCOUNTER — Ambulatory Visit: Payer: Medicare HMO | Admitting: Physical Therapy

## 2015-12-26 DIAGNOSIS — G90511 Complex regional pain syndrome I of right upper limb: Secondary | ICD-10-CM | POA: Insufficient documentation

## 2015-12-26 DIAGNOSIS — Z9889 Other specified postprocedural states: Secondary | ICD-10-CM | POA: Insufficient documentation

## 2015-12-26 DIAGNOSIS — M7989 Other specified soft tissue disorders: Secondary | ICD-10-CM

## 2015-12-26 DIAGNOSIS — Z791 Long term (current) use of non-steroidal anti-inflammatories (NSAID): Secondary | ICD-10-CM | POA: Diagnosis not present

## 2015-12-26 DIAGNOSIS — Z859 Personal history of malignant neoplasm, unspecified: Secondary | ICD-10-CM | POA: Diagnosis not present

## 2015-12-26 DIAGNOSIS — R69 Illness, unspecified: Secondary | ICD-10-CM | POA: Diagnosis not present

## 2015-12-26 DIAGNOSIS — M1990 Unspecified osteoarthritis, unspecified site: Secondary | ICD-10-CM | POA: Diagnosis not present

## 2015-12-26 DIAGNOSIS — Z79899 Other long term (current) drug therapy: Secondary | ICD-10-CM | POA: Insufficient documentation

## 2015-12-26 DIAGNOSIS — F418 Other specified anxiety disorders: Secondary | ICD-10-CM | POA: Insufficient documentation

## 2015-12-26 DIAGNOSIS — M79601 Pain in right arm: Secondary | ICD-10-CM | POA: Diagnosis present

## 2015-12-26 LAB — CBC WITH DIFFERENTIAL/PLATELET
BASOS ABS: 0 10*3/uL (ref 0.0–0.1)
BASOS PCT: 1 %
EOS ABS: 0.1 10*3/uL (ref 0.0–0.7)
EOS PCT: 2 %
HCT: 35.2 % — ABNORMAL LOW (ref 36.0–46.0)
Hemoglobin: 11.7 g/dL — ABNORMAL LOW (ref 12.0–15.0)
Lymphocytes Relative: 45 %
Lymphs Abs: 2.2 10*3/uL (ref 0.7–4.0)
MCH: 30 pg (ref 26.0–34.0)
MCHC: 33.2 g/dL (ref 30.0–36.0)
MCV: 90.3 fL (ref 78.0–100.0)
Monocytes Absolute: 0.4 10*3/uL (ref 0.1–1.0)
Monocytes Relative: 8 %
Neutro Abs: 2.2 10*3/uL (ref 1.7–7.7)
Neutrophils Relative %: 44 %
PLATELETS: 207 10*3/uL (ref 150–400)
RBC: 3.9 MIL/uL (ref 3.87–5.11)
RDW: 12.5 % (ref 11.5–15.5)
WBC: 5 10*3/uL (ref 4.0–10.5)

## 2015-12-26 LAB — BASIC METABOLIC PANEL
ANION GAP: 8 (ref 5–15)
BUN: 21 mg/dL — ABNORMAL HIGH (ref 6–20)
CO2: 28 mmol/L (ref 22–32)
Calcium: 9.9 mg/dL (ref 8.9–10.3)
Chloride: 104 mmol/L (ref 101–111)
Creatinine, Ser: 0.76 mg/dL (ref 0.44–1.00)
GFR calc Af Amer: >60 mL/min (ref >60)
Glucose, Bld: 102 mg/dL — ABNORMAL HIGH (ref 65–99)
POTASSIUM: 4.1 mmol/L (ref 3.5–5.1)
SODIUM: 140 mmol/L (ref 135–145)

## 2015-12-26 MED ORDER — IBUPROFEN 200 MG PO TABS
600.0000 mg | ORAL_TABLET | Freq: Once | ORAL | Status: AC
  Administered 2015-12-26: 600 mg via ORAL
  Filled 2015-12-26: qty 3

## 2015-12-26 MED ORDER — OXYCODONE-ACETAMINOPHEN 5-325 MG PO TABS: ORAL_TABLET | ORAL | Status: DC

## 2015-12-26 MED ORDER — OXYCODONE HCL 5 MG PO TABS: 5.0000 mg | ORAL_TABLET | ORAL | Status: DC | PRN

## 2015-12-26 MED ORDER — OXYCODONE HCL 5 MG PO TABS
10.0000 mg | ORAL_TABLET | Freq: Once | ORAL | Status: AC
  Administered 2015-12-26: 10 mg via ORAL
  Filled 2015-12-26: qty 2

## 2015-12-26 NOTE — ED Provider Notes (Signed)
CSN: [id number]     Arrival date & time 12/26/15  1042 History   First MD Initiated Contact with Patient 12/26/15 1510     Chief Complaint  Patient presents with  . Arm Pain     (Consider location/radiation/quality/duration/timing/severity/associated sxs/prior Treatment) HPI  Blood pressure 146/85, pulse 78, temperature 98.2 F (36.8 C), temperature source Oral, resp. rate 20, SpO2 98 %.  Stephanie Barnes is a 65 y.o. female resenting to the ED with the encouragement of her orthopedist Dr. Clovis Riley. Patient is status post complicated rotator cuff repair in December 2016. She's had increasing edema to the right upper extremity over the course of the last month. Patient states her pain is difficult to control on by mouth Dilaudid at home. She is having issues with being woken from sleep by pain, states she is elevating the extremity during the night and fluid buildup in the deltoid and wakes her from sleep. Patient lives alone and has also having issues completing her ADLs. Patient is doing physical therapy. It is been a steady decline, no acute changes in her status. She is right-hand dominant.  Past Medical History  Diagnosis Date  . Anxiety   . Arthritis   . Depression   . 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 Rotator cuff tear   . Medicare annual wellness visit, subsequent 10/02/2015  . Cancer ([place])   . Welcome to Medicare preventive visit 10/02/2015   Past Surgical History  Procedure Laterality Date  . Tonsillectomy    . Dental surgery      Root cleaning   . Refractive surgery Bilateral 2000  . Rotator cuff repair     Family History  Problem Relation Age of Onset  . Heart disease Mother   . Arthritis Mother   . Heart disease Father   . COPD Father   . Mental illness Brother     bipolar  . Hypertension Brother   . Suicidality Brother   . Alcohol abuse Brother   . Heart disease Maternal Aunt   . Heart disease Maternal Uncle   . Heart disease Paternal Aunt   . COPD  Paternal 29   . Heart disease Paternal Uncle   . Heart disease Maternal Grandmother   . Heart disease Maternal Grandfather   . Heart disease Paternal Grandmother   . Heart disease Paternal Grandfather    Social History  Substance Use Topics  . Smoking status: Never Smoker   . Smokeless tobacco: Never Used  . Alcohol Use: 0.6 oz/week    1 Glasses of wine per week     Comment: per month   OB History    Gravida Para Term Preterm AB TAB SAB Ectopic Multiple Living   2    2          Review of Systems  10 systems reviewed and found to be negative, except as noted in the HPI.   Allergies  Aspirin; Celebrex; Doxycycline; Prednisone; Celexa; Fosamax; Clindamycin/lincomycin; and Other  Home Medications   Prior to Admission medications   Medication Sig Start Date End Date Taking? Authorizing Provider  buPROPion [facility]) 75 MG tablet 1/4 tablet daily 07/09/15   Mosie Lukes, MD  Calcium Carbonate-Vit D-Min (CALCIUM 1200 PO) Take 1 tablet by mouth 2 (two) times daily.    Historical Provider, MD  cetirizine (ZYRTEC) 10 MG tablet Take 10 mg by mouth daily as needed.     Historical Provider, MD  clonazePAM (KLONOPIN) 1 MG tablet TAKE ONE-HALF TO ONE TABLET  BY MOUTH TWICE DAILY AS NEEDED FOR ANXIETY 11/23/15   Mosie Lukes, MD  HYDROmorphone (DILAUDID) 2 MG tablet Take 6 mg by mouth every 6 (six) hours as needed for severe pain.    Historical Provider, MD  ibuprofen (ADVIL,MOTRIN) 800 MG tablet Take 800 mg by mouth 3 (three) times daily.    Historical Provider, MD  methocarbamol (ROBAXIN) 500 MG tablet Take 500 mg by mouth every 6 (six) hours as needed for muscle spasms.    Historical Provider, MD  mometasone (ELOCON) 0.1 % ointment Apply topically daily. As needed 03/15/15   Mosie Lukes, MD  Omega-3 Fatty Acids (FISH OIL) 1000 MG CAPS Take by mouth 3 (three) times daily.    Historical Provider, MD  pantoprazole (PROTONIX) 40 MG tablet Take 1 tablet (40 mg total) by mouth daily.  03/15/15   Mosie Lukes, MD  potassium chloride SA (K-DUR,KLOR-CON) 20 MEQ tablet Take 2 tablets for 3 days, then 1 tablet once daily. 12/13/15   Mosie Lukes, MD  Probiotic Product (PROBIOTIC DAILY PO) Take by mouth.    Historical Provider, MD  traMADol (ULTRAM) 50 MG tablet Take 1 tablet (50 mg total) by mouth every 8 (eight) hours as needed. Patient not taking: Reported on 11/20/2015 10/15/15   Mosie Lukes, MD   BP 146/85 mmHg  Pulse 78  Temp(Src) 98.2 F (36.8 C) (Oral)  Resp 20  SpO2 98% Physical Exam  Constitutional: She is oriented to person, place, and time. She appears well-developed and well-nourished. No distress.  HENT:  Head: Normocephalic and atraumatic.  Mouth/Throat: Oropharynx is clear and moist.  Eyes: Conjunctivae and EOM are normal. Pupils are equal, round, and reactive to light.  Neck: Normal range of motion.  Cardiovascular: Normal rate, regular rhythm and intact distal pulses.   Pulmonary/Chest: Effort normal and breath sounds normal.  Abdominal: Soft. There is no tenderness.  Musculoskeletal: Normal range of motion. She exhibits tenderness.  Significant edema to the right upper extremity, radial pulses 2+. She is diffusely tender to palpation especially at the biceps and rotator cuff musculature. Good range of motion to fingers and wrists. Cap refill is brisk 5  Neurological: She is alert and oriented to person, place, and time.  Skin: She is not diaphoretic.  Psychiatric: She has a normal mood and affect.  Nursing note and vitals reviewed.   ED Course  Procedures (including critical care time) Labs Review Labs Reviewed  CBC WITH DIFFERENTIAL/PLATELET - Abnormal; Notable for the following:    Hemoglobin 11.7 (*)    HCT 35.2 (*)    All other components within normal limits  BASIC METABOLIC PANEL - Abnormal; Notable for the following:    Glucose, Bld 102 (*)    BUN 21 (*)    All other components within normal limits    Imaging Review No results  found. I have personally reviewed and evaluated these images and lab results as part of my medical decision-making.   EKG Interpretation None      MDM   Final diagnoses:  RSD upper limb, right    Filed Vitals:   12/26/15 1447 12/26/15 1512 12/26/15 1721 12/26/15 1756  BP: 180/97 146/85 162/101   Pulse:  78 83   Temp:  98.2 F (36.8 C)    TempSrc:  Oral Other (Comment)   Resp:  20 18   Weight:    56.813 kg  SpO2:  98% 100%     Medications  oxyCODONE (Oxy IR/ROXICODONE) immediate  release tablet 10 mg (10 mg Oral Given 12/26/15 1851)  ibuprofen (ADVIL,MOTRIN) tablet 600 mg (600 mg Oral Given 12/26/15 1851)    Jennavie Salyards is 65 y.o. female presenting with indolent onset of worsening right upper extremity pain and swelling status post complicated rotator cuff repair 2 months ago. Discussed case with pedis Dr. Tonita Cong who has been trying to get her into a residential rehabilitation facility. States the pain is so severe she is unable to don the edema gloves. Discussed with case management who states the patient does not meet criteria for direct admission from the ED, will arrange for home health nurse and social work to try to set up a short stay at rehabilitation facility. Patient had negative upper extremity vascular ultrasound 2 weeks ago, will recheck. She is hesitant to take her pain medications at home because she does not want to become addicted. Reassured patient that addiction is very unlikely. Patient given prescription for Roxicodone for breakthrough pain.  This is a shared visit with the attending physician who personally evaluated the patient and agrees with the care plan.   Evaluation does not show pathology that would require ongoing emergent intervention or inpatient treatment. Pt is hemodynamically stable and mentating appropriately. Discussed findings and plan with patient/guardian, who agrees with care plan. All questions answered. Return precautions discussed and  outpatient follow up given.   Discharge Medication List as of 12/26/2015  9:11 PM    START taking these medications   Details  oxyCODONE-acetaminophen (PERCOCET/ROXICET) 5-325 MG tablet Take 1 tablet every 2-3 hours as needed for breakthrough pain., Print           Monico Blitz, PA-C 12/26/15 [place], MD 12/27/15 [phone]

## 2015-12-26 NOTE — Discharge Instructions (Signed)
Take your oral Dilaudid every 6 hours for pain control, you can take Percocet every 2 hours as needed for breakthrough pain.   The social worker and nurse will be in contact with you to establish care and try to arrange for a residential rehabilitation facility treatment.  Please follow with your primary care doctor in the next 2 days for a check-up. They must obtain records for further management.   Do not hesitate to return to the Emergency Department for any new, worsening or concerning symptoms.    Complex Regional Pain Syndrome Complex regional pain syndrome (CRPS) is a nerve disorder that causes long-lasting (chronic) pain, usually in a hand, arm, leg, or foot. CRPS usually follows an injury or trauma, such as a fracture or sprain. There are two types of CRPS:  Type 1. This type occurs after an injury or trauma with no known damage to a nerve.  Type 2. This type occurs after injury or trauma damages a nerve. There are three stages of the condition:  Stage 1. This stage, called the acute stage, may last for three months.  Stage 2. This stage, called the dystrophic stage, may last for three to 12 months.  Stage 3. This stage, called the atrophic stage, may start after one year. CRPS ranges from mild to severe. For most people CRPS is mild and recovery happens over time. For others, CRPS lasts a very long time and is debilitating. CAUSES The exact cause of CRPS is not known. RISK FACTORS You may be at increased risk if:  You are a woman.  You are approximately 65 years of age.  You have any of the following:  A family history of CRPS.  An injury or surgery.  An infection.  Cancer.  Neck problems.  A stroke.  A heart attack.  Asthma. SIGNS AND SYMPTOMS Signs and symptoms in the affected limb are different for each stage. Signs and symptoms of stage 1 include:  Burning pain.  A pins and needles sensation.  Extremely sensitive skin.  Swelling.  Joint  stiffness.  Warmth and redness.  Excessive sweating.  Hair and nail growth that is faster than normal. Signs and symptoms of stage 2 include:  Spreading of pain to the whole limb.  Increased skin sensitivity.  Increased swelling and stiffness.  Coolness of the skin.  Blue discoloration of skin.  Loss of skin wrinkles.  Brittle fingernails. Signs and symptoms of stage 3 include:   Pain that spreads to other areas of the body but becomes less severe.  More stiffness, leading to loss of motion.  Skin that is pale, dry, shiny, and tightly stretched. DIAGNOSIS There is no test to diagnose CRPS. Your health care provider will make a diagnosis based on your signs and symptoms and a physical exam. The exam may include tests to rule out other possible causes of your symptoms. Sometimes imaging tests are done, such as an MRI or bone scan. These tests check for bone changes that might indicate CRPS.  TREATMENT Early treatment may prevent CRPS from advancing past stage 1. There is no one treatment that works for everyone. Treatment options may include:  Medicines, such as:  Nonsteroidal-anti-inflammatory drugs (NSAIDS).  Steroids.  Blood pressure drugs.  Antidepressants.  Anti-seizure drugs.  Pain relievers.  Exercise.  Occupational and physical therapy.  Biofeedback.  Mental health counseling.  Numbing injections.  Spinal surgery to implant a spinal cord stimulator or a pain pump. HOME CARE INSTRUCTIONS  Take medicines only as directed  by your health care provider.  Follow an exercise program as directed by your health care provider.  Maintain a healthy weight.  Keep all follow-up visits as directed by your health care provider. This is important. SEEK MEDICAL CARE IF:  Your symptoms change.  Your symptoms get worse.  You develop anxiety or depression.   This information is not intended to replace advice given to you by your health care provider. Make  sure you discuss any questions you have with your health care provider.   Document Released: 10/17/2002 Document Revised: 11/17/2014 Document Reviewed: 07/24/2014 Elsevier Interactive Patient Education [employer].

## 2015-12-26 NOTE — Care Management Note (Signed)
Case Management Note  Patient Details  Name: Stephanie Barnes MRN: [id number] Date of Birth: 1951/04/15  Subjective/Objective:  Patient presents to Ed with pain and swelling to right arm post complicated rotator cuff repair in December of 2016.                      Action/Plan:  EDCM spoke to patient at bedside.  Patient reports she lives on her own.  She reports she has neighbors who check in on her and take her to doctor's appointments.  She reports she also uses [facility] to get to her appointments, including her out patient PT appointments she receives three times a week.  Patient reports she would like to go to a rehab facility for a short period of time because of the pain she has in her arm, especially at night which causes sleeplessness.  Per EDPA, patient's orthopedist is attempting to assist her to get into a facility.  [facility] discussed home health services with patient.  Patient reports, "I don't need PT because I have that already and I don't need someone to wash and dress me because I can do that myself.  I need someone at night time."  [facility] explained private duty services and explained it would be an out of pocket expense.  Patient reports she doesn't have the funds to afford this.  EDCM explained with home health services a social worker may be added to assist with rehab placement from home.  Patient is agreeable to RN and SW at home.  Patient has chosen [facility] for these services.  Patient reports she is supposed to wear a compression garment to her right arm but it is so swollen they are unable to get one on her.  Discussed patient with EDP and EDPA.  Orders placed for home health RN and SW.  [facility] faxed referral to [facility] with confirmation of receipt.  No further EDCM needs at this time.   Expected Discharge Date:   12/26/2015               Expected Discharge Plan:  [place]  In-House Referral:     Discharge planning Services  CM Consult  Post Acute Care Choice:   Home Health Choice offered to:  Patient  DME Arranged:    DME Agency:     HH Arranged:  RN, Social Work [employer] Agency:  [facility]  Status of Service:  Completed, signed off  Medicare Important Message Given:    Date Medicare IM Given:    Medicare IM give by:    Date Additional Medicare IM Given:    Additional Medicare Important Message give by:     If discussed at [place] of Stay Meetings, dates discussed:    Additional CommentsLivia Snellen, RN 12/26/2015, 6:03 PM

## 2015-12-26 NOTE — ED Notes (Signed)
Attempted x 2 to initiate IV access & draw labs without success.

## 2015-12-26 NOTE — ED Notes (Signed)
Pt states that she is in a lot of pain. She doesn't take BP meds. BP is normally in the 110 range.

## 2015-12-26 NOTE — ED Notes (Signed)
Pt had surgery on rt shoulder on 2/12.  Pt has had increased pain and swelling.  Notified surgeon and told to come here.  Pt states surgery wants ED to call him.  Pt is not sleeping.

## 2015-12-26 NOTE — Progress Notes (Signed)
VASCULAR LAB PRELIMINARY  PRELIMINARY  PRELIMINARY  PRELIMINARY  Right upper extremity venous duplex completed.    Preliminary report:  Right:  No evidence of DVT or superficial thrombosis.    Sulaiman Imbert, RVT 12/26/2015, 8:16 PM

## 2015-12-26 NOTE — ED Notes (Signed)
Pt is speaking with care management, will ask weight when she is finished.

## 2015-12-26 NOTE — ED Notes (Signed)
Care Management @ bedside

## 2015-12-27 ENCOUNTER — Encounter: Payer: Self-pay | Admitting: Family Medicine

## 2015-12-28 ENCOUNTER — Other Ambulatory Visit: Payer: Self-pay | Admitting: Family Medicine

## 2015-12-28 ENCOUNTER — Ambulatory Visit: Payer: Medicare HMO | Admitting: Physical Therapy

## 2015-12-28 DIAGNOSIS — M6281 Muscle weakness (generalized): Secondary | ICD-10-CM

## 2015-12-28 DIAGNOSIS — R600 Localized edema: Secondary | ICD-10-CM

## 2015-12-28 DIAGNOSIS — R293 Abnormal posture: Secondary | ICD-10-CM

## 2015-12-28 DIAGNOSIS — R609 Edema, unspecified: Secondary | ICD-10-CM | POA: Diagnosis not present

## 2015-12-28 DIAGNOSIS — M79601 Pain in right arm: Secondary | ICD-10-CM | POA: Diagnosis not present

## 2015-12-28 DIAGNOSIS — M25611 Stiffness of right shoulder, not elsewhere classified: Secondary | ICD-10-CM

## 2015-12-28 DIAGNOSIS — M25511 Pain in right shoulder: Secondary | ICD-10-CM

## 2015-12-28 DIAGNOSIS — M7989 Other specified soft tissue disorders: Secondary | ICD-10-CM | POA: Diagnosis not present

## 2015-12-28 NOTE — Therapy (Signed)
[facility] [address]  [place], [place], [address] Phone: [phone]   Fax:  [phone]  Physical Therapy Treatment  Patient Details  Name: Stephanie Barnes MRN: [id number] Date of Birth: 03-18-1951 Referring Provider: Dondra Spry, MD  Encounter Date: 12/28/2015      PT End of Session - 12/28/15 1103    Visit Number 15   Number of Visits 30   Date for PT Re-Evaluation 02/12/16   PT Start Time 1018   PT Stop Time 1118   PT Time Calculation (min) 60 min   Activity Tolerance Patient tolerated treatment well;Patient limited by pain   Behavior During Therapy [facility] for tasks assessed/performed      Past Medical History  Diagnosis Date  . Anxiety   . Arthritis   . Depression   . 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 Rotator cuff tear   . Medicare annual wellness visit, subsequent 10/02/2015  . Cancer ([place])   . Welcome to Medicare preventive visit 10/02/2015    Past Surgical History  Procedure Laterality Date  . Tonsillectomy    . Dental surgery      Root cleaning   . Refractive surgery Bilateral 2000  . Rotator cuff repair      There were no vitals filed for this visit.  Visit Diagnosis:  Edema of upper extremity  Pain of right shoulder region  Muscle right arm weakness  Stiffness of right shoulder joint  Posture abnormality      Subjective Assessment - 12/28/15 1023    Subjective Patient does not have compression garments due to inablility to don garments independently when she went to pick them up on Monday. Went to ER on Wed 2/15 due to concerns regarding pain and inability to sleep. Re-checked for DVT with results negative. 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 Started on Oxycodone for pain at night and reports sleeping 2-3 hrs at a time and able to get back to sleep better so feeling more rested. Referred to SW to see if assistance available at home but aide would only be  able to come every 3-4 days which would not help with her issues with donning compression garments. Pt is considering short term SNF stay if insurance will cover this and plans to address this with her PCP at appt on Monday for f/u from elevated BP at ER visit.   Pertinent History 12/26/15 - Repeat doppler US negative for DVT   Currently in Pain? Yes   Pain Score 3    Pain Location Shoulder   Pain Orientation Right   Pain Descriptors / Indicators Aching         Today's Treatment  Manual Gentle retrograde massage to R UE from fingers through hand, wrist, forearm and upper arm to shoulder to facilitate edema reduction to patient tolerance PROM Right fingers, hand, wrist, and forearm STM/MFR to proximal biceps and anterior/lateral deltoid, and teres minor with TPR as needed R Shoulder Grade 2-3 AP and inferior GH glides with mobs/stretch into ER, Flex, and ABD to tolerance  R Shoulder PROM flexion/abduction, ER/IR in scapular plane to patient tolerance  TherEx  R Forearm pronation/supination AROM x10 with manual assist for full motion at end range (painful at wrist due to increased edema) R Elbow flexion/extension AROM x15 Pt instructed in R AAROM shoulder flexion using L hand to aide R UE and R AAROM shoulder ABD with hand propped on end of  cane  Pt remains unable to perform self-AAROM for ER/IR due to unable to grasp cane/wand due to excessive edema in R arm and hand  Modalities Vasopnuematic compression to R shoulder - low pressure, lowest temp x15' Ice pack to right forearm x 15'          PT Short Term Goals - 12/21/15 1255    PT SHORT TERM GOAL #1   Title n/a           PT Long Term Goals - 12/28/15 1323    PT LONG TERM GOAL #1   Title R Shoulder AROM WFL all planes without limit by pain by 02/12/16   Status On-going   PT LONG TERM GOAL #2   Title R Shoulder MMT 4/5 or better all planes by 02/12/16   Status On-going   PT LONG TERM GOAL #3   Title Pt able to return  to all ADLs, chores, and daily activities without limitation by R shoulder pain, LOM, or weakness by 02/12/16   Status On-going           Long Term Clinic Goals - 12/13/15 1732    CC Long Term Goal  #1   Title Patient will be knowledgeable about self-management of right UE edema including where and how to obtain compression garments   Status Achieved            Plan - 12/28/15 1308    Clinical Impression Statement Patient was unable to obtain compression garments on Monday as planned due to inability to don garments independently when she went to pick them up. Pt also seen in ED since last PT visit for worsening pain and inabilty to sleep. ED MD believes that R UE pain and swelling may be due to CRPS and prescribed oxycodone for pain management. Pt reporting improving sleep with new prescription for oxycodone and feeling less stressed/anxious which allowed for improved therapy tolerance today with left shoulder PROM. 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 Given improved tolerance for gripping of R hand/wrist, instructed patient in self-AAROM for shoulder flexion using L hand to grip R wrist/hand and self-AAROM shoulder ABD with R hand propped on end of cane. AAROM for IR and ER remains difficult due to lack of functional grip in R hand. Given inability to wear compression garments for edema management due to donning issues, may consider trial of kinesiotaping for edema management at next visit.   PT Next Visit Plan Edema management, Gentle GH & scapulothoracic mobs and AA/PROM as tolerated, Kinesiotaping for edema management?   Consulted and Agree with Plan of Care Patient        Problem List Patient Active Problem List   Diagnosis Date Noted  . Welcome to Medicare preventive visit 10/02/2015  . Cellulitis of face 09/23/2015  . Pain in joint of  right shoulder 09/23/2015  . Cervical disc disorder with radiculopathy of cervical region 07/17/2015  . Colon polyp   tubular adenoma  03/2014 03/22/2014  . Hyperlipidemia 06/13/2013  . Abnormal EKG 06/13/2013  . Osteopenia 06/13/2013  . Insomnia 06/09/2013  . 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 DJD (degenerative joint disease) 12/02/2012  . GERD (gastroesophageal reflux disease) 12/02/2012  . Eczema 12/02/2012  . Tendinitis of hand 12/02/2012    Percival Spanish, PT, MPT  12/28/2015, 1:33 PM  [facility]  [facility] [address]  [place], [place], [address] Phone: [phone]   Fax:  [phone]  Name: Danisa Tragesser MRN: [id number] Date of Birth: 1951/08/27

## 2015-12-28 NOTE — Telephone Encounter (Signed)
Pt came into the office I informed her that she had a refill left .

## 2015-12-28 NOTE — Telephone Encounter (Signed)
Called left the patient a detailed msg. To confirm she did receive message regarding her refill.

## 2015-12-28 NOTE — Telephone Encounter (Signed)
Called back to inform last refill was done on 11/23/2015 #30 with 1 refill and should have one refill left.   Called left msg. To call back

## 2015-12-28 NOTE — Telephone Encounter (Signed)
°  Relation to [id number] Call back number:[phone] Pharmacy:[email or web address]  Reason for call:  Pt is needing rx clonazePAM (KLONOPIN) 1 MG tablet, pt states she only has enough through Saturday, and her pharmacy not open on sundays.

## 2015-12-31 ENCOUNTER — Ambulatory Visit: Payer: Medicare HMO | Admitting: Physical Therapy

## 2015-12-31 DIAGNOSIS — R293 Abnormal posture: Secondary | ICD-10-CM

## 2015-12-31 DIAGNOSIS — R600 Localized edema: Secondary | ICD-10-CM

## 2015-12-31 DIAGNOSIS — M25611 Stiffness of right shoulder, not elsewhere classified: Secondary | ICD-10-CM

## 2015-12-31 DIAGNOSIS — M6281 Muscle weakness (generalized): Secondary | ICD-10-CM

## 2015-12-31 DIAGNOSIS — M25511 Pain in right shoulder: Secondary | ICD-10-CM

## 2015-12-31 DIAGNOSIS — R609 Edema, unspecified: Secondary | ICD-10-CM | POA: Diagnosis not present

## 2015-12-31 NOTE — Therapy (Signed)
[facility] [address]  [place], [place], [address] Phone: [phone]   Fax:  [phone]  Physical Therapy Treatment  Patient Details  Name: Stephanie Barnes MRN: [id number] Date of Birth: 1951/01/29 Referring Provider: Dondra Spry, MD  Encounter Date: 12/31/2015      PT End of Session - 12/31/15 1057    Visit Number 16   Number of Visits 30   Date for PT Re-Evaluation 02/12/16   PT Start Time 7517   PT Stop Time 1119   PT Time Calculation (min) 64 min   Activity Tolerance Patient tolerated treatment well;Patient limited by pain   Behavior During Therapy [facility] for tasks assessed/performed      Past Medical History  Diagnosis Date  . Anxiety   . Arthritis   . Depression   . 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 Rotator cuff tear   . Medicare annual wellness visit, subsequent 10/02/2015  . Cancer ([place])   . Welcome to Medicare preventive visit 10/02/2015    Past Surgical History  Procedure Laterality Date  . Tonsillectomy    . Dental surgery      Root cleaning   . Refractive surgery Bilateral 2000  . Rotator cuff repair      There were no vitals filed for this visit.  Visit Diagnosis:  Edema of upper extremity  Pain of right shoulder region  Muscle right arm weakness  Stiffness of right shoulder joint  Posture abnormality      Subjective Assessment - 12/31/15 1021    Subjective Patient referred by MD to Ann Lions, OT/L, CHT at [facility] who specializes in CRPS. Patient reports she will be transitioning her rehab services to his clinic so she will be able to receive both her PT & OT services at the same clinic.             [facility] PT Assessment - 12/31/15 1015    Observation/Other Assessments   Focus on Therapeutic Outcomes (FOTO)  Shoulder - 7% (93% limitation)   PROM   Overall PROM Comments AAROM   PROM Assessment Site Shoulder   Right/Left Shoulder Right   Right Shoulder Flexion 96 Degrees   Right Shoulder ABduction 80 Degrees   Right Shoulder Internal Rotation 56 Degrees  measured in 30dg ABD   Right Shoulder External Rotation 33 Degrees  measured in 30dg ABD          Today's Treatment  ROM check  Manual Gentle retrograde massage to R UE from fingers through hand, wrist, forearm and upper arm to shoulder to facilitate edema reduction to patient tolerance PROM Right fingers, hand, wrist, and forearm STM/MFR to proximal biceps and anterior/lateral deltoid, and teres minor  R Shoulder Grade 2-3 AP and inferior GH glides with mobs/stretch into ER, Flex, and ABD to tolerance  R Shoulder PROM flexion/abduction, ER/IR in scapular plane to patient tolerance  TherEx  R Forearm pronation/supination AROM x10 with manual assist for full motion at end range (still painful at wrist due to OA and increased edema) R Elbow flexion/extension AROM x15 R AAROM shoulder flexion using L hand grasping R wrist  R AAROM shoulder ER/IR with L hand guiding movement  Modalities Vasopnuematic compression to R shoulder - low pressure, lowest temp x15' Ice pack to right forearm x 15'           PT Long Term Goals - 12/31/15 1225    PT LONG TERM GOAL #1  Title R Shoulder AROM WFL all planes without limit by pain by 02/12/16   Status Not Met   PT LONG TERM GOAL #2   Title R Shoulder MMT 4/5 or better all planes by 02/12/16   Status Not Met   PT LONG TERM GOAL #3   Title Pt able to return to all ADLs, chores, and daily activities without limitation by R shoulder pain, LOM, or weakness by 02/12/16   Status Not Met           Long Term Clinic Goals - 12/13/15 1732    CC Long Term Goal  #1   Title Patient will be knowledgeable about self-management of right UE edema including where and how to obtain compression garments   Status Achieved            Plan - 01/14/16 1218    Clinical Impression Statement Patient referred to OT specializing in  CRPS and requesting to transition all rehab services including PT to [facility]. Patient noting continued fluctuations in sleep patterns due to pain but overall improved since starting oxycodone at night last week. Patient also noting increasing comfort being out of sling but reports still feel need for support at times due excess weight of arm from edema. Improving pain mangement allowing for slowly improving R shoulder ROM with AAROM flexion to 96, abduction to 80 and IR to 56, although ER tighter today at 33 degrees. Goals not met due to premature discharge secondary to unplanned transition of services to another clinic.   PT Next Visit Plan Discharge   Consulted and Agree with Plan of Care Patient          G-Codes - 01/14/16 1823    Functional Assessment Tool Used Shoulder - 7% (93% limitation)   Functional Limitation Carrying, moving and handling objects   Carrying, Moving and Handling Objects Current Status [phone]) At least 80 percent but less than 100 percent impaired, limited or restricted   Carrying, Moving and Handling Objects Goal Status ([id number]) At least 40 percent but less than 60 percent impaired, limited or restricted   Carrying, Moving and Handling Objects Discharge Status [phone]) At least 80 percent but less than 100 percent impaired, limited or restricted      Problem List Patient Active Problem List   Diagnosis Date Noted  . Welcome to Medicare preventive visit 10/02/2015  . Cellulitis of face 09/23/2015  . Pain in joint of right shoulder 09/23/2015  . Cervical disc disorder with radiculopathy of cervical region 07/17/2015  . Colon polyp   tubular adenoma  03/2014 03/22/2014  . Hyperlipidemia 06/13/2013  . Abnormal EKG 06/13/2013  . Osteopenia 06/13/2013  . Insomnia 06/09/2013  . 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 DJD (degenerative joint disease) 12/02/2012  . GERD (gastroesophageal reflux disease) 12/02/2012  . Eczema 12/02/2012  . Tendinitis of hand  12/02/2012    Percival Spanish, PT, MPT 2016/01/14, 6:24 PM  [facility] [address]  [place], [place], [address] Phone: [phone]   Fax:  [phone]  Name: Stephanie Barnes MRN: [id number] Date of Birth: 1951/01/28   PHYSICAL THERAPY DISCHARGE SUMMARY  Visits from Start of Care: 16  Current functional level related to goals / functional outcomes:   Patient referred to OT specializing in CRPS and requesting to transition all rehab services including PT to [facility]. Patient noting continued fluctuations in sleep patterns due to pain but overall improved since starting oxycodone at night  last week. Patient also noting increasing comfort being out of sling but reports still feel need for support at times due excess weight of arm from edema. Improving pain mangement allowing for slowly improving R shoulder ROM with AAROM flexion to 96, abduction to 80 and IR to 56, although ER tighter today at 33 degrees. Goals not met due to premature discharge secondary to unplanned transition of services to another clinic.   Remaining deficits:  Extensive pain/edema in R UE from CRPS, Lack of functional ROM and strength in R UE   Education / Equipment:  HEP  Plan: Patient agrees to discharge.  Patient goals were not met. Patient is being discharged due to the patient's request.  ?????       Percival Spanish, PT, MPT 12/31/2015, 6:28 PM  [facility] [address]  [place], [place], [address] Phone: [phone]   Fax:  [phone]

## 2016-01-01 ENCOUNTER — Ambulatory Visit (INDEPENDENT_AMBULATORY_CARE_PROVIDER_SITE_OTHER): Payer: Medicare HMO | Admitting: Family Medicine

## 2016-01-01 ENCOUNTER — Encounter: Payer: Self-pay | Admitting: Family Medicine

## 2016-01-01 VITALS — BP 122/82 | HR 91 | Temp 98.7°F | Ht 60.0 in | Wt 128.5 lb

## 2016-01-01 DIAGNOSIS — K219 Gastro-esophageal reflux disease without esophagitis: Secondary | ICD-10-CM | POA: Diagnosis not present

## 2016-01-01 DIAGNOSIS — G4700 Insomnia, unspecified: Secondary | ICD-10-CM

## 2016-01-01 DIAGNOSIS — D649 Anemia, unspecified: Secondary | ICD-10-CM

## 2016-01-01 DIAGNOSIS — G90519 Complex regional pain syndrome I of unspecified upper limb: Secondary | ICD-10-CM | POA: Insufficient documentation

## 2016-01-01 DIAGNOSIS — G90511 Complex regional pain syndrome I of right upper limb: Secondary | ICD-10-CM | POA: Diagnosis not present

## 2016-01-01 DIAGNOSIS — IMO0002 Reserved for concepts with insufficient information to code with codable children: Secondary | ICD-10-CM

## 2016-01-01 HISTORY — DX: Reserved for concepts with insufficient information to code with codable children: IMO0002

## 2016-01-01 HISTORY — DX: Complex regional pain syndrome I of unspecified upper limb: G90.519

## 2016-01-01 MED ORDER — GABAPENTIN 300 MG PO CAPS: ORAL_CAPSULE | ORAL | Status: DC

## 2016-01-01 MED ORDER — OXYCODONE HCL 5 MG PO TABS: 5.0000 mg | ORAL_TABLET | ORAL | Status: DC | PRN

## 2016-01-01 MED ORDER — CLONAZEPAM 1 MG PO TABS: 1.0000 mg | ORAL_TABLET | Freq: Three times a day (TID) | ORAL | Status: DC | PRN

## 2016-01-01 NOTE — Progress Notes (Signed)
Pre visit review using our clinic review tool, if applicable. No additional management support is needed unless otherwise documented below in the visit note. 

## 2016-01-01 NOTE — Patient Instructions (Signed)
Reflex Sympathetic Dystrophy  Complex Regional Pain Syndrome Complex regional pain syndrome (CRPS) is a nerve disorder that causes long-lasting (chronic) pain, usually in a hand, arm, leg, or foot. CRPS usually follows an injury or trauma, such as a fracture or sprain. There are two types of CRPS:  Type 1. This type occurs after an injury or trauma with no known damage to a nerve.  Type 2. This type occurs after injury or trauma damages a nerve. There are three stages of the condition:  Stage 1. This stage, called the acute stage, may last for three months.  Stage 2. This stage, called the dystrophic stage, may last for three to 12 months.  Stage 3. This stage, called the atrophic stage, may start after one year. CRPS ranges from mild to severe. For most people CRPS is mild and recovery happens over time. For others, CRPS lasts a very long time and is debilitating. CAUSES The exact cause of CRPS is not known. RISK FACTORS You may be at increased risk if:  You are a woman.  You are approximately 66 years of age.  You have any of the following:  A family history of CRPS.  An injury or surgery.  An infection.  Cancer.  Neck problems.  A stroke.  A heart attack.  Asthma. SIGNS AND SYMPTOMS Signs and symptoms in the affected limb are different for each stage. Signs and symptoms of stage 1 include:  Burning pain.  A pins and needles sensation.  Extremely sensitive skin.  Swelling.  Joint stiffness.  Warmth and redness.  Excessive sweating.  Hair and nail growth that is faster than normal. Signs and symptoms of stage 2 include:  Spreading of pain to the whole limb.  Increased skin sensitivity.  Increased swelling and stiffness.  Coolness of the skin.  Blue discoloration of skin.  Loss of skin wrinkles.  Brittle fingernails. Signs and symptoms of stage 3 include:   Pain that spreads to other areas of the body but becomes less severe.  More  stiffness, leading to loss of motion.  Skin that is pale, dry, shiny, and tightly stretched. DIAGNOSIS There is no test to diagnose CRPS. Your health care provider will make a diagnosis based on your signs and symptoms and a physical exam. The exam may include tests to rule out other possible causes of your symptoms. Sometimes imaging tests are done, such as an MRI or bone scan. These tests check for bone changes that might indicate CRPS.  TREATMENT Early treatment may prevent CRPS from advancing past stage 1. There is no one treatment that works for everyone. Treatment options may include:  Medicines, such as:  Nonsteroidal-anti-inflammatory drugs (NSAIDS).  Steroids.  Blood pressure drugs.  Antidepressants.  Anti-seizure drugs.  Pain relievers.  Exercise.  Occupational and physical therapy.  Biofeedback.  Mental health counseling.  Numbing injections.  Spinal surgery to implant a spinal cord stimulator or a pain pump. HOME CARE INSTRUCTIONS  Take medicines only as directed by your health care provider.  Follow an exercise program as directed by your health care provider.  Maintain a healthy weight.  Keep all follow-up visits as directed by your health care provider. This is important. SEEK MEDICAL CARE IF:  Your symptoms change.  Your symptoms get worse.  You develop anxiety or depression.   This information is not intended to replace advice given to you by your health care provider. Make sure you discuss any questions you have with your health care provider.  Document Released: 10/17/2002 Document Revised: 11/17/2014 Document Reviewed: 07/24/2014 Elsevier Interactive Patient Education [employer].

## 2016-01-02 ENCOUNTER — Ambulatory Visit: Payer: Medicare HMO | Admitting: Physical Therapy

## 2016-01-02 DIAGNOSIS — M79601 Pain in right arm: Secondary | ICD-10-CM | POA: Diagnosis not present

## 2016-01-02 DIAGNOSIS — M7989 Other specified soft tissue disorders: Secondary | ICD-10-CM | POA: Diagnosis not present

## 2016-01-04 ENCOUNTER — Ambulatory Visit: Payer: Medicare HMO | Admitting: Physical Therapy

## 2016-01-04 DIAGNOSIS — M79601 Pain in right arm: Secondary | ICD-10-CM | POA: Diagnosis not present

## 2016-01-04 DIAGNOSIS — M7989 Other specified soft tissue disorders: Secondary | ICD-10-CM | POA: Diagnosis not present

## 2016-01-07 ENCOUNTER — Ambulatory Visit: Payer: Medicare HMO | Admitting: Physical Therapy

## 2016-01-07 DIAGNOSIS — M79601 Pain in right arm: Secondary | ICD-10-CM | POA: Diagnosis not present

## 2016-01-07 DIAGNOSIS — M7989 Other specified soft tissue disorders: Secondary | ICD-10-CM | POA: Diagnosis not present

## 2016-01-09 ENCOUNTER — Ambulatory Visit: Payer: Medicare HMO | Admitting: Physical Therapy

## 2016-01-09 DIAGNOSIS — M79601 Pain in right arm: Secondary | ICD-10-CM | POA: Diagnosis not present

## 2016-01-09 DIAGNOSIS — M7989 Other specified soft tissue disorders: Secondary | ICD-10-CM | POA: Diagnosis not present

## 2016-01-10 DIAGNOSIS — R208 Other disturbances of skin sensation: Secondary | ICD-10-CM | POA: Diagnosis not present

## 2016-01-10 DIAGNOSIS — S46011D Strain of muscle(s) and tendon(s) of the rotator cuff of right shoulder, subsequent encounter: Secondary | ICD-10-CM | POA: Diagnosis not present

## 2016-01-10 DIAGNOSIS — Z4789 Encounter for other orthopedic aftercare: Secondary | ICD-10-CM | POA: Diagnosis not present

## 2016-01-10 DIAGNOSIS — Z9889 Other specified postprocedural states: Secondary | ICD-10-CM | POA: Diagnosis not present

## 2016-01-11 ENCOUNTER — Ambulatory Visit: Payer: Medicare HMO | Admitting: Physical Therapy

## 2016-01-11 DIAGNOSIS — M79601 Pain in right arm: Secondary | ICD-10-CM | POA: Diagnosis not present

## 2016-01-11 DIAGNOSIS — M7989 Other specified soft tissue disorders: Secondary | ICD-10-CM | POA: Diagnosis not present

## 2016-01-13 ENCOUNTER — Encounter: Payer: Self-pay | Admitting: Family Medicine

## 2016-01-13 DIAGNOSIS — D649 Anemia, unspecified: Secondary | ICD-10-CM

## 2016-01-13 HISTORY — DX: Anemia, unspecified: D64.9

## 2016-01-13 NOTE — Assessment & Plan Note (Signed)
New, mild asymptomatic, Increase leafy greens, consider increased lean red meat and using cast iron cookware. Continue to monitor, report any concerns

## 2016-01-13 NOTE — Assessment & Plan Note (Signed)
Avoid offending foods, take probiotics. Do not eat large meals in late evening and consider raising head of bed. Minimize NSAIDs

## 2016-01-13 NOTE — Progress Notes (Signed)
Patient ID: Stephanie Barnes, female   DOB: 27-Dec-1950, 65 y.o.   MRN: [id number]   Subjective:    Patient ID: Stephanie Barnes, female    DOB: Jan 10, 1951, 65 y.o.   MRN: [id number]  Chief Complaint  Patient presents with  . Follow-up    HPI Patient is in today for ER follow up. She presented to the emergency room with severe right arm pain. She was having trouble sleeping and getting into a comfortable position. She had no acute fall or injury. She was diagnosed with reflex sympathetic dystrophy and is now undergoing therapy and establishing with orthopedics. Symptoms are manageable but daily at this time. No other acute complaints or recent illnesses noted. Denies CP/palp/SOB/HA/congestion/fevers/GI or GU c/o. Taking meds as prescribed  Past Medical History  Diagnosis Date  . Anxiety   . Arthritis   . Depression   . 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 Rotator cuff tear   . Medicare annual wellness visit, subsequent 10/02/2015  . Cancer ([place])   . Welcome to Medicare preventive visit 10/02/2015  . 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 Anemia 01/13/2016    Past Surgical History  Procedure Laterality Date  . Tonsillectomy    . Dental surgery      Root cleaning   . Refractive surgery Bilateral 2000  . Rotator cuff repair      Family History  Problem Relation Age of Onset  . Heart disease Mother   . Arthritis Mother   . Heart disease Father   . COPD Father   . Mental illness Brother     bipolar  . Hypertension Brother   . Suicidality Brother   . Alcohol abuse Brother   . Heart disease Maternal Aunt   . Heart disease Maternal Uncle   . Heart disease Paternal Aunt   . COPD Paternal 8   . Heart disease Paternal Uncle   . Heart disease Maternal Grandmother   . Heart disease Maternal Grandfather   . Heart disease Paternal Grandmother   . Heart disease Paternal Grandfather     Social History   Social History  . Marital Status: Single    Spouse Name: N/A  . Number of Children:  N/A  . Years of Education: N/A   Occupational History  . Not on file.   Social History Main Topics  . Smoking status: Never Smoker   . Smokeless tobacco: Never Used  . Alcohol Use: 0.6 oz/week    1 Glasses of wine per week     Comment: per month  . Drug Use: No  . Sexual Activity: No     Comment: lives alone, retired from [employer], no dietary restrictions   Other Topics Concern  . Not on file   Social History Narrative    Outpatient Prescriptions Prior to Visit  Medication Sig Dispense Refill  . buPROPion (WELLBUTRIN) 75 MG tablet 1/4 tablet daily (Patient taking differently: Take by mouth daily. 1/4 tablet daily) 30 tablet 6  . Calcium Carbonate-Vit D-Min (CALCIUM 1200 PO) Take 1 tablet by mouth 2 (two) times daily.    . cetirizine (ZYRTEC) 10 MG tablet Take 10 mg by mouth daily as needed.     . chlorhexidine (PERIDEX) 0.12 % solution Use as directed 5 mLs in the mouth or throat at bedtime.     . docusate sodium (COLACE) 100 MG capsule Take 100 mg by mouth 2 (two) times daily.    Marland Kitchen HYDROmorphone (DILAUDID) 2 MG tablet Take 2 mg by mouth  every 6 (six) hours as needed for severe pain.     Marland Kitchen ibuprofen (ADVIL,MOTRIN) 800 MG tablet Take 800 mg by mouth 3 (three) times daily.    . methocarbamol (ROBAXIN) 500 MG tablet Take 500 mg by mouth every 6 (six) hours as needed for muscle spasms.    . mometasone (ELOCON) 0.1 % ointment Apply topically daily. As needed 15 g 0  . Omega-3 Fatty Acids (FISH OIL) 1000 MG CAPS Take 1 capsule by mouth 2 (two) times daily.     . pantoprazole (PROTONIX) 40 MG tablet Take 1 tablet (40 mg total) by mouth daily. 90 tablet 2  . potassium chloride SA (K-DUR,KLOR-CON) 20 MEQ tablet Take 2 tablets for 3 days, then 1 tablet once daily. (Patient taking differently: Take 20 mEq by mouth daily. ) 33 tablet 0  . Probiotic Product (PROBIOTIC DAILY PO) Take 1 tablet by mouth daily.     . clonazePAM (KLONOPIN) 1 MG tablet TAKE ONE-HALF TO ONE TABLET BY MOUTH  TWICE DAILY AS NEEDED FOR ANXIETY 30 tablet 1  . gabapentin (NEURONTIN) 300 MG capsule Take 300 mg by mouth as directed. Med Instructions: Take 1 capsule daily at qhs for 3 days, Take 1 capsule bid for 3 days then Take 1 capsule tid.    Marland Kitchen oxyCODONE (OXY IR/ROXICODONE) 5 MG immediate release tablet Take 1 tablet (5 mg total) by mouth every 4 (four) hours as needed for severe pain. 30 tablet 0  . traMADol (ULTRAM) 50 MG tablet Take 1 tablet (50 mg total) by mouth every 8 (eight) hours as needed. 30 tablet 0   No facility-administered medications prior to visit.    Allergies  Allergen Reactions  . Aspirin Anaphylaxis and Swelling    Swelling of lips and fingers   . Celebrex [Celecoxib] Anaphylaxis, Hives and Shortness Of Breath  . Doxycycline Other (See Comments)    Scared throat   . Prednisone Shortness Of Breath and Other (See Comments)    Chest tightness   . Celexa [Citalopram] Other (See Comments)    Shakiness   . Fosamax [Alendronate Sodium] Hives  . Clindamycin/Lincomycin Nausea And Vomiting  . Other Hives and Nausea Only    Arthritec    ROS     Objective:    Physical Exam  BP 122/82 mmHg  Pulse 91  Temp(Src) 98.7 F (37.1 C) (Oral)  Ht 5' (1.524 m)  Wt 128 lb 8 oz (58.287 kg)  BMI 25.10 kg/m2  SpO2 99% Wt Readings from Last 3 Encounters:  01/01/16 128 lb 8 oz (58.287 kg)  12/26/15 125 lb 4 oz (56.813 kg)  10/02/15 134 lb 6 oz (60.952 kg)     Lab Results  Component Value Date   WBC 5.0 12/26/2015   HGB 11.7* 12/26/2015   HCT 35.2* 12/26/2015   PLT 207 12/26/2015   GLUCOSE 102* 12/26/2015   CHOL 218* 09/13/2015   TRIG 95.0 09/13/2015   HDL 81.30 09/13/2015   LDLCALC 118* 09/13/2015   ALT 11 12/19/2015   AST 12 12/19/2015   NA 140 12/26/2015   K 4.1 12/26/2015   CL 104 12/26/2015   CREATININE 0.76 12/26/2015   BUN 21* 12/26/2015   CO2 28 12/26/2015   TSH 2.18 09/13/2015    Lab Results  Component Value Date   TSH 2.18 09/13/2015   Lab Results    Component Value Date   WBC 5.0 12/26/2015   HGB 11.7* 12/26/2015   HCT 35.2* 12/26/2015   MCV 90.3 12/26/2015  PLT 207 12/26/2015   Lab Results  Component Value Date   NA 140 12/26/2015   K 4.1 12/26/2015   CO2 28 12/26/2015   GLUCOSE 102* 12/26/2015   BUN 21* 12/26/2015   CREATININE 0.76 12/26/2015   BILITOT 0.4 12/19/2015   ALKPHOS 42 12/19/2015   AST 12 12/19/2015   ALT 11 12/19/2015   PROT 7.1 12/19/2015   ALBUMIN 4.3 12/19/2015   CALCIUM 9.9 12/26/2015   ANIONGAP 8 12/26/2015   GFR 67.57 12/19/2015   Lab Results  Component Value Date   CHOL 218* 09/13/2015   Lab Results  Component Value Date   HDL 81.30 09/13/2015   Lab Results  Component Value Date   LDLCALC 118* 09/13/2015   Lab Results  Component Value Date   TRIG 95.0 09/13/2015   Lab Results  Component Value Date   CHOLHDL 3 09/13/2015   No results found for: HGBA1C     Assessment & Plan:   Problem List Items Addressed This Visit    Anemia    New, mild asymptomatic, Increase leafy greens, consider increased lean red meat and using cast iron cookware. Continue to monitor, report any concerns      GERD (gastroesophageal reflux disease)    Avoid offending foods, take probiotics. Do not eat large meals in late evening and consider raising head of bed. Minimize NSAIDs      Insomnia    Encouraged good sleep hygiene such as dark, quiet room. No blue/green glowing lights such as computer screens in bedroom. No alcohol or stimulants in evening. Cut down on caffeine as able. Regular exercise is helpful but not just prior to bed time. May use Clonazepam prn      RSD upper limb - Primary    Was seen in ED with pain crisis and is now set up with therapy and [facility]. Pain persists but is managable.      Relevant Medications   clonazePAM (KLONOPIN) 1 MG tablet   gabapentin (NEURONTIN) 300 MG capsule      I have discontinued Ms. Carll's traMADol and gabapentin. I have also changed her  clonazePAM. Additionally, I am having her start on gabapentin. Lastly, I am having her maintain her Probiotic Product (PROBIOTIC DAILY PO), Fish Oil, Calcium Carbonate-Vit D-Min (CALCIUM 1200 PO), cetirizine, pantoprazole, mometasone, buPROPion, HYDROmorphone, methocarbamol, potassium chloride SA, ibuprofen, docusate sodium, chlorhexidine, and oxyCODONE.  Meds ordered this encounter  Medications  . clonazePAM (KLONOPIN) 1 MG tablet    Sig: Take 1 tablet (1 mg total) by mouth 3 (three) times daily as needed for anxiety.    Dispense:  90 tablet    Refill:  1  . oxyCODONE (OXY IR/ROXICODONE) 5 MG immediate release tablet    Sig: Take 1 tablet (5 mg total) by mouth every 4 (four) hours as needed for severe pain.    Dispense:  30 tablet    Refill:  0  . gabapentin (NEURONTIN) 300 MG capsule    Sig: 1 tab po bid and 2 tabs po qhs    Dispense:  120 capsule    Refill:  3     Karilynn Carranza, MD

## 2016-01-13 NOTE — Assessment & Plan Note (Signed)
Was seen in ED with pain crisis and is now set up with therapy and [facility]. Pain persists but is managable.

## 2016-01-13 NOTE — Assessment & Plan Note (Signed)
Encouraged good sleep hygiene such as dark, quiet room. No blue/green glowing lights such as computer screens in bedroom. No alcohol or stimulants in evening. Cut down on caffeine as able. Regular exercise is helpful but not just prior to bed time. May use Clonazepam prn

## 2016-01-14 DIAGNOSIS — M79601 Pain in right arm: Secondary | ICD-10-CM | POA: Diagnosis not present

## 2016-01-14 DIAGNOSIS — M7989 Other specified soft tissue disorders: Secondary | ICD-10-CM | POA: Diagnosis not present

## 2016-01-16 DIAGNOSIS — M7989 Other specified soft tissue disorders: Secondary | ICD-10-CM | POA: Diagnosis not present

## 2016-01-16 DIAGNOSIS — M79601 Pain in right arm: Secondary | ICD-10-CM | POA: Diagnosis not present

## 2016-01-18 DIAGNOSIS — M79601 Pain in right arm: Secondary | ICD-10-CM | POA: Diagnosis not present

## 2016-01-18 DIAGNOSIS — M7989 Other specified soft tissue disorders: Secondary | ICD-10-CM | POA: Diagnosis not present

## 2016-01-22 DIAGNOSIS — G8918 Other acute postprocedural pain: Secondary | ICD-10-CM | POA: Diagnosis not present

## 2016-01-23 DIAGNOSIS — M79601 Pain in right arm: Secondary | ICD-10-CM | POA: Diagnosis not present

## 2016-01-23 DIAGNOSIS — M7989 Other specified soft tissue disorders: Secondary | ICD-10-CM | POA: Diagnosis not present

## 2016-01-25 DIAGNOSIS — M79601 Pain in right arm: Secondary | ICD-10-CM | POA: Diagnosis not present

## 2016-01-25 DIAGNOSIS — M7989 Other specified soft tissue disorders: Secondary | ICD-10-CM | POA: Diagnosis not present

## 2016-01-28 DIAGNOSIS — M79601 Pain in right arm: Secondary | ICD-10-CM | POA: Diagnosis not present

## 2016-01-28 DIAGNOSIS — M7989 Other specified soft tissue disorders: Secondary | ICD-10-CM | POA: Diagnosis not present

## 2016-01-29 DIAGNOSIS — M7989 Other specified soft tissue disorders: Secondary | ICD-10-CM | POA: Diagnosis not present

## 2016-01-29 DIAGNOSIS — M79601 Pain in right arm: Secondary | ICD-10-CM | POA: Diagnosis not present

## 2016-01-29 DIAGNOSIS — G90511 Complex regional pain syndrome I of right upper limb: Secondary | ICD-10-CM | POA: Diagnosis not present

## 2016-01-31 DIAGNOSIS — M79601 Pain in right arm: Secondary | ICD-10-CM | POA: Diagnosis not present

## 2016-01-31 DIAGNOSIS — M7989 Other specified soft tissue disorders: Secondary | ICD-10-CM | POA: Diagnosis not present

## 2016-02-04 DIAGNOSIS — G90511 Complex regional pain syndrome I of right upper limb: Secondary | ICD-10-CM | POA: Diagnosis not present

## 2016-02-04 DIAGNOSIS — M7989 Other specified soft tissue disorders: Secondary | ICD-10-CM | POA: Diagnosis not present

## 2016-02-04 DIAGNOSIS — M79601 Pain in right arm: Secondary | ICD-10-CM | POA: Diagnosis not present

## 2016-02-04 DIAGNOSIS — G90512 Complex regional pain syndrome I of left upper limb: Secondary | ICD-10-CM | POA: Diagnosis not present

## 2016-02-05 DIAGNOSIS — M79601 Pain in right arm: Secondary | ICD-10-CM | POA: Diagnosis not present

## 2016-02-05 DIAGNOSIS — M7989 Other specified soft tissue disorders: Secondary | ICD-10-CM | POA: Diagnosis not present

## 2016-02-08 DIAGNOSIS — M7989 Other specified soft tissue disorders: Secondary | ICD-10-CM | POA: Diagnosis not present

## 2016-02-08 DIAGNOSIS — M79601 Pain in right arm: Secondary | ICD-10-CM | POA: Diagnosis not present

## 2016-02-08 DIAGNOSIS — G90512 Complex regional pain syndrome I of left upper limb: Secondary | ICD-10-CM | POA: Diagnosis not present

## 2016-02-08 DIAGNOSIS — G90511 Complex regional pain syndrome I of right upper limb: Secondary | ICD-10-CM | POA: Diagnosis not present

## 2016-02-12 DIAGNOSIS — M7989 Other specified soft tissue disorders: Secondary | ICD-10-CM | POA: Diagnosis not present

## 2016-02-12 DIAGNOSIS — M79601 Pain in right arm: Secondary | ICD-10-CM | POA: Diagnosis not present

## 2016-02-13 DIAGNOSIS — G90511 Complex regional pain syndrome I of right upper limb: Secondary | ICD-10-CM | POA: Diagnosis not present

## 2016-02-13 DIAGNOSIS — M7989 Other specified soft tissue disorders: Secondary | ICD-10-CM | POA: Diagnosis not present

## 2016-02-13 DIAGNOSIS — M79601 Pain in right arm: Secondary | ICD-10-CM | POA: Diagnosis not present

## 2016-02-19 DIAGNOSIS — M79601 Pain in right arm: Secondary | ICD-10-CM | POA: Diagnosis not present

## 2016-02-19 DIAGNOSIS — M7989 Other specified soft tissue disorders: Secondary | ICD-10-CM | POA: Diagnosis not present

## 2016-02-21 DIAGNOSIS — M7989 Other specified soft tissue disorders: Secondary | ICD-10-CM | POA: Diagnosis not present

## 2016-02-21 DIAGNOSIS — M79601 Pain in right arm: Secondary | ICD-10-CM | POA: Diagnosis not present

## 2016-02-21 DIAGNOSIS — G90511 Complex regional pain syndrome I of right upper limb: Secondary | ICD-10-CM | POA: Diagnosis not present

## 2016-02-25 DIAGNOSIS — M79601 Pain in right arm: Secondary | ICD-10-CM | POA: Diagnosis not present

## 2016-02-25 DIAGNOSIS — M7989 Other specified soft tissue disorders: Secondary | ICD-10-CM | POA: Diagnosis not present

## 2016-02-28 DIAGNOSIS — M7989 Other specified soft tissue disorders: Secondary | ICD-10-CM | POA: Diagnosis not present

## 2016-02-28 DIAGNOSIS — M79601 Pain in right arm: Secondary | ICD-10-CM | POA: Diagnosis not present

## 2016-03-04 DIAGNOSIS — M7989 Other specified soft tissue disorders: Secondary | ICD-10-CM | POA: Diagnosis not present

## 2016-03-04 DIAGNOSIS — M79601 Pain in right arm: Secondary | ICD-10-CM | POA: Diagnosis not present

## 2016-03-05 ENCOUNTER — Telehealth: Payer: Self-pay

## 2016-03-05 ENCOUNTER — Encounter: Payer: Self-pay | Admitting: Family Medicine

## 2016-03-05 ENCOUNTER — Other Ambulatory Visit: Payer: Self-pay | Admitting: Family Medicine

## 2016-03-05 NOTE — Telephone Encounter (Signed)
Med rec updated.

## 2016-03-05 NOTE — Telephone Encounter (Signed)
Med list updated to reflect changes in Wellbutrin dose.  Message routed to PCP for FYI.

## 2016-03-06 ENCOUNTER — Other Ambulatory Visit: Payer: Self-pay | Admitting: Family Medicine

## 2016-03-06 DIAGNOSIS — M7989 Other specified soft tissue disorders: Secondary | ICD-10-CM | POA: Diagnosis not present

## 2016-03-06 DIAGNOSIS — M79601 Pain in right arm: Secondary | ICD-10-CM | POA: Diagnosis not present

## 2016-03-06 NOTE — Telephone Encounter (Signed)
Faxed hardcopy for Clonazepam to [facility] [place]

## 2016-03-11 DIAGNOSIS — M7989 Other specified soft tissue disorders: Secondary | ICD-10-CM | POA: Diagnosis not present

## 2016-03-11 DIAGNOSIS — G90511 Complex regional pain syndrome I of right upper limb: Secondary | ICD-10-CM | POA: Diagnosis not present

## 2016-03-11 DIAGNOSIS — M79601 Pain in right arm: Secondary | ICD-10-CM | POA: Diagnosis not present

## 2016-03-13 DIAGNOSIS — M7989 Other specified soft tissue disorders: Secondary | ICD-10-CM | POA: Diagnosis not present

## 2016-03-13 DIAGNOSIS — M79601 Pain in right arm: Secondary | ICD-10-CM | POA: Diagnosis not present

## 2016-03-17 DIAGNOSIS — M7989 Other specified soft tissue disorders: Secondary | ICD-10-CM | POA: Diagnosis not present

## 2016-03-17 DIAGNOSIS — M79601 Pain in right arm: Secondary | ICD-10-CM | POA: Diagnosis not present

## 2016-03-19 DIAGNOSIS — M7989 Other specified soft tissue disorders: Secondary | ICD-10-CM | POA: Diagnosis not present

## 2016-03-19 DIAGNOSIS — G90511 Complex regional pain syndrome I of right upper limb: Secondary | ICD-10-CM | POA: Diagnosis not present

## 2016-03-19 DIAGNOSIS — M79601 Pain in right arm: Secondary | ICD-10-CM | POA: Diagnosis not present

## 2016-03-25 DIAGNOSIS — M7989 Other specified soft tissue disorders: Secondary | ICD-10-CM | POA: Diagnosis not present

## 2016-03-25 DIAGNOSIS — M79601 Pain in right arm: Secondary | ICD-10-CM | POA: Diagnosis not present

## 2016-03-27 DIAGNOSIS — M79601 Pain in right arm: Secondary | ICD-10-CM | POA: Diagnosis not present

## 2016-03-27 DIAGNOSIS — M7989 Other specified soft tissue disorders: Secondary | ICD-10-CM | POA: Diagnosis not present

## 2016-03-28 ENCOUNTER — Encounter: Payer: Self-pay | Admitting: Family Medicine

## 2016-03-31 ENCOUNTER — Ambulatory Visit (INDEPENDENT_AMBULATORY_CARE_PROVIDER_SITE_OTHER): Payer: Medicare HMO | Admitting: Family Medicine

## 2016-03-31 ENCOUNTER — Encounter: Payer: Self-pay | Admitting: Family Medicine

## 2016-03-31 ENCOUNTER — Other Ambulatory Visit: Payer: Self-pay | Admitting: Family Medicine

## 2016-03-31 VITALS — BP 130/78 | HR 78 | Temp 98.3°F | Ht 60.0 in | Wt 133.5 lb

## 2016-03-31 DIAGNOSIS — IMO0002 Reserved for concepts with insufficient information to code with codable children: Secondary | ICD-10-CM

## 2016-03-31 DIAGNOSIS — L309 Dermatitis, unspecified: Secondary | ICD-10-CM

## 2016-03-31 DIAGNOSIS — M7501 Adhesive capsulitis of right shoulder: Secondary | ICD-10-CM

## 2016-03-31 DIAGNOSIS — M8580 Other specified disorders of bone density and structure, unspecified site: Secondary | ICD-10-CM

## 2016-03-31 DIAGNOSIS — M779 Enthesopathy, unspecified: Secondary | ICD-10-CM

## 2016-03-31 DIAGNOSIS — D649 Anemia, unspecified: Secondary | ICD-10-CM

## 2016-03-31 DIAGNOSIS — G4700 Insomnia, unspecified: Secondary | ICD-10-CM | POA: Diagnosis not present

## 2016-03-31 DIAGNOSIS — K219 Gastro-esophageal reflux disease without esophagitis: Secondary | ICD-10-CM

## 2016-03-31 DIAGNOSIS — M6588 Other synovitis and tenosynovitis, other site: Secondary | ICD-10-CM

## 2016-03-31 DIAGNOSIS — G9050 Complex regional pain syndrome I, unspecified: Secondary | ICD-10-CM

## 2016-03-31 DIAGNOSIS — Z889 Allergy status to unspecified drugs, medicaments and biological substances status: Secondary | ICD-10-CM

## 2016-03-31 DIAGNOSIS — E785 Hyperlipidemia, unspecified: Secondary | ICD-10-CM | POA: Diagnosis not present

## 2016-03-31 DIAGNOSIS — T7840XA Allergy, unspecified, initial encounter: Secondary | ICD-10-CM | POA: Insufficient documentation

## 2016-03-31 DIAGNOSIS — Z9109 Other allergy status, other than to drugs and biological substances: Secondary | ICD-10-CM

## 2016-03-31 DIAGNOSIS — M778 Other enthesopathies, not elsewhere classified: Secondary | ICD-10-CM

## 2016-03-31 HISTORY — DX: Allergy status to unspecified drugs, medicaments and biological substances: Z88.9

## 2016-03-31 LAB — COMPREHENSIVE METABOLIC PANEL
ALK PHOS: 42 U/L (ref 39–117)
ALT: 14 U/L (ref 0–35)
AST: 15 U/L (ref 0–37)
Albumin: 4.2 g/dL (ref 3.5–5.2)
BUN: 22 mg/dL (ref 6–23)
CO2: 27 meq/L (ref 19–32)
Calcium: 9.4 mg/dL (ref 8.4–10.5)
Chloride: 104 mEq/L (ref 96–112)
Creatinine, Ser: 0.89 mg/dL (ref 0.40–1.20)
GFR: 67.51 mL/min (ref >60.00)
GLUCOSE: 84 mg/dL (ref 70–99)
POTASSIUM: 4.2 meq/L (ref 3.5–5.1)
Sodium: 138 mEq/L (ref 135–145)
Total Bilirubin: 0.4 mg/dL (ref 0.2–1.2)
Total Protein: 6.6 g/dL (ref 6.0–8.3)

## 2016-03-31 LAB — LIPID PANEL
CHOL/HDL RATIO: 3
Cholesterol: 219 mg/dL — ABNORMAL HIGH (ref 0–200)
HDL: 72.4 mg/dL (ref >39.00)
LDL Cholesterol: 121 mg/dL — ABNORMAL HIGH (ref 0–99)
NONHDL: 147.08
Triglycerides: 131 mg/dL (ref 0.0–149.0)
VLDL: 26.2 mg/dL (ref 0.0–40.0)

## 2016-03-31 LAB — CBC
HEMATOCRIT: 33 % — AB (ref 36.0–46.0)
Hemoglobin: 11.1 g/dL — ABNORMAL LOW (ref 12.0–15.0)
MCHC: 33.5 g/dL (ref 30.0–36.0)
MCV: 87.9 fl (ref 78.0–100.0)
Platelets: 204 10*3/uL (ref 150.0–400.0)
RBC: 3.76 Mil/uL — ABNORMAL LOW (ref 3.87–5.11)
RDW: 13.4 % (ref 11.5–15.5)
WBC: 4.7 10*3/uL (ref 4.0–10.5)

## 2016-03-31 LAB — TSH: TSH: 3.14 u[IU]/mL (ref 0.35–4.50)

## 2016-03-31 MED ORDER — BUPROPION HCL 75 MG PO TABS: ORAL_TABLET | ORAL | Status: DC

## 2016-03-31 NOTE — Patient Instructions (Addendum)
Increase leafy greens, consider increased lean red meat and using cast iron cookware. Continue to monitor, report any concerns  Complex Regional Pain Syndrome Complex regional pain syndrome (CRPS) is a nerve disorder that causes long-lasting (chronic) pain, usually in a hand, arm, leg, or foot. CRPS usually follows an injury or trauma, such as a fracture or sprain. There are two types of CRPS:  Type 1. This type occurs after an injury or trauma with no known damage to a nerve.  Type 2. This type occurs after injury or trauma damages a nerve. There are three stages of the condition:  Stage 1. This stage, called the acute stage, may last for three months.  Stage 2. This stage, called the dystrophic stage, may last for three to 12 months.  Stage 3. This stage, called the atrophic stage, may start after one year. CRPS ranges from mild to severe. For most people CRPS is mild and recovery happens over time. For others, CRPS lasts a very long time and is debilitating. CAUSES The exact cause of CRPS is not known. RISK FACTORS You may be at increased risk if:  You are a woman.  You are approximately 65 years of age.  You have any of the following:  A family history of CRPS.  An injury or surgery.  An infection.  Cancer.  Neck problems.  A stroke.  A heart attack.  Asthma. SIGNS AND SYMPTOMS Signs and symptoms in the affected limb are different for each stage. Signs and symptoms of stage 1 include:  Burning pain.  A pins and needles sensation.  Extremely sensitive skin.  Swelling.  Joint stiffness.  Warmth and redness.  Excessive sweating.  Hair and nail growth that is faster than normal. Signs and symptoms of stage 2 include:  Spreading of pain to the whole limb.  Increased skin sensitivity.  Increased swelling and stiffness.  Coolness of the skin.  Blue discoloration of skin.  Loss of skin wrinkles.  Brittle fingernails. Signs and symptoms of stage  3 include:   Pain that spreads to other areas of the body but becomes less severe.  More stiffness, leading to loss of motion.  Skin that is pale, dry, shiny, and tightly stretched. DIAGNOSIS There is no test to diagnose CRPS. Your health care provider will make a diagnosis based on your signs and symptoms and a physical exam. The exam may include tests to rule out other possible causes of your symptoms. Sometimes imaging tests are done, such as an MRI or bone scan. These tests check for bone changes that might indicate CRPS.  TREATMENT Early treatment may prevent CRPS from advancing past stage 1. There is no one treatment that works for everyone. Treatment options may include:  Medicines, such as:  Nonsteroidal-anti-inflammatory drugs (NSAIDS).  Steroids.  Blood pressure drugs.  Antidepressants.  Anti-seizure drugs.  Pain relievers.  Exercise.  Occupational and physical therapy.  Biofeedback.  Mental health counseling.  Numbing injections.  Spinal surgery to implant a spinal cord stimulator or a pain pump. HOME CARE INSTRUCTIONS  Take medicines only as directed by your health care provider.  Follow an exercise program as directed by your health care provider.  Maintain a healthy weight.  Keep all follow-up visits as directed by your health care provider. This is important. SEEK MEDICAL CARE IF:  Your symptoms change.  Your symptoms get worse.  You develop anxiety or depression.   This information is not intended to replace advice given to you by your health  care provider. Make sure you discuss any questions you have with your health care provider.   Document Released: 10/17/2002 Document Revised: 11/17/2014 Document Reviewed: 07/24/2014 Elsevier Interactive Patient Education [employer].

## 2016-03-31 NOTE — Assessment & Plan Note (Addendum)
Developed after shoulder surgery, with [facility], Dr Tonita Cong who referred her for the nerve blocks to manage her complications. Gets nerve blocks at [facility] [place] with Dr Carroll Kinds and she gets relief of swelling and helps tingling some. Gabapentin helps the symptoms also especially when she cannot get the nerve blocks. Takes 1 tab bid and 2 qhs, she had to drop her dosing because at doses higher than this she was getting blurry vision.

## 2016-03-31 NOTE — Assessment & Plan Note (Signed)
Posterior neck flares rarely steroid creams help prn

## 2016-03-31 NOTE — Assessment & Plan Note (Signed)
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
## 2016-03-31 NOTE — Assessment & Plan Note (Signed)
Avoid offending foods, take probiotics. Do not eat large meals in late evening and consider raising head of bed.  

## 2016-03-31 NOTE — Assessment & Plan Note (Signed)
Encouraged heart healthy diet, increase exercise, avoid trans fats, consider a krill oil cap daily 
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
## 2016-03-31 NOTE — Progress Notes (Signed)
Pre visit review using our clinic review tool, if applicable. No additional management support is needed unless otherwise documented below in the visit note. 

## 2016-03-31 NOTE — Assessment & Plan Note (Signed)
Mild, Increase leafy greens, consider increased lean red meat and using cast iron cookware. Continue to monitor, report any concerns 

## 2016-03-31 NOTE — Assessment & Plan Note (Signed)
Has lost grip strength in right hand secondary to complex regional pain syndrome. Is working with PT/OT, [facility]ter, is helping working her hand

## 2016-03-31 NOTE — Assessment & Plan Note (Signed)
Encouraged to get adequate exercise, calcium and vitamin d intake 

## 2016-04-01 DIAGNOSIS — M7989 Other specified soft tissue disorders: Secondary | ICD-10-CM | POA: Diagnosis not present

## 2016-04-01 DIAGNOSIS — M79601 Pain in right arm: Secondary | ICD-10-CM | POA: Diagnosis not present

## 2016-04-03 DIAGNOSIS — M7989 Other specified soft tissue disorders: Secondary | ICD-10-CM | POA: Diagnosis not present

## 2016-04-03 DIAGNOSIS — M79601 Pain in right arm: Secondary | ICD-10-CM | POA: Diagnosis not present

## 2016-04-07 NOTE — Assessment & Plan Note (Signed)
Encouraged good sleep hygiene such as dark, quiet room. No blue/green glowing lights such as computer screens in bedroom. No alcohol or stimulants in evening. Cut down on caffeine as able. Regular exercise is helpful but not just prior to bed time.  

## 2016-04-09 DIAGNOSIS — G90511 Complex regional pain syndrome I of right upper limb: Secondary | ICD-10-CM | POA: Diagnosis not present

## 2016-04-09 DIAGNOSIS — M7989 Other specified soft tissue disorders: Secondary | ICD-10-CM | POA: Diagnosis not present

## 2016-04-09 DIAGNOSIS — M79601 Pain in right arm: Secondary | ICD-10-CM | POA: Diagnosis not present

## 2016-04-11 DIAGNOSIS — M79601 Pain in right arm: Secondary | ICD-10-CM | POA: Diagnosis not present

## 2016-04-11 DIAGNOSIS — M7989 Other specified soft tissue disorders: Secondary | ICD-10-CM | POA: Diagnosis not present

## 2016-04-14 ENCOUNTER — Other Ambulatory Visit: Payer: Self-pay | Admitting: Family Medicine

## 2016-04-16 DIAGNOSIS — M79601 Pain in right arm: Secondary | ICD-10-CM | POA: Diagnosis not present

## 2016-04-16 DIAGNOSIS — G90511 Complex regional pain syndrome I of right upper limb: Secondary | ICD-10-CM | POA: Diagnosis not present

## 2016-04-16 DIAGNOSIS — M7989 Other specified soft tissue disorders: Secondary | ICD-10-CM | POA: Diagnosis not present

## 2016-04-18 DIAGNOSIS — M7989 Other specified soft tissue disorders: Secondary | ICD-10-CM | POA: Diagnosis not present

## 2016-04-18 DIAGNOSIS — M79601 Pain in right arm: Secondary | ICD-10-CM | POA: Diagnosis not present

## 2016-04-23 DIAGNOSIS — M79601 Pain in right arm: Secondary | ICD-10-CM | POA: Diagnosis not present

## 2016-04-23 DIAGNOSIS — G90511 Complex regional pain syndrome I of right upper limb: Secondary | ICD-10-CM | POA: Diagnosis not present

## 2016-04-23 DIAGNOSIS — M7989 Other specified soft tissue disorders: Secondary | ICD-10-CM | POA: Diagnosis not present

## 2016-04-25 DIAGNOSIS — M79601 Pain in right arm: Secondary | ICD-10-CM | POA: Diagnosis not present

## 2016-04-25 DIAGNOSIS — M7989 Other specified soft tissue disorders: Secondary | ICD-10-CM | POA: Diagnosis not present

## 2016-04-28 DIAGNOSIS — M79601 Pain in right arm: Secondary | ICD-10-CM | POA: Diagnosis not present

## 2016-04-28 DIAGNOSIS — M7989 Other specified soft tissue disorders: Secondary | ICD-10-CM | POA: Diagnosis not present

## 2016-04-29 ENCOUNTER — Ambulatory Visit (INDEPENDENT_AMBULATORY_CARE_PROVIDER_SITE_OTHER): Payer: Medicare HMO | Admitting: Physician Assistant

## 2016-04-29 ENCOUNTER — Encounter: Payer: Self-pay | Admitting: Physician Assistant

## 2016-04-29 ENCOUNTER — Encounter: Payer: Self-pay | Admitting: Family Medicine

## 2016-04-29 VITALS — BP 122/86 | HR 71 | Temp 98.0°F | Resp 16 | Ht 60.0 in | Wt 129.1 lb

## 2016-04-29 DIAGNOSIS — J069 Acute upper respiratory infection, unspecified: Secondary | ICD-10-CM | POA: Diagnosis not present

## 2016-04-29 NOTE — Progress Notes (Signed)
Patient presents to clinic today c/o 2 days of burning throat with some nausea and fatigue. Has history of oral thrush and is worried this is the cause of her symptoms. Endorses the burning resolved last night and has not returned so far today.  Denies fever, chills, malaise. Has noted some R ear pressure and PND. Denies R ear pain. Has just restarted her Zyrtec for allergy symptoms.   Past Medical History  Diagnosis Date  . Anxiety   . Arthritis   . Depression   . 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 Rotator cuff tear   . Medicare annual wellness visit, subsequent 10/02/2015  . Cancer ([place])   . Welcome to Medicare preventive visit 10/02/2015  . 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 Anemia 01/13/2016  . Complex regional pain syndrome 01/01/2016  . 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    Has been seeing ortho and acupuncture in past   . Multiple allergies 03/31/2016    Current Outpatient Prescriptions on File Prior to Visit  Medication Sig Dispense Refill  . Ascorbic Acid (VITAMIN C) 1000 MG tablet Take 1,000 mg by mouth daily.    Marland Kitchen buPROPion (WELLBUTRIN) 75 MG tablet Take 1/2 by mouth daily 30 tablet 5  . Calcium Carbonate-Vit D-Min (CALCIUM 1200 PO) Take 1 tablet by mouth 2 (two) times daily.    . cetirizine (ZYRTEC) 10 MG tablet Take 10 mg by mouth daily as needed.     . chlorhexidine (PERIDEX) 0.12 % solution Use as directed 5 mLs in the mouth or throat at bedtime.     . clonazePAM (KLONOPIN) 1 MG tablet TAKE ONE TABLET BY MOUTH THREE TIMES DAILY AS NEEDED FOR ANXIETY (Patient taking differently: take 1 and 1/2 at night and 1/2 in the morning) 90 tablet 0  . Cyanocobalamin (VITAMIN B 12 PO) Take 500 mg by mouth 2 (two) times daily.    Marland Kitchen docusate sodium (COLACE) 100 MG capsule Take 100 mg by mouth 2 (two) times daily.    Marland Kitchen gabapentin (NEURONTIN) 300 MG capsule 1 tab po bid and 2 tabs po qhs (Patient taking differently: 1 tab po midday and 2 tabs po qhs) 120 capsule 3  . HYDROmorphone (DILAUDID) 2 MG  tablet Take 2 mg by mouth every 6 (six) hours as needed for severe pain.     Marland Kitchen ibuprofen (ADVIL,MOTRIN) 800 MG tablet Take 800 mg by mouth 3 (three) times daily.    . methocarbamol (ROBAXIN) 500 MG tablet Take 500 mg by mouth every 6 (six) hours as needed for muscle spasms.    . mometasone (ELOCON) 0.1 % ointment Apply topically daily. As needed 15 g 0  . pantoprazole (PROTONIX) 40 MG tablet TAKE ONE TABLET BY MOUTH ONCE DAILY 90 tablet 2  . Probiotic Product (PROBIOTIC DAILY PO) Take 1 tablet by mouth daily. NOW 10    . TURMERIC PO Take 894 mg by mouth. GAIA brand-Take 2 in the morning and 2 at night.     No current facility-administered medications on file prior to visit.    Allergies  Allergen Reactions  . Aspirin Anaphylaxis and Swelling    Swelling of lips and fingers   . Celebrex [Celecoxib] Anaphylaxis, Hives and Shortness Of Breath  . Doxycycline Other (See Comments)    Scared throat   . Prednisone Shortness Of Breath and Other (See Comments)    Chest tightness   . Celexa [Citalopram] Other (See Comments)    Shakiness   . Fosamax [Alendronate Sodium] Hives  .  Clindamycin/Lincomycin Nausea And Vomiting  . Other Hives and Nausea Only    Arthritec    Family History  Problem Relation Age of Onset  . Heart disease Mother   . Arthritis Mother   . Heart disease Father   . COPD Father   . Mental illness Brother     bipolar  . Hypertension Brother   . Suicidality Brother   . Alcohol abuse Brother   . Heart disease Maternal Aunt   . Heart disease Maternal Uncle   . Heart disease Paternal Aunt   . COPD Paternal 76   . Heart disease Paternal Uncle   . Heart disease Maternal Grandmother   . Heart disease Maternal Grandfather   . Heart disease Paternal Grandmother   . Heart disease Paternal Grandfather     Social History   Social History  . Marital Status: Single    Spouse Name: N/A  . Number of Children: N/A  . Years of Education: N/A   Social History Main  Topics  . Smoking status: Never Smoker   . Smokeless tobacco: Never Used  . Alcohol Use: 0.6 oz/week    1 Glasses of wine per week     Comment: per month  . Drug Use: No  . Sexual Activity: No     Comment: lives alone, retired from [employer], no dietary restrictions   Other Topics Concern  . None   Social History Narrative    Review of Systems - See HPI.  All other ROS are negative.  BP 122/86 mmHg  Pulse 71  Temp(Src) 98 F (36.7 C) (Oral)  Resp 16  Ht 5' (1.524 m)  Wt 129 lb 2 oz (58.571 kg)  BMI 25.22 kg/m2  SpO2 98%  Physical Exam  Constitutional: She is oriented to person, place, and time and well-developed, well-nourished, and in no distress.  HENT:  Head: Normocephalic and atraumatic.  Right Ear: Ear canal normal. A middle ear effusion is present.  Left Ear: Tympanic membrane normal.  Nose: Nose normal. Right sinus exhibits no maxillary sinus tenderness and no frontal sinus tenderness. Left sinus exhibits no maxillary sinus tenderness and no frontal sinus tenderness.  Mouth/Throat: Uvula is midline, oropharynx is clear and moist and mucous membranes are normal.  Eyes: Conjunctivae are normal.  Cardiovascular: Normal rate, regular rhythm, normal heart sounds and intact distal pulses.   Pulmonary/Chest: Effort normal and breath sounds normal. No respiratory distress. She has no wheezes. She has no rales. She exhibits no tenderness.  Neurological: She is alert and oriented to person, place, and time.  Skin: Skin is warm and dry. No rash noted.  Psychiatric: Affect normal.  Vitals reviewed.   Recent Results (from the past 2160 hour(s))  CBC     Status: Abnormal   Collection Time: 03/31/16 10:25 AM  Result Value Ref Range   WBC 4.7 4.0 - 10.5 K/uL   RBC 3.76 (L) 3.87 - 5.11 Mil/uL   Platelets 204.0 150.0 - 400.0 K/uL   Hemoglobin 11.1 (L) 12.0 - 15.0 g/dL   HCT 33.0 (L) 36.0 - 46.0 %   MCV 87.9 78.0 - 100.0 fl   MCHC 33.5 30.0 - 36.0 g/dL   RDW 13.4  11.5 - 15.5 %  TSH     Status: None   Collection Time: 03/31/16 10:25 AM  Result Value Ref Range   TSH 3.14 0.35 - 4.50 uIU/mL  Comprehensive metabolic panel     Status: None   Collection Time: 03/31/16 10:25 AM  Result Value Ref Range   Sodium 138 135 - 145 mEq/L   Potassium 4.2 3.5 - 5.1 mEq/L   Chloride 104 96 - 112 mEq/L   CO2 27 19 - 32 mEq/L   Glucose, Bld 84 70 - 99 mg/dL   BUN 22 6 - 23 mg/dL   Creatinine, Ser 0.89 0.40 - 1.20 mg/dL   Total Bilirubin 0.4 0.2 - 1.2 mg/dL   Alkaline Phosphatase 42 39 - 117 U/L   AST 15 0 - 37 U/L   ALT 14 0 - 35 U/L   Total Protein 6.6 6.0 - 8.3 g/dL   Albumin 4.2 3.5 - 5.2 g/dL   Calcium 9.4 8.4 - 10.5 mg/dL   GFR 67.51 >60.00 mL/min  Lipid panel     Status: Abnormal   Collection Time: 03/31/16 10:25 AM  Result Value Ref Range   Cholesterol 219 (H) 0 - 200 mg/dL    Comment: ATP III Classification       Desirable:  < 200 mg/dL               Borderline High:  200 - 239 mg/dL          High:  > = 240 mg/dL   Triglycerides 131.0 0.0 - 149.0 mg/dL    Comment: Normal:  <150 mg/dLBorderline High:  150 - 199 mg/dL   HDL 72.40 >39.00 mg/dL   VLDL 26.2 0.0 - 40.0 mg/dL   LDL Cholesterol 121 (H) 0 - 99 mg/dL   Total CHOL/HDL Ratio 3     Comment:                Men          Women1/2 Average Risk     3.4          3.3Average Risk          5.0          4.42X Average Risk          9.6          7.13X Average Risk          15.0          11.0                       NonHDL 147.08     Comment: NOTE:  Non-HDL goal should be 30 mg/dL higher than patient's LDL goal (i.e. LDL goal of < 70 mg/dL, would have non-HDL goal of < 100 mg/dL)    Assessment/Plan: 1. Viral URI 1.5 days of symptoms. Sore throat has resolved. No evidence of erythema or thrush on exam. Some mild rhinorrhea and clear fluid noted behind R TM only. Continue zyrtec. Will start Flonase. Increase fluids. Humidifier in bedroom. Rest. FU if not resolving.   Leeanne Rio, PA-C

## 2016-04-29 NOTE — Progress Notes (Signed)
Pre visit review using our clinic review tool, if applicable. No additional management support is needed unless otherwise documented below in the visit note/SLS  

## 2016-04-29 NOTE — Patient Instructions (Signed)
Please stay hydrated and get plenty of rest. Take Zyrtec as directed. Start a daily Flonase over the next week. Use as directed. Place a humidifier in the bedroom. Continue your probiotic.  Follow-up if symptoms are not resolving.

## 2016-04-30 DIAGNOSIS — M7989 Other specified soft tissue disorders: Secondary | ICD-10-CM | POA: Diagnosis not present

## 2016-04-30 DIAGNOSIS — M79601 Pain in right arm: Secondary | ICD-10-CM | POA: Diagnosis not present

## 2016-04-30 DIAGNOSIS — G90511 Complex regional pain syndrome I of right upper limb: Secondary | ICD-10-CM | POA: Diagnosis not present

## 2016-05-01 ENCOUNTER — Other Ambulatory Visit: Payer: Self-pay | Admitting: Family Medicine

## 2016-05-01 MED ORDER — CLONAZEPAM 1 MG PO TABS: 1.0000 mg | ORAL_TABLET | Freq: Three times a day (TID) | ORAL | Status: DC | PRN

## 2016-05-06 DIAGNOSIS — M79601 Pain in right arm: Secondary | ICD-10-CM | POA: Diagnosis not present

## 2016-05-06 DIAGNOSIS — M7989 Other specified soft tissue disorders: Secondary | ICD-10-CM | POA: Diagnosis not present

## 2016-05-08 DIAGNOSIS — M7989 Other specified soft tissue disorders: Secondary | ICD-10-CM | POA: Diagnosis not present

## 2016-05-08 DIAGNOSIS — M79601 Pain in right arm: Secondary | ICD-10-CM | POA: Diagnosis not present

## 2016-05-12 DIAGNOSIS — M7989 Other specified soft tissue disorders: Secondary | ICD-10-CM | POA: Diagnosis not present

## 2016-05-12 DIAGNOSIS — M79601 Pain in right arm: Secondary | ICD-10-CM | POA: Diagnosis not present

## 2016-05-15 DIAGNOSIS — M7989 Other specified soft tissue disorders: Secondary | ICD-10-CM | POA: Diagnosis not present

## 2016-05-15 DIAGNOSIS — M79601 Pain in right arm: Secondary | ICD-10-CM | POA: Diagnosis not present

## 2016-05-15 DIAGNOSIS — G90511 Complex regional pain syndrome I of right upper limb: Secondary | ICD-10-CM | POA: Diagnosis not present

## 2016-05-16 DIAGNOSIS — S46011D Strain of muscle(s) and tendon(s) of the rotator cuff of right shoulder, subsequent encounter: Secondary | ICD-10-CM | POA: Diagnosis not present

## 2016-05-16 DIAGNOSIS — Z9889 Other specified postprocedural states: Secondary | ICD-10-CM | POA: Diagnosis not present

## 2016-05-20 DIAGNOSIS — M79601 Pain in right arm: Secondary | ICD-10-CM | POA: Diagnosis not present

## 2016-05-20 DIAGNOSIS — M7989 Other specified soft tissue disorders: Secondary | ICD-10-CM | POA: Diagnosis not present

## 2016-05-23 DIAGNOSIS — G90511 Complex regional pain syndrome I of right upper limb: Secondary | ICD-10-CM | POA: Diagnosis not present

## 2016-05-23 DIAGNOSIS — M7989 Other specified soft tissue disorders: Secondary | ICD-10-CM | POA: Diagnosis not present

## 2016-05-23 DIAGNOSIS — M79601 Pain in right arm: Secondary | ICD-10-CM | POA: Diagnosis not present

## 2016-05-27 DIAGNOSIS — M79601 Pain in right arm: Secondary | ICD-10-CM | POA: Diagnosis not present

## 2016-05-27 DIAGNOSIS — M7989 Other specified soft tissue disorders: Secondary | ICD-10-CM | POA: Diagnosis not present

## 2016-05-28 ENCOUNTER — Encounter: Payer: Self-pay | Admitting: Family Medicine

## 2016-05-29 DIAGNOSIS — M79601 Pain in right arm: Secondary | ICD-10-CM | POA: Diagnosis not present

## 2016-05-29 DIAGNOSIS — M7989 Other specified soft tissue disorders: Secondary | ICD-10-CM | POA: Diagnosis not present

## 2016-06-03 DIAGNOSIS — M7989 Other specified soft tissue disorders: Secondary | ICD-10-CM | POA: Diagnosis not present

## 2016-06-03 DIAGNOSIS — M79601 Pain in right arm: Secondary | ICD-10-CM | POA: Diagnosis not present

## 2016-06-05 DIAGNOSIS — G90511 Complex regional pain syndrome I of right upper limb: Secondary | ICD-10-CM | POA: Diagnosis not present

## 2016-06-05 DIAGNOSIS — M79601 Pain in right arm: Secondary | ICD-10-CM | POA: Diagnosis not present

## 2016-06-09 DIAGNOSIS — M79601 Pain in right arm: Secondary | ICD-10-CM | POA: Diagnosis not present

## 2016-06-12 DIAGNOSIS — M79601 Pain in right arm: Secondary | ICD-10-CM | POA: Diagnosis not present

## 2016-06-17 DIAGNOSIS — M79601 Pain in right arm: Secondary | ICD-10-CM | POA: Diagnosis not present

## 2016-06-19 DIAGNOSIS — M79601 Pain in right arm: Secondary | ICD-10-CM | POA: Diagnosis not present

## 2016-06-24 DIAGNOSIS — G90511 Complex regional pain syndrome I of right upper limb: Secondary | ICD-10-CM | POA: Diagnosis not present

## 2016-06-24 DIAGNOSIS — M79601 Pain in right arm: Secondary | ICD-10-CM | POA: Diagnosis not present

## 2016-06-26 DIAGNOSIS — M79601 Pain in right arm: Secondary | ICD-10-CM | POA: Diagnosis not present

## 2016-07-01 DIAGNOSIS — M79601 Pain in right arm: Secondary | ICD-10-CM | POA: Diagnosis not present

## 2016-07-03 DIAGNOSIS — M79601 Pain in right arm: Secondary | ICD-10-CM | POA: Diagnosis not present

## 2016-07-08 DIAGNOSIS — M79601 Pain in right arm: Secondary | ICD-10-CM | POA: Diagnosis not present

## 2016-07-10 DIAGNOSIS — M79601 Pain in right arm: Secondary | ICD-10-CM | POA: Diagnosis not present

## 2016-07-13 ENCOUNTER — Other Ambulatory Visit: Payer: Self-pay | Admitting: Family Medicine

## 2016-07-15 ENCOUNTER — Other Ambulatory Visit: Payer: Self-pay | Admitting: Family Medicine

## 2016-07-15 DIAGNOSIS — M79601 Pain in right arm: Secondary | ICD-10-CM | POA: Diagnosis not present

## 2016-07-15 MED ORDER — CLONAZEPAM 1 MG PO TABS: 1.0000 mg | ORAL_TABLET | Freq: Three times a day (TID) | ORAL | 0 refills | Status: DC | PRN

## 2016-07-15 NOTE — Telephone Encounter (Signed)
Faxed hardcopy for Clonazepam to [facility] [place] 

## 2016-07-17 DIAGNOSIS — G90511 Complex regional pain syndrome I of right upper limb: Secondary | ICD-10-CM | POA: Diagnosis not present

## 2016-07-17 DIAGNOSIS — M79601 Pain in right arm: Secondary | ICD-10-CM | POA: Diagnosis not present

## 2016-07-21 DIAGNOSIS — M79601 Pain in right arm: Secondary | ICD-10-CM | POA: Diagnosis not present

## 2016-07-23 DIAGNOSIS — M79601 Pain in right arm: Secondary | ICD-10-CM | POA: Diagnosis not present

## 2016-07-23 DIAGNOSIS — G90511 Complex regional pain syndrome I of right upper limb: Secondary | ICD-10-CM | POA: Diagnosis not present

## 2016-07-28 DIAGNOSIS — M79601 Pain in right arm: Secondary | ICD-10-CM | POA: Diagnosis not present

## 2016-07-30 DIAGNOSIS — G90511 Complex regional pain syndrome I of right upper limb: Secondary | ICD-10-CM | POA: Diagnosis not present

## 2016-07-30 DIAGNOSIS — M79601 Pain in right arm: Secondary | ICD-10-CM | POA: Diagnosis not present

## 2016-08-05 DIAGNOSIS — M79601 Pain in right arm: Secondary | ICD-10-CM | POA: Diagnosis not present

## 2016-08-07 DIAGNOSIS — M79601 Pain in right arm: Secondary | ICD-10-CM | POA: Diagnosis not present

## 2016-08-07 DIAGNOSIS — G90511 Complex regional pain syndrome I of right upper limb: Secondary | ICD-10-CM | POA: Diagnosis not present

## 2016-08-12 DIAGNOSIS — M79601 Pain in right arm: Secondary | ICD-10-CM | POA: Diagnosis not present

## 2016-08-14 DIAGNOSIS — M79601 Pain in right arm: Secondary | ICD-10-CM | POA: Diagnosis not present

## 2016-08-14 DIAGNOSIS — G90511 Complex regional pain syndrome I of right upper limb: Secondary | ICD-10-CM | POA: Diagnosis not present

## 2016-08-19 DIAGNOSIS — M79601 Pain in right arm: Secondary | ICD-10-CM | POA: Diagnosis not present

## 2016-08-19 DIAGNOSIS — Z9889 Other specified postprocedural states: Secondary | ICD-10-CM | POA: Diagnosis not present

## 2016-08-19 DIAGNOSIS — G5641 Causalgia of right upper limb: Secondary | ICD-10-CM | POA: Diagnosis not present

## 2016-08-20 ENCOUNTER — Encounter: Payer: Self-pay | Admitting: Family Medicine

## 2016-08-21 DIAGNOSIS — G90511 Complex regional pain syndrome I of right upper limb: Secondary | ICD-10-CM | POA: Diagnosis not present

## 2016-08-21 DIAGNOSIS — M79601 Pain in right arm: Secondary | ICD-10-CM | POA: Diagnosis not present

## 2016-08-26 DIAGNOSIS — M79601 Pain in right arm: Secondary | ICD-10-CM | POA: Diagnosis not present

## 2016-08-28 DIAGNOSIS — M79601 Pain in right arm: Secondary | ICD-10-CM | POA: Diagnosis not present

## 2016-09-02 DIAGNOSIS — M79601 Pain in right arm: Secondary | ICD-10-CM | POA: Diagnosis not present

## 2016-09-04 ENCOUNTER — Ambulatory Visit
Admission: RE | Admit: 2016-09-04 | Discharge: 2016-09-04 | Disposition: A | Discharge status: Home / Self Care | Payer: Medicare HMO | Source: Ambulatory Visit | Attending: Pain Medicine | Admitting: Pain Medicine

## 2016-09-04 ENCOUNTER — Ambulatory Visit: Payer: Medicare HMO | Attending: Pain Medicine | Admitting: Pain Medicine

## 2016-09-04 ENCOUNTER — Encounter: Payer: Self-pay | Admitting: Pain Medicine

## 2016-09-04 VITALS — BP 105/91 | HR 74 | Temp 98.6°F | Resp 16 | Ht 59.0 in | Wt 130.0 lb

## 2016-09-04 DIAGNOSIS — M4722 Other spondylosis with radiculopathy, cervical region: Secondary | ICD-10-CM | POA: Insufficient documentation

## 2016-09-04 DIAGNOSIS — X58XXXS Exposure to other specified factors, sequela: Secondary | ICD-10-CM | POA: Diagnosis not present

## 2016-09-04 DIAGNOSIS — M659 Synovitis and tenosynovitis, unspecified: Secondary | ICD-10-CM | POA: Insufficient documentation

## 2016-09-04 DIAGNOSIS — M5010 Cervical disc disorder with radiculopathy, unspecified cervical region: Secondary | ICD-10-CM | POA: Insufficient documentation

## 2016-09-04 DIAGNOSIS — M50322 Other cervical disc degeneration at C5-C6 level: Secondary | ICD-10-CM | POA: Insufficient documentation

## 2016-09-04 DIAGNOSIS — G4700 Insomnia, unspecified: Secondary | ICD-10-CM | POA: Insufficient documentation

## 2016-09-04 DIAGNOSIS — G8929 Other chronic pain: Secondary | ICD-10-CM | POA: Insufficient documentation

## 2016-09-04 DIAGNOSIS — G8918 Other acute postprocedural pain: Secondary | ICD-10-CM | POA: Diagnosis not present

## 2016-09-04 DIAGNOSIS — Z9889 Other specified postprocedural states: Secondary | ICD-10-CM | POA: Diagnosis not present

## 2016-09-04 DIAGNOSIS — K219 Gastro-esophageal reflux disease without esophagitis: Secondary | ICD-10-CM | POA: Insufficient documentation

## 2016-09-04 DIAGNOSIS — Z79899 Other long term (current) drug therapy: Secondary | ICD-10-CM | POA: Diagnosis not present

## 2016-09-04 DIAGNOSIS — G90511 Complex regional pain syndrome I of right upper limb: Secondary | ICD-10-CM | POA: Insufficient documentation

## 2016-09-04 DIAGNOSIS — M79621 Pain in right upper arm: Secondary | ICD-10-CM | POA: Insufficient documentation

## 2016-09-04 DIAGNOSIS — X58XXXA Exposure to other specified factors, initial encounter: Secondary | ICD-10-CM | POA: Insufficient documentation

## 2016-09-04 DIAGNOSIS — M79641 Pain in right hand: Secondary | ICD-10-CM | POA: Diagnosis not present

## 2016-09-04 DIAGNOSIS — M792 Neuralgia and neuritis, unspecified: Secondary | ICD-10-CM | POA: Insufficient documentation

## 2016-09-04 DIAGNOSIS — M7500 Adhesive capsulitis of unspecified shoulder: Secondary | ICD-10-CM | POA: Diagnosis not present

## 2016-09-04 DIAGNOSIS — M8580 Other specified disorders of bone density and structure, unspecified site: Secondary | ICD-10-CM | POA: Diagnosis not present

## 2016-09-04 DIAGNOSIS — F418 Other specified anxiety disorders: Secondary | ICD-10-CM | POA: Insufficient documentation

## 2016-09-04 DIAGNOSIS — G5691 Unspecified mononeuropathy of right upper limb: Secondary | ICD-10-CM

## 2016-09-04 DIAGNOSIS — M5030 Other cervical disc degeneration, unspecified cervical region: Secondary | ICD-10-CM

## 2016-09-04 DIAGNOSIS — M7551 Bursitis of right shoulder: Secondary | ICD-10-CM | POA: Diagnosis not present

## 2016-09-04 DIAGNOSIS — M779 Enthesopathy, unspecified: Secondary | ICD-10-CM | POA: Diagnosis not present

## 2016-09-04 DIAGNOSIS — Z86010 Personal history of colonic polyps: Secondary | ICD-10-CM | POA: Insufficient documentation

## 2016-09-04 DIAGNOSIS — M19011 Primary osteoarthritis, right shoulder: Secondary | ICD-10-CM

## 2016-09-04 DIAGNOSIS — L309 Dermatitis, unspecified: Secondary | ICD-10-CM | POA: Insufficient documentation

## 2016-09-04 DIAGNOSIS — S46811S Strain of other muscles, fascia and tendons at shoulder and upper arm level, right arm, sequela: Secondary | ICD-10-CM

## 2016-09-04 DIAGNOSIS — M50222 Other cervical disc displacement at C5-C6 level: Secondary | ICD-10-CM | POA: Diagnosis not present

## 2016-09-04 DIAGNOSIS — E785 Hyperlipidemia, unspecified: Secondary | ICD-10-CM | POA: Diagnosis not present

## 2016-09-04 DIAGNOSIS — R937 Abnormal findings on diagnostic imaging of other parts of musculoskeletal system: Secondary | ICD-10-CM

## 2016-09-04 DIAGNOSIS — D649 Anemia, unspecified: Secondary | ICD-10-CM | POA: Insufficient documentation

## 2016-09-04 DIAGNOSIS — S46911S Strain of unspecified muscle, fascia and tendon at shoulder and upper arm level, right arm, sequela: Secondary | ICD-10-CM | POA: Diagnosis not present

## 2016-09-04 DIAGNOSIS — M25511 Pain in right shoulder: Secondary | ICD-10-CM

## 2016-09-04 DIAGNOSIS — M79601 Pain in right arm: Secondary | ICD-10-CM

## 2016-09-04 DIAGNOSIS — M4802 Spinal stenosis, cervical region: Secondary | ICD-10-CM | POA: Insufficient documentation

## 2016-09-04 DIAGNOSIS — S43431S Superior glenoid labrum lesion of right shoulder, sequela: Secondary | ICD-10-CM

## 2016-09-04 DIAGNOSIS — M47812 Spondylosis without myelopathy or radiculopathy, cervical region: Secondary | ICD-10-CM

## 2016-09-04 DIAGNOSIS — R936 Abnormal findings on diagnostic imaging of limbs: Secondary | ICD-10-CM

## 2016-09-04 DIAGNOSIS — S4991XA Unspecified injury of right shoulder and upper arm, initial encounter: Secondary | ICD-10-CM | POA: Insufficient documentation

## 2016-09-04 DIAGNOSIS — R938 Abnormal findings on diagnostic imaging of other specified body structures: Secondary | ICD-10-CM

## 2016-09-04 DIAGNOSIS — IMO0001 Reserved for inherently not codable concepts without codable children: Secondary | ICD-10-CM

## 2016-09-04 DIAGNOSIS — M12211 Villonodular synovitis (pigmented), right shoulder: Secondary | ICD-10-CM

## 2016-09-04 DIAGNOSIS — M67921 Unspecified disorder of synovium and tendon, right upper arm: Secondary | ICD-10-CM | POA: Insufficient documentation

## 2016-09-04 NOTE — Progress Notes (Signed)
Safety precautions to be maintained throughout the outpatient stay will include: orient to surroundings, keep bed in low position, maintain call bell within reach at all times, provide assistance with transfer out of bed and ambulation.  

## 2016-09-04 NOTE — Progress Notes (Signed)
Patient's Name: Stephanie Barnes  MRN: [id number]  Referring Provider: Carroll Kinds, MD  DOB: [birth year]  PCP: Penni Homans, MD  DOS: 09/04/2016  Note by: Kathlen Brunswick. Dossie Arbour, MD  Service setting: Ambulatory outpatient  Specialty: Interventional Pain Management  Location: [facility] (AMB) Pain Management Facility    Patient type: New Patient   Primary Reason(s) for Visit: Initial Patient Evaluation CC: Shoulder Pain (right post op  pain); Arm Pain (upper arm  right); and Hand Pain (right)  HPI  [name] is a 65 y.o. year old, female patient, who comes today for an initial evaluation. She has Depression with anxiety; DJD (degenerative joint disease); GERD (gastroesophageal reflux disease); Eczema; Tendinitis of hand; Insomnia; Hyperlipidemia; Osteopenia; Colon polyp   tubular adenoma  03/2014; Cervical disc disorder with radiculopathy of cervical region; Frozen shoulder; Welcome to Medicare preventive visit; Complex regional pain syndrome type I (Location of Primary Source of Pain) (upper extremity) (Right); Anemia; Multiple allergies; Chronic shoulder pain (Location of Primary Source of Pain) (Right); Chronic upper extremity pain (Right); Chronic upper arm pain (Right); Chronic hand pain (Right); Neuropathic pain of shoulder (Right); Spondylosis of cervical 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 Her primarily concern today is the Shoulder Pain (right post op  pain); Arm Pain (upper arm  right); and Hand Pain  (right)  Pain Assessment: Self-Reported Pain Score: 3 /10             Reported level is compatible with observation.       Pain Type: Chronic pain Pain Location: (S) Shoulder (right shoulder sx in Dec 2016; pain goes down upper arm and the right hand has tingling and  sometimes burning pain) Pain Orientation: Right Pain Descriptors / Indicators: Tightness Pain Frequency: Intermittent  Onset and Duration: Gradual, Started with accident and Date of onset: 15 months ago Cause of pain: Trauma to the shoulder region Severity: Getting worse, NAS-11 at its worse: 8/10, NAS-11 at its best: 3/10 and NAS-11 on the average: 6/10 Timing: Morning, Night and After activity or exercise Aggravating Factors: Lifiting, Motion and Surgery made it worse Alleviating Factors: Acupuncture, Cold packs, Medications, Nerve blocks, Sleeping and Warm showers or baths Associated Problems: Swelling, Temperature changes, Tingling and Pain that wakes patient up Quality of Pain: Aching, Burning, Feeling of constriction, Tingling and Uncomfortable Previous Examinations or Tests: MRI scan, Nerve block, X-rays and Orthoperdic evaluation Previous Treatments: Narcotic medications and Physical Therapy  The patient comes into the clinics today for the first time for a chronic pain management evaluation. According to the patient she has had a total of 22 stellate ganglion blocks. She denies any bier blocks. She indicates that the stellate ganglion blocks helps with physical therapy. She indicates that for the past 10 months she has been going to physical therapy twice a week. In addition, she does therapy every day at home. She indicates that the biggest problem is that she tends to do more than she supposed to. The primary pain is in the area of the right shoulder and hand. She had some shoulder surgery and son thereafter she began to experience swelling of the upper extremity from the shoulder  down. This was diagnosed as a complex  regional pain syndrome type I. The patient indicates being allergic to steroids and aspirin with prednisone having caused chest tightness and closing of her throat.  Today I took the time to provide the patient with information regarding my pain practice. The patient was informed that my practice is divided into two sections: an interventional pain management section, as well as a completely separate and distinct medication management section. The interventional portion of my practice takes place on Tuesdays and Thursdays, while the medication management is conducted on Mondays and Wednesdays. Because of the amount of documentation required on both them, they are kept separated. This means that there is the possibility that the patient may be scheduled for a procedure on Tuesday, while also having a medication management appointment on Wednesday. I have also informed the patient that because of current staffing and facility limitations, I no longer take patients for medication management only. To illustrate the reasons for this, I gave the patient the example of a surgeon and how inappropriate it would be to refer a patient to his/her practice so that they write for the post-procedure antibiotics on a surgery done by someone else.   The patient was informed that joining my practice means that they are open to any and all interventional therapies. I clarified for the patient that this does not mean that they will be forced to have any procedures done. What it means is that patients looking for a practitioner to simply write for their pain medications and not take advantage of other interventional techniques will be better served by a different practitioner, other than myself. I made it clear that I prefer to spend my time providing those services that I specialize in.  The patient was also made aware of my Comprehensive Pain Management Safety Guidelines where by joining my practice, they limit all of their  nerve blocks and joint injections to those done by our practice, for as long as we are retained to manage their care.   Historic Controlled Substance Pharmacotherapy Review  PMP and historical list of controlled substances: According to the [place], the patient has been on clonazepam since 12/02/2012. She has also used tramadol 50 mg, and hydromorphone 2 mg. Highest analgesic regimen found: Hydromorphone 2 mg 2 tablets every 6 hours (16 mg/day of hydromorphone) Most recent analgesic: Hydromorphone 2 mg every 6 hours + clonazepam 1 mg 3 times a day. Highest recorded MME/day: 68.57 mg/day MME/day: 32 mg/day Medications: The patient did not bring the medication(s) to the appointment, as requested in our "New Patient Package" Pharmacodynamics: Desired effects: Analgesia: The patient reports >50% benefit. Reported improvement in function: The patient reports medication allows her to accomplish basic ADLs. Clinically meaningful improvement in function (CMIF): Sustained CMIF goals met Perceived effectiveness: Described as relatively effective, allowing for increase in activities of daily living (ADL) Undesirable effects: Side-effects or Adverse reactions: None reported Historical Monitoring: The patient  reports that she does not use drugs.. No results found for: MDMA, COCAINSCRNUR, PCPSCRNUR, THCU, ETH Historical Background Evaluation: [place] PDMP: Five (5) year initial data search conducted. No abnormal patterns identified [place] Department of public safety, offender search: Editor, commissioning Information) Non-contributory Risk Assessment Profile: Aberrant behavior: None observed or detected today Risk factors for fatal opioid overdose: None identified today Fatal overdose hazard ratio (HR): Calculation deferred Non-fatal overdose hazard ratio (HR): Calculation deferred Risk of opioid abuse or dependence: 0.7-3.0% with doses ? 36 MME/day and 6.1-26%  with doses ?  120 MME/day. Substance use disorder (SUD) risk level: Pending results of Medical Psychology Evaluation for SUD Opioid risk tool (ORT) (Total Score): 3  ORT Scoring interpretation table:  Score <3 = Low Risk for SUD  Score between 4-7 = Moderate Risk for SUD  Score >8 = High Risk for Opioid Abuse   PHQ-2 Depression Scale:  Total score: 0  PHQ-2 Scoring interpretation table: (Score and probability of major depressive disorder)  Score 0 = No depression  Score 1 = 15.4% Probability  Score 2 = 21.1% Probability  Score 3 = 38.4% Probability  Score 4 = 45.5% Probability  Score 5 = 56.4% Probability  Score 6 = 78.6% Probability   PHQ-9 Depression Scale:  Total score: 0  PHQ-9 Scoring interpretation table:  Score 0-4 = No depression  Score 5-9 = Mild depression  Score 10-14 = Moderate depression  Score 15-19 = Moderately severe depression  Score 20-27 = Severe depression (2.4 times higher risk of SUD and 2.89 times higher risk of overuse)   Pharmacologic Plan: Pending ordered tests and/or consults  Meds  The patient has a current medication list which includes the following prescription(s): acetaminophen, acetaminophen, vitamin c, bupropion, calcium carbonate-vit d-min, cetirizine, chlorhexidine, clonazepam, cyanocobalamin, docusate sodium, echinacea, gabapentin, hydromorphone, ibuprofen, methocarbamol, mometasone, omega-3 fatty acids, pantoprazole, pennsaid, probiotic product, and turmeric.  Current Outpatient Prescriptions on File Prior to Visit  Medication Sig  . Ascorbic Acid (VITAMIN C) 1000 MG tablet Take 1,000 mg by mouth daily.  Marland Kitchen buPROPion (WELLBUTRIN) 75 MG tablet Take 1/2 by mouth daily (Patient taking differently: 75 mg. Take 1/2 by mouth daily)  . Calcium Carbonate-Vit D-Min (CALCIUM 1200 PO) Take 1 tablet by mouth 2 (two) times daily.  . cetirizine (ZYRTEC) 10 MG tablet Take 10 mg by mouth daily as needed.   . chlorhexidine (PERIDEX) 0.12 % solution Use as directed 5 mLs  in the mouth or throat at bedtime.   . clonazePAM (KLONOPIN) 1 MG tablet Take 1 tablet (1 mg total) by mouth 3 (three) times daily as needed. for anxiety (Patient taking differently: Take 1 mg by mouth at bedtime. for anxiety)  . Cyanocobalamin (VITAMIN B 12 PO) Take 1,000 mg by mouth daily.   Marland Kitchen docusate sodium (COLACE) 100 MG capsule Take 100 mg by mouth as needed.   . gabapentin (NEURONTIN) 300 MG capsule 1 tab po bid and 2 tabs po qhs (Patient taking differently: 1 tab po midday and 2 tabs po qhs)  . HYDROmorphone (DILAUDID) 2 MG tablet Take 2 mg by mouth every 6 (six) hours as needed for severe pain.   . methocarbamol (ROBAXIN) 500 MG tablet Take 500 mg by mouth every 6 (six) hours as needed for muscle spasms. Takes one in morning and one at night  . mometasone (ELOCON) 0.1 % ointment Apply topically daily. As needed  . pantoprazole (PROTONIX) 40 MG tablet TAKE ONE TABLET BY MOUTH ONCE DAILY  . Probiotic Product (PROBIOTIC DAILY PO) Take 1 tablet by mouth daily. NOW 10  . TURMERIC PO Take 894 mg by mouth. GAIA brand-Take 1 in the morning and 1 at night.   No current facility-administered medications on file prior to visit.    Imaging Review  Cervical Imaging: Cervical MR wo contrast:  Results for orders placed in visit on 07/23/15  MR Cervical Spine Wo Contrast   Narrative CLINICAL DATA:  65 year old female with severe neck pain extending to right shoulder and arm for the past 2 months.  Weakness right arm. No numbness. Limited range of motion. No known injury. Initial encounter.  EXAM: MRI CERVICAL SPINE WITHOUT CONTRAST  TECHNIQUE: Multiplanar, multisequence MR imaging of the cervical spine was performed. No intravenous contrast was administered.  COMPARISON:  New  FINDINGS: Cervical medullary junction unremarkable. Nonspecific white matter type changes of the pons most likely related to result of small vessel disease.  Mild motion artifact without discrete cervical cord  signal abnormality.  Visualized paravertebral structures unremarkable.  C2-3:  Negative.  C3-4: Minimal right posterior lateral disc osteophyte with slight impression right ventral aspect of the thecal sac and minimal narrowing origin the right neural foramen.  C4-5: Shallow broad-based disc osteophyte complex greater to the right. Mild narrowing ventral aspect of the thecal sac greater on right. Very mild narrowing origin of the right neural foramen. New  C5-6: Slightly lobulated broad-based disc osteophyte complex. Narrowing of the ventral aspect of the thecal sac and mild narrowing origin of the right neural foramen.  C6-7: Tiny right paracentral/posterior lateral disc osteophyte with minimal impression upon the ventral thecal sac.  C7-T1:  Minimal bulge.  Minimal bulge slightly greater to the right.  T1-2:  Minimal anterior slip T1.  Minimal bulge.  IMPRESSION: Minimal to mild cervical spondylotic changes most prominent C5-6 level. Summary of pertinent findings includes:  C3-4 minimal right posterior lateral disc osteophyte with slight impression right ventral aspect of the thecal sac and minimal narrowing origin the right neural foramen.  C4-5 shallow broad-based disc osteophyte complex greater to the right. Mild narrowing ventral aspect of the thecal sac greater on right. Very mild narrowing origin of the right neural foramen. New  C5-6 slightly lobulated broad-based disc osteophyte complex. Narrowing of the ventral aspect of the thecal sac and mild narrowing origin of the right neural foramen.  C6-7 tiny right paracentral/posterior lateral disc osteophyte with minimal impression upon the ventral thecal sac.   Electronically Signed   By: Genia Del M.D.   On: 07/23/2015 11:38    Shoulder Imaging: Shoulder-R MR wo contrast:  Results for orders placed in visit on 08/06/15  MR Shoulder Right Wo Contrast   Narrative EXAM: MRI OF THE RIGHT SHOULDER WITHOUT  CONTRAST chronic shoulder pain. No known injury.  TECHNIQUE: Multiplanar, multisequence MR imaging of the shoulder was performed. No intravenous contrast was administered.  COMPARISON:  None.  FINDINGS: Rotator cuff: Moderate rotator cuff tendinopathy/ tendinosis. Shallow all articular surface tear involving the infraspinatus near its attachment site. There is approximately 6 mm of retraction of the articular fibers. The supraspinatus tendon also demonstrates a shallow articular surface tear at the footprint attachment site. Minimal retraction of 3 mm. There is also bursal surface irregularity which is likely fraying and fibrillation. No full thickness retracted rotator cuff tear is identified.  Muscles:  Normal.  Biceps long head: Intact. Tendinopathy involving the intra-articular portion.  Acromioclavicular Joint: Moderate AC joint degenerative changes. The acromion is type 2 in shape. Mild lateral downsloping of the acromion without definite undersurface spurring.  Glenohumeral Joint: Mild degenerative changes with small joint effusion and mild synovitis.  Labrum: Degenerative fraying type changes involving the superior labrum. Small anterior labral tears are suspected. The posterior labrum is intact.  Bones:  No acute bony findings.  Other:  Mild subacromial/subdeltoid bursitis.  IMPRESSION: Rotator cuff tendinopathy/ tendinosis with shallow articular surface tears involving the infraspinatus and supraspinatus tendons but no full thickness retracted tear.  Long head biceps tendinopathy.  Degenerative fraying type changes involving the superior labrum and  anterior labral tears are suspected.  No significant findings for bony impingement. Moderate AC joint degenerative changes and mild lateral downsloping of a type 2 acromion.  Glenohumeral joint degenerative changes with small joint effusion and mild synovitis.  Mild subacromial/subdeltoid  bursitis.   Electronically Signed   By: Marijo Sanes M.D.   On: 08/06/2015 11:03    Shoulder-R DG:  Results for orders placed during the hospital encounter of 09/13/15  DG Shoulder Right   Narrative CLINICAL DATA:  Right shoulder pain.  Shoulder injury 4 days ago.  EXAM: RIGHT SHOULDER - 2+ VIEW  COMPARISON:  MRI right shoulder 08/06/2015  FINDINGS: Negative for fracture or dislocation. Degenerative change and spurring of the [facility] joint. Mild spurring of the distal acromion. Shoulder joint appears normal.  IMPRESSION: AC degenerative change and spurring which may cause rotator cuff impingement. No acute bony abnormality.   Electronically Signed   By: Franchot Gallo M.D.   On: 09/13/2015 16:20    Note: Imaging results reviewed.  ROS  Cardiovascular History: Negative for hypertension, coronary artery diseas, myocardial infraction, anticoagulant therapy or heart failure Pulmonary or Respiratory History: Negative for bronchial asthma, emphysema, chronic smoking, chronic bronchitis, sarcoidosis, tuberculosis or sleep apena Neurological History: Negative for epilepsy, stroke, urinary or fecal inontinence, spina bifida or tethered cord syndrome Review of Past Neurological Studies: No results found for this or any previous visit. Psychological-Psychiatric History: Depression Gastrointestinal History: Reflux or heatburn Genitourinary History: Negative for nephrolithiasis, hematuria, renal failure or chronic kidney disease Hematological History: Negative for anticoagulant therapy, anemia, bruising or bleeding easily, hemophilia, sickle cell disease or trait, thrombocytopenia or coagulupathies Endocrine History: Negative for diabetes or thyroid disease Rheumatologic History: Negative for lupus, osteoarthritis, rheumatoid arthritis, myositis, polymyositis or fibromyagia Musculoskeletal History: Negative for myasthenia gravis, muscular dystrophy, multiple sclerosis or malignant  hyperthermia Work History: Retired  Allergies  Ms. Frein is allergic to aspirin; celebrex [celecoxib]; doxycycline; prednisone; celexa [citalopram]; fosamax [alendronate sodium]; clindamycin/lincomycin; and other.  Laboratory Chemistry  Inflammation Markers Lab Results  Component Value Date   ESRSEDRATE 15 12/12/2015   Renal Function Lab Results  Component Value Date   BUN 22 03/31/2016   CREATININE 0.89 03/31/2016   GFRAA >60 12/26/2015   GFRNONAA >60 12/26/2015   Hepatic Function Lab Results  Component Value Date   AST 15 03/31/2016   ALT 14 03/31/2016   ALBUMIN 4.2 03/31/2016   Electrolytes Lab Results  Component Value Date   NA 138 03/31/2016   K 4.2 03/31/2016   CL 104 03/31/2016   CALCIUM 9.4 03/31/2016   Pain Modulating Vitamins Lab Results  Component Value Date   VD25OH 51.05 10/02/2015   Coagulation Parameters Lab Results  Component Value Date   PLT 204.0 03/31/2016   Cardiovascular Lab Results  Component Value Date   HGB 11.1 (L) 03/31/2016   HCT 33.0 (L) 03/31/2016   Note: Lab results reviewed.  [place]  Drug: Ms. Maiolo  reports that she does not use drugs. Alcohol:  reports that she drinks about 0.6 oz of alcohol per week . Tobacco:  reports that she has never smoked. She has never used smokeless tobacco. 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 Family: family history includes Alcohol abuse in her brother; Arthritis in  her mother; COPD in her father and paternal aunt; Heart disease in her father, maternal aunt, maternal grandfather, maternal grandmother, maternal uncle,  mother, paternal aunt, paternal grandfather, paternal grandmother, and paternal uncle; Hypertension in her brother; Mental illness in her brother; Suicidality in her brother.  Past Surgical History:  Procedure Laterality Date  . DENTAL SURGERY     Root cleaning   . REFRACTIVE SURGERY Bilateral 2000  . ROTATOR CUFF REPAIR Right 2017  . TONSILLECTOMY     Active Ambulatory Problems    Diagnosis Date Noted  . 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 DJD (degenerative joint disease) 12/02/2012  . GERD (gastroesophageal reflux disease) 12/02/2012  . Eczema 12/02/2012  . Tendinitis of hand 12/02/2012  . Insomnia 06/09/2013  . Hyperlipidemia 06/13/2013  . Osteopenia 06/13/2013  . Colon polyp   tubular adenoma  03/2014 03/22/2014  . Cervical disc disorder with radiculopathy of cervical region 07/17/2015  . 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  . Welcome to Medicare preventive visit 10/02/2015  . Complex regional pain syndrome type I (Location of Primary Source of Pain) (upper extremity) (Right) 01/01/2016  . Anemia 01/13/2016  . Multiple allergies 03/31/2016  . Chronic shoulder pain (Location of Primary Source of Pain) (Right) 09/04/2016  . Chronic upper extremity pain (Right) 09/04/2016  . Chronic upper arm pain (Right) 09/04/2016  . Chronic hand pain (Right) 09/04/2016  . Neuropathic pain of shoulder (Right) 09/04/2016  . Spondylosis of cervical spine (C5-6 and C6-7) 09/04/2016  . Cervical DDD (degenerative disc disease) (C5-6 and C6-7) 09/04/2016  . Osteoarthritis of right AC (acromioclavicular) joint 09/04/2016  . Abnormal MRI, cervical spine (07/23/2015) 09/04/2016  . Abnormal MRI, shoulder (08/06/2015) 09/04/2016  . Subdeltoid bursitis of right shoulder joint 09/04/2016  . Subacromial bursitis of right shoulder joint 09/04/2016  . Labral tear of shoulder, right, sequela 09/04/2016  . Synovitis involving glenohumeral joint, right 09/04/2016  . Biceps tendinopathy, right 09/04/2016  .  Infraspinatus tendon tear, right, sequela 09/04/2016  . Supraspinatus tendon tear, right, sequela 09/04/2016   Resolved Ambulatory Problems    Diagnosis Date Noted  . Anxiety 12/02/2012  . Abnormal EKG 06/13/2013  . H/O bone density study 07/09/2013  . Oral thrush 03/22/2014  . Cellulitis of face 09/23/2015   Past Medical History:  Diagnosis Date  . Abnormal EKG 06/13/2013  . Anemia 01/13/2016  . Anxiety   . Arthritis   . Complex regional pain syndrome 01/01/2016  . Depression   . 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  . Medicare annual wellness visit, subsequent 10/02/2015  . Multiple allergies 03/31/2016  . Rotator cuff tear   . 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 Welcome to Medicare preventive visit 10/02/2015   Constitutional Exam  General appearance: Well nourished, well developed, and well hydrated. In no apparent acute distress Vitals:   09/04/16 1305  BP: (!) 105/91  Pulse: 74  Resp: 16  Temp: 98.6 F (37 C)  TempSrc: Oral  SpO2: 98%  Weight: 130 lb (59 kg)  Height: '4\' 11"'$  (1.499 m)   BMI Assessment: Estimated body mass index is 26.26 kg/m as calculated from the following:   Height as of this encounter: '4\' 11"'$  (1.499 m).   Weight as of this encounter: 130 lb (59 kg).  BMI interpretation table: BMI level Category Range association with higher incidence of chronic pain  <18 kg/m2 Underweight   18.5-24.9 kg/m2 Ideal body weight   25-29.9 kg/m2 Overweight Increased incidence by 20%  30-34.9 kg/m2 Obese (Class I)  Increased incidence by 68%  35-39.9 kg/m2 Severe obesity (Class II) Increased incidence by 136%  >40 kg/m2 Extreme obesity (Class III) Increased incidence by 254%   BMI Readings from Last 4 Encounters:  09/04/16 26.26 kg/m  04/29/16 25.22 kg/m  03/31/16 26.07 kg/m  01/01/16 25.10 kg/m   Wt Readings from Last 4 Encounters:  09/04/16 130 lb (59 kg)  04/29/16 129 lb 2 oz (58.6 kg)  03/31/16 133 lb 8 oz (60.6 kg)  01/01/16 128  lb 8 oz (58.3 kg)  Psych/Mental status: Alert, oriented x 3 (person, place, & time) Eyes: PERLA Respiratory: No evidence of acute respiratory distress  Cervical Spine Exam  Inspection: No masses, redness, or swelling Alignment: Symmetrical Functional ROM: Unrestricted ROM Stability: No instability detected Muscle strength & Tone: Functionally intact Sensory: Unimpaired Palpation: Non-contributory  Upper Extremity (UE) Exam    Side: Right upper extremity  Side: Left upper extremity  Inspection: Edema  Inspection: No masses, redness, swelling, or asymmetry  Functional ROM: Minimal ROM         Functional ROM: Unrestricted ROM          Muscle strength & Tone: Movement possible against gravity, but not against resistance (3/5)  Muscle strength & Tone: Functionally intact  Sensory: Sympathetically mediated, non-dermatomal pain  Sensory: Unimpaired  Palpation: Complains of area being tender to palpation  Palpation: Non-contributory   Thoracic Spine Exam  Inspection: No masses, redness, or swelling Alignment: Symmetrical Functional ROM: Unrestricted ROM Stability: No instability detected Sensory: Unimpaired Muscle strength & Tone: Functionally intact Palpation: Non-contributory  Lumbar Spine Exam  Inspection: No masses, redness, or swelling Alignment: Symmetrical Functional ROM: Unrestricted ROM Stability: No instability detected Muscle strength & Tone: Functionally intact Sensory: Unimpaired Palpation: Non-contributory Provocative Tests: Lumbar Hyperextension and rotation test: evaluation deferred today       Patrick's Maneuver: evaluation deferred today              Gait & Posture Assessment  Ambulation: Unassisted Gait: Relatively normal for age and body habitus Posture: WNL   Lower Extremity Exam    Side: Right lower extremity  Side: Left lower extremity  Inspection: No masses, redness, swelling, or asymmetry  Inspection: No masses, redness, swelling, or asymmetry   Functional ROM: Unrestricted ROM          Functional ROM: Unrestricted ROM          Muscle strength & Tone: Functionally intact  Muscle strength & Tone: Functionally intact  Sensory: Unimpaired  Sensory: Unimpaired  Palpation: Non-contributory  Palpation: Non-contributory   Assessment  Primary Diagnosis & Pertinent Problem List: The primary encounter diagnosis was Complex regional pain syndrome type 1 of right upper extremity. Diagnoses of Cervical disc disorder with radiculopathy of cervical region, Chronic shoulder pain (Location of Primary Source of Pain) (Right), Chronic upper extremity pain (Right), Chronic upper arm pain (Right), Chronic hand pain (Right), Neuropathic pain of shoulder (Right), Spondylosis of cervical region without myelopathy or radiculopathy, Cervical DDD (degenerative disc disease) (C5-6 and C6-7), Osteoarthritis of right AC (acromioclavicular) joint, Abnormal MRI, cervical spine (07/23/2015), Abnormal MRI, shoulder (08/06/2015), Subdeltoid bursitis of right shoulder joint, Subacromial bursitis of right shoulder joint, Labral tear of shoulder, right, sequela, Synovitis involving glenohumeral joint, right, Biceps tendinopathy, right, Infraspinatus tendon tear, right, sequela, and Supraspinatus tendon tear, right, sequela were also pertinent to this visit.  Visit Diagnosis: 1. Complex regional pain syndrome type 1 of right upper extremity   2. Cervical disc disorder with radiculopathy of cervical region  3. Chronic shoulder pain (Location of Primary Source of Pain) (Right)   4. Chronic upper extremity pain (Right)   5. Chronic upper arm pain (Right)   6. Chronic hand pain (Right)   7. Neuropathic pain of shoulder (Right)   8. Spondylosis of cervical region without myelopathy or radiculopathy   9. Cervical DDD (degenerative disc disease) (C5-6 and C6-7)   10. Osteoarthritis of right AC (acromioclavicular) joint   11. Abnormal MRI, cervical spine (07/23/2015)   12.  Abnormal MRI, shoulder (08/06/2015)   13. Subdeltoid bursitis of right shoulder joint   14. Subacromial bursitis of right shoulder joint   15. Labral tear of shoulder, right, sequela   16. Synovitis involving glenohumeral joint, right   17. Biceps tendinopathy, right   18. Infraspinatus tendon tear, right, sequela   19. Supraspinatus tendon tear, right, sequela    Plan of Care  Initial Treatment Plan:  Please be advised that as per protocol, today's visit has been an evaluation only. We have not taken over the patient's controlled substance management.  Problem-Specific Plan: Abnormal MRI, cervical spine (07/23/2015) Clinical Correlation: C3-4: C4 nerve root exits this level. It innervates the area of the clavicle. C4-5: C5 nerve root exits this level. It innervates the area of the deltoid and lateral upper arm.  C5-6: C6 nerve root exits this level. It innervates the radial (lateral) aspect of the forearm, as well as the thumb.  C6-7: C7 nerve root exits this level. It innervates the area of the index and middle fingers.  C7-T1: C8 nerve root exits this level. It innervates the area of the ring and pinky fingers.  T1-2: Minimal anterior slip T1.  Minimal bulge.   Ordered Lab-work, Procedure(s), Referral(s), & Consult(s): Orders Placed This Encounter  Procedures  . DG Shoulder Right  . DG Cervical Spine Complete  . CT BIOPSY   Pharmacotherapy: Medications ordered:  No orders of the defined types were placed in this encounter.  Medications administered during this visit: Ms. Better had no medications administered during this visit.   Pharmacotherapy under consideration:  Opioid Analgesics: The patient has requested that we do not evaluate her for opioid medication management since her surgeon has been taking care of her pain medications. She has indicated the [place] is too far away from her and she rather continue with Dr. Tonita Cong.   Interventional therapies: Ms. Sulton  was informed that there is no guarantee that she would be a candidate for interventional therapies. The decision will be based on the results of diagnostic studies, as well as Ms. Sarchet risk profile.  Procedures under consideration include: Diagnostic right-sided posterior thoracic sympathetic block under CT guidance.  Possible neurolytic right posterior thoracic sympathectomy under CT guidance.  Diagnostic right biceps tendon block.  Diagnostic supraspinatous block  Diagnostic infraspinatus block  Diagnostic posterior labral block  Diagnostic right subacromial bursa injection  Diagnostic right subdeltoid bursa injection  Diagnostic right-sided cervical epidural steroid injection under fluoroscopic guidance, with or without sedation.    Requested PM Follow-up: Return for Schedule Procedure, (ASAP).  Future Appointments Date Time Provider [place]  09/19/2016 10:30 AM Mosie Lukes, MD [id number] None    Primary Care Physician: Penni Homans, MD Location: [facility] Note by: Kathlen Brunswick. Dossie Arbour, M.D, DABA, DABAPM, DABPM, DABIPP, FIPP  Pain Score Disclaimer: We use the NRS-11 scale. This is a self-reported, subjective measurement of pain severity with only modest accuracy. It is used primarily to identify changes within a particular patient.  It must be understood that outpatient pain scales are significantly less accurate that those used for research, where they can be applied under ideal controlled circumstances with minimal exposure to variables. In reality, the score is likely to be a combination of pain intensity and pain affect, where pain affect describes the degree of emotional arousal or changes in action readiness caused by the sensory experience of pain. Factors such as social and work situation, setting, emotional state, anxiety levels, expectation, and prior pain experience may influence pain perception and show large inter-individual  differences that may also be affected by time variables.  Patient instructions provided during this appointment: There are no Patient Instructions on file for this visit.

## 2016-09-04 NOTE — Assessment & Plan Note (Signed)
Clinical Correlation: C3-4: C4 nerve root exits this level. It innervates the area of the clavicle. C4-5: C5 nerve root exits this level. It innervates the area of the deltoid and lateral upper arm.  C5-6: C6 nerve root exits this level. It innervates the radial (lateral) aspect of the forearm, as well as the thumb.  C6-7: C7 nerve root exits this level. It innervates the area of the index and middle fingers.  C7-T1: C8 nerve root exits this level. It innervates the area of the ring and pinky fingers.  T1-2: Minimal anterior slip T1.  Minimal bulge.

## 2016-09-09 DIAGNOSIS — M79601 Pain in right arm: Secondary | ICD-10-CM | POA: Diagnosis not present

## 2016-09-09 DIAGNOSIS — G90511 Complex regional pain syndrome I of right upper limb: Secondary | ICD-10-CM | POA: Diagnosis not present

## 2016-09-09 DIAGNOSIS — M7989 Other specified soft tissue disorders: Secondary | ICD-10-CM | POA: Diagnosis not present

## 2016-09-11 DIAGNOSIS — M79601 Pain in right arm: Secondary | ICD-10-CM | POA: Diagnosis not present

## 2016-09-11 DIAGNOSIS — M7989 Other specified soft tissue disorders: Secondary | ICD-10-CM | POA: Diagnosis not present

## 2016-09-16 DIAGNOSIS — G90511 Complex regional pain syndrome I of right upper limb: Secondary | ICD-10-CM | POA: Diagnosis not present

## 2016-09-16 DIAGNOSIS — M7989 Other specified soft tissue disorders: Secondary | ICD-10-CM | POA: Diagnosis not present

## 2016-09-16 DIAGNOSIS — M79601 Pain in right arm: Secondary | ICD-10-CM | POA: Diagnosis not present

## 2016-09-17 ENCOUNTER — Telehealth: Payer: Self-pay | Admitting: Pain Medicine

## 2016-09-17 NOTE — Telephone Encounter (Signed)
Holland Falling is denying the CT scan after review of clinical   Peer to Peer needed  Please call [phone]  Option 4 then option 2   Ref [id number]   Please let me know if approved   Thank you!!

## 2016-09-18 DIAGNOSIS — M7989 Other specified soft tissue disorders: Secondary | ICD-10-CM | POA: Diagnosis not present

## 2016-09-18 DIAGNOSIS — M79601 Pain in right arm: Secondary | ICD-10-CM | POA: Diagnosis not present

## 2016-09-19 ENCOUNTER — Ambulatory Visit (INDEPENDENT_AMBULATORY_CARE_PROVIDER_SITE_OTHER): Payer: Medicare HMO | Admitting: Family Medicine

## 2016-09-19 ENCOUNTER — Encounter: Payer: Self-pay | Admitting: Family Medicine

## 2016-09-19 ENCOUNTER — Other Ambulatory Visit (HOSPITAL_COMMUNITY)
Admission: RE | Admit: 2016-09-19 | Discharge: 2016-09-19 | Disposition: A | Discharge status: Home / Self Care | Payer: Medicare HMO | Source: Ambulatory Visit | Attending: Family Medicine | Admitting: Family Medicine

## 2016-09-19 VITALS — BP 108/78 | HR 70 | Temp 98.5°F | Ht 59.5 in | Wt 138.2 lb

## 2016-09-19 DIAGNOSIS — E782 Mixed hyperlipidemia: Secondary | ICD-10-CM

## 2016-09-19 DIAGNOSIS — G90511 Complex regional pain syndrome I of right upper limb: Secondary | ICD-10-CM

## 2016-09-19 DIAGNOSIS — Z124 Encounter for screening for malignant neoplasm of cervix: Secondary | ICD-10-CM

## 2016-09-19 DIAGNOSIS — Z23 Encounter for immunization: Secondary | ICD-10-CM | POA: Diagnosis not present

## 2016-09-19 DIAGNOSIS — Z0000 Encounter for general adult medical examination without abnormal findings: Secondary | ICD-10-CM | POA: Diagnosis not present

## 2016-09-19 DIAGNOSIS — M8580 Other specified disorders of bone density and structure, unspecified site: Secondary | ICD-10-CM

## 2016-09-19 DIAGNOSIS — K219 Gastro-esophageal reflux disease without esophagitis: Secondary | ICD-10-CM

## 2016-09-19 LAB — COMPREHENSIVE METABOLIC PANEL
ALT: 12 U/L (ref 0–35)
AST: 14 U/L (ref 0–37)
Albumin: 4.3 g/dL (ref 3.5–5.2)
Alkaline Phosphatase: 49 U/L (ref 39–117)
BUN: 27 mg/dL — ABNORMAL HIGH (ref 6–23)
CO2: 29 mEq/L (ref 19–32)
Calcium: 9.5 mg/dL (ref 8.4–10.5)
Chloride: 103 mEq/L (ref 96–112)
Creatinine, Ser: 0.85 mg/dL (ref 0.40–1.20)
GFR: 71.08 mL/min (ref >60.00)
Glucose, Bld: 84 mg/dL (ref 70–99)
Potassium: 4.1 mEq/L (ref 3.5–5.1)
Sodium: 139 mEq/L (ref 135–145)
Total Bilirubin: 0.3 mg/dL (ref 0.2–1.2)
Total Protein: 7.1 g/dL (ref 6.0–8.3)

## 2016-09-19 LAB — CBC
HCT: 36 % (ref 36.0–46.0)
HEMOGLOBIN: 11.9 g/dL — AB (ref 12.0–15.0)
MCHC: 33.1 g/dL (ref 30.0–36.0)
MCV: 88.8 fl (ref 78.0–100.0)
Platelets: 205 10*3/uL (ref 150.0–400.0)
RBC: 4.06 Mil/uL (ref 3.87–5.11)
RDW: 12.9 % (ref 11.5–15.5)
WBC: 4.9 10*3/uL (ref 4.0–10.5)

## 2016-09-19 LAB — LIPID PANEL
Cholesterol: 216 mg/dL — ABNORMAL HIGH (ref 0–200)
HDL: 83.6 mg/dL (ref >39.00)
LDL Cholesterol: 104 mg/dL — ABNORMAL HIGH (ref 0–99)
NonHDL: 132
Total CHOL/HDL Ratio: 3
Triglycerides: 140 mg/dL (ref 0.0–149.0)
VLDL: 28 mg/dL (ref 0.0–40.0)

## 2016-09-19 LAB — TSH: TSH: 2.88 u[IU]/mL (ref 0.35–4.50)

## 2016-09-19 NOTE — Patient Instructions (Signed)
Preventive Care for Adults, Female A healthy lifestyle and preventive care can promote health and wellness. Preventive health guidelines for women include the following key practices.  A routine yearly physical is a good way to check with your health care provider about your health and preventive screening. It is a chance to share any concerns and updates on your health and to receive a thorough exam.  Visit your dentist for a routine exam and preventive care every 6 months. Brush your teeth twice a day and floss once a day. Good oral hygiene prevents tooth decay and gum disease.  The frequency of eye exams is based on your age, health, family medical history, use of contact lenses, and other factors. Follow your health care provider's recommendations for frequency of eye exams.  Eat a healthy diet. Foods like vegetables, fruits, whole grains, low-fat dairy products, and lean protein foods contain the nutrients you need without too many calories. Decrease your intake of foods high in solid fats, added sugars, and salt. Eat the right amount of calories for you.Get information about a proper diet from your health care provider, if necessary.  Regular physical exercise is one of the most important things you can do for your health. Most adults should get at least 150 minutes of moderate-intensity exercise (any activity that increases your heart rate and causes you to sweat) each week. In addition, most adults need muscle-strengthening exercises on 2 or more days a week.  Maintain a healthy weight. The body mass index (BMI) is a screening tool to identify possible weight problems. It provides an estimate of body fat based on height and weight. Your health care provider can find your BMI and can help you achieve or maintain a healthy weight.For adults 20 years and older:  A BMI below 18.5 is considered underweight.  A BMI of 18.5 to 24.9 is normal.  A BMI of 25 to 29.9 is considered overweight.  A  BMI of 30 and above is considered obese.  Maintain normal blood lipids and cholesterol levels by exercising and minimizing your intake of saturated fat. Eat a balanced diet with plenty of fruit and vegetables. Blood tests for lipids and cholesterol should begin at age 45 and be repeated every 5 years. If your lipid or cholesterol levels are high, you are over 50, or you are at high risk for heart disease, you may need your cholesterol levels checked more frequently.Ongoing high lipid and cholesterol levels should be treated with medicines if diet and exercise are not working.  If you smoke, find out from your health care provider how to quit. If you do not use tobacco, do not start.  Lung cancer screening is recommended for adults aged 45-80 years who are at high risk for developing lung cancer because of a history of smoking. A yearly low-dose CT scan of the lungs is recommended for people who have at least a 30-pack-year history of smoking and are a current smoker or have quit within the past 15 years. A pack year of smoking is smoking an average of 1 pack of cigarettes a day for 1 year (for example: 1 pack a day for 30 years or 2 packs a day for 15 years). Yearly screening should continue until the smoker has stopped smoking for at least 15 years. Yearly screening should be stopped for people who develop a health problem that would prevent them from having lung cancer treatment.  If you are pregnant, do not drink alcohol. If you are  breastfeeding, be very cautious about drinking alcohol. If you are not pregnant and choose to drink alcohol, do not have more than 1 drink per day. One drink is considered to be 12 ounces (355 mL) of beer, 5 ounces (148 mL) of wine, or 1.5 ounces (44 mL) of liquor.  Avoid use of street drugs. Do not share needles with anyone. Ask for help if you need support or instructions about stopping the use of drugs.  High blood pressure causes heart disease and increases the risk  of stroke. Your blood pressure should be checked at least every 1 to 2 years. Ongoing high blood pressure should be treated with medicines if weight loss and exercise do not work.  If you are 55-79 years old, ask your health care provider if you should take aspirin to prevent strokes.  Diabetes screening is done by taking a blood sample to check your blood glucose level after you have not eaten for a certain period of time (fasting). If you are not overweight and you do not have risk factors for diabetes, you should be screened once every 3 years starting at age 45. If you are overweight or obese and you are 40-70 years of age, you should be screened for diabetes every year as part of your cardiovascular risk assessment.  Breast cancer screening is essential preventive care for women. You should practice "breast self-awareness." This means understanding the normal appearance and feel of your breasts and may include breast self-examination. Any changes detected, no matter how small, should be reported to a health care provider. Women in their 20s and 30s should have a clinical breast exam (CBE) by a health care provider as part of a regular health exam every 1 to 3 years. After age 40, women should have a CBE every year. Starting at age 40, women should consider having a mammogram (breast X-ray test) every year. Women who have a family history of breast cancer should talk to their health care provider about genetic screening. Women at a high risk of breast cancer should talk to their health care providers about having an MRI and a mammogram every year.  Breast cancer gene (BRCA)-related cancer risk assessment is recommended for women who have family members with BRCA-related cancers. BRCA-related cancers include breast, ovarian, tubal, and peritoneal cancers. Having family members with these cancers may be associated with an increased risk for harmful changes (mutations) in the breast cancer genes BRCA1 and  BRCA2. Results of the assessment will determine the need for genetic counseling and BRCA1 and BRCA2 testing.  Your health care provider may recommend that you be screened regularly for cancer of the pelvic organs (ovaries, uterus, and vagina). This screening involves a pelvic examination, including checking for microscopic changes to the surface of your cervix (Pap test). You may be encouraged to have this screening done every 3 years, beginning at age 21.  For women ages 30-65, health care providers may recommend pelvic exams and Pap testing every 3 years, or they may recommend the Pap and pelvic exam, combined with testing for human papilloma virus (HPV), every 5 years. Some types of HPV increase your risk of cervical cancer. Testing for HPV may also be done on women of any age with unclear Pap test results.  Other health care providers may not recommend any screening for nonpregnant women who are considered low risk for pelvic cancer and who do not have symptoms. Ask your health care provider if a screening pelvic exam is right for   you.  If you have had past treatment for cervical cancer or a condition that could lead to cancer, you need Pap tests and screening for cancer for at least 20 years after your treatment. If Pap tests have been discontinued, your risk factors (such as having a new sexual partner) need to be reassessed to determine if screening should resume. Some women have medical problems that increase the chance of getting cervical cancer. In these cases, your health care provider may recommend more frequent screening and Pap tests.  Colorectal cancer can be detected and often prevented. Most routine colorectal cancer screening begins at the age of 50 years and continues through age 75 years. However, your health care provider may recommend screening at an earlier age if you have risk factors for colon cancer. On a yearly basis, your health care provider may provide home test kits to check  for hidden blood in the stool. Use of a small camera at the end of a tube, to directly examine the colon (sigmoidoscopy or colonoscopy), can detect the earliest forms of colorectal cancer. Talk to your health care provider about this at age 50, when routine screening begins. Direct exam of the colon should be repeated every 5-10 years through age 75 years, unless early forms of precancerous polyps or small growths are found.  People who are at an increased risk for hepatitis B should be screened for this virus. You are considered at high risk for hepatitis B if:  You were born in a country where hepatitis B occurs often. Talk with your health care provider about which countries are considered high risk.  Your parents were born in a high-risk country and you have not received a shot to protect against hepatitis B (hepatitis B vaccine).  You have HIV or AIDS.  You use needles to inject street drugs.  You live with, or have sex with, someone who has hepatitis B.  You get hemodialysis treatment.  You take certain medicines for conditions like cancer, organ transplantation, and autoimmune conditions.  Hepatitis C blood testing is recommended for all people born from 1945 through 1965 and any individual with known risks for hepatitis C.  Practice safe sex. Use condoms and avoid high-risk sexual practices to reduce the spread of sexually transmitted infections (STIs). STIs include gonorrhea, chlamydia, syphilis, trichomonas, herpes, HPV, and human immunodeficiency virus (HIV). Herpes, HIV, and HPV are viral illnesses that have no cure. They can result in disability, cancer, and death.  You should be screened for sexually transmitted illnesses (STIs) including gonorrhea and chlamydia if:  You are sexually active and are younger than 24 years.  You are older than 24 years and your health care provider tells you that you are at risk for this type of infection.  Your sexual activity has changed  since you were last screened and you are at an increased risk for chlamydia or gonorrhea. Ask your health care provider if you are at risk.  If you are at risk of being infected with HIV, it is recommended that you take a prescription medicine daily to prevent HIV infection. This is called preexposure prophylaxis (PrEP). You are considered at risk if:  You are sexually active and do not regularly use condoms or know the HIV status of your partner(s).  You take drugs by injection.  You are sexually active with a partner who has HIV.  Talk with your health care provider about whether you are at high risk of being infected with HIV. If   you choose to begin PrEP, you should first be tested for HIV. You should then be tested every 3 months for as long as you are taking PrEP.  Osteoporosis is a disease in which the bones lose minerals and strength with aging. This can result in serious bone fractures or breaks. The risk of osteoporosis can be identified using a bone density scan. Women ages 67 years and over and women at risk for fractures or osteoporosis should discuss screening with their health care providers. Ask your health care provider whether you should take a calcium supplement or vitamin D to reduce the rate of osteoporosis.  Menopause can be associated with physical symptoms and risks. Hormone replacement therapy is available to decrease symptoms and risks. You should talk to your health care provider about whether hormone replacement therapy is right for you.  Use sunscreen. Apply sunscreen liberally and repeatedly throughout the day. You should seek shade when your shadow is shorter than you. Protect yourself by wearing long sleeves, pants, a wide-brimmed hat, and sunglasses year round, whenever you are outdoors.  Once a month, do a whole body skin exam, using a mirror to look at the skin on your back. Tell your health care provider of new moles, moles that have irregular borders, moles that  are larger than a pencil eraser, or moles that have changed in shape or color.  Stay current with required vaccines (immunizations).  Influenza vaccine. All adults should be immunized every year.  Tetanus, diphtheria, and acellular pertussis (Td, Tdap) vaccine. Pregnant women should receive 1 dose of Tdap vaccine during each pregnancy. The dose should be obtained regardless of the length of time since the last dose. Immunization is preferred during the 27th-36th week of gestation. An adult who has not previously received Tdap or who does not know her vaccine status should receive 1 dose of Tdap. This initial dose should be followed by tetanus and diphtheria toxoids (Td) booster doses every 10 years. Adults with an unknown or incomplete history of completing a 3-dose immunization series with Td-containing vaccines should begin or complete a primary immunization series including a Tdap dose. Adults should receive a Td booster every 10 years.  Varicella vaccine. An adult without evidence of immunity to varicella should receive 2 doses or a second dose if she has previously received 1 dose. Pregnant females who do not have evidence of immunity should receive the first dose after pregnancy. This first dose should be obtained before leaving the health care facility. The second dose should be obtained 4-8 weeks after the first dose.  Human papillomavirus (HPV) vaccine. Females aged 13-26 years who have not received the vaccine previously should obtain the 3-dose series. The vaccine is not recommended for use in pregnant females. However, pregnancy testing is not needed before receiving a dose. If a female is found to be pregnant after receiving a dose, no treatment is needed. In that case, the remaining doses should be delayed until after the pregnancy. Immunization is recommended for any person with an immunocompromised condition through the age of 61 years if she did not get any or all doses earlier. During the  3-dose series, the second dose should be obtained 4-8 weeks after the first dose. The third dose should be obtained 24 weeks after the first dose and 16 weeks after the second dose.  Zoster vaccine. One dose is recommended for adults aged 30 years or older unless certain conditions are present.  Measles, mumps, and rubella (MMR) vaccine. Adults born  before 1957 generally are considered immune to measles and mumps. Adults born in 1957 or later should have 1 or more doses of MMR vaccine unless there is a contraindication to the vaccine or there is laboratory evidence of immunity to each of the three diseases. A routine second dose of MMR vaccine should be obtained at least 28 days after the first dose for students attending postsecondary schools, health care workers, or international travelers. People who received inactivated measles vaccine or an unknown type of measles vaccine during 1963-1967 should receive 2 doses of MMR vaccine. People who received inactivated mumps vaccine or an unknown type of mumps vaccine before 1979 and are at high risk for mumps infection should consider immunization with 2 doses of MMR vaccine. For females of childbearing age, rubella immunity should be determined. If there is no evidence of immunity, females who are not pregnant should be vaccinated. If there is no evidence of immunity, females who are pregnant should delay immunization until after pregnancy. Unvaccinated health care workers born before 1957 who lack laboratory evidence of measles, mumps, or rubella immunity or laboratory confirmation of disease should consider measles and mumps immunization with 2 doses of MMR vaccine or rubella immunization with 1 dose of MMR vaccine.  Pneumococcal 13-valent conjugate (PCV13) vaccine. When indicated, a person who is uncertain of his immunization history and has no record of immunization should receive the PCV13 vaccine. All adults 65 years of age and older should receive this  vaccine. An adult aged 19 years or older who has certain medical conditions and has not been previously immunized should receive 1 dose of PCV13 vaccine. This PCV13 should be followed with a dose of pneumococcal polysaccharide (PPSV23) vaccine. Adults who are at high risk for pneumococcal disease should obtain the PPSV23 vaccine at least 8 weeks after the dose of PCV13 vaccine. Adults older than 65 years of age who have normal immune system function should obtain the PPSV23 vaccine dose at least 1 year after the dose of PCV13 vaccine.  Pneumococcal polysaccharide (PPSV23) vaccine. When PCV13 is also indicated, PCV13 should be obtained first. All adults aged 65 years and older should be immunized. An adult younger than age 65 years who has certain medical conditions should be immunized. Any person who resides in a nursing home or long-term care facility should be immunized. An adult smoker should be immunized. People with an immunocompromised condition and certain other conditions should receive both PCV13 and PPSV23 vaccines. People with human immunodeficiency virus (HIV) infection should be immunized as soon as possible after diagnosis. Immunization during chemotherapy or radiation therapy should be avoided. Routine use of PPSV23 vaccine is not recommended for American Indians, [place] Natives, or people younger than 65 years unless there are medical conditions that require PPSV23 vaccine. When indicated, people who have unknown immunization and have no record of immunization should receive PPSV23 vaccine. One-time revaccination 5 years after the first dose of PPSV23 is recommended for people aged 19-64 years who have chronic kidney failure, nephrotic syndrome, asplenia, or immunocompromised conditions. People who received 1-2 doses of PPSV23 before age 65 years should receive another dose of PPSV23 vaccine at age 65 years or later if at least 5 years have passed since the previous dose. Doses of PPSV23 are not  needed for people immunized with PPSV23 at or after age 65 years.  Meningococcal vaccine. Adults with asplenia or persistent complement component deficiencies should receive 2 doses of quadrivalent meningococcal conjugate (MenACWY-D) vaccine. The doses should be obtained   at least 2 months apart. Microbiologists working with certain meningococcal bacteria, [place] recruits, people at risk during an outbreak, and people who travel to or live in countries with a high rate of meningitis should be immunized. A first-year college student up through age 34 years who is living in a residence hall should receive a dose if she did not receive a dose on or after her 16th birthday. Adults who have certain high-risk conditions should receive one or more doses of vaccine.  Hepatitis A vaccine. Adults who wish to be protected from this disease, have certain high-risk conditions, work with hepatitis A-infected animals, work in hepatitis A research labs, or travel to or work in countries with a high rate of hepatitis A should be immunized. Adults who were previously unvaccinated and who anticipate close contact with an international adoptee during the first 60 days after arrival in the [place] from a country with a high rate of hepatitis A should be immunized.  Hepatitis B vaccine. Adults who wish to be protected from this disease, have certain high-risk conditions, may be exposed to blood or other infectious body fluids, are household contacts or sex partners of hepatitis B positive people, are clients or workers in certain care facilities, or travel to or work in countries with a high rate of hepatitis B should be immunized.  Haemophilus influenzae type b (Hib) vaccine. A previously unvaccinated person with asplenia or sickle cell disease or having a scheduled splenectomy should receive 1 dose of Hib vaccine. Regardless of previous immunization, a recipient of a hematopoietic stem cell transplant should receive a  3-dose series 6-12 months after her successful transplant. Hib vaccine is not recommended for adults with HIV infection. Preventive Services / Frequency Ages 35 to 4 years  Blood pressure check.** / Every 3-5 years.  Lipid and cholesterol check.** / Every 5 years beginning at age 60.  Clinical breast exam.** / Every 3 years for women in their 71s and 10s.  BRCA-related cancer risk assessment.** / For women who have family members with a BRCA-related cancer (breast, ovarian, tubal, or peritoneal cancers).  Pap test.** / Every 2 years from ages 76 through 26. Every 3 years starting at age 61 through age 76 or 93 with a history of 3 consecutive normal Pap tests.  HPV screening.** / Every 3 years from ages 37 through ages 60 to 51 with a history of 3 consecutive normal Pap tests.  Hepatitis C blood test.** / For any individual with known risks for hepatitis C.  Skin self-exam. / Monthly.  Influenza vaccine. / Every year.  Tetanus, diphtheria, and acellular pertussis (Tdap, Td) vaccine.** / Consult your health care provider. Pregnant women should receive 1 dose of Tdap vaccine during each pregnancy. 1 dose of Td every 10 years.  Varicella vaccine.** / Consult your health care provider. Pregnant females who do not have evidence of immunity should receive the first dose after pregnancy.  HPV vaccine. / 3 doses over 6 months, if 93 and younger. The vaccine is not recommended for use in pregnant females. However, pregnancy testing is not needed before receiving a dose.  Measles, mumps, rubella (MMR) vaccine.** / You need at least 1 dose of MMR if you were born in 1957 or later. You may also need a 2nd dose. For females of childbearing age, rubella immunity should be determined. If there is no evidence of immunity, females who are not pregnant should be vaccinated. If there is no evidence of immunity, females who are  pregnant should delay immunization until after pregnancy.  Pneumococcal  13-valent conjugate (PCV13) vaccine.** / Consult your health care provider.  Pneumococcal polysaccharide (PPSV23) vaccine.** / 1 to 2 doses if you smoke cigarettes or if you have certain conditions.  Meningococcal vaccine.** / 1 dose if you are age 68 to 8 years and a Market researcher living in a residence hall, or have one of several medical conditions, you need to get vaccinated against meningococcal disease. You may also need additional booster doses.  Hepatitis A vaccine.** / Consult your health care provider.  Hepatitis B vaccine.** / Consult your health care provider.  Haemophilus influenzae type b (Hib) vaccine.** / Consult your health care provider. Ages 7 to 53 years  Blood pressure check.** / Every year.  Lipid and cholesterol check.** / Every 5 years beginning at age 25 years.  Lung cancer screening. / Every year if you are aged 11-80 years and have a 30-pack-year history of smoking and currently smoke or have quit within the past 15 years. Yearly screening is stopped once you have quit smoking for at least 15 years or develop a health problem that would prevent you from having lung cancer treatment.  Clinical breast exam.** / Every year after age 48 years.  BRCA-related cancer risk assessment.** / For women who have family members with a BRCA-related cancer (breast, ovarian, tubal, or peritoneal cancers).  Mammogram.** / Every year beginning at age 41 years and continuing for as long as you are in good health. Consult with your health care provider.  Pap test.** / Every 3 years starting at age 65 years through age 37 or 70 years with a history of 3 consecutive normal Pap tests.  HPV screening.** / Every 3 years from ages 72 years through ages 60 to 40 years with a history of 3 consecutive normal Pap tests.  Fecal occult blood test (FOBT) of stool. / Every year beginning at age 21 years and continuing until age 5 years. You may not need to do this test if you get  a colonoscopy every 10 years.  Flexible sigmoidoscopy or colonoscopy.** / Every 5 years for a flexible sigmoidoscopy or every 10 years for a colonoscopy beginning at age 35 years and continuing until age 48 years.  Hepatitis C blood test.** / For all people born from [birth year] through 1965 and any individual with known risks for hepatitis C.  Skin self-exam. / Monthly.  Influenza vaccine. / Every year.  Tetanus, diphtheria, and acellular pertussis (Tdap/Td) vaccine.** / Consult your health care provider. Pregnant women should receive 1 dose of Tdap vaccine during each pregnancy. 1 dose of Td every 10 years.  Varicella vaccine.** / Consult your health care provider. Pregnant females who do not have evidence of immunity should receive the first dose after pregnancy.  Zoster vaccine.** / 1 dose for adults aged 30 years or older.  Measles, mumps, rubella (MMR) vaccine.** / You need at least 1 dose of MMR if you were born in 1957 or later. You may also need a second dose. For females of childbearing age, rubella immunity should be determined. If there is no evidence of immunity, females who are not pregnant should be vaccinated. If there is no evidence of immunity, females who are pregnant should delay immunization until after pregnancy.  Pneumococcal 13-valent conjugate (PCV13) vaccine.** / Consult your health care provider.  Pneumococcal polysaccharide (PPSV23) vaccine.** / 1 to 2 doses if you smoke cigarettes or if you have certain conditions.  Meningococcal vaccine.** /  Consult your health care provider.  Hepatitis A vaccine.** / Consult your health care provider.  Hepatitis B vaccine.** / Consult your health care provider.  Haemophilus influenzae type b (Hib) vaccine.** / Consult your health care provider. Ages 64 years and over  Blood pressure check.** / Every year.  Lipid and cholesterol check.** / Every 5 years beginning at age 23 years.  Lung cancer screening. / Every year if you  are aged 16-80 years and have a 30-pack-year history of smoking and currently smoke or have quit within the past 15 years. Yearly screening is stopped once you have quit smoking for at least 15 years or develop a health problem that would prevent you from having lung cancer treatment.  Clinical breast exam.** / Every year after age 74 years.  BRCA-related cancer risk assessment.** / For women who have family members with a BRCA-related cancer (breast, ovarian, tubal, or peritoneal cancers).  Mammogram.** / Every year beginning at age 44 years and continuing for as long as you are in good health. Consult with your health care provider.  Pap test.** / Every 3 years starting at age 58 years through age 22 or 39 years with 3 consecutive normal Pap tests. Testing can be stopped between 65 and 70 years with 3 consecutive normal Pap tests and no abnormal Pap or HPV tests in the past 10 years.  HPV screening.** / Every 3 years from ages 64 years through ages 70 or 61 years with a history of 3 consecutive normal Pap tests. Testing can be stopped between 65 and 70 years with 3 consecutive normal Pap tests and no abnormal Pap or HPV tests in the past 10 years.  Fecal occult blood test (FOBT) of stool. / Every year beginning at age 40 years and continuing until age 27 years. You may not need to do this test if you get a colonoscopy every 10 years.  Flexible sigmoidoscopy or colonoscopy.** / Every 5 years for a flexible sigmoidoscopy or every 10 years for a colonoscopy beginning at age 7 years and continuing until age 32 years.  Hepatitis C blood test.** / For all people born from [birth year] through 1965 and any individual with known risks for hepatitis C.  Osteoporosis screening.** / A one-time screening for women ages 30 years and over and women at risk for fractures or osteoporosis.  Skin self-exam. / Monthly.  Influenza vaccine. / Every year.  Tetanus, diphtheria, and acellular pertussis (Tdap/Td)  vaccine.** / 1 dose of Td every 10 years.  Varicella vaccine.** / Consult your health care provider.  Zoster vaccine.** / 1 dose for adults aged 35 years or older.  Pneumococcal 13-valent conjugate (PCV13) vaccine.** / Consult your health care provider.  Pneumococcal polysaccharide (PPSV23) vaccine.** / 1 dose for all adults aged 46 years and older.  Meningococcal vaccine.** / Consult your health care provider.  Hepatitis A vaccine.** / Consult your health care provider.  Hepatitis B vaccine.** / Consult your health care provider.  Haemophilus influenzae type b (Hib) vaccine.** / Consult your health care provider. ** Family history and personal history of risk and conditions may change your health care provider's recommendations.   This information is not intended to replace advice given to you by your health care provider. Make sure you discuss any questions you have with your health care provider.   Document Released: 12/23/2001 Document Revised: 11/17/2014 Document Reviewed: 03/24/2011 Elsevier Interactive Patient Education [employer].

## 2016-09-19 NOTE — Assessment & Plan Note (Signed)
Encouraged heart healthy diet, increase exercise, avoid trans fats, consider a krill oil cap daily 

## 2016-09-19 NOTE — Assessment & Plan Note (Signed)
Avoid offending foods, start probiotics. Do not eat large meals in late evening and consider raising head of bed.  

## 2016-09-19 NOTE — Assessment & Plan Note (Signed)
Encouraged to get adequate exercise, calcium and vitamin d intake 

## 2016-09-19 NOTE — Progress Notes (Signed)
Patient ID: Stephanie Barnes, female   DOB: Jul 03, 1951, 65 y.o.   MRN: [id number]   Subjective:    Patient ID: Stephanie Barnes, female    DOB: Nov 22, 1950, 65 y.o.   MRN: [id number]  Chief Complaint  Patient presents with  . Annual Exam    HPI Patient is in today for annual preventative exam and follow up on ongoing medical concerns. Continues to get weekly nerve blocks with Dr Isaiah Blakes and regular physical therapy and work at home. As a result she is seeing improvement in [place] for the first time in many months. She is encouraged and continues to work hard. Dr Isaiah Blakes is retiring in a few months so they are setting her up with Dr Dossie Arbour who will be performing a new nerve block that will help her for as much as 13 weeks where as the current nerve block only lasts 9 hours which is only long enough to complete one episode of physical therapy. No recent hospitalization or acute concerns. Denies CP/palp/SOB/HA/congestion/fevers/GI or GU c/o. Taking meds as prescribed. Doing with ADLs at home despite her injuries.   Past Medical History:  Diagnosis Date  . Abnormal EKG 06/13/2013  . Anemia 01/13/2016  . Anxiety   . Arthritis   . Complex regional pain syndrome 01/01/2016  . Depression   . 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 Medicare annual wellness visit, subsequent 10/02/2015  . Multiple allergies 03/31/2016  . Rotator cuff tear   . 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 Welcome to Medicare preventive visit 10/02/2015    Past Surgical History:  Procedure Laterality Date  . DENTAL SURGERY     Root cleaning   . REFRACTIVE SURGERY Bilateral 2000  . ROTATOR CUFF REPAIR Right 2017  . TONSILLECTOMY      Family History  Problem Relation Age of Onset  . Heart disease Mother   . Arthritis Mother   . Heart disease Father   . COPD Father   . Mental illness Brother     bipolar  . Hypertension Brother   . Suicidality  Brother   . Alcohol abuse Brother   . Heart disease Maternal Aunt   . Heart disease Maternal Uncle   . Heart disease Paternal Aunt   . COPD Paternal 33   . Heart disease Paternal Uncle   . Heart disease Maternal Grandmother   . Heart disease Maternal Grandfather   . Heart disease Paternal Grandmother   . Heart disease Paternal Grandfather     Social History   Social History  . Marital status: Single    Spouse name: N/A  . Number of children: N/A  . Years of education: N/A   Occupational History  . Not on file.   Social History Main Topics  . Smoking status: Never Smoker  . Smokeless tobacco: Never Used  . Alcohol use 0.6 oz/week    1 Glasses of wine per week     Comment: per month  . Drug use: No  . Sexual activity: No     Comment: lives alone, retired from [employer], no dietary restrictions   Other Topics Concern  . Not on file   Social History Narrative  . No narrative on file    Outpatient Medications Prior to Visit  Medication Sig Dispense Refill  . Acetaminophen (TYLENOL ARTHRITIS PAIN PO) Take 650 mg by mouth at bedtime.    Marland Kitchen acetaminophen (  TYLENOL) 500 MG tablet Take 500 mg by mouth daily.    . Ascorbic Acid (VITAMIN C) 1000 MG tablet Take 1,000 mg by mouth daily.    Marland Kitchen buPROPion (WELLBUTRIN) 75 MG tablet Take 1/2 by mouth daily (Patient taking differently: 75 mg. Take 1/2 by mouth daily) 30 tablet 5  . Calcium Carbonate-Vit D-Min (CALCIUM 1200 PO) Take 1 tablet by mouth 2 (two) times daily.    . cetirizine (ZYRTEC) 10 MG tablet Take 10 mg by mouth daily as needed.     . chlorhexidine (PERIDEX) 0.12 % solution Use as directed 5 mLs in the mouth or throat at bedtime.     . clonazePAM (KLONOPIN) 1 MG tablet Take 1 tablet (1 mg total) by mouth 3 (three) times daily as needed. for anxiety (Patient taking differently: Take 1 mg by mouth at bedtime. for anxiety) 90 tablet 0  . Cyanocobalamin (VITAMIN B 12 PO) Take 1,000 mg by mouth daily.     Marland Kitchen docusate  sodium (COLACE) 100 MG capsule Take 100 mg by mouth as needed.     Marland Kitchen ECHINACEA PO Take 1,200 mg by mouth 2 (two) times daily.    Marland Kitchen gabapentin (NEURONTIN) 300 MG capsule 1 tab po bid and 2 tabs po qhs (Patient taking differently: 1 tab po midday and 2 tabs po qhs) 120 capsule 3  . HYDROmorphone (DILAUDID) 2 MG tablet Take 2 mg by mouth every 6 (six) hours as needed for severe pain.     Marland Kitchen ibuprofen (ADVIL,MOTRIN) 200 MG tablet Take 400 mg by mouth at bedtime.    . methocarbamol (ROBAXIN) 500 MG tablet Take 500 mg by mouth every 6 (six) hours as needed for muscle spasms. Takes one in morning and one at night    . mometasone (ELOCON) 0.1 % ointment Apply topically daily. As needed 15 g 0  . Omega-3 Fatty Acids (SUPER OMEGA-3 PO) Take 1,200 mg by mouth 2 (two) times daily.    . pantoprazole (PROTONIX) 40 MG tablet TAKE ONE TABLET BY MOUTH ONCE DAILY 90 tablet 2  . PENNSAID 2 % SOLN as needed.     . Probiotic Product (PROBIOTIC DAILY PO) Take 1 tablet by mouth daily. NOW 10    . TURMERIC PO Take 894 mg by mouth. GAIA brand-Take 1 in the morning and 1 at night.     No facility-administered medications prior to visit.     Allergies  Allergen Reactions  . Aspirin Anaphylaxis and Swelling    Swelling of lips and fingers   . Celebrex [Celecoxib] Anaphylaxis, Hives and Shortness Of Breath  . Doxycycline Other (See Comments)    Scared throat   . Prednisone Shortness Of Breath and Other (See Comments)    Chest tightness   . Celexa [Citalopram] Other (See Comments)    Shakiness   . Fosamax [Alendronate Sodium] Hives  . Clindamycin/Lincomycin Nausea And Vomiting  . Other Hives and Nausea Only    Arthritec    Review of Systems  Constitutional: Negative for chills, fever and malaise/fatigue.  HENT: Negative for congestion and hearing loss.   Eyes: Negative for discharge.  Respiratory: Negative for cough, sputum production and shortness of breath.   Cardiovascular: Negative for chest pain,  palpitations and leg swelling.  Gastrointestinal: Negative for abdominal pain, blood in stool, constipation, diarrhea, heartburn, nausea and vomiting.  Genitourinary: Negative for dysuria, frequency, hematuria and urgency.  Musculoskeletal: Positive for joint pain, myalgias and neck pain. Negative for back pain and falls.  Skin:  Negative for rash.  Neurological: Negative for dizziness, sensory change, loss of consciousness, weakness and headaches.  Endo/Heme/Allergies: Negative for environmental allergies. Does not bruise/bleed easily.  Psychiatric/Behavioral: Negative for suicidal ideas. The patient is not nervous/anxious and does not have insomnia.        Objective:    Physical Exam  Constitutional: She is oriented to person, place, and time. She appears well-developed and well-nourished. No distress.  HENT:  Head: Normocephalic and atraumatic.  Eyes: Conjunctivae are normal.  Neck: Neck supple. No thyromegaly present.  Cardiovascular: Normal rate, regular rhythm and normal heart sounds.   No murmur heard. Pulmonary/Chest: Effort normal and breath sounds normal. No respiratory distress.  Abdominal: Soft. Bowel sounds are normal. She exhibits no distension and no mass. There is no tenderness.  Musculoskeletal: She exhibits no edema.  Lymphadenopathy:    She has no cervical adenopathy.  Neurological: She is alert and oriented to person, place, and time.  Skin: Skin is warm and dry.  Psychiatric: She has a normal mood and affect. Her behavior is normal.    BP 108/78 (BP Location: Left Arm, Patient Position: Sitting, Cuff Size: Normal)   Pulse 70   Temp 98.5 F (36.9 C) (Oral)   Ht 4' 11.5" (1.511 m)   Wt 138 lb 4 oz (62.7 kg)   BMI 27.46 kg/m  Wt Readings from Last 3 Encounters:  09/19/16 138 lb 4 oz (62.7 kg)  09/04/16 130 lb (59 kg)  04/29/16 129 lb 2 oz (58.6 kg)     Lab Results  Component Value Date   WBC 4.9 09/19/2016   HGB 11.9 (L) 09/19/2016   HCT 36.0  09/19/2016   PLT 205.0 09/19/2016   GLUCOSE 84 09/19/2016   CHOL 216 (H) 09/19/2016   TRIG 140.0 09/19/2016   HDL 83.60 09/19/2016   LDLCALC 104 (H) 09/19/2016   ALT 12 09/19/2016   AST 14 09/19/2016   NA 139 09/19/2016   K 4.1 09/19/2016   CL 103 09/19/2016   CREATININE 0.85 09/19/2016   BUN 27 (H) 09/19/2016   CO2 29 09/19/2016   TSH 2.88 09/19/2016    Lab Results  Component Value Date   TSH 2.88 09/19/2016   Lab Results  Component Value Date   WBC 4.9 09/19/2016   HGB 11.9 (L) 09/19/2016   HCT 36.0 09/19/2016   MCV 88.8 09/19/2016   PLT 205.0 09/19/2016   Lab Results  Component Value Date   NA 139 09/19/2016   K 4.1 09/19/2016   CO2 29 09/19/2016   GLUCOSE 84 09/19/2016   BUN 27 (H) 09/19/2016   CREATININE 0.85 09/19/2016   BILITOT 0.3 09/19/2016   ALKPHOS 49 09/19/2016   AST 14 09/19/2016   ALT 12 09/19/2016   PROT 7.1 09/19/2016   ALBUMIN 4.3 09/19/2016   CALCIUM 9.5 09/19/2016   ANIONGAP 8 12/26/2015   GFR 71.08 09/19/2016   Lab Results  Component Value Date   CHOL 216 (H) 09/19/2016   Lab Results  Component Value Date   HDL 83.60 09/19/2016   Lab Results  Component Value Date   LDLCALC 104 (H) 09/19/2016   Lab Results  Component Value Date   TRIG 140.0 09/19/2016   Lab Results  Component Value Date   CHOLHDL 3 09/19/2016   No results found for: HGBA1C     Assessment & Plan:   Problem List Items Addressed This Visit    Complex regional pain syndrome type I (Location of Primary Source of Pain) (  upper extremity) (Right) (Chronic)    Continues to get weekly nerve blocks with Dr Isaiah Blakes and regular physical therapy and work at home. As a result she is seeing improvement in [place] for the first time in many months. She is encouraged and continues to work hard. Dr Isaiah Blakes is retiring in a few months so they are setting her up with Dr Dossie Arbour who will be performing a new nerve block that will help her for as much as 13 weeks where as the  current nerve block only lasts 9 hours which is only long enough to complete one episode of physical therapy.       GERD (gastroesophageal reflux disease)    Avoid offending foods, start probiotics. Do not eat large meals in late evening and consider raising head of bed.       Relevant Orders   CBC (Completed)   Comprehensive metabolic panel (Completed)   TSH (Completed)   Hyperlipidemia    Encouraged heart healthy diet, increase exercise, avoid trans fats, consider a krill oil cap daily      Relevant Orders   Lipid panel (Completed)   Osteopenia    Encouraged to get adequate exercise, calcium and vitamin d intake      Preventative health care    Patient encouraged to maintain heart healthy diet, regular exercise, adequate sleep. Consider daily probiotics. Take medications as prescribed      Relevant Orders   CBC (Completed)   Comprehensive metabolic panel (Completed)   Hepatitis C Antibody (Completed)    Other Visit Diagnoses    Cervical cancer screening    -  Primary   Relevant Orders   Cytology - PAP (Completed)   Encounter for immunization       Relevant Orders   Flu Vaccine QUAD 36+ mos IM (Completed)      I am having Ms. Arbutus Ped maintain her Probiotic Product (PROBIOTIC DAILY PO), Calcium Carbonate-Vit D-Min (CALCIUM 1200 PO), cetirizine, mometasone, HYDROmorphone, methocarbamol, docusate sodium, chlorhexidine, gabapentin, Cyanocobalamin (VITAMIN B 12 PO), vitamin C, TURMERIC PO, buPROPion, pantoprazole, clonazePAM, PENNSAID, ibuprofen, Omega-3 Fatty Acids (SUPER OMEGA-3 PO), acetaminophen, Acetaminophen (TYLENOL ARTHRITIS PAIN PO), and ECHINACEA PO.  No orders of the defined types were placed in this encounter.    Penni Homans, MD

## 2016-09-19 NOTE — Assessment & Plan Note (Addendum)
Continues to get weekly nerve blocks with Dr Isaiah Blakes and regular physical therapy and work at home. As a result she is seeing improvement in [place] for the first time in many months. She is encouraged and continues to work hard. Dr Isaiah Blakes is retiring in a few months so they are setting her up with Dr Dossie Arbour who will be performing a new nerve block that will help her for as much as 13 weeks where as the current nerve block only lasts 9 hours which is only long enough to complete one episode of physical therapy.

## 2016-09-19 NOTE — Progress Notes (Signed)
Pre visit review using our clinic review tool, if applicable. No additional management support is needed unless otherwise documented below in the visit note. 

## 2016-09-19 NOTE — Assessment & Plan Note (Signed)
Patient encouraged to maintain heart healthy diet, regular exercise, adequate sleep. Consider daily probiotics. Take medications as prescribed 

## 2016-09-20 LAB — HEPATITIS C ANTIBODY: HCV AB: NEGATIVE

## 2016-09-21 ENCOUNTER — Encounter: Payer: Self-pay | Admitting: Family Medicine

## 2016-09-22 ENCOUNTER — Other Ambulatory Visit: Payer: Self-pay | Admitting: Family Medicine

## 2016-09-22 ENCOUNTER — Telehealth: Payer: Self-pay | Admitting: Family Medicine

## 2016-09-22 DIAGNOSIS — Z1239 Encounter for other screening for malignant neoplasm of breast: Secondary | ICD-10-CM

## 2016-09-22 LAB — CYTOLOGY - PAP: Diagnosis: NEGATIVE

## 2016-09-22 NOTE — Telephone Encounter (Signed)
-----   Message from Quintin Alto sent at 09/22/2016 11:54 AM EST ----- Regarding: [place] Contact: [place] @ [facility] needs a call back about this patient

## 2016-09-22 NOTE — Telephone Encounter (Signed)
The Breast center called back to inform they can work with the patient and are able to do a routine mammogram.  I put the order in and they will call the patient to schedule it.

## 2016-09-23 DIAGNOSIS — M79601 Pain in right arm: Secondary | ICD-10-CM | POA: Diagnosis not present

## 2016-09-23 DIAGNOSIS — G90511 Complex regional pain syndrome I of right upper limb: Secondary | ICD-10-CM | POA: Diagnosis not present

## 2016-09-23 DIAGNOSIS — M7989 Other specified soft tissue disorders: Secondary | ICD-10-CM | POA: Diagnosis not present

## 2016-09-25 DIAGNOSIS — M79601 Pain in right arm: Secondary | ICD-10-CM | POA: Diagnosis not present

## 2016-09-25 DIAGNOSIS — M7989 Other specified soft tissue disorders: Secondary | ICD-10-CM | POA: Diagnosis not present

## 2016-09-30 DIAGNOSIS — G90511 Complex regional pain syndrome I of right upper limb: Secondary | ICD-10-CM | POA: Diagnosis not present

## 2016-09-30 DIAGNOSIS — M7989 Other specified soft tissue disorders: Secondary | ICD-10-CM | POA: Diagnosis not present

## 2016-09-30 DIAGNOSIS — M79601 Pain in right arm: Secondary | ICD-10-CM | POA: Diagnosis not present

## 2016-10-05 NOTE — Telephone Encounter (Signed)
Inform patient

## 2016-10-06 NOTE — Telephone Encounter (Signed)
Mrs. Stephanie Barnes called her insurance company and called me back - she needs either the nurse or doctor to call the peer to peer number and give more information as to how the CT scan is needed for the procedure. The procedure CPT [phone] is approved but the CT thoracic CPT 72129 is not   Please call [phone] option 6  Ref # [id number]  Please let me know if you need any further information.

## 2016-10-06 NOTE — Telephone Encounter (Signed)
Call to patient with information that CT scan has been Denied - she will call the insurance company and call me back if she gets further information.

## 2016-10-06 NOTE — Telephone Encounter (Signed)
Dr. Dossie Arbour                    THIS CT WILL NEED PEER TO PEER FOR APPROVAL. Please call the number listed. Thank you.

## 2016-10-08 DIAGNOSIS — M79601 Pain in right arm: Secondary | ICD-10-CM | POA: Diagnosis not present

## 2016-10-08 DIAGNOSIS — G90511 Complex regional pain syndrome I of right upper limb: Secondary | ICD-10-CM | POA: Diagnosis not present

## 2016-10-08 DIAGNOSIS — M7989 Other specified soft tissue disorders: Secondary | ICD-10-CM | POA: Diagnosis not present

## 2016-10-09 ENCOUNTER — Encounter: Payer: Self-pay | Admitting: Pain Medicine

## 2016-10-10 DIAGNOSIS — M7989 Other specified soft tissue disorders: Secondary | ICD-10-CM | POA: Diagnosis not present

## 2016-10-10 DIAGNOSIS — M79601 Pain in right arm: Secondary | ICD-10-CM | POA: Diagnosis not present

## 2016-10-14 DIAGNOSIS — M79601 Pain in right arm: Secondary | ICD-10-CM | POA: Diagnosis not present

## 2016-10-14 DIAGNOSIS — M7989 Other specified soft tissue disorders: Secondary | ICD-10-CM | POA: Diagnosis not present

## 2016-10-16 ENCOUNTER — Other Ambulatory Visit: Payer: Self-pay | Admitting: Family Medicine

## 2016-10-16 DIAGNOSIS — G90511 Complex regional pain syndrome I of right upper limb: Secondary | ICD-10-CM | POA: Diagnosis not present

## 2016-10-16 DIAGNOSIS — M79601 Pain in right arm: Secondary | ICD-10-CM | POA: Diagnosis not present

## 2016-10-16 DIAGNOSIS — M7989 Other specified soft tissue disorders: Secondary | ICD-10-CM | POA: Diagnosis not present

## 2016-10-17 MED ORDER — CLONAZEPAM 1 MG PO TABS: 1.0000 mg | ORAL_TABLET | Freq: Three times a day (TID) | ORAL | 0 refills | Status: DC | PRN

## 2016-10-17 NOTE — Telephone Encounter (Signed)
Last refill 07/15/16  #90 No refills Last office visit 09/19/16

## 2016-10-17 NOTE — Telephone Encounter (Signed)
Faxed hardcopy for clonazepam to [facility] [place]

## 2016-10-21 DIAGNOSIS — M7989 Other specified soft tissue disorders: Secondary | ICD-10-CM | POA: Diagnosis not present

## 2016-10-21 DIAGNOSIS — M79601 Pain in right arm: Secondary | ICD-10-CM | POA: Diagnosis not present

## 2016-10-22 DIAGNOSIS — R200 Anesthesia of skin: Secondary | ICD-10-CM | POA: Diagnosis not present

## 2016-10-23 ENCOUNTER — Encounter: Payer: Self-pay | Admitting: Pain Medicine

## 2016-10-23 DIAGNOSIS — M79601 Pain in right arm: Secondary | ICD-10-CM | POA: Diagnosis not present

## 2016-10-23 DIAGNOSIS — M7989 Other specified soft tissue disorders: Secondary | ICD-10-CM | POA: Diagnosis not present

## 2016-10-23 DIAGNOSIS — G90511 Complex regional pain syndrome I of right upper limb: Secondary | ICD-10-CM | POA: Diagnosis not present

## 2016-10-28 DIAGNOSIS — M7989 Other specified soft tissue disorders: Secondary | ICD-10-CM | POA: Diagnosis not present

## 2016-10-28 DIAGNOSIS — M79601 Pain in right arm: Secondary | ICD-10-CM | POA: Diagnosis not present

## 2016-10-31 ENCOUNTER — Ambulatory Visit
Admission: RE | Admit: 2016-10-31 | Discharge: 2016-10-31 | Disposition: A | Discharge status: Home / Self Care | Payer: Medicare HMO | Source: Ambulatory Visit | Attending: Family Medicine | Admitting: Family Medicine

## 2016-10-31 DIAGNOSIS — Z1239 Encounter for other screening for malignant neoplasm of breast: Secondary | ICD-10-CM

## 2016-10-31 DIAGNOSIS — Z1231 Encounter for screening mammogram for malignant neoplasm of breast: Secondary | ICD-10-CM | POA: Diagnosis not present

## 2016-11-04 DIAGNOSIS — M7989 Other specified soft tissue disorders: Secondary | ICD-10-CM | POA: Diagnosis not present

## 2016-11-04 DIAGNOSIS — M79601 Pain in right arm: Secondary | ICD-10-CM | POA: Diagnosis not present

## 2016-11-06 DIAGNOSIS — M79601 Pain in right arm: Secondary | ICD-10-CM | POA: Diagnosis not present

## 2016-11-06 DIAGNOSIS — M7989 Other specified soft tissue disorders: Secondary | ICD-10-CM | POA: Diagnosis not present

## 2016-11-06 DIAGNOSIS — G90511 Complex regional pain syndrome I of right upper limb: Secondary | ICD-10-CM | POA: Diagnosis not present

## 2016-11-11 DIAGNOSIS — M79601 Pain in right arm: Secondary | ICD-10-CM | POA: Diagnosis not present

## 2016-11-11 DIAGNOSIS — M7989 Other specified soft tissue disorders: Secondary | ICD-10-CM | POA: Diagnosis not present

## 2016-11-13 DIAGNOSIS — G90511 Complex regional pain syndrome I of right upper limb: Secondary | ICD-10-CM | POA: Diagnosis not present

## 2016-11-13 DIAGNOSIS — M79601 Pain in right arm: Secondary | ICD-10-CM | POA: Diagnosis not present

## 2016-11-13 DIAGNOSIS — M7989 Other specified soft tissue disorders: Secondary | ICD-10-CM | POA: Diagnosis not present

## 2016-11-18 DIAGNOSIS — M7989 Other specified soft tissue disorders: Secondary | ICD-10-CM | POA: Diagnosis not present

## 2016-11-18 DIAGNOSIS — M79601 Pain in right arm: Secondary | ICD-10-CM | POA: Diagnosis not present

## 2016-11-20 DIAGNOSIS — M79601 Pain in right arm: Secondary | ICD-10-CM | POA: Diagnosis not present

## 2016-11-20 DIAGNOSIS — G90511 Complex regional pain syndrome I of right upper limb: Secondary | ICD-10-CM | POA: Diagnosis not present

## 2016-11-20 DIAGNOSIS — M7989 Other specified soft tissue disorders: Secondary | ICD-10-CM | POA: Diagnosis not present

## 2016-11-25 DIAGNOSIS — M79601 Pain in right arm: Secondary | ICD-10-CM | POA: Diagnosis not present

## 2016-11-25 DIAGNOSIS — M7989 Other specified soft tissue disorders: Secondary | ICD-10-CM | POA: Diagnosis not present

## 2016-12-02 DIAGNOSIS — M7989 Other specified soft tissue disorders: Secondary | ICD-10-CM | POA: Diagnosis not present

## 2016-12-02 DIAGNOSIS — M79601 Pain in right arm: Secondary | ICD-10-CM | POA: Diagnosis not present

## 2016-12-04 DIAGNOSIS — G90511 Complex regional pain syndrome I of right upper limb: Secondary | ICD-10-CM | POA: Diagnosis not present

## 2016-12-04 DIAGNOSIS — M79601 Pain in right arm: Secondary | ICD-10-CM | POA: Diagnosis not present

## 2016-12-04 DIAGNOSIS — M7989 Other specified soft tissue disorders: Secondary | ICD-10-CM | POA: Diagnosis not present

## 2016-12-09 DIAGNOSIS — M79601 Pain in right arm: Secondary | ICD-10-CM | POA: Diagnosis not present

## 2016-12-09 DIAGNOSIS — M7989 Other specified soft tissue disorders: Secondary | ICD-10-CM | POA: Diagnosis not present

## 2016-12-11 DIAGNOSIS — M7989 Other specified soft tissue disorders: Secondary | ICD-10-CM | POA: Diagnosis not present

## 2016-12-11 DIAGNOSIS — M79601 Pain in right arm: Secondary | ICD-10-CM | POA: Diagnosis not present

## 2016-12-11 DIAGNOSIS — G90511 Complex regional pain syndrome I of right upper limb: Secondary | ICD-10-CM | POA: Diagnosis not present

## 2016-12-16 DIAGNOSIS — M79601 Pain in right arm: Secondary | ICD-10-CM | POA: Diagnosis not present

## 2016-12-16 DIAGNOSIS — M7989 Other specified soft tissue disorders: Secondary | ICD-10-CM | POA: Diagnosis not present

## 2016-12-18 DIAGNOSIS — M7989 Other specified soft tissue disorders: Secondary | ICD-10-CM | POA: Diagnosis not present

## 2016-12-18 DIAGNOSIS — M79601 Pain in right arm: Secondary | ICD-10-CM | POA: Diagnosis not present

## 2016-12-18 DIAGNOSIS — G90511 Complex regional pain syndrome I of right upper limb: Secondary | ICD-10-CM | POA: Diagnosis not present

## 2016-12-23 DIAGNOSIS — M79601 Pain in right arm: Secondary | ICD-10-CM | POA: Diagnosis not present

## 2016-12-23 DIAGNOSIS — M7989 Other specified soft tissue disorders: Secondary | ICD-10-CM | POA: Diagnosis not present

## 2016-12-25 DIAGNOSIS — M7989 Other specified soft tissue disorders: Secondary | ICD-10-CM | POA: Diagnosis not present

## 2016-12-25 DIAGNOSIS — M79601 Pain in right arm: Secondary | ICD-10-CM | POA: Diagnosis not present

## 2016-12-25 DIAGNOSIS — G90511 Complex regional pain syndrome I of right upper limb: Secondary | ICD-10-CM | POA: Diagnosis not present

## 2016-12-30 DIAGNOSIS — M7989 Other specified soft tissue disorders: Secondary | ICD-10-CM | POA: Diagnosis not present

## 2016-12-30 DIAGNOSIS — M79601 Pain in right arm: Secondary | ICD-10-CM | POA: Diagnosis not present

## 2016-12-31 DIAGNOSIS — G5641 Causalgia of right upper limb: Secondary | ICD-10-CM | POA: Diagnosis not present

## 2017-01-01 DIAGNOSIS — M7989 Other specified soft tissue disorders: Secondary | ICD-10-CM | POA: Diagnosis not present

## 2017-01-01 DIAGNOSIS — M79601 Pain in right arm: Secondary | ICD-10-CM | POA: Diagnosis not present

## 2017-01-05 DIAGNOSIS — M79601 Pain in right arm: Secondary | ICD-10-CM | POA: Diagnosis not present

## 2017-01-05 DIAGNOSIS — G90511 Complex regional pain syndrome I of right upper limb: Secondary | ICD-10-CM | POA: Diagnosis not present

## 2017-01-05 DIAGNOSIS — M7989 Other specified soft tissue disorders: Secondary | ICD-10-CM | POA: Diagnosis not present

## 2017-01-08 DIAGNOSIS — M79601 Pain in right arm: Secondary | ICD-10-CM | POA: Diagnosis not present

## 2017-01-08 DIAGNOSIS — M7989 Other specified soft tissue disorders: Secondary | ICD-10-CM | POA: Diagnosis not present

## 2017-01-11 ENCOUNTER — Telehealth: Payer: Self-pay | Admitting: Family Medicine

## 2017-01-11 ENCOUNTER — Encounter: Payer: Self-pay | Admitting: Family Medicine

## 2017-01-12 MED ORDER — PANTOPRAZOLE SODIUM 40 MG PO TBEC: 40.0000 mg | DELAYED_RELEASE_TABLET | Freq: Every day | ORAL | 1 refills | Status: DC

## 2017-01-12 MED ORDER — CLONAZEPAM 1 MG PO TABS: 1.0000 mg | ORAL_TABLET | Freq: Three times a day (TID) | ORAL | 0 refills | Status: DC | PRN

## 2017-01-12 NOTE — Telephone Encounter (Signed)
Patient notified of rx waiting in the front ofc. Patient made aware of UDS and contract.  PC

## 2017-01-12 NOTE — Telephone Encounter (Signed)
Pt is requesting refill on Clonazepam.  Last OV: 09/19/2016  Last Fill: 10/17/2016 #90 and 0RF UDS: None seen

## 2017-01-12 NOTE — Telephone Encounter (Signed)
OK to refill but needs UDS and contract 

## 2017-01-13 ENCOUNTER — Encounter: Payer: Self-pay | Admitting: Emergency Medicine

## 2017-01-13 DIAGNOSIS — M7989 Other specified soft tissue disorders: Secondary | ICD-10-CM | POA: Diagnosis not present

## 2017-01-13 DIAGNOSIS — M79601 Pain in right arm: Secondary | ICD-10-CM | POA: Diagnosis not present

## 2017-01-15 ENCOUNTER — Encounter: Payer: Self-pay | Admitting: Family Medicine

## 2017-01-15 DIAGNOSIS — Z79899 Other long term (current) drug therapy: Secondary | ICD-10-CM | POA: Diagnosis not present

## 2017-01-16 DIAGNOSIS — G90511 Complex regional pain syndrome I of right upper limb: Secondary | ICD-10-CM | POA: Diagnosis not present

## 2017-01-16 DIAGNOSIS — M7989 Other specified soft tissue disorders: Secondary | ICD-10-CM | POA: Diagnosis not present

## 2017-01-16 DIAGNOSIS — M79601 Pain in right arm: Secondary | ICD-10-CM | POA: Diagnosis not present

## 2017-01-20 DIAGNOSIS — M7989 Other specified soft tissue disorders: Secondary | ICD-10-CM | POA: Diagnosis not present

## 2017-01-20 DIAGNOSIS — M79601 Pain in right arm: Secondary | ICD-10-CM | POA: Diagnosis not present

## 2017-01-23 DIAGNOSIS — M79601 Pain in right arm: Secondary | ICD-10-CM | POA: Diagnosis not present

## 2017-01-23 DIAGNOSIS — G90511 Complex regional pain syndrome I of right upper limb: Secondary | ICD-10-CM | POA: Diagnosis not present

## 2017-01-23 DIAGNOSIS — G9050 Complex regional pain syndrome I, unspecified: Secondary | ICD-10-CM | POA: Diagnosis not present

## 2017-01-23 DIAGNOSIS — M7989 Other specified soft tissue disorders: Secondary | ICD-10-CM | POA: Diagnosis not present

## 2017-01-27 DIAGNOSIS — M79601 Pain in right arm: Secondary | ICD-10-CM | POA: Diagnosis not present

## 2017-01-27 DIAGNOSIS — M7989 Other specified soft tissue disorders: Secondary | ICD-10-CM | POA: Diagnosis not present

## 2017-01-28 IMAGING — MR MR CERVICAL SPINE W/O CM
5 series · 33 of 48 positions shown · non-contrast
Comparison: New

CLINICAL DATA: 64-year-old female with severe neck pain extending
to right shoulder and arm for the past 2 months. Weakness right arm.
No numbness. Limited range of motion. No known injury. Initial
encounter.

EXAM:
MRI CERVICAL SPINE WITHOUT CONTRAST
TECHNIQUE: Multiplanar, multisequence MR imaging of the cervical spine was
performed. No intravenous contrast was administered.

[Series 2: T2 · sagittal · 3.0mm · 0.56mm/px · 8 of 13 slices shown (1 of 2)]
[im 1/13]
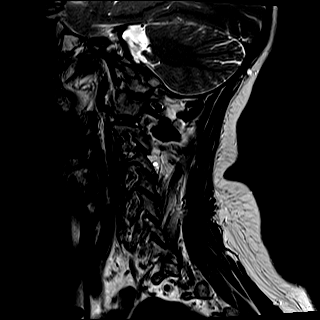
[im 2/13]
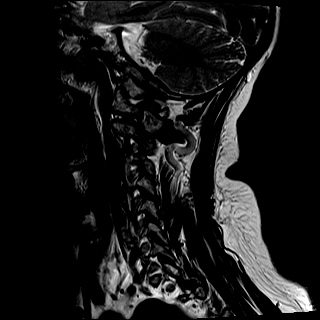
[im 4/13]
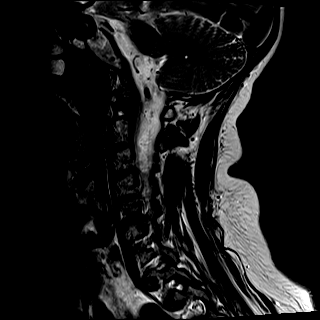
[im 6/13]
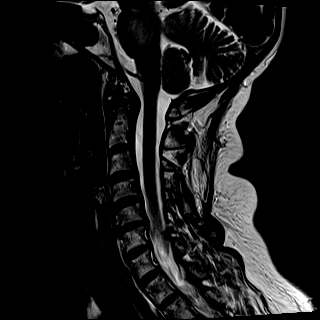
[im 7/13]
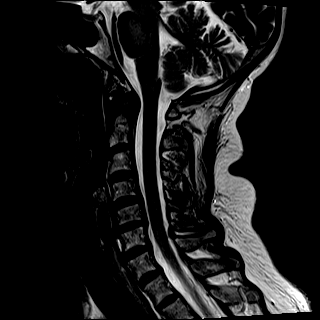
[im 9/13]
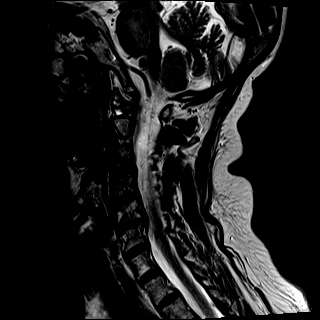
[im 11/13]
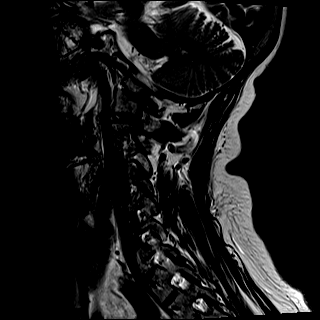
[im 13/13]
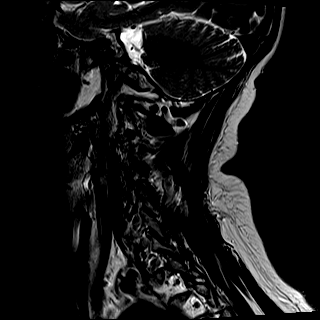

[Series 3: T1 · sagittal · 3.0mm · 0.56mm/px · 7 of 13 slices shown]
[im 1/13]
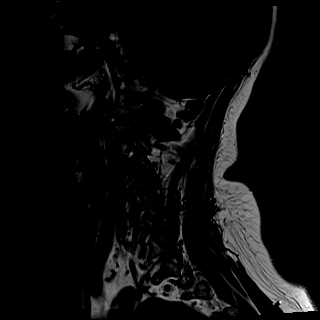
[im 3/13]
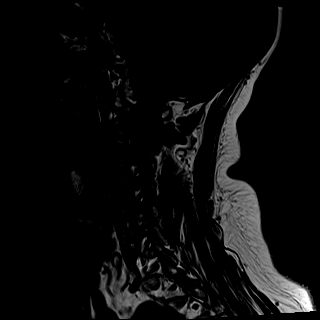
[im 5/13]
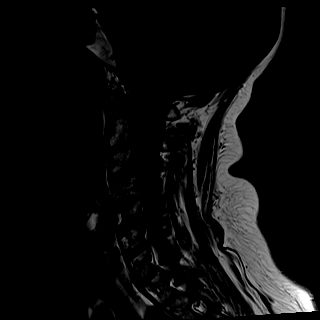
[im 7/13]
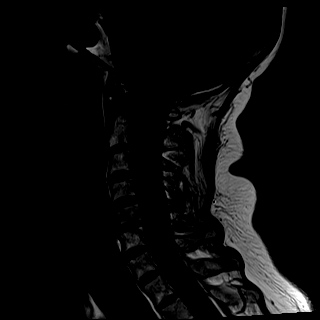
[im 9/13]
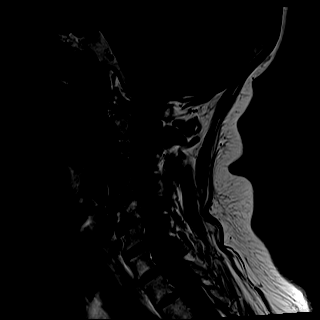
[im 11/13]
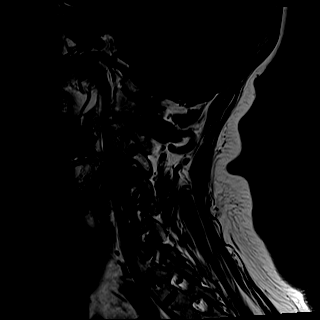
[im 13/13]
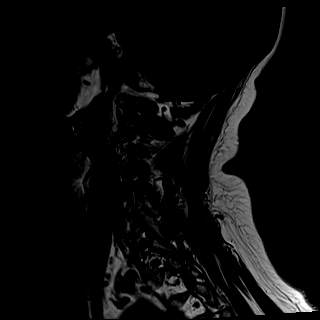

[Series 4: STIR · sagittal · 3.0mm · 0.35mm/px · 7 of 13 slices shown]
[im 1/13]
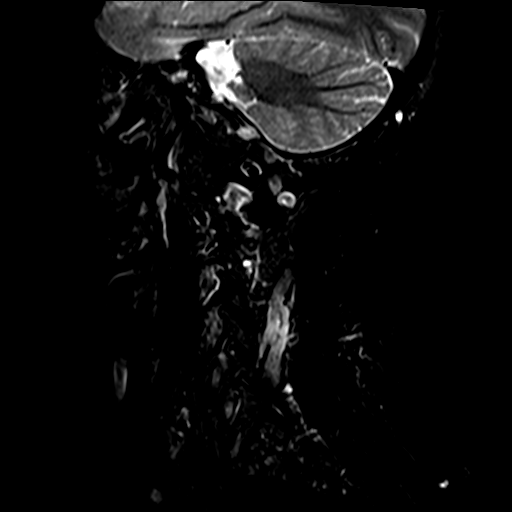
[im 3/13]
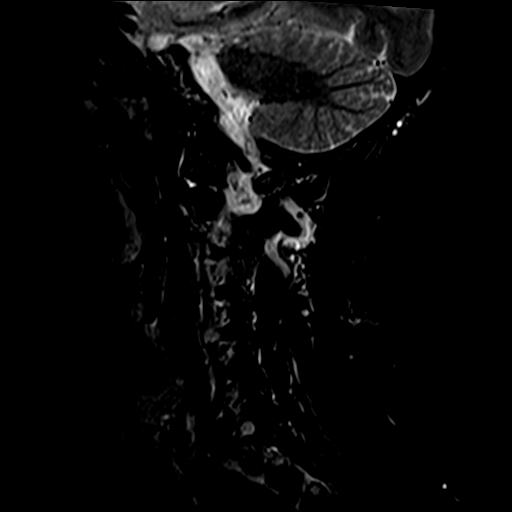
[im 5/13]
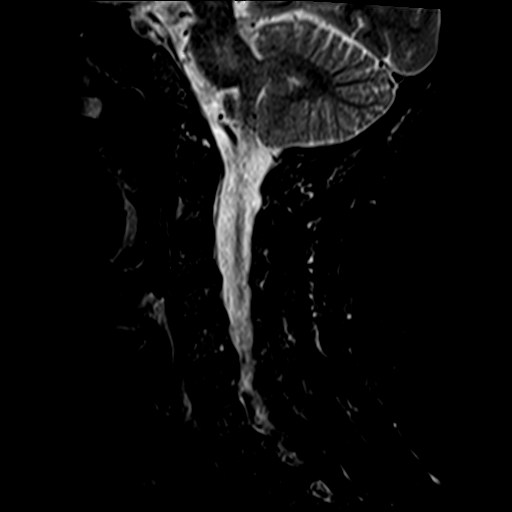
[im 7/13]
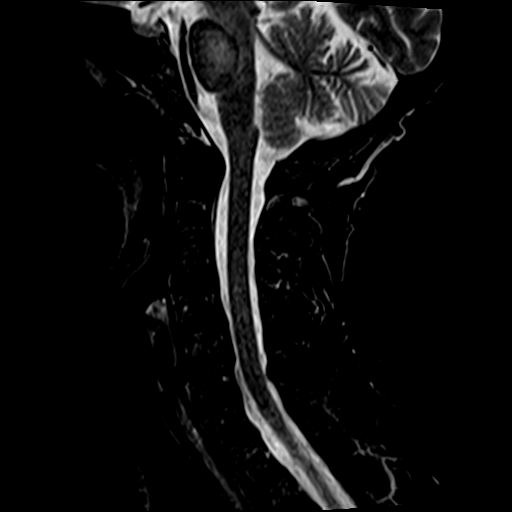
[im 9/13]
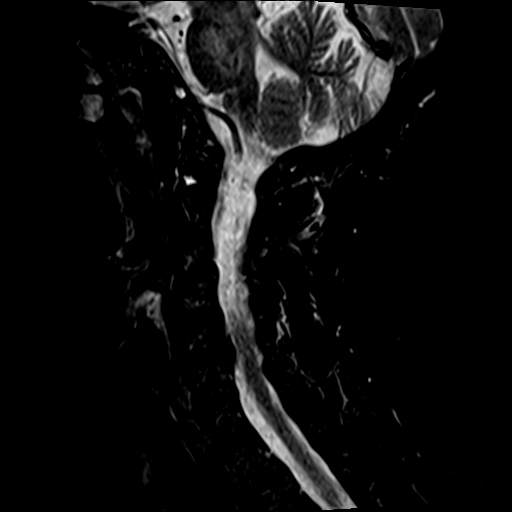
[im 11/13]
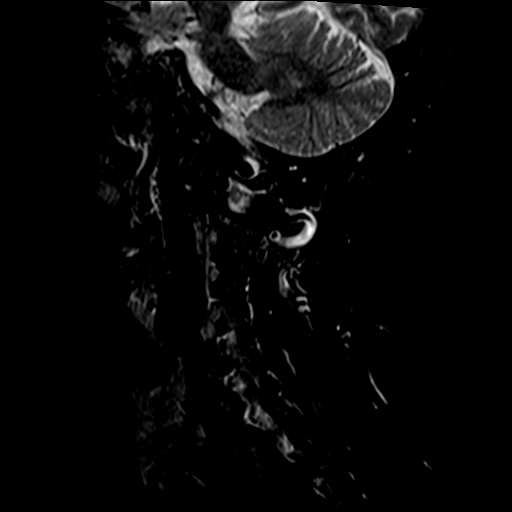
[im 13/13]
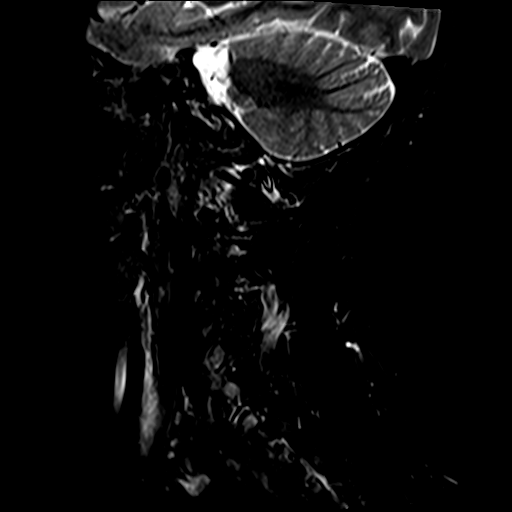

[Series 5: T2 · axial · 3.0mm · 0.62mm/px · z∈[-80,+10]mm · 9 of 25 slices shown (2 of 2)]
[im 1/25]
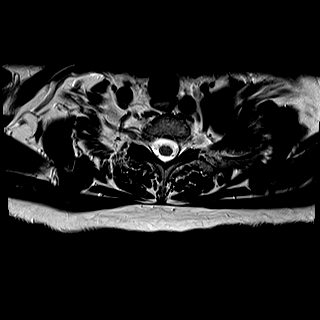
[im 5/25]
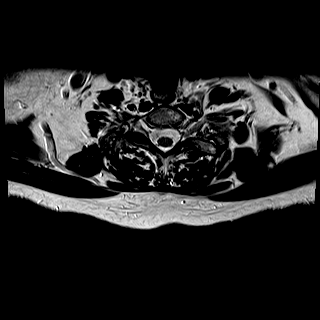
[im 9/25]
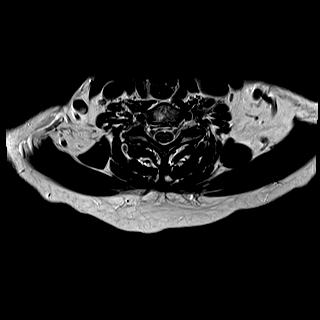
[im 11/25]
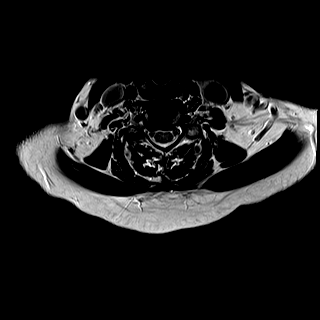
[im 13/25]
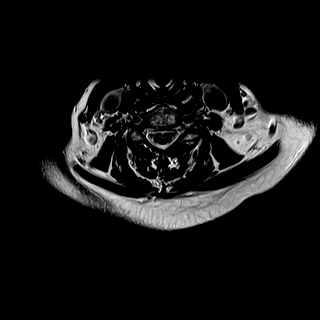
[im 15/25]
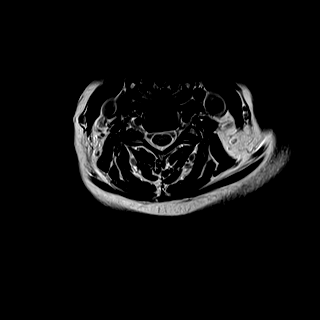
[im 17/25]
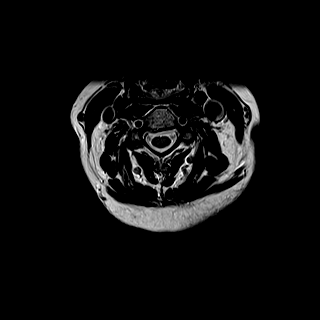
[im 21/25]
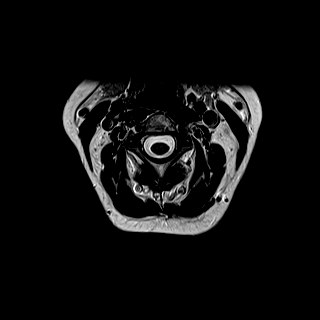
[im 25/25]
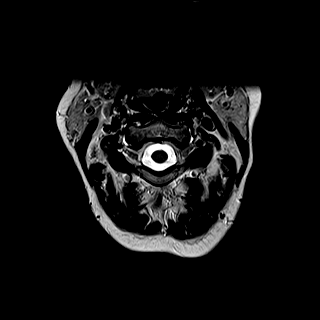

[Series 6: mpgr ax · axial · 3.0mm · 0.35mm/px · z∈[-72,-56]mm · 2 of 25 slices shown]
[im 1/25]
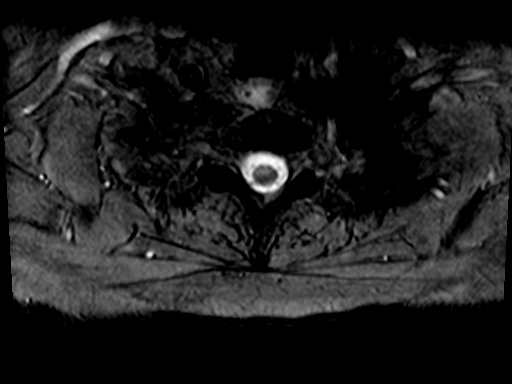
[im 5/25]
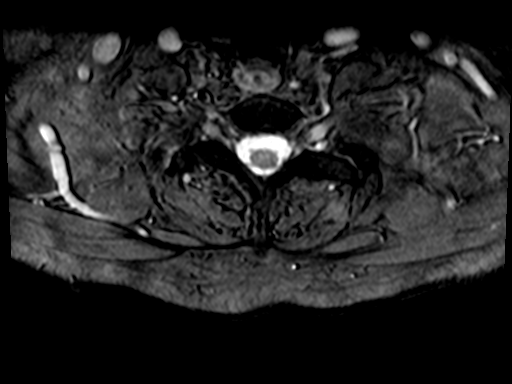

[33 of 48 positions shown; findings below may reference images not displayed]

FINDINGS: Cervical medullary junction unremarkable. Nonspecific white matter
type changes of the pons most likely related to result of small
vessel disease.

Mild motion artifact without discrete cervical cord signal
abnormality.

Visualized paravertebral structures unremarkable.

C2-3:  Negative.

C3-4: Minimal right posterior lateral disc osteophyte with slight
impression right ventral aspect of the thecal sac and minimal
narrowing origin the right neural foramen.

C4-5: Shallow broad-based disc osteophyte complex greater to the
right. Mild narrowing ventral aspect of the thecal sac greater on
right. Very mild narrowing origin of the right neural foramen. New

C5-6: Slightly lobulated broad-based disc osteophyte complex.
Narrowing of the ventral aspect of the thecal sac and mild narrowing
origin of the right neural foramen.

C6-7: Tiny right paracentral/posterior lateral disc osteophyte with
minimal impression upon the ventral thecal sac.

C7-T1:  Minimal bulge.  Minimal bulge slightly greater to the right.

T1-2:  Minimal anterior slip T1.  Minimal bulge.
IMPRESSION: Minimal to mild cervical spondylotic changes most prominent C5-6
level. Summary of pertinent findings includes:

C3-4 minimal right posterior lateral disc osteophyte with slight
impression right ventral aspect of the thecal sac and minimal
narrowing origin the right neural foramen.

C4-5 shallow broad-based disc osteophyte complex greater to the
right. Mild narrowing ventral aspect of the thecal sac greater on
right. Very mild narrowing origin of the right neural foramen. New

C5-6 slightly lobulated broad-based disc osteophyte complex.
Narrowing of the ventral aspect of the thecal sac and mild narrowing
origin of the right neural foramen.

C6-7 tiny right paracentral/posterior lateral disc osteophyte with
minimal impression upon the ventral thecal sac.

## 2017-01-30 DIAGNOSIS — M7989 Other specified soft tissue disorders: Secondary | ICD-10-CM | POA: Diagnosis not present

## 2017-01-30 DIAGNOSIS — G90511 Complex regional pain syndrome I of right upper limb: Secondary | ICD-10-CM | POA: Diagnosis not present

## 2017-01-30 DIAGNOSIS — M79601 Pain in right arm: Secondary | ICD-10-CM | POA: Diagnosis not present

## 2017-02-03 DIAGNOSIS — M7989 Other specified soft tissue disorders: Secondary | ICD-10-CM | POA: Diagnosis not present

## 2017-02-03 DIAGNOSIS — M79601 Pain in right arm: Secondary | ICD-10-CM | POA: Diagnosis not present

## 2017-02-05 DIAGNOSIS — M7989 Other specified soft tissue disorders: Secondary | ICD-10-CM | POA: Diagnosis not present

## 2017-02-05 DIAGNOSIS — M79601 Pain in right arm: Secondary | ICD-10-CM | POA: Diagnosis not present

## 2017-02-10 DIAGNOSIS — M79601 Pain in right arm: Secondary | ICD-10-CM | POA: Diagnosis not present

## 2017-02-10 DIAGNOSIS — M7989 Other specified soft tissue disorders: Secondary | ICD-10-CM | POA: Diagnosis not present

## 2017-02-13 ENCOUNTER — Encounter: Payer: Self-pay | Admitting: Family Medicine

## 2017-02-13 DIAGNOSIS — G90519 Complex regional pain syndrome I of unspecified upper limb: Secondary | ICD-10-CM | POA: Diagnosis not present

## 2017-02-13 DIAGNOSIS — G90511 Complex regional pain syndrome I of right upper limb: Secondary | ICD-10-CM | POA: Diagnosis not present

## 2017-02-13 DIAGNOSIS — M7989 Other specified soft tissue disorders: Secondary | ICD-10-CM | POA: Diagnosis not present

## 2017-02-13 DIAGNOSIS — M79601 Pain in right arm: Secondary | ICD-10-CM | POA: Diagnosis not present

## 2017-02-17 DIAGNOSIS — M79601 Pain in right arm: Secondary | ICD-10-CM | POA: Diagnosis not present

## 2017-02-17 DIAGNOSIS — M7989 Other specified soft tissue disorders: Secondary | ICD-10-CM | POA: Diagnosis not present

## 2017-02-20 DIAGNOSIS — G9050 Complex regional pain syndrome I, unspecified: Secondary | ICD-10-CM | POA: Diagnosis not present

## 2017-02-20 DIAGNOSIS — M7989 Other specified soft tissue disorders: Secondary | ICD-10-CM | POA: Diagnosis not present

## 2017-02-20 DIAGNOSIS — G90511 Complex regional pain syndrome I of right upper limb: Secondary | ICD-10-CM | POA: Diagnosis not present

## 2017-02-20 DIAGNOSIS — M79601 Pain in right arm: Secondary | ICD-10-CM | POA: Diagnosis not present

## 2017-02-25 DIAGNOSIS — M79601 Pain in right arm: Secondary | ICD-10-CM | POA: Diagnosis not present

## 2017-02-25 DIAGNOSIS — M7989 Other specified soft tissue disorders: Secondary | ICD-10-CM | POA: Diagnosis not present

## 2017-02-27 DIAGNOSIS — M79601 Pain in right arm: Secondary | ICD-10-CM | POA: Diagnosis not present

## 2017-02-27 DIAGNOSIS — M7989 Other specified soft tissue disorders: Secondary | ICD-10-CM | POA: Diagnosis not present

## 2017-02-27 DIAGNOSIS — G90511 Complex regional pain syndrome I of right upper limb: Secondary | ICD-10-CM | POA: Diagnosis not present

## 2017-03-03 DIAGNOSIS — M79601 Pain in right arm: Secondary | ICD-10-CM | POA: Diagnosis not present

## 2017-03-03 DIAGNOSIS — M7989 Other specified soft tissue disorders: Secondary | ICD-10-CM | POA: Diagnosis not present

## 2017-03-06 DIAGNOSIS — M7989 Other specified soft tissue disorders: Secondary | ICD-10-CM | POA: Diagnosis not present

## 2017-03-06 DIAGNOSIS — G90511 Complex regional pain syndrome I of right upper limb: Secondary | ICD-10-CM | POA: Diagnosis not present

## 2017-03-06 DIAGNOSIS — M79601 Pain in right arm: Secondary | ICD-10-CM | POA: Diagnosis not present

## 2017-03-10 DIAGNOSIS — M7989 Other specified soft tissue disorders: Secondary | ICD-10-CM | POA: Diagnosis not present

## 2017-03-10 DIAGNOSIS — M79601 Pain in right arm: Secondary | ICD-10-CM | POA: Diagnosis not present

## 2017-03-13 DIAGNOSIS — M7989 Other specified soft tissue disorders: Secondary | ICD-10-CM | POA: Diagnosis not present

## 2017-03-13 DIAGNOSIS — G90511 Complex regional pain syndrome I of right upper limb: Secondary | ICD-10-CM | POA: Diagnosis not present

## 2017-03-13 DIAGNOSIS — M79601 Pain in right arm: Secondary | ICD-10-CM | POA: Diagnosis not present

## 2017-03-17 DIAGNOSIS — M7989 Other specified soft tissue disorders: Secondary | ICD-10-CM | POA: Diagnosis not present

## 2017-03-17 DIAGNOSIS — M79601 Pain in right arm: Secondary | ICD-10-CM | POA: Diagnosis not present

## 2017-03-20 ENCOUNTER — Ambulatory Visit: Payer: Medicare HMO | Admitting: *Deleted

## 2017-03-20 DIAGNOSIS — M7989 Other specified soft tissue disorders: Secondary | ICD-10-CM | POA: Diagnosis not present

## 2017-03-20 DIAGNOSIS — M79601 Pain in right arm: Secondary | ICD-10-CM | POA: Diagnosis not present

## 2017-03-24 DIAGNOSIS — M7989 Other specified soft tissue disorders: Secondary | ICD-10-CM | POA: Diagnosis not present

## 2017-03-24 DIAGNOSIS — M79601 Pain in right arm: Secondary | ICD-10-CM | POA: Diagnosis not present

## 2017-03-27 DIAGNOSIS — G90511 Complex regional pain syndrome I of right upper limb: Secondary | ICD-10-CM | POA: Diagnosis not present

## 2017-03-27 DIAGNOSIS — M7989 Other specified soft tissue disorders: Secondary | ICD-10-CM | POA: Diagnosis not present

## 2017-03-27 DIAGNOSIS — M79601 Pain in right arm: Secondary | ICD-10-CM | POA: Diagnosis not present

## 2017-03-27 NOTE — Progress Notes (Signed)
Subjective:   Stephanie Barnes is a 66 y.o. female who presents for Medicare Annual (Subsequent) preventive examination.  Review of Systems:  No ROS.  Medicare Wellness Visit.  Cardiac Risk Factors include: advanced age (>70men, >57 women);dyslipidemia;sedentary lifestyle Sleep patterns:  Takes Klonopin and pain meds at night which help her sleep. Sleeps 7-8 hrs per night .  Feels rested. Home Safety/Smoke Alarms:  Feels safe in home. Smoke alarms in place.  Living environment; residence and Firearm Safety: Lives alone. No stairs. No guns. Seat Belt Safety/Bike Helmet: Wears seat belt.   Counseling:   Eye Exam- Wears reading glasses. Hx of lasik eye sx. No eye doctor. States no issues. Dental- Dr.Erikson every 3 months.   Female:   Pap-  Last 09/19/16: NEGATIVE FOR INTRAEPITHELIAL LESIONS OR MALIGNANCY     Mammo- Last 10/31/16: BI-RADS CATEGORY  1: Negative.      Dexa scan- Last 07/05/13: osteopenia. Ordered today. CCS-Last 03/15/14: pt reported.      Objective:     Vitals: BP 130/72 (BP Location: Right Arm, Patient Position: Sitting, Cuff Size: Normal)   Pulse 71   Ht 5' (1.524 m)   Wt 135 lb (61.2 kg)   SpO2 99%   BMI 26.37 kg/m   Body mass index is 26.37 kg/m.   Tobacco History  Smoking Status  . Never Smoker  Smokeless Tobacco  . Never Used     Counseling given: Not Answered   Past Medical History:  Diagnosis Date  . Abnormal EKG 06/13/2013  . Anemia 01/13/2016  . Anxiety   . Arthritis   . Complex regional pain syndrome 01/01/2016  . Depression   . 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 Medicare annual wellness visit, subsequent 10/02/2015  . Multiple allergies 03/31/2016  . Rotator cuff tear   . 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 Welcome to Medicare preventive visit 10/02/2015   Past Surgical History:  Procedure Laterality Date  . DENTAL SURGERY     Root cleaning   . REFRACTIVE  SURGERY Bilateral 2000  . ROTATOR CUFF REPAIR Right 2017  . TONSILLECTOMY     Family History  Problem Relation Age of Onset  . Heart disease Mother   . Arthritis Mother   . Heart disease Father   . COPD Father   . Mental illness Brother        bipolar  . Hypertension Brother   . Suicidality Brother   . Alcohol abuse Brother   . Heart disease Maternal Aunt   . Heart disease Maternal Uncle   . Heart disease Paternal Aunt   . COPD Paternal 35   . Heart disease Paternal Uncle   . Heart disease Maternal Grandmother   . Heart disease Maternal Grandfather   . Heart disease Paternal Grandmother   . Heart disease Paternal Grandfather    History  Sexual Activity  . Sexual activity: Not Currently    Comment: lives alone, retired from [employer], no dietary restrictions    Outpatient Encounter Prescriptions as of 03/30/2017  Medication Sig  . Acetaminophen (TYLENOL ARTHRITIS PAIN PO) Take 650 mg by mouth at bedtime.  . Ascorbic Acid (VITAMIN C) 1000 MG tablet Take 1,000 mg by mouth daily.  Marland Kitchen buPROPion (WELLBUTRIN) 75 MG tablet Take 1/2 by mouth daily (Patient taking differently: 75 mg. Take 1/2 by mouth daily)  . Calcium Carbonate-Vit D-Min (CALCIUM 1200 PO) Take 1  tablet by mouth 2 (two) times daily.  . cetirizine (ZYRTEC) 10 MG tablet Take 10 mg by mouth daily as needed.   . chlorhexidine (PERIDEX) 0.12 % solution Use as directed 5 mLs in the mouth or throat at bedtime.   . clonazePAM (KLONOPIN) 1 MG tablet Take 1 tablet (1 mg total) by mouth 3 (three) times daily as needed. for anxiety  . Cyanocobalamin (VITAMIN B 12 PO) Take 1,000 mg by mouth daily.   Marland Kitchen ECHINACEA PO Take 1,200 mg by mouth 2 (two) times daily.  Marland Kitchen gabapentin (NEURONTIN) 300 MG capsule 1 tab po bid and 2 tabs po qhs (Patient taking differently: 1 tab po midday and 2 tabs po qhs)  . HYDROmorphone (DILAUDID) 2 MG tablet Take 2 mg by mouth every 6 (six) hours as needed for severe pain.   Marland Kitchen ibuprofen  (ADVIL,MOTRIN) 200 MG tablet Take 400 mg by mouth at bedtime.  . methocarbamol (ROBAXIN) 500 MG tablet Take 500 mg by mouth every 6 (six) hours as needed for muscle spasms. Takes one in morning and one at night  . mometasone (ELOCON) 0.1 % ointment Apply topically daily. As needed  . Omega-3 Fatty Acids (SUPER OMEGA-3 PO) Take 1,200 mg by mouth 2 (two) times daily.  . pantoprazole (PROTONIX) 40 MG tablet Take 1 tablet (40 mg total) by mouth daily.  Marland Kitchen PENNSAID 2 % SOLN as needed.   . Probiotic Product (PROBIOTIC DAILY PO) Take 1 tablet by mouth daily. NOW 10  . TURMERIC PO Take 894 mg by mouth. GAIA brand-Take 1 in the morning and 1 at night.  . [DISCONTINUED] acetaminophen (TYLENOL) 500 MG tablet Take 500 mg by mouth daily.  Marland Kitchen docusate sodium (COLACE) 100 MG capsule Take 100 mg by mouth as needed.    No facility-administered encounter medications on file as of 03/30/2017.     Activities of Daily Living In your present state of health, do you have any difficulty performing the following activities: 03/30/2017 09/19/2016  Hearing? N N  Vision? N N  Difficulty concentrating or making decisions? N N  Walking or climbing stairs? N N  Dressing or bathing? N N  Doing errands, shopping? N N  Preparing Food and eating ? N -  Using the Toilet? N -  In the past six months, have you accidently leaked urine? N -  Do you have problems with loss of bowel control? N -  Managing your Medications? N -  Managing your Finances? N -  Housekeeping or managing your Housekeeping? N -  Some recent data might be hidden    Patient Care Team: Mosie Lukes, MD as PCP - General (Family Medicine) Juanita Craver, MD as Consulting Physician (Gastroenterology) Percival Spanish, PT as Physical Therapist (Physical Therapy) Susa Day, MD as Consulting Physician (Orthopedic Surgery)    Assessment:    Physical assessment deferred to PCP.  Exercise Activities and Dietary recommendations Current Exercise  Habits: The patient does not participate in regular exercise at present, Exercise limited by: None identified   Diet (meal preparation, eat out, water intake, caffeinated beverages, dairy products, fruits and vegetables): in general, a "healthy" diet    Goals      Patient Stated   . Become more socially active. (pt-stated)    . Maintain healthy eating lifestyle. (pt-stated)      Fall Risk Fall Risk  03/30/2017 09/04/2016 09/13/2015 09/29/2013  Falls in the past year? No No No No   Depression Screen PHQ 2/9 Scores 03/30/2017  09/04/2016 09/13/2015 09/29/2013  PHQ - 2 Score 1 0 0 0     Cognitive Function Ad8 score reviewed for issues:  Issues making decisions:no  Less interest in hobbies / activities:no  Repeats questions, stories (family complaining):no  Trouble using ordinary gadgets (microwave, computer, phone):no  Forgets the month or year: no  Mismanaging finances: no  Remembering appts:no  Daily problems with thinking and/or memory:no Ad8 score is=0         Immunization History  Administered Date(s) Administered  . Influenza, Seasonal, Injecte, Preservative Fre 12/02/2012  . Influenza,inj,Quad PF,36+ Mos 07/31/2014, 10/02/2015, 09/19/2016  . Pneumococcal Conjugate-13 10/02/2015  . Zoster 12/02/2012   Screening Tests Health Maintenance  Topic Date Due  . COLON CANCER SCREENING ANNUAL FOBT  08/01/2015  . PNA vac Low Risk Adult (2 of 2 - PPSV23) 10/01/2016  . INFLUENZA VACCINE  06/10/2017  . MAMMOGRAM  10/31/2018  . TETANUS/TDAP  06/14/2023  . DEXA SCAN  Completed  . Hepatitis C Screening  Completed      Plan:     Schedule appointment for routine follow up with Dr.Blyth.  Continue to eat heart healthy diet (full of fruits, vegetables, whole grains, lean protein, water--limit salt, fat, and sugar intake) and increase physical activity as tolerated.  Continue doing brain stimulating activities (puzzles, reading, adult coloring books, staying active) to  keep memory sharp.   Schedule bone density scan-order placed today.  I have personally reviewed and noted the following in the patient's chart:   . Medical and social history . Use of alcohol, tobacco or illicit drugs  . Current medications and supplements . Functional ability and status . Nutritional status . Physical activity . Advanced directives . List of other physicians . Hospitalizations, surgeries, and ER visits in previous 12 months . Vitals . Screenings to include cognitive, depression, and falls . Referrals and appointments  In addition, I have reviewed and discussed with patient certain preventive protocols, quality metrics, and best practice recommendations. A written personalized care plan for preventive services as well as general preventive health recommendations were provided to patient.     Shela Nevin, [place]  03/30/2017

## 2017-03-30 ENCOUNTER — Encounter: Payer: Self-pay | Admitting: *Deleted

## 2017-03-30 ENCOUNTER — Ambulatory Visit (INDEPENDENT_AMBULATORY_CARE_PROVIDER_SITE_OTHER): Payer: Medicare HMO | Admitting: *Deleted

## 2017-03-30 VITALS — BP 130/72 | HR 71 | Ht 60.0 in | Wt 135.0 lb

## 2017-03-30 DIAGNOSIS — Z0000 Encounter for general adult medical examination without abnormal findings: Secondary | ICD-10-CM

## 2017-03-30 DIAGNOSIS — Z780 Asymptomatic menopausal state: Secondary | ICD-10-CM

## 2017-03-30 NOTE — Patient Instructions (Addendum)
Stephanie Barnes , Thank you for taking time to come for your Medicare Wellness Visit. I appreciate your ongoing commitment to your health goals. Please review the following plan we discussed and let me know if I can assist you in the future.   These are the goals we discussed: Goals      Patient Stated   . Become more socially active. (pt-stated)    . Maintain healthy eating lifestyle. (pt-stated)       This is a list of the screening recommended for you and due dates:  Health Maintenance  Topic Date Due  . Stool Blood Test  08/01/2015  . Pneumonia vaccines (2 of 2 - PPSV23) 10/01/2016  . Flu Shot  06/10/2017  . Mammogram  10/31/2018  . Tetanus Vaccine  06/14/2023  . DEXA scan (bone density measurement)  Completed  .  Hepatitis C: One time screening is recommended by Center for Disease Control  (CDC) for  adults born from [birth year] through 1966.   Completed   Schedule appointment for routine follow up with Dr.Blyth.  Schedule bone density scan-order placed today.  Continue to eat heart healthy diet (full of fruits, vegetables, whole grains, lean protein, water--limit salt, fat, and sugar intake) and increase physical activity as tolerated.  Continue doing brain stimulating activities (puzzles, reading, adult coloring books, staying active) to keep memory sharp.  Health Maintenance for Postmenopausal Women Menopause is a normal process in which your reproductive ability comes to an end. This process happens gradually over a span of months to years, usually between the ages of 94 and 53. Menopause is complete when you have missed 12 consecutive menstrual periods. It is important to talk with your health care provider about some of the most common conditions that affect postmenopausal women, such as heart disease, cancer, and bone loss (osteoporosis). Adopting a healthy lifestyle and getting preventive care can help to promote your health and wellness. Those actions can also lower your chances  of developing some of these common conditions. What should I know about menopause? During menopause, you may experience a number of symptoms, such as:  Moderate-to-severe hot flashes.  Night sweats.  Decrease in sex drive.  Mood swings.  Headaches.  Tiredness.  Irritability.  Memory problems.  Insomnia. Choosing to treat or not to treat menopausal changes is an individual decision that you make with your health care provider. What should I know about hormone replacement therapy and supplements? Hormone therapy products are effective for treating symptoms that are associated with menopause, such as hot flashes and night sweats. Hormone replacement carries certain risks, especially as you become older. If you are thinking about using estrogen or estrogen with progestin treatments, discuss the benefits and risks with your health care provider. What should I know about heart disease and stroke? Heart disease, heart attack, and stroke become more likely as you age. This may be due, in part, to the hormonal changes that your body experiences during menopause. These can affect how your body processes dietary fats, triglycerides, and cholesterol. Heart attack and stroke are both medical emergencies. There are many things that you can do to help prevent heart disease and stroke:  Have your blood pressure checked at least every 1-2 years. High blood pressure causes heart disease and increases the risk of stroke.  If you are 23-34 years old, ask your health care provider if you should take aspirin to prevent a heart attack or a stroke.  Do not use any tobacco products, including cigarettes,  chewing tobacco, or electronic cigarettes. If you need help quitting, ask your health care provider.  It is important to eat a healthy diet and maintain a healthy weight.  Be sure to include plenty of vegetables, fruits, low-fat dairy products, and lean protein.  Avoid eating foods that are high in  solid fats, added sugars, or salt (sodium).  Get regular exercise. This is one of the most important things that you can do for your health.  Try to exercise for at least 150 minutes each week. The type of exercise that you do should increase your heart rate and make you sweat. This is known as moderate-intensity exercise.  Try to do strengthening exercises at least twice each week. Do these in addition to the moderate-intensity exercise.  Know your numbers.Ask your health care provider to check your cholesterol and your blood glucose. Continue to have your blood tested as directed by your health care provider. What should I know about cancer screening? There are several types of cancer. Take the following steps to reduce your risk and to catch any cancer development as early as possible. Breast Cancer  Practice breast self-awareness.  This means understanding how your breasts normally appear and feel.  It also means doing regular breast self-exams. Let your health care provider know about any changes, no matter how small.  If you are 67 or older, have a clinician do a breast exam (clinical breast exam or CBE) every year. Depending on your age, family history, and medical history, it may be recommended that you also have a yearly breast X-ray (mammogram).  If you have a family history of breast cancer, talk with your health care provider about genetic screening.  If you are at high risk for breast cancer, talk with your health care provider about having an MRI and a mammogram every year.  Breast cancer (BRCA) gene test is recommended for women who have family members with BRCA-related cancers. Results of the assessment will determine the need for genetic counseling and BRCA1 and for BRCA2 testing. BRCA-related cancers include these types:  Breast. This occurs in males or females.  Ovarian.  Tubal. This may also be called fallopian tube cancer.  Cancer of the abdominal or pelvic  lining (peritoneal cancer).  Prostate.  Pancreatic. Cervical, Uterine, and Ovarian Cancer  Your health care provider may recommend that you be screened regularly for cancer of the pelvic organs. These include your ovaries, uterus, and vagina. This screening involves a pelvic exam, which includes checking for microscopic changes to the surface of your cervix (Pap test).  For women ages 21-65, health care providers may recommend a pelvic exam and a Pap test every three years. For women ages 32-65, they may recommend the Pap test and pelvic exam, combined with testing for human papilloma virus (HPV), every five years. Some types of HPV increase your risk of cervical cancer. Testing for HPV may also be done on women of any age who have unclear Pap test results.  Other health care providers may not recommend any screening for nonpregnant women who are considered low risk for pelvic cancer and have no symptoms. Ask your health care provider if a screening pelvic exam is right for you.  If you have had past treatment for cervical cancer or a condition that could lead to cancer, you need Pap tests and screening for cancer for at least 20 years after your treatment. If Pap tests have been discontinued for you, your risk factors (such as having a  new sexual partner) need to be reassessed to determine if you should start having screenings again. Some women have medical problems that increase the chance of getting cervical cancer. In these cases, your health care provider may recommend that you have screening and Pap tests more often.  If you have a family history of uterine cancer or ovarian cancer, talk with your health care provider about genetic screening.  If you have vaginal bleeding after reaching menopause, tell your health care provider.  There are currently no reliable tests available to screen for ovarian cancer. Lung Cancer  Lung cancer screening is recommended for adults 60-23 years old who are  at high risk for lung cancer because of a history of smoking. A yearly low-dose CT scan of the lungs is recommended if you:  Currently smoke.  Have a history of at least 30 pack-years of smoking and you currently smoke or have quit within the past 15 years. A pack-year is smoking an average of one pack of cigarettes per day for one year. Yearly screening should:  Continue until it has been 15 years since you quit.  Stop if you develop a health problem that would prevent you from having lung cancer treatment. Colorectal Cancer  This type of cancer can be detected and can often be prevented.  Routine colorectal cancer screening usually begins at age 23 and continues through age 74.  If you have risk factors for colon cancer, your health care provider may recommend that you be screened at an earlier age.  If you have a family history of colorectal cancer, talk with your health care provider about genetic screening.  Your health care provider may also recommend using home test kits to check for hidden blood in your stool.  A small camera at the end of a tube can be used to examine your colon directly (sigmoidoscopy or colonoscopy). This is done to check for the earliest forms of colorectal cancer.  Direct examination of the colon should be repeated every 5-10 years until age 19. However, if early forms of precancerous polyps or small growths are found or if you have a family history or genetic risk for colorectal cancer, you may need to be screened more often. Skin Cancer  Check your skin from head to toe regularly.  Monitor any moles. Be sure to tell your health care provider:  About any new moles or changes in moles, especially if there is a change in a mole's shape or color.  If you have a mole that is larger than the size of a pencil eraser.  If any of your family members has a history of skin cancer, especially at a young age, talk with your health care provider about genetic  screening.  Always use sunscreen. Apply sunscreen liberally and repeatedly throughout the day.  Whenever you are outside, protect yourself by wearing long sleeves, pants, a wide-brimmed hat, and sunglasses. What should I know about osteoporosis? Osteoporosis is a condition in which bone destruction happens more quickly than new bone creation. After menopause, you may be at an increased risk for osteoporosis. To help prevent osteoporosis or the bone fractures that can happen because of osteoporosis, the following is recommended:  If you are 71-37 years old, get at least 1,000 mg of calcium and at least 600 mg of vitamin D per day.  If you are older than age 69 but younger than age 36, get at least 1,200 mg of calcium and at least 600 mg of vitamin  D per day.  If you are older than age 63, get at least 1,200 mg of calcium and at least 800 mg of vitamin D per day. Smoking and excessive alcohol intake increase the risk of osteoporosis. Eat foods that are rich in calcium and vitamin D, and do weight-bearing exercises several times each week as directed by your health care provider. What should I know about how menopause affects my mental health? Depression may occur at any age, but it is more common as you become older. Common symptoms of depression include:  Low or sad mood.  Changes in sleep patterns.  Changes in appetite or eating patterns.  Feeling an overall lack of motivation or enjoyment of activities that you previously enjoyed.  Frequent crying spells. Talk with your health care provider if you think that you are experiencing depression. What should I know about immunizations? It is important that you get and maintain your immunizations. These include:  Tetanus, diphtheria, and pertussis (Tdap) booster vaccine.  Influenza every year before the flu season begins.  Pneumonia vaccine.  Shingles vaccine. Your health care provider may also recommend other immunizations. This  information is not intended to replace advice given to you by your health care provider. Make sure you discuss any questions you have with your health care provider. Document Released: 12/19/2005 Document Revised: 05/16/2016 Document Reviewed: 07/31/2015 Elsevier Interactive Patient Education  2017 [employer].

## 2017-03-31 ENCOUNTER — Other Ambulatory Visit (HOSPITAL_BASED_OUTPATIENT_CLINIC_OR_DEPARTMENT_OTHER): Payer: Medicare HMO

## 2017-04-03 DIAGNOSIS — G90511 Complex regional pain syndrome I of right upper limb: Secondary | ICD-10-CM | POA: Diagnosis not present

## 2017-04-03 DIAGNOSIS — M79601 Pain in right arm: Secondary | ICD-10-CM | POA: Diagnosis not present

## 2017-04-03 DIAGNOSIS — G9050 Complex regional pain syndrome I, unspecified: Secondary | ICD-10-CM | POA: Diagnosis not present

## 2017-04-03 DIAGNOSIS — M7989 Other specified soft tissue disorders: Secondary | ICD-10-CM | POA: Diagnosis not present

## 2017-04-07 ENCOUNTER — Ambulatory Visit (HOSPITAL_BASED_OUTPATIENT_CLINIC_OR_DEPARTMENT_OTHER)
Admission: RE | Admit: 2017-04-07 | Discharge: 2017-04-07 | Disposition: A | Discharge status: Home / Self Care | Payer: Medicare HMO | Source: Ambulatory Visit | Attending: Family Medicine | Admitting: Family Medicine

## 2017-04-07 DIAGNOSIS — M8588 Other specified disorders of bone density and structure, other site: Secondary | ICD-10-CM | POA: Diagnosis not present

## 2017-04-07 DIAGNOSIS — Z780 Asymptomatic menopausal state: Secondary | ICD-10-CM | POA: Diagnosis not present

## 2017-04-07 DIAGNOSIS — M85852 Other specified disorders of bone density and structure, left thigh: Secondary | ICD-10-CM | POA: Diagnosis not present

## 2017-04-09 ENCOUNTER — Encounter: Payer: Self-pay | Admitting: Family Medicine

## 2017-04-09 ENCOUNTER — Other Ambulatory Visit: Payer: Self-pay | Admitting: Family Medicine

## 2017-04-10 DIAGNOSIS — M7989 Other specified soft tissue disorders: Secondary | ICD-10-CM | POA: Diagnosis not present

## 2017-04-10 DIAGNOSIS — G90511 Complex regional pain syndrome I of right upper limb: Secondary | ICD-10-CM | POA: Diagnosis not present

## 2017-04-10 DIAGNOSIS — M79601 Pain in right arm: Secondary | ICD-10-CM | POA: Diagnosis not present

## 2017-04-10 MED ORDER — BUPROPION HCL 75 MG PO TABS: ORAL_TABLET | ORAL | 5 refills | Status: DC

## 2017-04-10 MED ORDER — CLONAZEPAM 1 MG PO TABS: 1.0000 mg | ORAL_TABLET | Freq: Three times a day (TID) | ORAL | 1 refills | Status: DC | PRN

## 2017-04-24 DIAGNOSIS — M7989 Other specified soft tissue disorders: Secondary | ICD-10-CM | POA: Diagnosis not present

## 2017-04-24 DIAGNOSIS — M79601 Pain in right arm: Secondary | ICD-10-CM | POA: Diagnosis not present

## 2017-04-24 DIAGNOSIS — G90511 Complex regional pain syndrome I of right upper limb: Secondary | ICD-10-CM | POA: Diagnosis not present

## 2017-05-08 DIAGNOSIS — G90511 Complex regional pain syndrome I of right upper limb: Secondary | ICD-10-CM | POA: Diagnosis not present

## 2017-05-08 DIAGNOSIS — M7989 Other specified soft tissue disorders: Secondary | ICD-10-CM | POA: Diagnosis not present

## 2017-05-08 DIAGNOSIS — M79601 Pain in right arm: Secondary | ICD-10-CM | POA: Diagnosis not present

## 2017-05-14 ENCOUNTER — Encounter: Payer: Self-pay | Admitting: Family Medicine

## 2017-05-14 ENCOUNTER — Ambulatory Visit (INDEPENDENT_AMBULATORY_CARE_PROVIDER_SITE_OTHER): Payer: Medicare HMO | Admitting: Family Medicine

## 2017-05-14 VITALS — BP 100/68 | HR 64 | Temp 98.0°F | Resp 16 | Ht 60.0 in | Wt 136.2 lb

## 2017-05-14 DIAGNOSIS — E782 Mixed hyperlipidemia: Secondary | ICD-10-CM

## 2017-05-14 DIAGNOSIS — G90511 Complex regional pain syndrome I of right upper limb: Secondary | ICD-10-CM | POA: Diagnosis not present

## 2017-05-14 DIAGNOSIS — K219 Gastro-esophageal reflux disease without esophagitis: Secondary | ICD-10-CM

## 2017-05-14 DIAGNOSIS — M8580 Other specified disorders of bone density and structure, unspecified site: Secondary | ICD-10-CM | POA: Diagnosis not present

## 2017-05-14 DIAGNOSIS — Z23 Encounter for immunization: Secondary | ICD-10-CM | POA: Diagnosis not present

## 2017-05-14 DIAGNOSIS — S46811S Strain of other muscles, fascia and tendons at shoulder and upper arm level, right arm, sequela: Secondary | ICD-10-CM | POA: Diagnosis not present

## 2017-05-14 DIAGNOSIS — D649 Anemia, unspecified: Secondary | ICD-10-CM | POA: Diagnosis not present

## 2017-05-14 DIAGNOSIS — E1169 Type 2 diabetes mellitus with other specified complication: Secondary | ICD-10-CM

## 2017-05-14 DIAGNOSIS — E785 Hyperlipidemia, unspecified: Secondary | ICD-10-CM

## 2017-05-14 NOTE — Progress Notes (Signed)
Patient ID: Stephanie Barnes, female   DOB: 1951/06/12, 65 y.o.   MRN: [id number]     Subjective:  I acted as a Education administrator for Dr. Charlett Blake.  Stephanie Barnes, [name].   Patient ID: Stephanie Barnes, female    DOB: Oct 05, 1951, 66 y.o.   MRN: [id number]  Chief Complaint  Patient presents with  . Hyperlipidemia    HPI  Patient is in today for follow up cholesterol blood work.  She has been controlling it by diet only. 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 She has been following with ortho, icing, bracing and using meds sparingly but pain persists. Denies CP/palp/SOB/HA/congestion/fevers/GI or GU c/o. Taking meds as prescribed  Patient Care Team: Mosie Lukes, MD as PCP - General (Family Medicine) Juanita Craver, MD as Consulting Physician (Gastroenterology) Percival Spanish, PT as Physical Therapist (Physical Therapy) Susa Day, MD as Consulting Physician (Orthopedic Surgery)   Past Medical History:  Diagnosis Date  . Abnormal EKG 06/13/2013  . Anemia 01/13/2016  . Anxiety   . Arthritis   . Complex regional pain syndrome 01/01/2016  . Depression   . 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 Medicare annual wellness visit, subsequent 10/02/2015  . Multiple allergies 03/31/2016  . Rotator cuff tear   . 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 Welcome to Medicare preventive visit 10/02/2015    Past Surgical History:  Procedure Laterality Date  . DENTAL SURGERY     Root cleaning   . REFRACTIVE SURGERY Bilateral 2000  . ROTATOR CUFF REPAIR Right 2017  . TONSILLECTOMY      Family History  Problem Relation Age of Onset  . Heart disease Mother   . Arthritis Mother   . Heart disease Father   . COPD Father   . Mental illness Brother        bipolar  . Hypertension Brother   . Suicidality Brother   . Alcohol abuse Brother   . Heart  disease Maternal Aunt   . Heart disease Maternal Uncle   . Heart disease Paternal Aunt   . COPD Paternal 43   . Heart disease Paternal Uncle   . Heart disease Maternal Grandmother   . Heart disease Maternal Grandfather   . Heart disease Paternal Grandmother   . Heart disease Paternal Grandfather     Social History   Social History  . Marital status: Single    Spouse name: N/A  . Number of children: N/A  . Years of education: N/A   Occupational History  . Not on file.   Social History Main Topics  . Smoking status: Never Smoker  . Smokeless tobacco: Never Used  . Alcohol use 0.6 oz/week    1 Glasses of wine per week     Comment: per month  . Drug use: No  . Sexual activity: Not Currently     Comment: lives alone, retired from [employer], no dietary restrictions   Other Topics Concern  . Not on file   Social History Narrative  . No narrative on file    Outpatient Medications Prior to Visit  Medication Sig Dispense Refill  . Acetaminophen (TYLENOL ARTHRITIS PAIN PO) Take 650 mg by mouth at bedtime.    . Ascorbic Acid (VITAMIN C) 1000 MG tablet Take 1,000 mg by mouth daily.    Marland Kitchen  buPROPion (WELLBUTRIN) 75 MG tablet Take 1/2 by mouth daily 30 tablet 5  . Calcium Carbonate-Vit D-Min (CALCIUM 1200 PO) Take 1 tablet by mouth 2 (two) times daily.    . cetirizine (ZYRTEC) 10 MG tablet Take 10 mg by mouth daily as needed.     . chlorhexidine (PERIDEX) 0.12 % solution Use as directed 5 mLs in the mouth or throat at bedtime.     . clonazePAM (KLONOPIN) 1 MG tablet Take 1 tablet (1 mg total) by mouth 3 (three) times daily as needed. for anxiety 90 tablet 1  . Cyanocobalamin (VITAMIN B 12 PO) Take 1,000 mg by mouth daily.     Marland Kitchen ECHINACEA PO Take 1,200 mg by mouth 2 (two) times daily.    Marland Kitchen gabapentin (NEURONTIN) 300 MG capsule 1 tab po bid and 2 tabs po qhs (Patient taking differently: 1 tab po midday and 2 tabs po qhs) 120 capsule 3  . HYDROmorphone (DILAUDID) 2 MG tablet  Take 2 mg by mouth every evening.     . methocarbamol (ROBAXIN) 500 MG tablet Take 500 mg by mouth every morning. Takes one in morning and one at night    . mometasone (ELOCON) 0.1 % ointment Apply topically daily. As needed 15 g 0  . Omega-3 Fatty Acids (SUPER OMEGA-3 PO) Take 1,200 mg by mouth 2 (two) times daily.    . pantoprazole (PROTONIX) 40 MG tablet Take 1 tablet (40 mg total) by mouth daily. 90 tablet 1  . PENNSAID 2 % SOLN as needed.     . Probiotic Product (PROBIOTIC DAILY PO) Take 1 tablet by mouth daily. NOW 10    . TURMERIC PO Take 894 mg by mouth. GAIA brand-Take 1 in the morning and 1 at night.    . docusate sodium (COLACE) 100 MG capsule Take 100 mg by mouth as needed.     Marland Kitchen ibuprofen (ADVIL,MOTRIN) 200 MG tablet Take 400 mg by mouth at bedtime.     No facility-administered medications prior to visit.     Allergies  Allergen Reactions  . Aspirin Anaphylaxis and Swelling    Swelling of lips and fingers   . Celebrex [Celecoxib] Anaphylaxis, Hives and Shortness Of Breath  . Doxycycline Other (See Comments)    Scared throat   . Prednisone Shortness Of Breath and Other (See Comments)    Chest tightness   . Celexa [Citalopram] Other (See Comments)    Shakiness   . Fosamax [Alendronate Sodium] Hives  . Clindamycin/Lincomycin Nausea And Vomiting  . Other Hives and Nausea Only    Arthritec    Review of Systems  Constitutional: Negative for fever and malaise/fatigue.  HENT: Negative for congestion.   Eyes: Negative for blurred vision.  Respiratory: Negative for cough and shortness of breath.   Cardiovascular: Negative for chest pain, palpitations and leg swelling.  Gastrointestinal: Negative for vomiting.  Musculoskeletal: Negative for back pain.  Skin: Negative for rash.  Neurological: Negative for loss of consciousness and headaches.       Objective:    Physical Exam  Constitutional: She is oriented to person, place, and time. She appears well-developed and  well-nourished. No distress.  HENT:  Head: Normocephalic and atraumatic.  Nose: Nose normal.  Eyes: Conjunctivae are normal. Right eye exhibits no discharge. Left eye exhibits no discharge.  Neck: Normal range of motion. Neck supple. No thyromegaly present.  Cardiovascular: Normal rate and regular rhythm.   No murmur heard. Pulmonary/Chest: Effort normal and breath sounds normal. She has  no wheezes.  Abdominal: Soft. Bowel sounds are normal. There is no tenderness.  Musculoskeletal: Normal range of motion. She exhibits no edema or deformity.  Neurological: She is alert and oriented to person, place, and time.  Skin: Skin is warm and dry. She is not diaphoretic.  Psychiatric: She has a normal mood and affect.  Nursing note and vitals reviewed.   BP 100/68 (BP Location: Right Arm, Cuff Size: Normal)   Pulse 64   Temp 98 F (36.7 C) (Oral)   Resp 16   Ht 5' (1.524 m)   Wt 136 lb 3.2 oz (61.8 kg)   SpO2 98%   BMI 26.60 kg/m  Wt Readings from Last 3 Encounters:  05/14/17 136 lb 3.2 oz (61.8 kg)  03/30/17 135 lb (61.2 kg)  09/19/16 138 lb 4 oz (62.7 kg)   BP Readings from Last 3 Encounters:  05/14/17 100/68  03/30/17 130/72  09/19/16 108/78     Immunization History  Administered Date(s) Administered  . Influenza, Seasonal, Injecte, Preservative Fre 12/02/2012  . Influenza,inj,Quad PF,36+ Mos 07/31/2014, 10/02/2015, 09/19/2016  . Pneumococcal Conjugate-13 10/02/2015  . Pneumococcal Polysaccharide-23 05/14/2017  . Zoster 12/02/2012    Health Maintenance  Topic Date Due  . HEMOGLOBIN A1C  08-07-1951  . FOOT EXAM  07/17/1960  . OPHTHALMOLOGY EXAM  07/17/1960  . URINE MICROALBUMIN  07/17/1960  . COLON CANCER SCREENING ANNUAL FOBT  08/01/2015  . INFLUENZA VACCINE  06/10/2017  . MAMMOGRAM  10/31/2018  . TETANUS/TDAP  06/14/2023  . DEXA SCAN  Completed  . Hepatitis C Screening  Completed  . PNA vac Low Risk Adult  Completed    Lab Results  Component Value Date   WBC  4.9 05/14/2017   HGB 12.0 05/14/2017   HCT 35.9 (L) 05/14/2017   PLT 199.0 05/14/2017   GLUCOSE 96 05/14/2017   CHOL 212 (H) 05/14/2017   TRIG 65.0 05/14/2017   HDL 73.40 05/14/2017   LDLCALC 126 (H) 05/14/2017   ALT 10 05/14/2017   AST 12 05/14/2017   NA 137 05/14/2017   K 4.7 05/14/2017   CL 104 05/14/2017   CREATININE 0.85 05/14/2017   BUN 28 (H) 05/14/2017   CO2 27 05/14/2017   TSH 1.36 05/14/2017    Lab Results  Component Value Date   TSH 1.36 05/14/2017   Lab Results  Component Value Date   WBC 4.9 05/14/2017   HGB 12.0 05/14/2017   HCT 35.9 (L) 05/14/2017   MCV 90.7 05/14/2017   PLT 199.0 05/14/2017   Lab Results  Component Value Date   NA 137 05/14/2017   K 4.7 05/14/2017   CO2 27 05/14/2017   GLUCOSE 96 05/14/2017   BUN 28 (H) 05/14/2017   CREATININE 0.85 05/14/2017   BILITOT 0.3 05/14/2017   ALKPHOS 53 05/14/2017   AST 12 05/14/2017   ALT 10 05/14/2017   PROT 7.3 05/14/2017   ALBUMIN 4.3 05/14/2017   CALCIUM 9.7 05/14/2017   ANIONGAP 8 12/26/2015   GFR 70.94 05/14/2017   Lab Results  Component Value Date   CHOL 212 (H) 05/14/2017   Lab Results  Component Value Date   HDL 73.40 05/14/2017   Lab Results  Component Value Date   LDLCALC 126 (H) 05/14/2017   Lab Results  Component Value Date   TRIG 65.0 05/14/2017   Lab Results  Component Value Date   CHOLHDL 3 05/14/2017   No results found for: HGBA1C       Assessment & Plan:  Problem List Items Addressed This Visit    Complex regional pain syndrome type I (Location of Primary Source of Pain) (upper extremity) (Right) (Chronic)    Is now noting trouble on left and is following very closely with ortho including Dr Isaiah Blakes. She is using topical treatments, nerve blocks, pain meds      GERD (gastroesophageal reflux disease)    Avoid offending foods, start probiotics. Do not eat large meals in late evening and consider raising head of bed.       Relevant Orders   Comprehensive  metabolic panel (Completed)   Hyperlipidemia    Encouraged heart healthy diet, increase exercise, avoid trans fats, consider a krill oil cap daily      Osteopenia    Encouraged to get adequate exercise, calcium and vitamin d intake      Anemia    Mild, Increase leafy greens, consider increased lean red meat and using cast iron cookware. Continue to monitor, report any concerns      Relevant Orders   CBC (Completed)   Infraspinatus tendon tear, right, sequela    Other Visit Diagnoses    Hyperlipidemia associated with type 2 diabetes mellitus ([place])    -  Primary   Relevant Orders   Lipid panel (Completed)   TSH (Completed)   Need for pneumococcal vaccination       Relevant Orders   Pneumococcal polysaccharide vaccine 23-valent greater than or equal to 2yo subcutaneous/IM (Completed)      I have discontinued Ms. Keckler's docusate sodium and ibuprofen. I am also having her maintain her Probiotic Product (PROBIOTIC DAILY PO), Calcium Carbonate-Vit D-Min (CALCIUM 1200 PO), cetirizine, mometasone, HYDROmorphone, methocarbamol, chlorhexidine, gabapentin, Cyanocobalamin (VITAMIN B 12 PO), vitamin C, TURMERIC PO, PENNSAID, Omega-3 Fatty Acids (SUPER OMEGA-3 PO), Acetaminophen (TYLENOL ARTHRITIS PAIN PO), ECHINACEA PO, pantoprazole, clonazePAM, and buPROPion.  No orders of the defined types were placed in this encounter.     Penni Homans, MD

## 2017-05-14 NOTE — Patient Instructions (Signed)

## 2017-05-14 NOTE — Progress Notes (Signed)
Patient ID: Stephanie Barnes, female   DOB: [birth year], 66 y.o.   MRN: [id number]

## 2017-05-14 NOTE — Assessment & Plan Note (Signed)
Avoid offending foods, start probiotics. Do not eat large meals in late evening and consider raising head of bed.  

## 2017-05-15 LAB — COMPREHENSIVE METABOLIC PANEL
ALT: 10 U/L (ref 0–35)
AST: 12 U/L (ref 0–37)
Albumin: 4.3 g/dL (ref 3.5–5.2)
Alkaline Phosphatase: 53 U/L (ref 39–117)
BUN: 28 mg/dL — AB (ref 6–23)
CHLORIDE: 104 meq/L (ref 96–112)
CO2: 27 meq/L (ref 19–32)
Calcium: 9.7 mg/dL (ref 8.4–10.5)
Creatinine, Ser: 0.85 mg/dL (ref 0.40–1.20)
GFR: 70.94 mL/min (ref >60.00)
GLUCOSE: 96 mg/dL (ref 70–99)
POTASSIUM: 4.7 meq/L (ref 3.5–5.1)
SODIUM: 137 meq/L (ref 135–145)
Total Bilirubin: 0.3 mg/dL (ref 0.2–1.2)
Total Protein: 7.3 g/dL (ref 6.0–8.3)

## 2017-05-15 LAB — CBC
HEMATOCRIT: 35.9 % — AB (ref 36.0–46.0)
Hemoglobin: 12 g/dL (ref 12.0–15.0)
MCHC: 33.4 g/dL (ref 30.0–36.0)
MCV: 90.7 fl (ref 78.0–100.0)
Platelets: 199 10*3/uL (ref 150.0–400.0)
RBC: 3.96 Mil/uL (ref 3.87–5.11)
RDW: 13 % (ref 11.5–15.5)
WBC: 4.9 10*3/uL (ref 4.0–10.5)

## 2017-05-15 LAB — LIPID PANEL
CHOL/HDL RATIO: 3
CHOLESTEROL: 212 mg/dL — AB (ref 0–200)
HDL: 73.4 mg/dL (ref >39.00)
LDL CALC: 126 mg/dL — AB (ref 0–99)
NONHDL: 139.05
Triglycerides: 65 mg/dL (ref 0.0–149.0)
VLDL: 13 mg/dL (ref 0.0–40.0)

## 2017-05-15 LAB — TSH: TSH: 1.36 u[IU]/mL (ref 0.35–4.50)

## 2017-05-17 NOTE — Assessment & Plan Note (Signed)
Encouraged heart healthy diet, increase exercise, avoid trans fats, consider a krill oil cap daily 

## 2017-05-17 NOTE — Assessment & Plan Note (Signed)
Is now noting trouble on left and is following very closely with ortho including Dr Isaiah Blakes. She is using topical treatments, nerve blocks, pain meds

## 2017-05-17 NOTE — Assessment & Plan Note (Signed)
Encouraged to get adequate exercise, calcium and vitamin d intake 

## 2017-05-17 NOTE — Assessment & Plan Note (Signed)
Mild, Increase leafy greens, consider increased lean red meat and using cast iron cookware. Continue to monitor, report any concerns 

## 2017-05-19 DIAGNOSIS — M79601 Pain in right arm: Secondary | ICD-10-CM | POA: Diagnosis not present

## 2017-05-19 DIAGNOSIS — M7989 Other specified soft tissue disorders: Secondary | ICD-10-CM | POA: Diagnosis not present

## 2017-05-19 DIAGNOSIS — G5641 Causalgia of right upper limb: Secondary | ICD-10-CM | POA: Diagnosis not present

## 2017-05-29 DIAGNOSIS — G90511 Complex regional pain syndrome I of right upper limb: Secondary | ICD-10-CM | POA: Diagnosis not present

## 2017-05-29 DIAGNOSIS — M79601 Pain in right arm: Secondary | ICD-10-CM | POA: Diagnosis not present

## 2017-05-29 DIAGNOSIS — M7989 Other specified soft tissue disorders: Secondary | ICD-10-CM | POA: Diagnosis not present

## 2017-06-08 ENCOUNTER — Encounter (INDEPENDENT_AMBULATORY_CARE_PROVIDER_SITE_OTHER): Payer: Self-pay | Admitting: Physical Medicine and Rehabilitation

## 2017-06-12 ENCOUNTER — Encounter (INDEPENDENT_AMBULATORY_CARE_PROVIDER_SITE_OTHER): Payer: Self-pay | Admitting: Physical Medicine and Rehabilitation

## 2017-06-12 ENCOUNTER — Ambulatory Visit (INDEPENDENT_AMBULATORY_CARE_PROVIDER_SITE_OTHER): Payer: Medicare HMO | Admitting: Physical Medicine and Rehabilitation

## 2017-06-12 ENCOUNTER — Ambulatory Visit (INDEPENDENT_AMBULATORY_CARE_PROVIDER_SITE_OTHER): Payer: Self-pay

## 2017-06-12 VITALS — BP 131/78 | HR 62 | Temp 98.3°F

## 2017-06-12 DIAGNOSIS — M7989 Other specified soft tissue disorders: Secondary | ICD-10-CM | POA: Diagnosis not present

## 2017-06-12 DIAGNOSIS — G90511 Complex regional pain syndrome I of right upper limb: Secondary | ICD-10-CM

## 2017-06-12 DIAGNOSIS — M79601 Pain in right arm: Secondary | ICD-10-CM | POA: Diagnosis not present

## 2017-06-12 MED ORDER — BUPIVACAINE HCL 0.25 % IJ SOLN
2.0000 mL | Freq: Once | INTRAMUSCULAR | Status: AC
  Administered 2017-06-12: 2 mL

## 2017-06-12 MED ORDER — LIDOCAINE HCL (PF) 1 % IJ SOLN
2.0000 mL | Freq: Once | INTRAMUSCULAR | Status: AC
  Administered 2017-06-12: 2 mL

## 2017-06-12 NOTE — Progress Notes (Deleted)
Can use alcohol or betadine to clean any thing else makes her have a burning sensation.  She that she is hurting in rt shoulder, CRPS that has jumped to her knuckles, shoulder pain 7/10, and knuckles 4/10.  No Driver today----leaving here going to [facility] for Manipulation immediately after injection.

## 2017-06-12 NOTE — Patient Instructions (Signed)

## 2017-06-12 NOTE — Progress Notes (Unsigned)
Fluoro time:45 sec MGy:9.72

## 2017-06-13 NOTE — Procedures (Signed)
[name] is a 66 year old female with chronic right shoulder pain and complex regional pain syndrome type I. She is followed by Dr. Nelva Bush is completed numerous stellate ganglion blocks on the right. She was originally seen by Dr. Barbette Hair who performs them as well. While Dr. Nelva Bush is out of 10 and we agreed to complete a right stellate ganglion block for her ongoing complex regional pain syndrome. She always follow this up with physical therapy and manipulation which she is going to today. She did come today without a driver. In the future if we were to see her she would need a driver to go home we'll watch her observation after the injection anyway. Accessing the patient on one other occasion and completed a right stellate ganglion block at the surgery center. She feels like she will be able to undergo this today in the office without difficulty. She did make interesting comment that doing the stellate ganglion blocks away Dr. Nelva Bush and I do them she does not receive is much worse type syndrome during the block as she used to receive with Dr. Barbette Hair did the procedure more from a midline approach. I did tell her he may do that at a different level and may be different volume of medication. The key is watching the fluoroscopic flow of contrast to see if that spreads down to the T1 level.   Stellate Ganglion Block with Fluoroscopic Guidance  Patient: Stephanie Barnes      Date of Birth: December 31, 1950 MRN: [id number] PCP: Mosie Lukes, MD      Visit Date: 06/12/2017   Universal Protocol:    Date/Time: 08/04/187:12 AM  Consent Given By: the patient  Position:  SUPINE   Additional Comments: Vital signs were monitored before and after the procedure. Patient was prepped and draped in the usual sterile fashion. The correct patient, procedure, and site was verified.   Injection Procedure Details:  Procedure Site One Meds Administered:  Meds ordered this encounter  Medications  . lidocaine  (PF) (XYLOCAINE) 1 % injection 2 mL  . bupivacaine (MARCAINE) 0.25 % (with pres) injection 2 mL     Laterality: Right  Location/Site:  C6-7  Needle size: 25 G  Needle type: spinal needle  Needle Placement: On the anterior surface of the C6 vertebral body at the uncovertebral junction  Findings:  -Contrast Used: 2 mL iohexol 180 mg iodine/mL   -Comments: There was excellent flow of contrast and needle placement just at the uncinate process line with flow of contrast superiorly and inferiorly down to the T1 level. There was no intravascular flow.  Procedure Details: The C-arm was initially positioned in a caudal tilt with a 45 obliquity to the side mentioned above. The C-arm was then positioned to achieve the largest diameter of the foramen as well as adjusting the tilt to achieve the squared off joint line at the C6 vertebral body.  The region overlying the C6 vertebral body at or just medial to the uncovertebral joint line was localized under fluoroscopic guidance and the skin over the target area was anesthetized with 1 ml of 1% Lidocaine without epinephrine. A #25 gauge, bent spinal needle was inserted down to the target area of the C6 vertebral body using biplanar imaging. Needle placement was verified both in an oblique view and AP view which is the safety view. A 2-3 ml. volume of Omnipaque-240 was injected. No intravascular injection was seen. Radiographs were obtained for documentation purposes. Then, a 1.0 ml. volume of 0.25%  Marcaine was slowly injected after negative aspirate.  Additional Omnipaque was injected again to confirm the position followed by Marcaine for a total of three more times over a 5-7 minute period. The vital signs remained stable and the patient did not have any metallic taste sensation. Throughout the procedure, a total of 5 ml. of 0.25% Marcaine was injected.   Additional Comments:  The patient tolerated the procedure well Dressing: Band-Aid     Post-procedure details: Patient was observed during the procedure. Post-procedure instructions were reviewed. Patient left the clinic in stable condition.

## 2017-06-23 DIAGNOSIS — G5641 Causalgia of right upper limb: Secondary | ICD-10-CM | POA: Diagnosis not present

## 2017-06-23 DIAGNOSIS — M79601 Pain in right arm: Secondary | ICD-10-CM | POA: Diagnosis not present

## 2017-06-23 DIAGNOSIS — M7989 Other specified soft tissue disorders: Secondary | ICD-10-CM | POA: Diagnosis not present

## 2017-06-30 DIAGNOSIS — M7989 Other specified soft tissue disorders: Secondary | ICD-10-CM | POA: Diagnosis not present

## 2017-06-30 DIAGNOSIS — G5641 Causalgia of right upper limb: Secondary | ICD-10-CM | POA: Diagnosis not present

## 2017-06-30 DIAGNOSIS — M79601 Pain in right arm: Secondary | ICD-10-CM | POA: Diagnosis not present

## 2017-07-07 DIAGNOSIS — G5641 Causalgia of right upper limb: Secondary | ICD-10-CM | POA: Diagnosis not present

## 2017-07-07 DIAGNOSIS — M79601 Pain in right arm: Secondary | ICD-10-CM | POA: Diagnosis not present

## 2017-07-07 DIAGNOSIS — M7989 Other specified soft tissue disorders: Secondary | ICD-10-CM | POA: Diagnosis not present

## 2017-07-12 ENCOUNTER — Encounter: Payer: Self-pay | Admitting: Family Medicine

## 2017-07-14 ENCOUNTER — Other Ambulatory Visit: Payer: Self-pay | Admitting: Family Medicine

## 2017-07-14 MED ORDER — PANTOPRAZOLE SODIUM 40 MG PO TBEC: 40.0000 mg | DELAYED_RELEASE_TABLET | Freq: Every day | ORAL | 1 refills | Status: DC

## 2017-07-15 DIAGNOSIS — M79601 Pain in right arm: Secondary | ICD-10-CM | POA: Diagnosis not present

## 2017-07-15 DIAGNOSIS — M7989 Other specified soft tissue disorders: Secondary | ICD-10-CM | POA: Diagnosis not present

## 2017-07-23 DIAGNOSIS — M79601 Pain in right arm: Secondary | ICD-10-CM | POA: Diagnosis not present

## 2017-07-23 DIAGNOSIS — M792 Neuralgia and neuritis, unspecified: Secondary | ICD-10-CM | POA: Diagnosis not present

## 2017-07-23 DIAGNOSIS — M7989 Other specified soft tissue disorders: Secondary | ICD-10-CM | POA: Diagnosis not present

## 2017-07-27 ENCOUNTER — Other Ambulatory Visit: Payer: Self-pay

## 2017-07-27 MED ORDER — PANTOPRAZOLE SODIUM 40 MG PO TBEC: 40.0000 mg | DELAYED_RELEASE_TABLET | Freq: Every day | ORAL | 1 refills | Status: DC

## 2017-07-30 DIAGNOSIS — M79601 Pain in right arm: Secondary | ICD-10-CM | POA: Diagnosis not present

## 2017-07-30 DIAGNOSIS — M7989 Other specified soft tissue disorders: Secondary | ICD-10-CM | POA: Diagnosis not present

## 2017-07-30 DIAGNOSIS — G5621 Lesion of ulnar nerve, right upper limb: Secondary | ICD-10-CM | POA: Diagnosis not present

## 2017-08-06 DIAGNOSIS — M79601 Pain in right arm: Secondary | ICD-10-CM | POA: Diagnosis not present

## 2017-08-06 DIAGNOSIS — M7989 Other specified soft tissue disorders: Secondary | ICD-10-CM | POA: Diagnosis not present

## 2017-08-06 DIAGNOSIS — G5621 Lesion of ulnar nerve, right upper limb: Secondary | ICD-10-CM | POA: Diagnosis not present

## 2017-08-10 HISTORY — PX: MOUTH SURGERY: SHX715

## 2017-08-12 DIAGNOSIS — R69 Illness, unspecified: Secondary | ICD-10-CM | POA: Diagnosis not present

## 2017-08-13 DIAGNOSIS — M79601 Pain in right arm: Secondary | ICD-10-CM | POA: Diagnosis not present

## 2017-08-20 DIAGNOSIS — M79601 Pain in right arm: Secondary | ICD-10-CM | POA: Diagnosis not present

## 2017-08-20 DIAGNOSIS — M7989 Other specified soft tissue disorders: Secondary | ICD-10-CM | POA: Diagnosis not present

## 2017-08-27 DIAGNOSIS — M79601 Pain in right arm: Secondary | ICD-10-CM | POA: Diagnosis not present

## 2017-08-27 DIAGNOSIS — M7989 Other specified soft tissue disorders: Secondary | ICD-10-CM | POA: Diagnosis not present

## 2017-09-03 DIAGNOSIS — M79601 Pain in right arm: Secondary | ICD-10-CM | POA: Diagnosis not present

## 2017-09-03 DIAGNOSIS — G5621 Lesion of ulnar nerve, right upper limb: Secondary | ICD-10-CM | POA: Diagnosis not present

## 2017-09-03 DIAGNOSIS — M7989 Other specified soft tissue disorders: Secondary | ICD-10-CM | POA: Diagnosis not present

## 2017-09-10 DIAGNOSIS — M79601 Pain in right arm: Secondary | ICD-10-CM | POA: Diagnosis not present

## 2017-09-10 DIAGNOSIS — M7989 Other specified soft tissue disorders: Secondary | ICD-10-CM | POA: Diagnosis not present

## 2017-09-17 DIAGNOSIS — M7989 Other specified soft tissue disorders: Secondary | ICD-10-CM | POA: Diagnosis not present

## 2017-09-17 DIAGNOSIS — M79601 Pain in right arm: Secondary | ICD-10-CM | POA: Diagnosis not present

## 2017-09-24 DIAGNOSIS — M7989 Other specified soft tissue disorders: Secondary | ICD-10-CM | POA: Diagnosis not present

## 2017-09-24 DIAGNOSIS — M79601 Pain in right arm: Secondary | ICD-10-CM | POA: Diagnosis not present

## 2017-10-05 ENCOUNTER — Other Ambulatory Visit: Payer: Self-pay | Admitting: Family Medicine

## 2017-10-07 ENCOUNTER — Encounter: Payer: Self-pay | Admitting: Family Medicine

## 2017-10-08 DIAGNOSIS — M79601 Pain in right arm: Secondary | ICD-10-CM | POA: Diagnosis not present

## 2017-10-08 DIAGNOSIS — M7989 Other specified soft tissue disorders: Secondary | ICD-10-CM | POA: Diagnosis not present

## 2017-10-08 MED ORDER — CLONAZEPAM 1 MG PO TABS: 1.0000 mg | ORAL_TABLET | Freq: Three times a day (TID) | ORAL | 1 refills | Status: DC | PRN

## 2017-10-08 NOTE — Telephone Encounter (Signed)
Pt is requesting refill on clonazepam.   Last OV: 05/14/2017 Last Fill: 04/10/2017 #90 and 1RF UDS: 01/13/2017 Low risk  Please advise.

## 2017-10-08 NOTE — Telephone Encounter (Signed)
Rx phoned into [facility] prescriber line.

## 2017-10-13 ENCOUNTER — Ambulatory Visit (INDEPENDENT_AMBULATORY_CARE_PROVIDER_SITE_OTHER): Payer: Medicare HMO | Admitting: Family Medicine

## 2017-10-13 ENCOUNTER — Encounter: Payer: Self-pay | Admitting: Family Medicine

## 2017-10-13 VITALS — BP 124/66 | HR 77 | Temp 98.0°F | Resp 16 | Ht 59.84 in | Wt 136.0 lb

## 2017-10-13 DIAGNOSIS — D649 Anemia, unspecified: Secondary | ICD-10-CM

## 2017-10-13 DIAGNOSIS — E782 Mixed hyperlipidemia: Secondary | ICD-10-CM

## 2017-10-13 DIAGNOSIS — G90511 Complex regional pain syndrome I of right upper limb: Secondary | ICD-10-CM | POA: Diagnosis not present

## 2017-10-13 DIAGNOSIS — H547 Unspecified visual loss: Secondary | ICD-10-CM | POA: Diagnosis not present

## 2017-10-13 DIAGNOSIS — L578 Other skin changes due to chronic exposure to nonionizing radiation: Secondary | ICD-10-CM | POA: Diagnosis not present

## 2017-10-13 DIAGNOSIS — K219 Gastro-esophageal reflux disease without esophagitis: Secondary | ICD-10-CM

## 2017-10-13 DIAGNOSIS — Z0000 Encounter for general adult medical examination without abnormal findings: Secondary | ICD-10-CM

## 2017-10-13 MED ORDER — GABAPENTIN 300 MG PO CAPS: 600.0000 mg | ORAL_CAPSULE | Freq: Every day | ORAL | 5 refills | Status: DC

## 2017-10-13 NOTE — Assessment & Plan Note (Signed)
Continues to struggle and gets a weekly nerve block and is trying to stay off of most pain meds only takes a pain med/Dilaudid and Robaxin on the morning she gets the nerve block and then goes to physical therapy manipulations and she gets in a lot of pain after that if she does not take many meds. Takes Gabapentin still. Now takes 2 at night only.

## 2017-10-13 NOTE — Assessment & Plan Note (Signed)
Encouraged heart healthy diet, increase exercise, avoid trans fats, consider a krill oil cap daily 

## 2017-10-13 NOTE — Patient Instructions (Addendum)
Consider Tylenol/Acetaminophen ES 500 tabs 1-2 tabs twice daily, (max of 3000 mg in a day or 6 tabs in 24 hours) Lidocaine gel or patches by [employer], [employer] or [employer] companies  Table top pedals  Consider dropping Pantoprazole to every other day and then every 3rd day as tolerated and can use ranitidine as needed if not tolerated   Preventive Care 67 Years and Older, Female Preventive care refers to lifestyle choices and visits with your health care provider that can promote health and wellness. What does preventive care include?  A yearly physical exam. This is also called an annual well check.  Dental exams once or twice a year.  Routine eye exams. Ask your health care provider how often you should have your eyes checked.  Personal lifestyle choices, including: ? Daily care of your teeth and gums. ? Regular physical activity. ? Eating a healthy diet. ? Avoiding tobacco and drug use. ? Limiting alcohol use. ? Practicing safe sex. ? Taking low-dose aspirin every day. ? Taking vitamin and mineral supplements as recommended by your health care provider. What happens during an annual well check? The services and screenings done by your health care provider during your annual well check will depend on your age, overall health, lifestyle risk factors, and family history of disease. Counseling Your health care provider may ask you questions about your:  Alcohol use.  Tobacco use.  Drug use.  Emotional well-being.  Home and relationship well-being.  Sexual activity.  Eating habits.  History of falls.  Memory and ability to understand (cognition).  Work and work Statistician.  Reproductive health.  Screening You may have the following tests or measurements:  Height, weight, and BMI.  Blood pressure.  Lipid and cholesterol levels. These may be checked every 5 years, or more frequently if you are over 83 years old.  Skin check.  Lung cancer screening. You  may have this screening every year starting at age 62 if you have a 30-pack-year history of smoking and currently smoke or have quit within the past 15 years.  Fecal occult blood test (FOBT) of the stool. You may have this test every year starting at age 53.  Flexible sigmoidoscopy or colonoscopy. You may have a sigmoidoscopy every 5 years or a colonoscopy every 10 years starting at age 86.  Hepatitis C blood test.  Hepatitis B blood test.  Sexually transmitted disease (STD) testing.  Diabetes screening. This is done by checking your blood sugar (glucose) after you have not eaten for a while (fasting). You may have this done every 1-3 years.  Bone density scan. This is done to screen for osteoporosis. You may have this done starting at age 47.  Mammogram. This may be done every 1-2 years. Talk to your health care provider about how often you should have regular mammograms.  Talk with your health care provider about your test results, treatment options, and if necessary, the need for more tests. Vaccines Your health care provider may recommend certain vaccines, such as:  Influenza vaccine. This is recommended every year.  Tetanus, diphtheria, and acellular pertussis (Tdap, Td) vaccine. You may need a Td booster every 10 years.  Varicella vaccine. You may need this if you have not been vaccinated.  Zoster vaccine. You may need this after age 70.  Measles, mumps, and rubella (MMR) vaccine. You may need at least one dose of MMR if you were born in 1957 or later. You may also need a second dose.  Pneumococcal 13-valent conjugate (PCV13) vaccine. One dose is recommended after age 100.  Pneumococcal polysaccharide (PPSV23) vaccine. One dose is recommended after age 58.  Meningococcal vaccine. You may need this if you have certain conditions.  Hepatitis A vaccine. You may need this if you have certain conditions or if you travel or work in places where you may be exposed to hepatitis  A.  Hepatitis B vaccine. You may need this if you have certain conditions or if you travel or work in places where you may be exposed to hepatitis B.  Haemophilus influenzae type b (Hib) vaccine. You may need this if you have certain conditions.  Talk to your health care provider about which screenings and vaccines you need and how often you need them. This information is not intended to replace advice given to you by your health care provider. Make sure you discuss any questions you have with your health care provider. Document Released: 11/23/2015 Document Revised: 07/16/2016 Document Reviewed: 08/28/2015 Elsevier Interactive Patient Education  2017 [employer].

## 2017-10-13 NOTE — Progress Notes (Signed)
Subjective:  I acted as a Education administrator for [employer]. Stephanie Barnes, [place]  Patient ID: Stephanie Barnes, female    DOB: 1951-04-14, 66 y.o.   MRN: [id number]  Chief Complaint  Patient presents with  . Annual Exam    HPI  Patient is in today for annual exam and overall is doing well. Her persistent daily problem is her chronic right shoulder/arm symptoms from her CRPS. She continues to follow weekly with Dr Nelva Bush and gets regional blocks each week and then every other week she gets a manipulation every other week. She is down to pain med only prior to her manipulations every other week. She also does exercises/PT at home herself and that helps. She is doing ok with ADLs all things considered. Is eating well. No recent febrile illness or hospitalizations. Denies CP/palp/SOB/HA/congestion/fevers/GI or GU c/o. Taking meds as prescribed  Patient Care Team: Mosie Lukes, MD as PCP - General (Family Medicine) Juanita Craver, MD as Consulting Physician (Gastroenterology) Percival Spanish, PT as Physical Therapist (Physical Therapy) Susa Day, MD as Consulting Physician (Orthopedic Surgery)   Past Medical History:  Diagnosis Date  . Abnormal EKG 06/13/2013  . Anemia 01/13/2016  . Anxiety   . Arthritis   . Complex regional pain syndrome 01/01/2016  . Depression   . 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 Medicare annual wellness visit, subsequent 10/02/2015  . Multiple allergies 03/31/2016  . Rotator cuff tear   . 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 Welcome to Medicare preventive visit 10/02/2015    Past Surgical History:  Procedure Laterality Date  . DENTAL SURGERY     Root cleaning   . REFRACTIVE SURGERY Bilateral 2000  . ROTATOR CUFF REPAIR Right 2017  . TONSILLECTOMY      Family History  Problem Relation Age of Onset  . Heart disease Mother   . Arthritis Mother   . Heart disease Father   . COPD Father   . Mental  illness Brother        bipolar  . Hypertension Brother   . Suicidality Brother   . Alcohol abuse Brother   . Heart disease Maternal Aunt   . Heart disease Maternal Uncle   . Heart disease Paternal Aunt   . COPD Paternal 1   . Heart disease Paternal Uncle   . Heart disease Maternal Grandmother   . Heart disease Maternal Grandfather   . Heart disease Paternal Grandmother   . Heart disease Paternal Grandfather     Social History   Socioeconomic History  . Marital status: Single    Spouse name: Not on file  . Number of children: Not on file  . Years of education: Not on file  . Highest education level: Not on file  Social Needs  . Financial resource strain: Not on file  . Food insecurity - worry: Not on file  . Food insecurity - inability: Not on file  . Transportation needs - medical: Not on file  . Transportation needs - non-medical: Not on file  Occupational History  . Not on file  Tobacco Use  . Smoking status: Never Smoker  . Smokeless tobacco: Never Used  Substance and Sexual Activity  . Alcohol use: Yes    Alcohol/week: 0.6 oz    Types: 1 Glasses of wine per week    Comment: per month  . Drug use: No  . Sexual activity: Not Currently  Comment: lives alone, retired from [employer], no dietary restrictions  Other Topics Concern  . Not on file  Social History Narrative  . Not on file    Outpatient Medications Prior to Visit  Medication Sig Dispense Refill  . Acetaminophen (TYLENOL ARTHRITIS PAIN PO) Take 650 mg by mouth at bedtime.    . Ascorbic Acid (VITAMIN C) 1000 MG tablet Take 1,000 mg by mouth daily.    Marland Kitchen buPROPion (WELLBUTRIN) 75 MG tablet Take 1/2 by mouth daily 30 tablet 5  . Calcium Carbonate-Vit D-Min (CALCIUM 1200 PO) Take 1 tablet by mouth 2 (two) times daily.    . cetirizine (ZYRTEC) 10 MG tablet Take 10 mg by mouth daily as needed.     . chlorhexidine (PERIDEX) 0.12 % solution Use as directed 5 mLs in the mouth or throat at bedtime.      . clonazePAM (KLONOPIN) 1 MG tablet Take 1 tablet (1 mg total) by mouth 3 (three) times daily as needed. for anxiety 90 tablet 1  . Cyanocobalamin (VITAMIN B 12 PO) Take 1,000 mg by mouth daily.     Marland Kitchen ECHINACEA PO Take 1,200 mg by mouth 2 (two) times daily.    Marland Kitchen HYDROmorphone (DILAUDID) 2 MG tablet Take 2 mg by mouth every evening.     . methocarbamol (ROBAXIN) 500 MG tablet Take 500 mg by mouth every morning. Takes one in morning and one at night    . mometasone (ELOCON) 0.1 % ointment Apply topically daily. As needed 15 g 0  . Omega-3 Fatty Acids (SUPER OMEGA-3 PO) Take 1,200 mg by mouth 2 (two) times daily.    . pantoprazole (PROTONIX) 40 MG tablet Take 1 tablet (40 mg total) by mouth daily. 90 tablet 1  . Probiotic Product (PROBIOTIC DAILY PO) Take 1 tablet by mouth daily. NOW 10    . TURMERIC PO Take 894 mg by mouth. GAIA brand-Take 1 in the morning and 1 at night.    . gabapentin (NEURONTIN) 300 MG capsule 1 tab po bid and 2 tabs po qhs (Patient taking differently: 1 tab po midday and 2 tabs po qhs) 120 capsule 3  . PENNSAID 2 % SOLN as needed.      No facility-administered medications prior to visit.     Allergies  Allergen Reactions  . Aspirin Anaphylaxis and Swelling    Swelling of lips and fingers   . Celebrex [Celecoxib] Anaphylaxis, Hives and Shortness Of Breath  . Doxycycline Other (See Comments)    Scared throat   . Prednisone Shortness Of Breath and Other (See Comments)    Chest tightness   . Celexa [Citalopram] Other (See Comments)    Shakiness   . Fosamax [Alendronate Sodium] Hives  . Clindamycin/Lincomycin Nausea And Vomiting  . Other Hives and Nausea Only    Arthritec    Review of Systems  Constitutional: Negative for fever and malaise/fatigue.  HENT: Negative for congestion.   Eyes: Negative for blurred vision.  Respiratory: Negative for shortness of breath.   Cardiovascular: Negative for chest pain, palpitations and leg swelling.  Gastrointestinal:  Negative for abdominal pain, blood in stool and nausea.  Genitourinary: Negative for dysuria and frequency.  Musculoskeletal: Positive for joint pain, myalgias and neck pain. Negative for falls.  Skin: Negative for rash.  Neurological: Negative for dizziness, loss of consciousness and headaches.  Endo/Heme/Allergies: Negative for environmental allergies.  Psychiatric/Behavioral: Negative for depression. The patient is not nervous/anxious.        Objective:  Physical Exam  Constitutional: She is oriented to person, place, and time. She appears well-developed and well-nourished. No distress.  HENT:  Head: Normocephalic and atraumatic.  Eyes: Conjunctivae are normal.  Neck: Neck supple. No thyromegaly present.  Cardiovascular: Normal rate, regular rhythm and normal heart sounds.  No murmur heard. Pulmonary/Chest: Effort normal and breath sounds normal. No respiratory distress.  Abdominal: Soft. Bowel sounds are normal. She exhibits no distension and no mass. There is no tenderness.  Musculoskeletal: She exhibits no edema.  Lymphadenopathy:    She has no cervical adenopathy.  Neurological: She is alert and oriented to person, place, and time.  Skin: Skin is warm and dry.  Psychiatric: She has a normal mood and affect. Her behavior is normal.    BP 124/66 (BP Location: Left Arm, Patient Position: Sitting, Cuff Size: Normal)   Pulse 77   Temp 98 F (36.7 C) (Oral)   Resp 16   Ht 4' 11.84" (1.52 m)   Wt 136 lb (61.7 kg)   SpO2 99%   BMI 26.70 kg/m  Wt Readings from Last 3 Encounters:  10/13/17 136 lb (61.7 kg)  05/14/17 136 lb 3.2 oz (61.8 kg)  03/30/17 135 lb (61.2 kg)   BP Readings from Last 3 Encounters:  10/13/17 124/66  06/12/17 131/78  05/14/17 100/68     Immunization History  Administered Date(s) Administered  . Influenza, Seasonal, Injecte, Preservative Fre 12/02/2012  . Influenza,inj,Quad PF,6+ Mos 07/31/2014, 10/02/2015, 09/19/2016  . Influenza-Unspecified  08/12/2017  . Pneumococcal Conjugate-13 10/02/2015  . Pneumococcal Polysaccharide-23 05/14/2017  . Zoster 12/02/2012    Health Maintenance  Topic Date Due  . HEMOGLOBIN A1C  10/31/50  . FOOT EXAM  07/17/1960  . OPHTHALMOLOGY EXAM  07/17/1960  . URINE MICROALBUMIN  07/17/1960  . COLON CANCER SCREENING ANNUAL FOBT  08/01/2015  . MAMMOGRAM  10/31/2018  . TETANUS/TDAP  06/14/2023  . INFLUENZA VACCINE  Completed  . DEXA SCAN  Completed  . Hepatitis C Screening  Completed  . PNA vac Low Risk Adult  Completed    Lab Results  Component Value Date   WBC 5.8 10/13/2017   HGB 12.1 10/13/2017   HCT 36.1 10/13/2017   PLT 216.0 10/13/2017   GLUCOSE 90 10/13/2017   CHOL 228 (H) 10/13/2017   TRIG 150.0 (H) 10/13/2017   HDL 74.20 10/13/2017   LDLCALC 124 (H) 10/13/2017   ALT 12 10/13/2017   AST 10 10/13/2017   NA 140 10/13/2017   K 4.2 10/13/2017   CL 104 10/13/2017   CREATININE 0.79 10/13/2017   BUN 22 10/13/2017   CO2 28 10/13/2017   TSH 1.45 10/13/2017    Lab Results  Component Value Date   TSH 1.45 10/13/2017   Lab Results  Component Value Date   WBC 5.8 10/13/2017   HGB 12.1 10/13/2017   HCT 36.1 10/13/2017   MCV 91.0 10/13/2017   PLT 216.0 10/13/2017   Lab Results  Component Value Date   NA 140 10/13/2017   K 4.2 10/13/2017   CO2 28 10/13/2017   GLUCOSE 90 10/13/2017   BUN 22 10/13/2017   CREATININE 0.79 10/13/2017   BILITOT 0.3 10/13/2017   ALKPHOS 48 10/13/2017   AST 10 10/13/2017   ALT 12 10/13/2017   PROT 7.1 10/13/2017   ALBUMIN 4.4 10/13/2017   CALCIUM 9.1 10/13/2017   ANIONGAP 8 12/26/2015   GFR 77.10 10/13/2017   Lab Results  Component Value Date   CHOL 228 (H) 10/13/2017   Lab  Results  Component Value Date   HDL 74.20 10/13/2017   Lab Results  Component Value Date   LDLCALC 124 (H) 10/13/2017   Lab Results  Component Value Date   TRIG 150.0 (H) 10/13/2017   Lab Results  Component Value Date   CHOLHDL 3 10/13/2017   No results  found for: HGBA1C       Assessment & Plan:   Problem List Items Addressed This Visit    Complex regional pain syndrome type I (Location of Primary Source of Pain) (upper extremity) (Right) (Chronic)    Continues to struggle and gets a weekly nerve block and is trying to stay off of most pain meds only takes a pain med/Dilaudid and Robaxin on the morning she gets the nerve block and then goes to physical therapy manipulations and she gets in a lot of pain after that if she does not take many meds. Takes Gabapentin still. Now takes 2 at night only.       Relevant Medications   gabapentin (NEURONTIN) 300 MG capsule   GERD (gastroesophageal reflux disease)    No complaints today. Has been on a softer higher starch diet since halloween due to oral surgery but is improving.       Hyperlipidemia    Encouraged heart healthy diet, increase exercise, avoid trans fats, consider a krill oil cap daily      Relevant Orders   Lipid panel (Completed)   Preventative health care    Patient encouraged to maintain heart healthy diet, regular exercise, adequate sleep. Consider daily probiotics. Take medications as prescribed. Labs reviewed      Relevant Orders   Comprehensive metabolic panel (Completed)   TSH (Completed)   Anemia    Increase leafy greens, consider increased lean red meat and using cast iron cookware. Continue to monitor, report any concerns      Relevant Orders   CBC (Completed)    Other Visit Diagnoses    Decreased visual acuity    -  Primary   Relevant Orders   Ambulatory referral to Ophthalmology   Sun-damaged skin       Relevant Orders   Ambulatory referral to Dermatology      I have discontinued [place] PENNSAID. I have also changed her gabapentin. Additionally, I am having her maintain her Probiotic Product (PROBIOTIC DAILY PO), Calcium Carbonate-Vit D-Min (CALCIUM 1200 PO), cetirizine, mometasone, HYDROmorphone, methocarbamol, chlorhexidine,  Cyanocobalamin (VITAMIN B 12 PO), vitamin C, TURMERIC PO, Omega-3 Fatty Acids (SUPER OMEGA-3 PO), Acetaminophen (TYLENOL ARTHRITIS PAIN PO), ECHINACEA PO, buPROPion, pantoprazole, and clonazePAM.  Meds ordered this encounter  Medications  . gabapentin (NEURONTIN) 300 MG capsule    Sig: Take 2 capsules (600 mg total) by mouth at bedtime.    Dispense:  60 capsule    Refill:  5   CMA served as scribe during this visit. History, Physical and Plan performed by medical provider. Documentation and orders reviewed and attested to.   Penni Homans 10/15/2017 12:45 PM

## 2017-10-13 NOTE — Assessment & Plan Note (Signed)
Increase leafy greens, consider increased lean red meat and using cast iron cookware. Continue to monitor, report any concerns 

## 2017-10-13 NOTE — Assessment & Plan Note (Signed)
No complaints today. Has been on a softer higher starch diet since halloween due to oral surgery but is improving.

## 2017-10-13 NOTE — Assessment & Plan Note (Signed)
Patient encouraged to maintain heart healthy diet, regular exercise, adequate sleep. Consider daily probiotics. Take medications as prescribed. Labs reviewed 

## 2017-10-14 LAB — CBC
HEMATOCRIT: 36.1 % (ref 36.0–46.0)
Hemoglobin: 12.1 g/dL (ref 12.0–15.0)
MCHC: 33.6 g/dL (ref 30.0–36.0)
MCV: 91 fl (ref 78.0–100.0)
Platelets: 216 10*3/uL (ref 150.0–400.0)
RBC: 3.96 Mil/uL (ref 3.87–5.11)
RDW: 13.2 % (ref 11.5–15.5)
WBC: 5.8 10*3/uL (ref 4.0–10.5)

## 2017-10-14 LAB — LIPID PANEL
CHOL/HDL RATIO: 3
CHOLESTEROL: 228 mg/dL — AB (ref 0–200)
HDL: 74.2 mg/dL (ref >39.00)
LDL CALC: 124 mg/dL — AB (ref 0–99)
NonHDL: 153.84
Triglycerides: 150 mg/dL — ABNORMAL HIGH (ref 0.0–149.0)
VLDL: 30 mg/dL (ref 0.0–40.0)

## 2017-10-14 LAB — COMPREHENSIVE METABOLIC PANEL
ALBUMIN: 4.4 g/dL (ref 3.5–5.2)
ALT: 12 U/L (ref 0–35)
AST: 10 U/L (ref 0–37)
Alkaline Phosphatase: 48 U/L (ref 39–117)
BUN: 22 mg/dL (ref 6–23)
CHLORIDE: 104 meq/L (ref 96–112)
CO2: 28 meq/L (ref 19–32)
CREATININE: 0.79 mg/dL (ref 0.40–1.20)
Calcium: 9.1 mg/dL (ref 8.4–10.5)
GFR: 77.1 mL/min (ref >60.00)
Glucose, Bld: 90 mg/dL (ref 70–99)
POTASSIUM: 4.2 meq/L (ref 3.5–5.1)
SODIUM: 140 meq/L (ref 135–145)
Total Bilirubin: 0.3 mg/dL (ref 0.2–1.2)
Total Protein: 7.1 g/dL (ref 6.0–8.3)

## 2017-10-14 LAB — TSH: TSH: 1.45 u[IU]/mL (ref 0.35–4.50)

## 2017-10-15 DIAGNOSIS — M79601 Pain in right arm: Secondary | ICD-10-CM | POA: Diagnosis not present

## 2017-10-15 DIAGNOSIS — M7989 Other specified soft tissue disorders: Secondary | ICD-10-CM | POA: Diagnosis not present

## 2017-10-22 DIAGNOSIS — M79601 Pain in right arm: Secondary | ICD-10-CM | POA: Diagnosis not present

## 2017-10-22 DIAGNOSIS — M7989 Other specified soft tissue disorders: Secondary | ICD-10-CM | POA: Diagnosis not present

## 2017-10-29 DIAGNOSIS — M7989 Other specified soft tissue disorders: Secondary | ICD-10-CM | POA: Diagnosis not present

## 2017-10-29 DIAGNOSIS — M79601 Pain in right arm: Secondary | ICD-10-CM | POA: Diagnosis not present

## 2017-11-05 DIAGNOSIS — M7989 Other specified soft tissue disorders: Secondary | ICD-10-CM | POA: Diagnosis not present

## 2017-11-05 DIAGNOSIS — M79601 Pain in right arm: Secondary | ICD-10-CM | POA: Diagnosis not present

## 2017-11-16 DIAGNOSIS — Z23 Encounter for immunization: Secondary | ICD-10-CM | POA: Diagnosis not present

## 2017-11-16 DIAGNOSIS — D225 Melanocytic nevi of trunk: Secondary | ICD-10-CM | POA: Diagnosis not present

## 2017-11-16 DIAGNOSIS — L821 Other seborrheic keratosis: Secondary | ICD-10-CM | POA: Diagnosis not present

## 2017-11-16 DIAGNOSIS — L814 Other melanin hyperpigmentation: Secondary | ICD-10-CM | POA: Diagnosis not present

## 2017-11-16 DIAGNOSIS — D1801 Hemangioma of skin and subcutaneous tissue: Secondary | ICD-10-CM | POA: Diagnosis not present

## 2017-11-17 DIAGNOSIS — H524 Presbyopia: Secondary | ICD-10-CM | POA: Diagnosis not present

## 2017-11-17 DIAGNOSIS — H5203 Hypermetropia, bilateral: Secondary | ICD-10-CM | POA: Diagnosis not present

## 2017-11-17 DIAGNOSIS — H52223 Regular astigmatism, bilateral: Secondary | ICD-10-CM | POA: Diagnosis not present

## 2017-11-19 DIAGNOSIS — M89021 Algoneurodystrophy, right upper arm: Secondary | ICD-10-CM | POA: Diagnosis not present

## 2017-11-19 DIAGNOSIS — M79601 Pain in right arm: Secondary | ICD-10-CM | POA: Diagnosis not present

## 2017-12-03 DIAGNOSIS — M79601 Pain in right arm: Secondary | ICD-10-CM | POA: Diagnosis not present

## 2017-12-03 DIAGNOSIS — M89021 Algoneurodystrophy, right upper arm: Secondary | ICD-10-CM | POA: Diagnosis not present

## 2017-12-17 DIAGNOSIS — M89021 Algoneurodystrophy, right upper arm: Secondary | ICD-10-CM | POA: Diagnosis not present

## 2017-12-31 DIAGNOSIS — M25511 Pain in right shoulder: Secondary | ICD-10-CM | POA: Diagnosis not present

## 2017-12-31 DIAGNOSIS — M89021 Algoneurodystrophy, right upper arm: Secondary | ICD-10-CM | POA: Diagnosis not present

## 2018-01-05 ENCOUNTER — Encounter: Payer: Self-pay | Admitting: Family Medicine

## 2018-01-07 MED ORDER — CLONAZEPAM 1 MG PO TABS: 1.0000 mg | ORAL_TABLET | Freq: Three times a day (TID) | ORAL | 0 refills | Status: DC | PRN

## 2018-01-07 NOTE — Telephone Encounter (Signed)
Requesting: clonazepam Contract: 01/13/17 UDS:01/13/17 low risk Last Visit:10/13/17 Next Visit:04/13/18 Last Refill:10/08/17 1 refill  Please Advise

## 2018-01-14 DIAGNOSIS — M89021 Algoneurodystrophy, right upper arm: Secondary | ICD-10-CM | POA: Diagnosis not present

## 2018-01-28 DIAGNOSIS — M79601 Pain in right arm: Secondary | ICD-10-CM | POA: Diagnosis not present

## 2018-01-28 DIAGNOSIS — M89021 Algoneurodystrophy, right upper arm: Secondary | ICD-10-CM | POA: Diagnosis not present

## 2018-02-11 DIAGNOSIS — M89021 Algoneurodystrophy, right upper arm: Secondary | ICD-10-CM | POA: Diagnosis not present

## 2018-02-18 DIAGNOSIS — M89021 Algoneurodystrophy, right upper arm: Secondary | ICD-10-CM | POA: Diagnosis not present

## 2018-03-04 DIAGNOSIS — M89021 Algoneurodystrophy, right upper arm: Secondary | ICD-10-CM | POA: Diagnosis not present

## 2018-03-18 DIAGNOSIS — M89021 Algoneurodystrophy, right upper arm: Secondary | ICD-10-CM | POA: Diagnosis not present

## 2018-03-26 NOTE — Progress Notes (Addendum)
Subjective:   Stephanie Barnes is a 67 y.o. female who presents for Medicare Annual (Subsequent) preventive examination.  Works part time driving workers comp patients.   Pt struggles with CRPS. States she is still getting some injections and has came off of most of her pain meds. She is very diligent about doing home exercises to improve shoulder strength and mobility.She is excited that she is now able to play golf again.  Review of Systems: No ROS.  Medicare Wellness Visit. Additional risk factors are reflected in the social history.  Cardiac Risk Factors include: advanced age (>58men, >42 women);dyslipidemia Sleep patterns: Sleeps well 7-8 hrs per night. Cat wakes her once each night to eat and go out, but states she can go back to sleep easily. Home Safety/Smoke Alarms: Feels safe in home. Smoke alarms in place.  Living environment; residence and Firearm Safety: Lives alone. No stairs  walk in shower with bench.  Female:   Pap-utd       Mammo-ordered       Dexa scan- utd       CCS-last 03/2014. Recall 5-10 yrs Objective:     Vitals: BP 120/68 (BP Location: Left Arm, Patient Position: Sitting, Cuff Size: Normal)   Pulse 68   Ht 5' (1.524 m)   Wt 137 lb (62.1 kg)   SpO2 98%   BMI 26.76 kg/m   Body mass index is 26.76 kg/m.  Advanced Directives 04/01/2018 03/30/2017 09/04/2016 12/26/2015 11/20/2015 10/02/2015 08/15/2015  Does Patient Have a Medical Advance Directive? Yes Yes Yes No Yes Yes Yes  Type of Paramedic of [place];Living will [place];Living will Living will - [place];Living will [place];Living will [place];Living will  Does patient want to make changes to medical advance directive? No - Patient declined No - Patient declined No - Patient declined - No - Patient declined - No - Patient declined  Copy of [place] in Chart? Yes No - copy  requested No - copy requested - - Yes Yes  Would patient like information on creating a medical advance directive? - - - No - patient declined information - - -    Tobacco Social History   Tobacco Use  Smoking Status Never Smoker  Smokeless Tobacco Never Used     Counseling given: Not Answered   Clinical Intake: Pain : No/denies pain     Past Medical History:  Diagnosis Date  . Abnormal EKG 06/13/2013  . Anemia 01/13/2016  . Anxiety   . Arthritis   . Complex regional pain syndrome 01/01/2016  . Depression   . 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 Medicare annual wellness visit, subsequent 10/02/2015  . Multiple allergies 03/31/2016  . Rotator cuff tear   . 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 Welcome to Medicare preventive visit 10/02/2015   Past Surgical History:  Procedure Laterality Date  . DENTAL SURGERY     Root cleaning   . MOUTH SURGERY  08/10/2017   front lower  . REFRACTIVE SURGERY Bilateral 2000  . ROTATOR CUFF REPAIR Right 2017  . TONSILLECTOMY     Family History  Problem Relation Age of Onset  . Heart disease Mother   . Arthritis Mother   . Heart disease Father   . COPD Father   . Mental illness Brother  bipolar  . Hypertension Brother   . Suicidality Brother   . Alcohol abuse Brother   . Heart disease Maternal Aunt   . Heart disease Maternal Uncle   . Heart disease Paternal Aunt   . COPD Paternal 49   . Heart disease Paternal Uncle   . Heart disease Maternal Grandmother   . Heart disease Maternal Grandfather   . Heart disease Paternal Grandmother   . Heart disease Paternal Grandfather    Social History   Socioeconomic History  . Marital status: Single    Spouse name: Not on file  . Number of children: Not on file  . Years of education: Not on file  . Highest education level: Not on file  Occupational History  . Not on file  Social Needs  . Financial  resource strain: Not on file  . Food insecurity:    Worry: Not on file    Inability: Not on file  . Transportation needs:    Medical: Not on file    Non-medical: Not on file  Tobacco Use  . Smoking status: Never Smoker  . Smokeless tobacco: Never Used  Substance and Sexual Activity  . Alcohol use: Yes    Alcohol/week: 0.6 oz    Types: 1 Glasses of wine per week    Comment: per week  . Drug use: No  . Sexual activity: Not Currently    Comment: lives alone, retired from [employer], no dietary restrictions  Lifestyle  . Physical activity:    Days per week: Not on file    Minutes per session: Not on file  . Stress: Not on file  Relationships  . Social connections:    Talks on phone: Not on file    Gets together: Not on file    Attends religious service: Not on file    Active member of club or organization: Not on file    Attends meetings of clubs or organizations: Not on file    Relationship status: Not on file  Other Topics Concern  . Not on file  Social History Narrative  . Not on file    Outpatient Encounter Medications as of 04/01/2018  Medication Sig  . Ascorbic Acid (VITAMIN C) 1000 MG tablet Take 1,000 mg by mouth daily.  Marland Kitchen buPROPion (WELLBUTRIN) 75 MG tablet Take 1/2 by mouth daily  . Calcium Carbonate-Vit D-Min (CALCIUM 1200 PO) Take 1 tablet by mouth 2 (two) times daily.  . cetirizine (ZYRTEC) 10 MG tablet Take 10 mg by mouth daily as needed.   . Cyanocobalamin (VITAMIN B 12 PO) Take 1,000 mg by mouth daily.   Marland Kitchen ECHINACEA PO Take 1,200 mg by mouth 2 (two) times daily.  Marland Kitchen gabapentin (NEURONTIN) 300 MG capsule Take 2 capsules (600 mg total) by mouth at bedtime. (Patient taking differently: Take 300 mg by mouth at bedtime. )  . HYDROmorphone (DILAUDID) 2 MG tablet Take 2 mg by mouth every evening.   . mometasone (ELOCON) 0.1 % ointment Apply topically daily. As needed  . Omega-3 Fatty Acids (SUPER OMEGA-3 PO) Take 1,200 mg by mouth 2 (two) times daily.  .  Probiotic Product (PROBIOTIC DAILY PO) Take 1 tablet by mouth daily. NOW 10  . TURMERIC PO Take 894 mg by mouth. GAIA brand-Take 1 in the morning and 1 at night.  . Acetaminophen (TYLENOL ARTHRITIS PAIN PO) Take 650 mg by mouth at bedtime.  . chlorhexidine (PERIDEX) 0.12 % solution Use as directed 5 mLs in the mouth or throat  at bedtime.   . clonazePAM (KLONOPIN) 1 MG tablet Take 1 tablet (1 mg total) by mouth 3 (three) times daily as needed. for anxiety (Patient not taking: Reported on 04/01/2018)  . pantoprazole (PROTONIX) 40 MG tablet Take 1 tablet (40 mg total) by mouth daily. (Patient not taking: Reported on 04/01/2018)  . [DISCONTINUED] methocarbamol (ROBAXIN) 500 MG tablet Take 500 mg by mouth every morning. Takes one in morning and one at night   No facility-administered encounter medications on file as of 04/01/2018.     Activities of Daily Living In your present state of health, do you have any difficulty performing the following activities: 04/01/2018  Hearing? N  Vision? N  Comment wears reading glasses. Last vision exam in 10/2017  Difficulty concentrating or making decisions? N  Walking or climbing stairs? N  Dressing or bathing? N  Doing errands, shopping? N  Preparing Food and eating ? N  Using the Toilet? N  In the past six months, have you accidently leaked urine? N  Do you have problems with loss of bowel control? N  Managing your Medications? N  Managing your Finances? N  Housekeeping or managing your Housekeeping? N  Some recent data might be hidden    Patient Care Team: Mosie Lukes, MD as PCP - General (Family Medicine) Juanita Craver, MD as Consulting Physician (Gastroenterology) Percival Spanish, PT as Physical Therapist (Physical Therapy) Susa Day, MD as Consulting Physician (Orthopedic Surgery)    Assessment:   This is a routine wellness examination for [place]. Physical assessment deferred to PCP.  Exercise Activities and Dietary  recommendations Current Exercise Habits: The patient does not participate in regular exercise at present, Exercise limited by: None identified   Diet (meal preparation, eat out, water intake, caffeinated beverages, dairy products, fruits and vegetables): in general, a "healthy" diet  , well balanced  1 cup coffee for breakfast and smoothie Eggs, cereal Chicken once per week Beef once per month  Pork once per month Eats a lot of veggie meat and vegetable.     Goals    . Continue to become more social       Fall Risk Fall Risk  04/01/2018 03/30/2017 09/04/2016 09/13/2015 09/29/2013  Falls in the past year? No No No No No    Depression Screen PHQ 2/9 Scores 04/01/2018 03/30/2017 09/04/2016 09/13/2015  PHQ - 2 Score 0 1 0 0     Cognitive Function Ad8 score reviewed for issues:  Issues making decisions:no  Less interest in hobbies / activities:no  Repeats questions, stories (family complaining):no  Trouble using ordinary gadgets (microwave, computer, phone):no  Forgets the month or year: no  Mismanaging finances: no  Remembering appts:no  Daily problems with thinking and/or memory:no Ad8 score is=0         Immunization History  Administered Date(s) Administered  . Influenza, Seasonal, Injecte, Preservative Fre 12/02/2012  . Influenza,inj,Quad PF,6+ Mos 07/31/2014, 10/02/2015, 09/19/2016  . Influenza-Unspecified 08/12/2017  . Pneumococcal Conjugate-13 10/02/2015  . Pneumococcal Polysaccharide-23 05/14/2017  . Zoster 12/02/2012    Screening Tests Health Maintenance  Topic Date Due  . HEMOGLOBIN A1C  1951/07/22  . FOOT EXAM  07/17/1960  . OPHTHALMOLOGY EXAM  07/17/1960  . URINE MICROALBUMIN  07/17/1960  . INFLUENZA VACCINE  06/10/2018  . MAMMOGRAM  10/31/2018  . TETANUS/TDAP  06/14/2023  . COLONOSCOPY  03/15/2024  . DEXA SCAN  Completed  . Hepatitis C Screening  Completed  . PNA vac Low Risk Adult  Completed  Plan:   Follow up with Dr.Blyth as  scheduled 04/13/18.  Please schedule your next medicare wellness visit with me in 1 yr.  Continue to eat heart healthy diet (full of fruits, vegetables, whole grains, lean protein, water--limit salt, fat, and sugar intake) and increase physical activity as tolerated.  Continue doing brain stimulating activities (puzzles, reading, adult coloring books, staying active) to keep memory sharp.     I have personally reviewed and noted the following in the patient's chart:   . Medical and social history . Use of alcohol, tobacco or illicit drugs  . Current medications and supplements . Functional ability and status . Nutritional status . Physical activity . Advanced directives . List of other physicians . Hospitalizations, surgeries, and ER visits in previous 12 months . Vitals . Screenings to include cognitive, depression, and falls . Referrals and appointments  In addition, I have reviewed and discussed with patient certain preventive protocols, quality metrics, and best practice recommendations. A written personalized care plan for preventive services as well as general preventive health recommendations were provided to patient.     Shela Nevin, [place]  04/01/2018   Medical screening examination/treatment was performed by qualified clinical staff member and as supervising physician I was immediately available for consultation/collaboration. I have reviewed documentation and agree with assessment and plan.  Penni Homans, MD

## 2018-03-31 ENCOUNTER — Ambulatory Visit: Payer: Medicare HMO | Admitting: *Deleted

## 2018-04-01 ENCOUNTER — Encounter: Payer: Self-pay | Admitting: *Deleted

## 2018-04-01 ENCOUNTER — Ambulatory Visit (INDEPENDENT_AMBULATORY_CARE_PROVIDER_SITE_OTHER): Payer: Medicare HMO | Admitting: *Deleted

## 2018-04-01 VITALS — BP 120/68 | HR 68 | Ht 60.0 in | Wt 137.0 lb

## 2018-04-01 DIAGNOSIS — Z0000 Encounter for general adult medical examination without abnormal findings: Secondary | ICD-10-CM

## 2018-04-01 DIAGNOSIS — Z1239 Encounter for other screening for malignant neoplasm of breast: Secondary | ICD-10-CM

## 2018-04-01 DIAGNOSIS — M89021 Algoneurodystrophy, right upper arm: Secondary | ICD-10-CM | POA: Diagnosis not present

## 2018-04-01 DIAGNOSIS — Z1231 Encounter for screening mammogram for malignant neoplasm of breast: Secondary | ICD-10-CM | POA: Diagnosis not present

## 2018-04-01 NOTE — Patient Instructions (Signed)
Follow up with Dr.Blyth as scheduled 04/13/18.  Please schedule your next medicare wellness visit with me in 1 yr.  Continue to eat heart healthy diet (full of fruits, vegetables, whole grains, lean protein, water--limit salt, fat, and sugar intake) and increase physical activity as tolerated.  Continue doing brain stimulating activities (puzzles, reading, adult coloring books, staying active) to keep memory sharp.    Stephanie Barnes , Thank you for taking time to come for your Medicare Wellness Visit. I appreciate your ongoing commitment to your health goals. Please review the following plan we discussed and let me know if I can assist you in the future.   These are the goals we discussed: Goals    . Continue to become more social       This is a list of the screening recommended for you and due dates:  Health Maintenance  Topic Date Due  . Hemoglobin A1C  11/21/66  . Complete foot exam   07/18/1967  . Eye exam for diabetics  07/18/1967  . Urine Protein Check  07/18/1967  . Flu Shot  06/10/2018  . Mammogram  10/31/2018  . Tetanus Vaccine  06/14/2023  . Colon Cancer Screening  03/15/2024  . DEXA scan (bone density measurement)  Completed  .  Hepatitis C: One time screening is recommended by Center for Disease Control  (CDC) for  adults born from [birth year] through 1966.   Completed  . Pneumonia vaccines  Completed    Health Maintenance for Postmenopausal Women Menopause is a normal process in which your reproductive ability comes to an end. This process happens gradually over a span of months to years, usually between the ages of 62 and 9. Menopause is complete when you have missed 12 consecutive menstrual periods. It is important to talk with your health care provider about some of the most common conditions that affect postmenopausal women, such as heart disease, cancer, and bone loss (osteoporosis). Adopting a healthy lifestyle and getting preventive care can help to promote your  health and wellness. Those actions can also lower your chances of developing some of these common conditions. What should I know about menopause? During menopause, you may experience a number of symptoms, such as:  Moderate-to-severe hot flashes.  Night sweats.  Decrease in sex drive.  Mood swings.  Headaches.  Tiredness.  Irritability.  Memory problems.  Insomnia.  Choosing to treat or not to treat menopausal changes is an individual decision that you make with your health care provider. What should I know about hormone replacement therapy and supplements? Hormone therapy products are effective for treating symptoms that are associated with menopause, such as hot flashes and night sweats. Hormone replacement carries certain risks, especially as you become older. If you are thinking about using estrogen or estrogen with progestin treatments, discuss the benefits and risks with your health care provider. What should I know about heart disease and stroke? Heart disease, heart attack, and stroke become more likely as you age. This may be due, in part, to the hormonal changes that your body experiences during menopause. These can affect how your body processes dietary fats, triglycerides, and cholesterol. Heart attack and stroke are both medical emergencies. There are many things that you can do to help prevent heart disease and stroke:  Have your blood pressure checked at least every 1-2 years. High blood pressure causes heart disease and increases the risk of stroke.  If you are 13-68 years old, ask your health care provider if you should  take aspirin to prevent a heart attack or a stroke.  Do not use any tobacco products, including cigarettes, chewing tobacco, or electronic cigarettes. If you need help quitting, ask your health care provider.  It is important to eat a healthy diet and maintain a healthy weight. ? Be sure to include plenty of vegetables, fruits, low-fat dairy  products, and lean protein. ? Avoid eating foods that are high in solid fats, added sugars, or salt (sodium).  Get regular exercise. This is one of the most important things that you can do for your health. ? Try to exercise for at least 150 minutes each week. The type of exercise that you do should increase your heart rate and make you sweat. This is known as moderate-intensity exercise. ? Try to do strengthening exercises at least twice each week. Do these in addition to the moderate-intensity exercise.  Know your numbers.Ask your health care provider to check your cholesterol and your blood glucose. Continue to have your blood tested as directed by your health care provider.  What should I know about cancer screening? There are several types of cancer. Take the following steps to reduce your risk and to catch any cancer development as early as possible. Breast Cancer  Practice breast self-awareness. ? This means understanding how your breasts normally appear and feel. ? It also means doing regular breast self-exams. Let your health care provider know about any changes, no matter how small.  If you are 68 or older, have a clinician do a breast exam (clinical breast exam or CBE) every year. Depending on your age, family history, and medical history, it may be recommended that you also have a yearly breast X-ray (mammogram).  If you have a family history of breast cancer, talk with your health care provider about genetic screening.  If you are at high risk for breast cancer, talk with your health care provider about having an MRI and a mammogram every year.  Breast cancer (BRCA) gene test is recommended for women who have family members with BRCA-related cancers. Results of the assessment will determine the need for genetic counseling and BRCA1 and for BRCA2 testing. BRCA-related cancers include these types: ? Breast. This occurs in males or females. ? Ovarian. ? Tubal. This may also be  called fallopian tube cancer. ? Cancer of the abdominal or pelvic lining (peritoneal cancer). ? Prostate. ? Pancreatic.  Cervical, Uterine, and Ovarian Cancer Your health care provider may recommend that you be screened regularly for cancer of the pelvic organs. These include your ovaries, uterus, and vagina. This screening involves a pelvic exam, which includes checking for microscopic changes to the surface of your cervix (Pap test).  For women ages 21-65, health care providers may recommend a pelvic exam and a Pap test every three years. For women ages 53-65, they may recommend the Pap test and pelvic exam, combined with testing for human papilloma virus (HPV), every five years. Some types of HPV increase your risk of cervical cancer. Testing for HPV may also be done on women of any age who have unclear Pap test results.  Other health care providers may not recommend any screening for nonpregnant women who are considered low risk for pelvic cancer and have no symptoms. Ask your health care provider if a screening pelvic exam is right for you.  If you have had past treatment for cervical cancer or a condition that could lead to cancer, you need Pap tests and screening for cancer for at least  20 years after your treatment. If Pap tests have been discontinued for you, your risk factors (such as having a new sexual partner) need to be reassessed to determine if you should start having screenings again. Some women have medical problems that increase the chance of getting cervical cancer. In these cases, your health care provider may recommend that you have screening and Pap tests more often.  If you have a family history of uterine cancer or ovarian cancer, talk with your health care provider about genetic screening.  If you have vaginal bleeding after reaching menopause, tell your health care provider.  There are currently no reliable tests available to screen for ovarian cancer.  Lung  Cancer Lung cancer screening is recommended for adults 49-72 years old who are at high risk for lung cancer because of a history of smoking. A yearly low-dose CT scan of the lungs is recommended if you:  Currently smoke.  Have a history of at least 30 pack-years of smoking and you currently smoke or have quit within the past 15 years. A pack-year is smoking an average of one pack of cigarettes per day for one year.  Yearly screening should:  Continue until it has been 15 years since you quit.  Stop if you develop a health problem that would prevent you from having lung cancer treatment.  Colorectal Cancer  This type of cancer can be detected and can often be prevented.  Routine colorectal cancer screening usually begins at age 32 and continues through age 62.  If you have risk factors for colon cancer, your health care provider may recommend that you be screened at an earlier age.  If you have a family history of colorectal cancer, talk with your health care provider about genetic screening.  Your health care provider may also recommend using home test kits to check for hidden blood in your stool.  A small camera at the end of a tube can be used to examine your colon directly (sigmoidoscopy or colonoscopy). This is done to check for the earliest forms of colorectal cancer.  Direct examination of the colon should be repeated every 5-10 years until age 46. However, if early forms of precancerous polyps or small growths are found or if you have a family history or genetic risk for colorectal cancer, you may need to be screened more often.  Skin Cancer  Check your skin from head to toe regularly.  Monitor any moles. Be sure to tell your health care provider: ? About any new moles or changes in moles, especially if there is a change in a mole's shape or color. ? If you have a mole that is larger than the size of a pencil eraser.  If any of your family members has a history of skin  cancer, especially at a young age, talk with your health care provider about genetic screening.  Always use sunscreen. Apply sunscreen liberally and repeatedly throughout the day.  Whenever you are outside, protect yourself by wearing long sleeves, pants, a wide-brimmed hat, and sunglasses.  What should I know about osteoporosis? Osteoporosis is a condition in which bone destruction happens more quickly than new bone creation. After menopause, you may be at an increased risk for osteoporosis. To help prevent osteoporosis or the bone fractures that can happen because of osteoporosis, the following is recommended:  If you are 60-51 years old, get at least 1,000 mg of calcium and at least 600 mg of vitamin D per day.  If you  are older than age 21 but younger than age 5, get at least 1,200 mg of calcium and at least 600 mg of vitamin D per day.  If you are older than age 4, get at least 1,200 mg of calcium and at least 800 mg of vitamin D per day.  Smoking and excessive alcohol intake increase the risk of osteoporosis. Eat foods that are rich in calcium and vitamin D, and do weight-bearing exercises several times each week as directed by your health care provider. What should I know about how menopause affects my mental health? Depression may occur at any age, but it is more common as you become older. Common symptoms of depression include:  Low or sad mood.  Changes in sleep patterns.  Changes in appetite or eating patterns.  Feeling an overall lack of motivation or enjoyment of activities that you previously enjoyed.  Frequent crying spells.  Talk with your health care provider if you think that you are experiencing depression. What should I know about immunizations? It is important that you get and maintain your immunizations. These include:  Tetanus, diphtheria, and pertussis (Tdap) booster vaccine.  Influenza every year before the flu season begins.  Pneumonia  vaccine.  Shingles vaccine.  Your health care provider may also recommend other immunizations. This information is not intended to replace advice given to you by your health care provider. Make sure you discuss any questions you have with your health care provider. Document Released: 12/19/2005 Document Revised: 05/16/2016 Document Reviewed: 07/31/2015 Elsevier Interactive Patient Education  2018 [employer].

## 2018-04-07 ENCOUNTER — Other Ambulatory Visit: Payer: Self-pay | Admitting: Family Medicine

## 2018-04-07 ENCOUNTER — Encounter: Payer: Self-pay | Admitting: Family Medicine

## 2018-04-07 ENCOUNTER — Telehealth: Payer: Self-pay | Admitting: Family Medicine

## 2018-04-07 MED ORDER — CLONAZEPAM 1 MG PO TABS: 1.0000 mg | ORAL_TABLET | Freq: Three times a day (TID) | ORAL | 0 refills | Status: DC | PRN

## 2018-04-07 MED ORDER — BUPROPION HCL 75 MG PO TABS: ORAL_TABLET | ORAL | 5 refills | Status: DC

## 2018-04-07 NOTE — Telephone Encounter (Signed)
Requesting:KLONOPIN 1 MG Contract:01/13/17 UDS:01/13/17 Low risk Last OV: 10/13/17 Next OV:- Last Refill:01/07/18   Please advise

## 2018-04-07 NOTE — Telephone Encounter (Signed)
Copied from [place] [id number]. Topic: Quick Communication - See Telephone Encounter >> Apr 07, 2018  6:41 PM Ivar Drape wrote: CRM for notification. See Telephone encounter for: 04/07/18. Patient was in last week and told the provider she needed prescriptions for the following medications and have them sent to her preferred pharmacy [facility] on [address] in [place].  1) buPROPion (WELLBUTRIN) 75 MG tablet  2) clonazePAM (KLONOPIN) 1 MG tablet.

## 2018-04-07 NOTE — Telephone Encounter (Signed)
Have refilled but will need an appt to refill again

## 2018-04-07 NOTE — Telephone Encounter (Signed)
Last written: 01/07/18 Last ov: 10/13/17 Next ov: 04/13/18 Contract:  01/13/17 UDS:  07/16/17

## 2018-04-13 ENCOUNTER — Encounter: Payer: Self-pay | Admitting: Family Medicine

## 2018-04-13 ENCOUNTER — Ambulatory Visit: Payer: Medicare HMO | Admitting: Family Medicine

## 2018-04-13 VITALS — BP 110/66 | HR 76 | Temp 97.1°F | Resp 18 | Wt 137.4 lb

## 2018-04-13 DIAGNOSIS — K219 Gastro-esophageal reflux disease without esophagitis: Secondary | ICD-10-CM

## 2018-04-13 DIAGNOSIS — M8580 Other specified disorders of bone density and structure, unspecified site: Secondary | ICD-10-CM

## 2018-04-13 DIAGNOSIS — D649 Anemia, unspecified: Secondary | ICD-10-CM

## 2018-04-13 DIAGNOSIS — R922 Inconclusive mammogram: Secondary | ICD-10-CM | POA: Diagnosis not present

## 2018-04-13 DIAGNOSIS — E782 Mixed hyperlipidemia: Secondary | ICD-10-CM | POA: Diagnosis not present

## 2018-04-13 DIAGNOSIS — G90511 Complex regional pain syndrome I of right upper limb: Secondary | ICD-10-CM | POA: Diagnosis not present

## 2018-04-13 DIAGNOSIS — Z1239 Encounter for other screening for malignant neoplasm of breast: Secondary | ICD-10-CM

## 2018-04-13 LAB — TSH: TSH: 2 u[IU]/mL (ref 0.35–4.50)

## 2018-04-13 LAB — CBC
HEMATOCRIT: 36.2 % (ref 36.0–46.0)
Hemoglobin: 12.2 g/dL (ref 12.0–15.0)
MCHC: 33.6 g/dL (ref 30.0–36.0)
MCV: 90.4 fl (ref 78.0–100.0)
PLATELETS: 210 10*3/uL (ref 150.0–400.0)
RBC: 4 Mil/uL (ref 3.87–5.11)
RDW: 13.3 % (ref 11.5–15.5)
WBC: 4.5 10*3/uL (ref 4.0–10.5)

## 2018-04-13 LAB — LIPID PANEL
CHOL/HDL RATIO: 3
CHOLESTEROL: 184 mg/dL (ref 0–200)
HDL: 65.2 mg/dL (ref >39.00)
LDL CALC: 104 mg/dL — AB (ref 0–99)
NonHDL: 118.53
TRIGLYCERIDES: 71 mg/dL (ref 0.0–149.0)
VLDL: 14.2 mg/dL (ref 0.0–40.0)

## 2018-04-13 LAB — COMPREHENSIVE METABOLIC PANEL
ALT: 9 U/L (ref 0–35)
AST: 11 U/L (ref 0–37)
Albumin: 4.2 g/dL (ref 3.5–5.2)
Alkaline Phosphatase: 47 U/L (ref 39–117)
BILIRUBIN TOTAL: 0.4 mg/dL (ref 0.2–1.2)
BUN: 23 mg/dL (ref 6–23)
CALCIUM: 9.6 mg/dL (ref 8.4–10.5)
CHLORIDE: 103 meq/L (ref 96–112)
CO2: 29 mEq/L (ref 19–32)
CREATININE: 0.77 mg/dL (ref 0.40–1.20)
GFR: 79.29 mL/min (ref >60.00)
Glucose, Bld: 98 mg/dL (ref 70–99)
Potassium: 5 mEq/L (ref 3.5–5.1)
SODIUM: 139 meq/L (ref 135–145)
TOTAL PROTEIN: 6.6 g/dL (ref 6.0–8.3)

## 2018-04-13 NOTE — Assessment & Plan Note (Signed)
Very mild on last check. Increase leafy greens, consider increased lean red meat and using cast iron cookware. Continue to monitor, report any concerns, recheck cmp

## 2018-04-13 NOTE — Assessment & Plan Note (Signed)
Encouraged to get adequate exercise, calcium and vitamin d intake 

## 2018-04-13 NOTE — Assessment & Plan Note (Signed)
Has graduated from PT in March 2019 and continues to golf and does PT exercises every other day at home. She notes this keeps her pain in check and keeps her strength up. She is not needing any pills and is using Voltaren gel with decent relief encouraged to add Lidocaine gel.  Continues to have nerve blocks with Dr Nelva Bush every other week now

## 2018-04-13 NOTE — Assessment & Plan Note (Signed)
Encouraged heart healthy diet, increase exercise, avoid trans fats, consider a krill oil cap daily 

## 2018-04-13 NOTE — Progress Notes (Signed)
Subjective:  I acted as a Education administrator for Dr. Charlett Blake. [place], [place]  Patient ID: Stephanie Barnes, female    DOB: 1951-06-28, 67 y.o.   MRN: [id number]  Chief Complaint  Patient presents with  . Follow-up    HPI  Patient is in today for a 6 month follow up. She is following up on her GERD, hyperlipidemia and other medical concerns. No recent febrile illness or acute hospitalizations. Denies CP/palp/SOB/HA/congestion/fevers/GI or GU c/o. Taking meds as prescribed. She is happy to be back playing golf with a special grip to accomodate her right hand weakness and pain. She has graduated from PT and is only doing home exercises and injections with Dr Nelva Bush every 2 weeks.   Patient Care Team: Mosie Lukes, MD as PCP - General (Family Medicine) Juanita Craver, MD as Consulting Physician (Gastroenterology) Percival Spanish, PT as Physical Therapist (Physical Therapy) Susa Day, MD as Consulting Physician (Orthopedic Surgery)   Past Medical History:  Diagnosis Date  . Abnormal EKG 06/13/2013  . Anemia 01/13/2016  . Anxiety   . Arthritis   . Complex regional pain syndrome 01/01/2016  . Depression   . 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 Medicare annual wellness visit, subsequent 10/02/2015  . Multiple allergies 03/31/2016  . Rotator cuff tear   . 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 Welcome to Medicare preventive visit 10/02/2015    Past Surgical History:  Procedure Laterality Date  . DENTAL SURGERY     Root cleaning   . MOUTH SURGERY  08/10/2017   front lower  . REFRACTIVE SURGERY Bilateral 2000  . ROTATOR CUFF REPAIR Right 2017  . TONSILLECTOMY      Family History  Problem Relation Age of Onset  . Heart disease Mother   . Arthritis Mother   . Heart disease Father   . COPD Father   . Mental illness Brother        bipolar  . Hypertension Brother   . Suicidality Brother   . Alcohol abuse Brother   .  Heart disease Maternal Aunt   . Heart disease Maternal Uncle   . Heart disease Paternal Aunt   . COPD Paternal 63   . Heart disease Paternal Uncle   . Heart disease Maternal Grandmother   . Heart disease Maternal Grandfather   . Heart disease Paternal Grandmother   . Heart disease Paternal Grandfather     Social History   Socioeconomic History  . Marital status: Single    Spouse name: Not on file  . Number of children: Not on file  . Years of education: Not on file  . Highest education level: Not on file  Occupational History  . Not on file  Social Needs  . Financial resource strain: Not on file  . Food insecurity:    Worry: Not on file    Inability: Not on file  . Transportation needs:    Medical: Not on file    Non-medical: Not on file  Tobacco Use  . Smoking status: Never Smoker  . Smokeless tobacco: Never Used  Substance and Sexual Activity  . Alcohol use: Yes    Alcohol/week: 0.6 oz    Types: 1 Glasses of wine per week    Comment: per week  . Drug use: No  . Sexual activity: Not Currently    Comment: lives alone, retired from [employer], no dietary restrictions  Lifestyle  . Physical activity:    Days per week: Not on file    Minutes per session: Not on file  . Stress: Not on file  Relationships  . Social connections:    Talks on phone: Not on file    Gets together: Not on file    Attends religious service: Not on file    Active member of club or organization: Not on file    Attends meetings of clubs or organizations: Not on file    Relationship status: Not on file  . Intimate partner violence:    Fear of current or ex partner: Not on file    Emotionally abused: Not on file    Physically abused: Not on file    Forced sexual activity: Not on file  Other Topics Concern  . Not on file  Social History Narrative  . Not on file    Outpatient Medications Prior to Visit  Medication Sig Dispense Refill  . Acetaminophen (TYLENOL ARTHRITIS PAIN  PO) Take 650 mg by mouth at bedtime.    . Ascorbic Acid (VITAMIN C) 1000 MG tablet Take 1,000 mg by mouth daily.    Marland Kitchen buPROPion (WELLBUTRIN) 75 MG tablet TAKE 1/2 (ONE-HALF) TABLET BY MOUTH ONCE DAILY 30 tablet 5  . Calcium Carbonate-Vit D-Min (CALCIUM 1200 PO) Take 1 tablet by mouth 2 (two) times daily.    . cetirizine (ZYRTEC) 10 MG tablet Take 10 mg by mouth daily as needed.     . chlorhexidine (PERIDEX) 0.12 % solution Use as directed 5 mLs in the mouth or throat at bedtime.     . clonazePAM (KLONOPIN) 1 MG tablet Take 1 tablet (1 mg total) by mouth 3 (three) times daily as needed. for anxiety 90 tablet 0  . Cyanocobalamin (VITAMIN B 12 PO) Take 1,000 mg by mouth daily.     Marland Kitchen ECHINACEA PO Take 1,200 mg by mouth 2 (two) times daily.    Marland Kitchen gabapentin (NEURONTIN) 300 MG capsule Take 2 capsules (600 mg total) by mouth at bedtime. (Patient taking differently: Take 300 mg by mouth at bedtime. ) 60 capsule 5  . HYDROmorphone (DILAUDID) 2 MG tablet Take 2 mg by mouth every evening.     . mometasone (ELOCON) 0.1 % ointment Apply topically daily. As needed 15 g 0  . Omega-3 Fatty Acids (SUPER OMEGA-3 PO) Take 1,200 mg by mouth 2 (two) times daily.    . pantoprazole (PROTONIX) 40 MG tablet Take 1 tablet (40 mg total) by mouth daily. 90 tablet 1  . Probiotic Product (PROBIOTIC DAILY PO) Take 1 tablet by mouth daily. NOW 10    . TURMERIC PO Take 894 mg by mouth. GAIA brand-Take 1 in the morning and 1 at night.    Marland Kitchen buPROPion (WELLBUTRIN) 75 MG tablet Take 1/2 by mouth daily 30 tablet 5   No facility-administered medications prior to visit.     Allergies  Allergen Reactions  . Aspirin Anaphylaxis and Swelling    Swelling of lips and fingers   . Celebrex [Celecoxib] Anaphylaxis, Hives and Shortness Of Breath  . Doxycycline Other (See Comments)    Scared throat   . Prednisone Shortness Of Breath and Other (See Comments)    Chest tightness   . Celexa [Citalopram] Other (See Comments)    Shakiness     . Fosamax [Alendronate Sodium] Hives  . Clindamycin/Lincomycin Nausea And Vomiting  . Other Hives and Nausea Only    Arthritec    Review of Systems  Constitutional: Negative for fever and malaise/fatigue.  HENT: Negative for congestion.   Eyes: Negative for blurred vision.  Respiratory: Negative for cough and shortness of breath.   Cardiovascular: Negative for chest pain, palpitations and leg swelling.  Gastrointestinal: Negative for vomiting.  Musculoskeletal: Positive for joint pain and myalgias. Negative for back pain.  Skin: Negative for rash.  Neurological: Positive for focal weakness. Negative for loss of consciousness, weakness and headaches.       Objective:    Physical Exam  Constitutional: She is oriented to person, place, and time. She appears well-developed and well-nourished. No distress.  HENT:  Head: Normocephalic and atraumatic.  Eyes: Conjunctivae are normal.  Neck: Normal range of motion. No thyromegaly present.  Cardiovascular: Normal rate and regular rhythm.  Pulmonary/Chest: Effort normal and breath sounds normal. She has no wheezes.  Abdominal: Soft. Bowel sounds are normal. There is no tenderness.  Musculoskeletal: Normal range of motion. She exhibits no edema or deformity.  Right hand splinted. Decrease ROM right shoulder. Only abduct to 90 degree laterally  Neurological: She is alert and oriented to person, place, and time.  Skin: Skin is warm and dry. She is not diaphoretic.  Psychiatric: She has a normal mood and affect.    BP 110/66 (BP Location: Left Arm, Patient Position: Sitting, Cuff Size: Normal)   Pulse 76   Temp (!) 97.1 F (36.2 C) (Oral)   Resp 18   Wt 137 lb 6.4 oz (62.3 kg)   HC 60" (152.4 cm)   SpO2 97%   BMI 26.83 kg/m  Wt Readings from Last 3 Encounters:  04/13/18 137 lb 6.4 oz (62.3 kg)  04/01/18 137 lb (62.1 kg)  10/13/17 136 lb (61.7 kg)   BP Readings from Last 3 Encounters:  04/13/18 110/66  04/01/18 120/68   10/13/17 124/66     Immunization History  Administered Date(s) Administered  . Influenza, Seasonal, Injecte, Preservative Fre 12/02/2012  . Influenza,inj,Quad PF,6+ Mos 07/31/2014, 10/02/2015, 09/19/2016  . Influenza-Unspecified 08/12/2017  . Pneumococcal Conjugate-13 10/02/2015  . Pneumococcal Polysaccharide-23 05/14/2017  . Zoster 12/02/2012    Health Maintenance  Topic Date Due  . HEMOGLOBIN A1C  06/13/1951  . FOOT EXAM  07/17/1960  . OPHTHALMOLOGY EXAM  07/17/1960  . URINE MICROALBUMIN  07/17/1960  . INFLUENZA VACCINE  06/10/2018  . MAMMOGRAM  10/31/2018  . TETANUS/TDAP  06/14/2023  . COLONOSCOPY  03/15/2024  . DEXA SCAN  Completed  . Hepatitis C Screening  Completed  . PNA vac Low Risk Adult  Completed    Lab Results  Component Value Date   WBC 5.8 10/13/2017   HGB 12.1 10/13/2017   HCT 36.1 10/13/2017   PLT 216.0 10/13/2017   GLUCOSE 90 10/13/2017   CHOL 228 (H) 10/13/2017   TRIG 150.0 (H) 10/13/2017   HDL 74.20 10/13/2017   LDLCALC 124 (H) 10/13/2017   ALT 12 10/13/2017   AST 10 10/13/2017   NA 140 10/13/2017   K 4.2 10/13/2017   CL 104 10/13/2017   CREATININE 0.79 10/13/2017   BUN 22 10/13/2017   CO2 28 10/13/2017   TSH 1.45 10/13/2017    Lab Results  Component Value Date   TSH 1.45 10/13/2017   Lab Results  Component Value Date   WBC 5.8 10/13/2017   HGB 12.1 10/13/2017   HCT 36.1 10/13/2017   MCV 91.0 10/13/2017   PLT 216.0 10/13/2017   Lab Results  Component Value Date   NA 140 10/13/2017   K 4.2 10/13/2017  CO2 28 10/13/2017   GLUCOSE 90 10/13/2017   BUN 22 10/13/2017   CREATININE 0.79 10/13/2017   BILITOT 0.3 10/13/2017   ALKPHOS 48 10/13/2017   AST 10 10/13/2017   ALT 12 10/13/2017   PROT 7.1 10/13/2017   ALBUMIN 4.4 10/13/2017   CALCIUM 9.1 10/13/2017   ANIONGAP 8 12/26/2015   GFR 77.10 10/13/2017   Lab Results  Component Value Date   CHOL 228 (H) 10/13/2017   Lab Results  Component Value Date   HDL 74.20  10/13/2017   Lab Results  Component Value Date   LDLCALC 124 (H) 10/13/2017   Lab Results  Component Value Date   TRIG 150.0 (H) 10/13/2017   Lab Results  Component Value Date   CHOLHDL 3 10/13/2017   No results found for: HGBA1C       Assessment & Plan:   Problem List Items Addressed This Visit    Complex regional pain syndrome type I (Location of Primary Source of Pain) (upper extremity) (Right) (Chronic)    Has graduated from PT in March 2019 and continues to golf and does PT exercises every other day at home. She notes this keeps her pain in check and keeps her strength up. She is not needing any pills and is using Voltaren gel with decent relief encouraged to add Lidocaine gel.  Continues to have nerve blocks with Dr Nelva Bush every other week now      GERD (gastroesophageal reflux disease)   Relevant Orders   Comprehensive metabolic panel   TSH   Hyperlipidemia - Primary    Encouraged heart healthy diet, increase exercise, avoid trans fats, consider a krill oil cap daily      Relevant Orders   Lipid panel   TSH   Osteopenia    Encouraged to get adequate exercise, calcium and vitamin d intake      Anemia    Very mild on last check. Increase leafy greens, consider increased lean red meat and using cast iron cookware. Continue to monitor, report any concerns, recheck cmp      Relevant Orders   CBC   Dense breast tissue on mammogram    Patient in need of screening mammogram. No complaints noted MGM ordered       Other Visit Diagnoses    Breast cancer screening       Relevant Orders   MM DIAG BREAST TOMO BILATERAL      I am having Stephanie Barnes maintain her Probiotic Product (PROBIOTIC DAILY PO), Calcium Carbonate-Vit D-Min (CALCIUM 1200 PO), cetirizine, mometasone, HYDROmorphone, chlorhexidine, Cyanocobalamin (VITAMIN B 12 PO), vitamin C, TURMERIC PO, Omega-3 Fatty Acids (SUPER OMEGA-3 PO), Acetaminophen (TYLENOL ARTHRITIS PAIN PO), ECHINACEA PO,  pantoprazole, gabapentin, clonazePAM, and buPROPion.  No orders of the defined types were placed in this encounter.   CMA served as Education administrator during this visit. History, Physical and Plan performed by medical provider. Documentation and orders reviewed and attested to.  Penni Homans, MD

## 2018-04-13 NOTE — Patient Instructions (Addendum)
Check with insurance regarding payment for the 3D MGM    Cholesterol Cholesterol is a white, waxy, fat-like substance that is needed by the human body in small amounts. The liver makes all the cholesterol we need. Cholesterol is carried from the liver by the blood through the blood vessels. Deposits of cholesterol (plaques) may build up on blood vessel (artery) walls. Plaques make the arteries narrower and stiffer. Cholesterol plaques increase the risk for heart attack and stroke. You cannot feel your cholesterol level even if it is very high. The only way to know that it is high is to have a blood test. Once you know your cholesterol levels, you should keep a record of the test results. Work with your health care provider to keep your levels in the desired range. What do the results mean?  Total cholesterol is a rough measure of all the cholesterol in your blood.  LDL (low-density lipoprotein) is the "bad" cholesterol. This is the type that causes plaque to build up on the artery walls. You want this level to be low.  HDL (high-density lipoprotein) is the "good" cholesterol because it cleans the arteries and carries the LDL away. You want this level to be high.  Triglycerides are fat that the body can either burn for energy or store. High levels are closely linked to heart disease. What are the desired levels of cholesterol?  Total cholesterol below 200.  LDL below 100 for people who are at risk, below 70 for people at very high risk.  HDL above 40 is good. A level of 60 or higher is considered to be protective against heart disease.  Triglycerides below 150. How can I lower my cholesterol? Diet Follow your diet program as told by your health care provider.  Choose fish or white meat chicken and [place], roasted or baked. Limit fatty cuts of red meat, fried foods, and processed meats, such as sausage and lunch meats.  Eat lots of fresh fruits and vegetables.  Choose whole grains,  beans, pasta, potatoes, and cereals.  Choose olive oil, corn oil, or canola oil, and use only small amounts.  Avoid butter, mayonnaise, shortening, or palm kernel oils.  Avoid foods with trans fats.  Drink skim or nonfat milk and eat low-fat or nonfat yogurt and cheeses. Avoid whole milk, cream, ice cream, egg yolks, and full-fat cheeses.  Healthier desserts include angel food cake, ginger snaps, animal crackers, hard candy, popsicles, and low-fat or nonfat frozen yogurt. Avoid pastries, cakes, pies, and cookies.  Exercise  Follow your exercise program as told by your health care provider. A regular program: ? Helps to decrease LDL and raise HDL. ? Helps with weight control.  Do things that increase your activity level, such as gardening, walking, and taking the stairs.  Ask your health care provider about ways that you can be more active in your daily life.  Medicine  Take over-the-counter and prescription medicines only as told by your health care provider. ? Medicine may be prescribed by your health care provider to help lower cholesterol and decrease the risk for heart disease. This is usually done if diet and exercise have failed to bring down cholesterol levels. ? If you have several risk factors, you may need medicine even if your levels are normal.  This information is not intended to replace advice given to you by your health care provider. Make sure you discuss any questions you have with your health care provider. Document Released: 07/22/2001 Document Revised: 05/24/2016 Document  Reviewed: 04/26/2016 Elsevier Interactive Patient Education  [employer].

## 2018-04-13 NOTE — Assessment & Plan Note (Signed)
Patient in need of screening mammogram. No complaints noted MGM ordered

## 2018-04-15 DIAGNOSIS — M89021 Algoneurodystrophy, right upper arm: Secondary | ICD-10-CM | POA: Diagnosis not present

## 2018-04-29 DIAGNOSIS — M89021 Algoneurodystrophy, right upper arm: Secondary | ICD-10-CM | POA: Diagnosis not present

## 2018-05-11 DIAGNOSIS — M89021 Algoneurodystrophy, right upper arm: Secondary | ICD-10-CM | POA: Diagnosis not present

## 2018-05-25 ENCOUNTER — Ambulatory Visit: Payer: Medicare HMO

## 2018-05-25 ENCOUNTER — Ambulatory Visit
Admission: RE | Admit: 2018-05-25 | Discharge: 2018-05-25 | Disposition: A | Discharge status: Home / Self Care | Payer: Medicare HMO | Source: Ambulatory Visit | Attending: Family Medicine | Admitting: Family Medicine

## 2018-05-25 DIAGNOSIS — Z1239 Encounter for other screening for malignant neoplasm of breast: Secondary | ICD-10-CM

## 2018-05-25 DIAGNOSIS — M89021 Algoneurodystrophy, right upper arm: Secondary | ICD-10-CM | POA: Diagnosis not present

## 2018-05-25 DIAGNOSIS — Z1231 Encounter for screening mammogram for malignant neoplasm of breast: Secondary | ICD-10-CM | POA: Diagnosis not present

## 2018-06-08 DIAGNOSIS — M89021 Algoneurodystrophy, right upper arm: Secondary | ICD-10-CM | POA: Diagnosis not present

## 2018-06-29 DIAGNOSIS — M89021 Algoneurodystrophy, right upper arm: Secondary | ICD-10-CM | POA: Diagnosis not present

## 2018-07-05 ENCOUNTER — Other Ambulatory Visit: Payer: Self-pay | Admitting: Family Medicine

## 2018-07-05 ENCOUNTER — Encounter: Payer: Self-pay | Admitting: Family Medicine

## 2018-07-05 DIAGNOSIS — F418 Other specified anxiety disorders: Secondary | ICD-10-CM

## 2018-07-05 MED ORDER — CLONAZEPAM 1 MG PO TABS: 1.0000 mg | ORAL_TABLET | Freq: Three times a day (TID) | ORAL | 0 refills | Status: DC | PRN

## 2018-07-05 NOTE — Telephone Encounter (Signed)
I have refilled but she will need a UDS and contract prior to any further refills so she may just want to get that done.

## 2018-07-05 NOTE — Telephone Encounter (Signed)
Requesting: clonazepam 1mg  tid prn Contract: 2018 UDS: 01/2017 Last OV: 04/13/18 Next Ov: 11/11/2018 Last refill: 04/07/18, #90, 0RF Database: no discrepancies found  Please advise.

## 2018-07-05 NOTE — Telephone Encounter (Signed)
Received refill request for clonazePAM (KLONOPIN) 1 MG tablet.   Last OV: 04/13/18 Last RF: 04/07/18  CSC signed: 01/15/2017 UDS: 01/15/2017

## 2018-07-13 DIAGNOSIS — M89021 Algoneurodystrophy, right upper arm: Secondary | ICD-10-CM | POA: Diagnosis not present

## 2018-07-20 NOTE — Telephone Encounter (Signed)
Author phoned pt. to notify her of need for UDS/contract prior to additional refills. Appointment made for 9/24 at 330PM with Dr. Charlett Blake.

## 2018-08-03 ENCOUNTER — Ambulatory Visit (INDEPENDENT_AMBULATORY_CARE_PROVIDER_SITE_OTHER): Payer: Medicare HMO | Admitting: Family Medicine

## 2018-08-03 VITALS — BP 122/68 | HR 62 | Temp 98.3°F | Resp 18 | Wt 135.8 lb

## 2018-08-03 DIAGNOSIS — R69 Illness, unspecified: Secondary | ICD-10-CM | POA: Diagnosis not present

## 2018-08-03 DIAGNOSIS — M8580 Other specified disorders of bone density and structure, unspecified site: Secondary | ICD-10-CM

## 2018-08-03 DIAGNOSIS — M89021 Algoneurodystrophy, right upper arm: Secondary | ICD-10-CM | POA: Diagnosis not present

## 2018-08-03 DIAGNOSIS — Z23 Encounter for immunization: Secondary | ICD-10-CM | POA: Diagnosis not present

## 2018-08-03 DIAGNOSIS — Z0000 Encounter for general adult medical examination without abnormal findings: Secondary | ICD-10-CM

## 2018-08-03 DIAGNOSIS — Z79899 Other long term (current) drug therapy: Secondary | ICD-10-CM

## 2018-08-03 DIAGNOSIS — G90511 Complex regional pain syndrome I of right upper limb: Secondary | ICD-10-CM | POA: Diagnosis not present

## 2018-08-03 DIAGNOSIS — E782 Mixed hyperlipidemia: Secondary | ICD-10-CM | POA: Diagnosis not present

## 2018-08-03 DIAGNOSIS — F418 Other specified anxiety disorders: Secondary | ICD-10-CM

## 2018-08-03 MED ORDER — CLONAZEPAM 1 MG PO TABS: 1.0000 mg | ORAL_TABLET | Freq: Every day | ORAL | 0 refills | Status: DC

## 2018-08-03 NOTE — Assessment & Plan Note (Signed)
Encouraged to get adequate exercise, calcium and vitamin d intake 

## 2018-08-03 NOTE — Patient Instructions (Addendum)
shingrix is the new shingles shot 2 shots over 2-6 months. At pharmacy  Cholesterol Cholesterol is a white, waxy, fat-like substance that is needed by the human body in small amounts. The liver makes all the cholesterol we need. Cholesterol is carried from the liver by the blood through the blood vessels. Deposits of cholesterol (plaques) may build up on blood vessel (artery) walls. Plaques make the arteries narrower and stiffer. Cholesterol plaques increase the risk for heart attack and stroke. You cannot feel your cholesterol level even if it is very high. The only way to know that it is high is to have a blood test. Once you know your cholesterol levels, you should keep a record of the test results. Work with your health care provider to keep your levels in the desired range. What do the results mean?  Total cholesterol is a rough measure of all the cholesterol in your blood.  LDL (low-density lipoprotein) is the "bad" cholesterol. This is the type that causes plaque to build up on the artery walls. You want this level to be low.  HDL (high-density lipoprotein) is the "good" cholesterol because it cleans the arteries and carries the LDL away. You want this level to be high.  Triglycerides are fat that the body can either burn for energy or store. High levels are closely linked to heart disease. What are the desired levels of cholesterol?  Total cholesterol below 200.  LDL below 100 for people who are at risk, below 70 for people at very high risk.  HDL above 40 is good. A level of 60 or higher is considered to be protective against heart disease.  Triglycerides below 150. How can I lower my cholesterol? Diet Follow your diet program as told by your health care provider.  Choose fish or white meat chicken and [place], roasted or baked. Limit fatty cuts of red meat, fried foods, and processed meats, such as sausage and lunch meats.  Eat lots of fresh fruits and vegetables.  Choose whole  grains, beans, pasta, potatoes, and cereals.  Choose olive oil, corn oil, or canola oil, and use only small amounts.  Avoid butter, mayonnaise, shortening, or palm kernel oils.  Avoid foods with trans fats.  Drink skim or nonfat milk and eat low-fat or nonfat yogurt and cheeses. Avoid whole milk, cream, ice cream, egg yolks, and full-fat cheeses.  Healthier desserts include angel food cake, ginger snaps, animal crackers, hard candy, popsicles, and low-fat or nonfat frozen yogurt. Avoid pastries, cakes, pies, and cookies.  Exercise  Follow your exercise program as told by your health care provider. A regular program: ? Helps to decrease LDL and raise HDL. ? Helps with weight control.  Do things that increase your activity level, such as gardening, walking, and taking the stairs.  Ask your health care provider about ways that you can be more active in your daily life.  Medicine  Take over-the-counter and prescription medicines only as told by your health care provider. ? Medicine may be prescribed by your health care provider to help lower cholesterol and decrease the risk for heart disease. This is usually done if diet and exercise have failed to bring down cholesterol levels. ? If you have several risk factors, you may need medicine even if your levels are normal.  This information is not intended to replace advice given to you by your health care provider. Make sure you discuss any questions you have with your health care provider. Document Released: 07/22/2001 Document Revised:  05/24/2016 Document Reviewed: 04/26/2016 Elsevier Interactive Patient Education  [employer].

## 2018-08-03 NOTE — Assessment & Plan Note (Signed)
Encouraged heart healthy diet, increase exercise, avoid trans fats, consider a krill oil cap daily 

## 2018-08-03 NOTE — Assessment & Plan Note (Addendum)
Continues to do exercises and  Manages her pain with splinting and exercises. She has been getting nerve blocks every 2 weeks then recently they stretched it to 3 weeks and her pain is still manageable. Is considering trying to stop them and see how she does. The topical lidocaine has been helping. Is also using CBD oil some also with good results.

## 2018-08-06 LAB — PAIN MGMT, PROFILE 8 W/CONF, U
6 Acetylmorphine: NEGATIVE ng/mL (ref <10)
Alcohol Metabolites: NEGATIVE ng/mL (ref <500)
Amphetamines: NEGATIVE ng/mL (ref <500)
BENZODIAZEPINES: NEGATIVE ng/mL (ref <100)
BUPRENORPHINE, URINE: NEGATIVE ng/mL (ref <5)
CREATININE: 51.3 mg/dL
Cocaine Metabolite: NEGATIVE ng/mL (ref <150)
Codeine: NEGATIVE ng/mL (ref <50)
HYDROCODONE: NEGATIVE ng/mL (ref <50)
HYDROMORPHONE: 338 ng/mL — AB (ref <50)
MARIJUANA METABOLITE: POSITIVE ng/mL — AB (ref <20)
MDMA: NEGATIVE ng/mL (ref <500)
MORPHINE: NEGATIVE ng/mL (ref <50)
Marijuana Metabolite: 132 ng/mL — ABNORMAL HIGH (ref <5)
Norhydrocodone: NEGATIVE ng/mL (ref <50)
OPIATES: POSITIVE ng/mL — AB (ref <100)
OXIDANT: NEGATIVE ug/mL (ref <200)
Oxycodone: NEGATIVE ng/mL (ref <100)
PH: 7.02 (ref 4.5–9.0)

## 2018-08-08 NOTE — Progress Notes (Signed)
Subjective:    Patient ID: Blaine Hamper, female    DOB: 02-20-1951, 67 y.o.   MRN: [id number]  No chief complaint on file.   HPI Patient is in today for follow up and overall she is doing well. He right arm pain and weakness continue to slowly improve. Her nerve blocks are now spaced out to 3 weeks and she is tolerating this. No recent flare of acute concerns. No flare in anxiety or anhedonia.Denies CP/palp/SOB/HA/congestion/fevers/GI or GU c/o. Taking meds as prescribed  Past Medical History:  Diagnosis Date  . Abnormal EKG 06/13/2013  . Anemia 01/13/2016  . Anxiety   . Arthritis   . Complex regional pain syndrome 01/01/2016  . Depression   . 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 Medicare annual wellness visit, subsequent 10/02/2015  . Multiple allergies 03/31/2016  . Rotator cuff tear   . 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 Welcome to Medicare preventive visit 10/02/2015    Past Surgical History:  Procedure Laterality Date  . DENTAL SURGERY     Root cleaning   . MOUTH SURGERY  08/10/2017   front lower  . REFRACTIVE SURGERY Bilateral 2000  . ROTATOR CUFF REPAIR Right 2017  . TONSILLECTOMY      Family History  Problem Relation Age of Onset  . Heart disease Mother   . Arthritis Mother   . Heart disease Father   . COPD Father   . Mental illness Brother        bipolar  . Hypertension Brother   . Suicidality Brother   . Alcohol abuse Brother   . Heart disease Maternal Aunt   . Heart disease Maternal Uncle   . Heart disease Paternal Aunt   . COPD Paternal 53   . Heart disease Paternal Uncle   . Heart disease Maternal Grandmother   . Heart disease Maternal Grandfather   . Heart disease Paternal Grandmother   . Heart disease Paternal Grandfather     Social History   Socioeconomic History  . Marital status: Single    Spouse name: Not on file  . Number of children: Not on file  . Years  of education: Not on file  . Highest education level: Not on file  Occupational History  . Not on file  Social Needs  . Financial resource strain: Not on file  . Food insecurity:    Worry: Not on file    Inability: Not on file  . Transportation needs:    Medical: Not on file    Non-medical: Not on file  Tobacco Use  . Smoking status: Never Smoker  . Smokeless tobacco: Never Used  Substance and Sexual Activity  . Alcohol use: Yes    Alcohol/week: 1.0 standard drinks    Types: 1 Glasses of wine per week    Comment: per week  . Drug use: No  . Sexual activity: Not Currently    Comment: lives alone, retired from [employer], no dietary restrictions  Lifestyle  . Physical activity:    Days per week: Not on file    Minutes per session: Not on file  . Stress: Not on file  Relationships  . Social connections:    Talks on phone: Not on file    Gets together: Not on file    Attends religious service: Not on file    Active member of club or organization: Not on file  Attends meetings of clubs or organizations: Not on file    Relationship status: Not on file  . Intimate partner violence:    Fear of current or ex partner: Not on file    Emotionally abused: Not on file    Physically abused: Not on file    Forced sexual activity: Not on file  Other Topics Concern  . Not on file  Social History Narrative  . Not on file    Outpatient Medications Prior to Visit  Medication Sig Dispense Refill  . Ascorbic Acid (VITAMIN C) 1000 MG tablet Take 1,000 mg by mouth daily.    Marland Kitchen buPROPion (WELLBUTRIN) 75 MG tablet TAKE 1/2 (ONE-HALF) TABLET BY MOUTH ONCE DAILY 30 tablet 5  . Calcium Carbonate-Vit D-Min (CALCIUM 1200 PO) Take 1 tablet by mouth 2 (two) times daily.    . cetirizine (ZYRTEC) 10 MG tablet Take 10 mg by mouth daily as needed.     . Cyanocobalamin (VITAMIN B 12 PO) Take 1,000 mg by mouth daily.     Marland Kitchen ECHINACEA PO Take 1,200 mg by mouth 2 (two) times daily.    Marland Kitchen  gabapentin (NEURONTIN) 300 MG capsule Take 2 capsules (600 mg total) by mouth at bedtime. (Patient taking differently: Take 300 mg by mouth at bedtime. ) 60 capsule 5  . HYDROmorphone (DILAUDID) 2 MG tablet Take 2 mg by mouth every evening.     . mometasone (ELOCON) 0.1 % ointment Apply topically daily. As needed 15 g 0  . Omega-3 Fatty Acids (SUPER OMEGA-3 PO) Take 1,200 mg by mouth 2 (two) times daily.    . Probiotic Product (PROBIOTIC DAILY PO) Take 1 tablet by mouth daily. NOW 10    . TURMERIC PO Take 894 mg by mouth. GAIA brand-Take 1 in the morning and 1 at night.    . clonazePAM (KLONOPIN) 1 MG tablet Take 1 tablet (1 mg total) by mouth 3 (three) times daily as needed. for anxiety 90 tablet 0  . Acetaminophen (TYLENOL ARTHRITIS PAIN PO) Take 650 mg by mouth at bedtime.    . chlorhexidine (PERIDEX) 0.12 % solution Use as directed 5 mLs in the mouth or throat at bedtime.     . pantoprazole (PROTONIX) 40 MG tablet Take 1 tablet (40 mg total) by mouth daily. 90 tablet 1   No facility-administered medications prior to visit.     Allergies  Allergen Reactions  . Aspirin Anaphylaxis and Swelling    Swelling of lips and fingers   . Celebrex [Celecoxib] Anaphylaxis, Hives and Shortness Of Breath  . Doxycycline Other (See Comments)    Scared throat   . Prednisone Shortness Of Breath and Other (See Comments)    Chest tightness   . Celexa [Citalopram] Other (See Comments)    Shakiness   . Fosamax [Alendronate Sodium] Hives  . Clindamycin/Lincomycin Nausea And Vomiting  . Other Hives and Nausea Only    Arthritec    Review of Systems  Constitutional: Negative for fever and malaise/fatigue.  HENT: Negative for congestion.   Eyes: Negative for blurred vision.  Respiratory: Negative for shortness of breath.   Cardiovascular: Negative for chest pain, palpitations and leg swelling.  Gastrointestinal: Negative for abdominal pain, blood in stool and nausea.  Genitourinary: Negative for  dysuria and frequency.  Musculoskeletal: Negative for falls.  Skin: Negative for rash.  Neurological: Positive for sensory change and focal weakness. Negative for dizziness, loss of consciousness and headaches.  Endo/Heme/Allergies: Negative for environmental allergies.  Psychiatric/Behavioral: Negative for  depression. The patient is not nervous/anxious.        Objective:    Physical Exam  Constitutional: She is oriented to person, place, and time. She appears well-developed and well-nourished. No distress.  HENT:  Head: Normocephalic and atraumatic.  Nose: Nose normal.  Eyes: Right eye exhibits no discharge. Left eye exhibits no discharge.  Neck: Normal range of motion. Neck supple.  Cardiovascular: Normal rate and regular rhythm.  No murmur heard. Pulmonary/Chest: Effort normal and breath sounds normal.  Abdominal: Soft. Bowel sounds are normal. There is no tenderness.  Musculoskeletal: She exhibits no edema.  Neurological: She is alert and oriented to person, place, and time.  RUE weakness and decreased ROM due to pain, sensory deficit.   Skin: Skin is warm and dry.  Psychiatric: She has a normal mood and affect.  Nursing note and vitals reviewed.   BP 122/68 (BP Location: Left Arm, Patient Position: Sitting, Cuff Size: Normal)   Pulse 62   Temp 98.3 F (36.8 C) (Oral)   Resp 18   Wt 135 lb 12.8 oz (61.6 kg)   SpO2 96%   BMI 26.52 kg/m  Wt Readings from Last 3 Encounters:  08/03/18 135 lb 12.8 oz (61.6 kg)  04/13/18 137 lb 6.4 oz (62.3 kg)  04/01/18 137 lb (62.1 kg)     Lab Results  Component Value Date   WBC 4.5 04/13/2018   HGB 12.2 04/13/2018   HCT 36.2 04/13/2018   PLT 210.0 04/13/2018   GLUCOSE 98 04/13/2018   CHOL 184 04/13/2018   TRIG 71.0 04/13/2018   HDL 65.20 04/13/2018   LDLCALC 104 (H) 04/13/2018   ALT 9 04/13/2018   AST 11 04/13/2018   NA 139 04/13/2018   K 5.0 04/13/2018   CL 103 04/13/2018   CREATININE 0.77 04/13/2018   BUN 23  04/13/2018   CO2 29 04/13/2018   TSH 2.00 04/13/2018    Lab Results  Component Value Date   TSH 2.00 04/13/2018   Lab Results  Component Value Date   WBC 4.5 04/13/2018   HGB 12.2 04/13/2018   HCT 36.2 04/13/2018   MCV 90.4 04/13/2018   PLT 210.0 04/13/2018   Lab Results  Component Value Date   NA 139 04/13/2018   K 5.0 04/13/2018   CO2 29 04/13/2018   GLUCOSE 98 04/13/2018   BUN 23 04/13/2018   CREATININE 0.77 04/13/2018   BILITOT 0.4 04/13/2018   ALKPHOS 47 04/13/2018   AST 11 04/13/2018   ALT 9 04/13/2018   PROT 6.6 04/13/2018   ALBUMIN 4.2 04/13/2018   CALCIUM 9.6 04/13/2018   ANIONGAP 8 12/26/2015   GFR 79.29 04/13/2018   Lab Results  Component Value Date   CHOL 184 04/13/2018   Lab Results  Component Value Date   HDL 65.20 04/13/2018   Lab Results  Component Value Date   LDLCALC 104 (H) 04/13/2018   Lab Results  Component Value Date   TRIG 71.0 04/13/2018   Lab Results  Component Value Date   CHOLHDL 3 04/13/2018   No results found for: HGBA1C     Assessment & Plan:   Problem List Items Addressed This Visit    Complex regional pain syndrome type I (Location of Primary Source of Pain) (upper extremity) (Right) (Chronic)    Continues to do exercises and  Manages her pain with splinting and exercises. She has been getting nerve blocks every 2 weeks then recently they stretched it to 3 weeks and her pain  is still manageable. Is considering trying to stop them and see how she does. The topical lidocaine has been helping. Is also using CBD oil some also with good results.       Relevant Medications   clonazePAM (KLONOPIN) 1 MG tablet   Other Relevant Orders   TSH   Depression with anxiety    Doing well on Wellbutin and using Clonazepam prn       Relevant Orders   TSH   Hyperlipidemia    Encouraged heart healthy diet, increase exercise, avoid trans fats, consider a krill oil cap daily      Relevant Orders   Comprehensive metabolic panel     Lipid panel   TSH   Osteopenia    Encouraged to get adequate exercise, calcium and vitamin d intake      Relevant Orders   Comprehensive metabolic panel   TSH   Preventative health care   Relevant Orders   CBC   Comprehensive metabolic panel   TSH    Other Visit Diagnoses    High risk medication use    -  Primary   Relevant Orders   Pain Mgmt, Profile 8 w/Conf, U (Completed)   Pain Mgmt, Profile 8 w/Conf, U      I have discontinued Larry Sierras. Goral's chlorhexidine, Acetaminophen (TYLENOL ARTHRITIS PAIN PO), pantoprazole, and clonazePAM. I am also having her start on clonazePAM. Additionally, I am having her maintain her Probiotic Product (PROBIOTIC DAILY PO), Calcium Carbonate-Vit D-Min (CALCIUM 1200 PO), cetirizine, mometasone, HYDROmorphone, Cyanocobalamin (VITAMIN B 12 PO), vitamin C, TURMERIC PO, Omega-3 Fatty Acids (SUPER OMEGA-3 PO), ECHINACEA PO, gabapentin, and buPROPion.  Meds ordered this encounter  Medications  . clonazePAM (KLONOPIN) 1 MG tablet    Sig: Take 1 tablet (1 mg total) by mouth at bedtime.    Dispense:  90 tablet    Refill:  0     Penni Homans, MD

## 2018-08-08 NOTE — Assessment & Plan Note (Signed)
Doing well on Wellbutin and using Clonazepam prn

## 2018-08-24 DIAGNOSIS — M89021 Algoneurodystrophy, right upper arm: Secondary | ICD-10-CM | POA: Diagnosis not present

## 2018-09-14 DIAGNOSIS — M89021 Algoneurodystrophy, right upper arm: Secondary | ICD-10-CM | POA: Diagnosis not present

## 2018-09-29 ENCOUNTER — Encounter: Payer: Self-pay | Admitting: Family Medicine

## 2018-10-02 ENCOUNTER — Encounter: Payer: Self-pay | Admitting: Family Medicine

## 2018-10-03 ENCOUNTER — Other Ambulatory Visit: Payer: Self-pay | Admitting: Family Medicine

## 2018-10-03 MED ORDER — CLONAZEPAM 1 MG PO TABS: 1.0000 mg | ORAL_TABLET | Freq: Every day | ORAL | 1 refills | Status: DC

## 2018-10-05 DIAGNOSIS — M89021 Algoneurodystrophy, right upper arm: Secondary | ICD-10-CM | POA: Diagnosis not present

## 2018-10-26 DIAGNOSIS — M89021 Algoneurodystrophy, right upper arm: Secondary | ICD-10-CM | POA: Diagnosis not present

## 2018-11-11 ENCOUNTER — Encounter: Payer: Medicare HMO | Admitting: Family Medicine

## 2018-11-16 DIAGNOSIS — M89021 Algoneurodystrophy, right upper arm: Secondary | ICD-10-CM | POA: Diagnosis not present

## 2018-11-30 DIAGNOSIS — M89021 Algoneurodystrophy, right upper arm: Secondary | ICD-10-CM | POA: Diagnosis not present

## 2018-12-07 ENCOUNTER — Other Ambulatory Visit (HOSPITAL_COMMUNITY)
Admission: RE | Admit: 2018-12-07 | Discharge: 2018-12-07 | Disposition: A | Discharge status: Home / Self Care | Payer: Medicare HMO | Source: Ambulatory Visit | Attending: Family Medicine | Admitting: Family Medicine

## 2018-12-07 ENCOUNTER — Encounter: Payer: Self-pay | Admitting: Family Medicine

## 2018-12-07 ENCOUNTER — Ambulatory Visit (INDEPENDENT_AMBULATORY_CARE_PROVIDER_SITE_OTHER): Payer: Medicare HMO | Admitting: Family Medicine

## 2018-12-07 DIAGNOSIS — D649 Anemia, unspecified: Secondary | ICD-10-CM

## 2018-12-07 DIAGNOSIS — Z0000 Encounter for general adult medical examination without abnormal findings: Secondary | ICD-10-CM

## 2018-12-07 DIAGNOSIS — G4700 Insomnia, unspecified: Secondary | ICD-10-CM | POA: Diagnosis not present

## 2018-12-07 DIAGNOSIS — G90511 Complex regional pain syndrome I of right upper limb: Secondary | ICD-10-CM | POA: Diagnosis not present

## 2018-12-07 DIAGNOSIS — Z124 Encounter for screening for malignant neoplasm of cervix: Secondary | ICD-10-CM | POA: Diagnosis not present

## 2018-12-07 DIAGNOSIS — E782 Mixed hyperlipidemia: Secondary | ICD-10-CM

## 2018-12-07 DIAGNOSIS — E559 Vitamin D deficiency, unspecified: Secondary | ICD-10-CM

## 2018-12-07 DIAGNOSIS — M779 Enthesopathy, unspecified: Secondary | ICD-10-CM | POA: Diagnosis not present

## 2018-12-07 DIAGNOSIS — M778 Other enthesopathies, not elsewhere classified: Secondary | ICD-10-CM

## 2018-12-07 NOTE — Assessment & Plan Note (Signed)
Supplement and monitor 

## 2018-12-07 NOTE — Assessment & Plan Note (Signed)
In thumb second to [employer]. Following with [facility] is awaiting an appt with Dr Amedeo Plenty so they can build her anew brace

## 2018-12-07 NOTE — Assessment & Plan Note (Addendum)
Encouraged good sleep hygiene such as dark, quiet room. No blue/green glowing lights such as computer screens in bedroom. No alcohol or stimulants in evening. Cut down on caffeine as able. Regular exercise is helpful but not just prior to bed time. Using CBD gummies for sleep and they contain Melatonin

## 2018-12-07 NOTE — Assessment & Plan Note (Signed)
Encouraged heart healthy diet, increase exercise, avoid trans fats, consider a krill oil cap daily 

## 2018-12-07 NOTE — Assessment & Plan Note (Signed)
Patient encouraged to maintain heart healthy diet, regular exercise, adequate sleep. Consider daily probiotics. Take medications as prescribed. Given and reviewed copy of ACP documents from [place] [employer] [place] and encouraged to complete and return 

## 2018-12-07 NOTE — Assessment & Plan Note (Signed)
Increase leafy greens, consider increased lean red meat and using cast iron cookware. Continue to monitor, report any concerns 

## 2018-12-07 NOTE — Assessment & Plan Note (Signed)
Pap today, no concerns on exam.  

## 2018-12-07 NOTE — Patient Instructions (Addendum)
shingrix is the new shingles shot, 2 shots over 2-6 months at pharmacy  Need copy of advanced directives   Preventive Care 61 Years and Older, Female Preventive care refers to lifestyle choices and visits with your health care provider that can promote health and wellness. What does preventive care include?  A yearly physical exam. This is also called an annual well check.  Dental exams once or twice a year.  Routine eye exams. Ask your health care provider how often you should have your eyes checked.  Personal lifestyle choices, including: ? Daily care of your teeth and gums. ? Regular physical activity. ? Eating a healthy diet. ? Avoiding tobacco and drug use. ? Limiting alcohol use. ? Practicing safe sex. ? Taking low-dose aspirin every day. ? Taking vitamin and mineral supplements as recommended by your health care provider. What happens during an annual well check? The services and screenings done by your health care provider during your annual well check will depend on your age, overall health, lifestyle risk factors, and family history of disease. Counseling Your health care provider may ask you questions about your:  Alcohol use.  Tobacco use.  Drug use.  Emotional well-being.  Home and relationship well-being.  Sexual activity.  Eating habits.  History of falls.  Memory and ability to understand (cognition).  Work and work Statistician.  Reproductive health.  Screening You may have the following tests or measurements:  Height, weight, and BMI.  Blood pressure.  Lipid and cholesterol levels. These may be checked every 5 years, or more frequently if you are over 47 years old.  Skin check.  Lung cancer screening. You may have this screening every year starting at age 69 if you have a 30-pack-year history of smoking and currently smoke or have quit within the past 15 years.  Colorectal cancer screening. All adults should have this screening starting  at age 28 and continuing until age 37. You will have tests every 1-10 years, depending on your results and the type of screening test. People at increased risk should start screening at an earlier age. Screening tests may include: ? Guaiac-based fecal occult blood testing. ? Fecal immunochemical test (FIT). ? Stool DNA test. ? Virtual colonoscopy. ? Sigmoidoscopy. During this test, a flexible tube with a tiny camera (sigmoidoscope) is used to examine your rectum and lower colon. The sigmoidoscope is inserted through your anus into your rectum and lower colon. ? Colonoscopy. During this test, a long, thin, flexible tube with a tiny camera (colonoscope) is used to examine your entire colon and rectum.  Hepatitis C blood test.  Hepatitis B blood test.  Sexually transmitted disease (STD) testing.  Diabetes screening. This is done by checking your blood sugar (glucose) after you have not eaten for a while (fasting). You may have this done every 1-3 years.  Bone density scan. This is done to screen for osteoporosis. You may have this done starting at age 39.  Mammogram. This may be done every 1-2 years. Talk to your health care provider about how often you should have regular mammograms. Talk with your health care provider about your test results, treatment options, and if necessary, the need for more tests. Vaccines Your health care provider may recommend certain vaccines, such as:  Influenza vaccine. This is recommended every year.  Tetanus, diphtheria, and acellular pertussis (Tdap, Td) vaccine. You may need a Td booster every 10 years.  Varicella vaccine. You may need this if you have not been vaccinated.  Zoster vaccine. You may need this after age 3.  Measles, mumps, and rubella (MMR) vaccine. You may need at least one dose of MMR if you were born in 1957 or later. You may also need a second dose.  Pneumococcal 13-valent conjugate (PCV13) vaccine. One dose is recommended after age  87.  Pneumococcal polysaccharide (PPSV23) vaccine. One dose is recommended after age 47.  Meningococcal vaccine. You may need this if you have certain conditions.  Hepatitis A vaccine. You may need this if you have certain conditions or if you travel or work in places where you may be exposed to hepatitis A.  Hepatitis B vaccine. You may need this if you have certain conditions or if you travel or work in places where you may be exposed to hepatitis B.  Haemophilus influenzae type b (Hib) vaccine. You may need this if you have certain conditions. Talk to your health care provider about which screenings and vaccines you need and how often you need them. This information is not intended to replace advice given to you by your health care provider. Make sure you discuss any questions you have with your health care provider. Document Released: 11/23/2015 Document Revised: 12/17/2017 Document Reviewed: 08/28/2015 Elsevier Interactive Patient Education  2019 [employer].

## 2018-12-08 ENCOUNTER — Encounter: Payer: Self-pay | Admitting: Family Medicine

## 2018-12-08 ENCOUNTER — Telehealth: Payer: Self-pay | Admitting: *Deleted

## 2018-12-08 LAB — LIPID PANEL
CHOLESTEROL: 205 mg/dL — AB (ref 0–200)
HDL: 68.6 mg/dL (ref >39.00)
LDL Cholesterol: 100 mg/dL — ABNORMAL HIGH (ref 0–99)
NonHDL: 136.38
TRIGLYCERIDES: 180 mg/dL — AB (ref 0.0–149.0)
Total CHOL/HDL Ratio: 3
VLDL: 36 mg/dL (ref 0.0–40.0)

## 2018-12-08 LAB — CBC
HCT: 38.4 % (ref 36.0–46.0)
Hemoglobin: 13.2 g/dL (ref 12.0–15.0)
MCHC: 34.3 g/dL (ref 30.0–36.0)
MCV: 91.1 fl (ref 78.0–100.0)
Platelets: 220 10*3/uL (ref 150.0–400.0)
RBC: 4.21 Mil/uL (ref 3.87–5.11)
RDW: 13.3 % (ref 11.5–15.5)
WBC: 6.7 10*3/uL (ref 4.0–10.5)

## 2018-12-08 LAB — TSH: TSH: 1.91 u[IU]/mL (ref 0.35–4.50)

## 2018-12-08 LAB — COMPREHENSIVE METABOLIC PANEL
ALBUMIN: 4.4 g/dL (ref 3.5–5.2)
ALK PHOS: 48 U/L (ref 39–117)
ALT: 13 U/L (ref 0–35)
AST: 16 U/L (ref 0–37)
BUN: 21 mg/dL (ref 6–23)
CO2: 29 mEq/L (ref 19–32)
Calcium: 9.6 mg/dL (ref 8.4–10.5)
Chloride: 99 mEq/L (ref 96–112)
Creatinine, Ser: 0.91 mg/dL (ref 0.40–1.20)
GFR: 61.4 mL/min (ref >60.00)
GLUCOSE: 100 mg/dL — AB (ref 70–99)
Potassium: 4.4 mEq/L (ref 3.5–5.1)
SODIUM: 137 meq/L (ref 135–145)
Total Bilirubin: 0.3 mg/dL (ref 0.2–1.2)
Total Protein: 6.9 g/dL (ref 6.0–8.3)

## 2018-12-08 LAB — VITAMIN D 25 HYDROXY (VIT D DEFICIENCY, FRACTURES): VITD: >120 ng/mL

## 2018-12-08 NOTE — Telephone Encounter (Signed)
Advise patient: Stop OTC vitamin D/calcium supplements. Okay to continue with multivitamins. Then forward this note to PCP.

## 2018-12-08 NOTE — Telephone Encounter (Signed)
Yes, thank you. She should stop all Vitamin D

## 2018-12-08 NOTE — Telephone Encounter (Signed)
Spoke w/ Pt- informed of results and recomendatons. States she is unable to take multivitamins because they are aspirin based (?). She stated last time she had her vit D checked it was normal and she was only take one a day and d/t her family hx of osteoporosis she increased to 2 daily. Informed to d/c them altogether. Pt hesitantly verbalized understanding.

## 2018-12-08 NOTE — Telephone Encounter (Signed)
CRITICAL VALUE STICKER  CRITICAL VALUE:  Vit D 179.66  RECEIVER (on-site recipient of call): Kelle Darting, [place] NOTIFIED: 12/08/18 @ 11:16am  MESSENGER (representative from lab): Saa  MD NOTIFIED: Dr Larose Kells as doc of the day  TIME OF NOTIFICATION:  11:18am  RESPONSE:

## 2018-12-09 LAB — CYTOLOGY - PAP: DIAGNOSIS: NEGATIVE

## 2018-12-09 NOTE — Progress Notes (Signed)
Subjective:    Patient ID: Stephanie Barnes, female    DOB: [birth year], 68 y.o.   MRN: [id number]  No chief complaint on file.   HPI Patient is in today for annual preventative exam and Pap smear as well as follow-up on chronic medical concerns including complex regional pain syndrome, hyperlipidemia and reflux.  Overall she is doing well.  She reports she is here activities of daily living well.  No recent febrile illness or hospitalizations.  She does note she has had a flare in her CRPS symptoms since working hard on Christmas cookies in December.  Her right thumb is significantly more pain than usual. Denies CP/palp/SOB/HA/congestion/fevers/GI or GU c/o. Taking meds as prescribed  Past Medical History:  Diagnosis Date  . Abnormal EKG 06/13/2013  . Anemia 01/13/2016  . Anxiety   . Arthritis   . Complex regional pain syndrome 01/01/2016  . Depression   . 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 Medicare annual wellness visit, subsequent 10/02/2015  . Multiple allergies 03/31/2016  . Rotator cuff tear   . 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 Welcome to Medicare preventive visit 10/02/2015    Past Surgical History:  Procedure Laterality Date  . DENTAL SURGERY     Root cleaning   . MOUTH SURGERY  08/10/2017   front lower  . REFRACTIVE SURGERY Bilateral 2000  . ROTATOR CUFF REPAIR Right 2017  . TONSILLECTOMY      Family History  Problem Relation Age of Onset  . Heart disease Mother   . Arthritis Mother   . Heart disease Father   . COPD Father   . Mental illness Brother        bipolar  . Hypertension Brother   . Suicidality Brother   . Alcohol abuse Brother   . Heart disease Maternal Aunt   . Heart disease Maternal Uncle   . Heart disease Paternal Aunt   . COPD Paternal 57   . Heart disease Paternal Uncle   . Heart disease Maternal Grandmother   . Heart disease Maternal Grandfather   . Heart disease  Paternal Grandmother   . Heart disease Paternal Grandfather     Social History   Socioeconomic History  . Marital status: Single    Spouse name: Not on file  . Number of children: Not on file  . Years of education: Not on file  . Highest education level: Not on file  Occupational History  . Not on file  Social Needs  . Financial resource strain: Not on file  . Food insecurity:    Worry: Not on file    Inability: Not on file  . Transportation needs:    Medical: Not on file    Non-medical: Not on file  Tobacco Use  . Smoking status: Never Smoker  . Smokeless tobacco: Never Used  Substance and Sexual Activity  . Alcohol use: Yes    Alcohol/week: 1.0 standard drinks    Types: 1 Glasses of wine per week    Comment: per week  . Drug use: No  . Sexual activity: Not Currently    Comment: lives alone, retired from [employer], no dietary restrictions  Lifestyle  . Physical activity:    Days per week: Not on file    Minutes per session: Not on file  . Stress: Not on file  Relationships  . Social connections:    Talks on  phone: Not on file    Gets together: Not on file    Attends religious service: Not on file    Active member of club or organization: Not on file    Attends meetings of clubs or organizations: Not on file    Relationship status: Not on file  . Intimate partner violence:    Fear of current or ex partner: Not on file    Emotionally abused: Not on file    Physically abused: Not on file    Forced sexual activity: Not on file  Other Topics Concern  . Not on file  Social History Narrative  . Not on file    Outpatient Medications Prior to Visit  Medication Sig Dispense Refill  . Ascorbic Acid (VITAMIN C) 1000 MG tablet Take 1,000 mg by mouth daily.    Marland Kitchen buPROPion (WELLBUTRIN) 75 MG tablet TAKE 1/2 (ONE-HALF) TABLET BY MOUTH ONCE DAILY 30 tablet 5  . Calcium Carbonate-Vit D-Min (CALCIUM 1200 PO) Take 1 tablet by mouth 2 (two) times daily.    .  cetirizine (ZYRTEC) 10 MG tablet Take 10 mg by mouth daily as needed.     . clonazePAM (KLONOPIN) 1 MG tablet Take 1 tablet (1 mg total) by mouth at bedtime. 90 tablet 1  . Cyanocobalamin (VITAMIN B 12 PO) Take 1,000 mg by mouth daily.     Marland Kitchen ECHINACEA PO Take 1,200 mg by mouth 2 (two) times daily.    . mometasone (ELOCON) 0.1 % ointment Apply topically daily. As needed 15 g 0  . Omega-3 Fatty Acids (SUPER OMEGA-3 PO) Take 1,200 mg by mouth 2 (two) times daily.    . Probiotic Product (PROBIOTIC DAILY PO) Take 1 tablet by mouth daily. NOW 10    . TURMERIC PO Take 894 mg by mouth. GAIA brand-Take 1 in the morning and 1 at night.    . gabapentin (NEURONTIN) 300 MG capsule Take 2 capsules (600 mg total) by mouth at bedtime. (Patient taking differently: Take 300 mg by mouth at bedtime. ) 60 capsule 5  . HYDROmorphone (DILAUDID) 2 MG tablet Take 2 mg by mouth every evening.      No facility-administered medications prior to visit.     Allergies  Allergen Reactions  . Aspirin Anaphylaxis and Swelling    Swelling of lips and fingers   . Celebrex [Celecoxib] Anaphylaxis, Hives and Shortness Of Breath  . Doxycycline Other (See Comments)    Scared throat   . Prednisone Shortness Of Breath and Other (See Comments)    Chest tightness   . Celexa [Citalopram] Other (See Comments)    Shakiness   . Fosamax [Alendronate Sodium] Hives  . Clindamycin/Lincomycin Nausea And Vomiting  . Other Hives and Nausea Only    Arthritec    Review of Systems  Constitutional: Negative for chills, fever and malaise/fatigue.  HENT: Negative for congestion and hearing loss.   Eyes: Negative for discharge.  Respiratory: Negative for cough, sputum production and shortness of breath.   Cardiovascular: Negative for chest pain, palpitations and leg swelling.  Gastrointestinal: Negative for abdominal pain, blood in stool, constipation, diarrhea, heartburn, nausea and vomiting.  Genitourinary: Negative for dysuria,  frequency, hematuria and urgency.  Musculoskeletal: Positive for joint pain and myalgias. Negative for back pain and falls.  Skin: Negative for rash.  Neurological: Positive for focal weakness. Negative for dizziness, sensory change, loss of consciousness, weakness and headaches.  Endo/Heme/Allergies: Negative for environmental allergies. Does not bruise/bleed easily.  Psychiatric/Behavioral: Negative for depression  and suicidal ideas. The patient is not nervous/anxious and does not have insomnia.        Objective:    Physical Exam Constitutional:      General: She is not in acute distress.    Appearance: She is not diaphoretic.  HENT:     Head: Normocephalic and atraumatic.     Right Ear: External ear normal.     Left Ear: External ear normal.     Nose: Nose normal.     Mouth/Throat:     Pharynx: No oropharyngeal exudate.  Eyes:     General: No scleral icterus.       Right eye: No discharge.        Left eye: No discharge.     Conjunctiva/sclera: Conjunctivae normal.     Pupils: Pupils are equal, round, and reactive to light.  Neck:     Musculoskeletal: Normal range of motion and neck supple.     Thyroid: No thyromegaly.  Cardiovascular:     Rate and Rhythm: Normal rate and regular rhythm.     Heart sounds: Normal heart sounds. No murmur.  Pulmonary:     Effort: Pulmonary effort is normal. No respiratory distress.     Breath sounds: Normal breath sounds. No wheezing or rales.  Abdominal:     General: Bowel sounds are normal. There is no distension.     Palpations: Abdomen is soft. There is no mass.     Tenderness: There is no abdominal tenderness.  Genitourinary:    General: Normal vulva.     Vagina: No vaginal discharge.     Rectum: Normal.  Musculoskeletal: Normal range of motion.        General: Deformity present. No tenderness.     Right lower leg: No edema.     Left lower leg: No edema.  Lymphadenopathy:     Cervical: No cervical adenopathy.  Skin:    General:  Skin is warm and dry.     Findings: No rash.  Neurological:     Mental Status: She is alert and oriented to person, place, and time.     Cranial Nerves: No cranial nerve deficit.     Coordination: Coordination normal.     Deep Tendon Reflexes: Reflexes are normal and symmetric. Reflexes normal.     BP 100/70 (BP Location: Left Arm, Patient Position: Sitting, Cuff Size: Normal)   Pulse 80   Temp 97.8 F (36.6 C) (Oral)   Resp 18   Ht 4' 11.25" (1.505 m)   Wt 125 lb 9.6 oz (57 kg)   SpO2 98%   BMI 25.15 kg/m  Wt Readings from Last 3 Encounters:  12/07/18 125 lb 9.6 oz (57 kg)  08/03/18 135 lb 12.8 oz (61.6 kg)  04/13/18 137 lb 6.4 oz (62.3 kg)     Lab Results  Component Value Date   WBC 6.7 12/07/2018   HGB 13.2 12/07/2018   HCT 38.4 12/07/2018   PLT 220.0 12/07/2018   GLUCOSE 100 (H) 12/07/2018   CHOL 205 (H) 12/07/2018   TRIG 180.0 (H) 12/07/2018   HDL 68.60 12/07/2018   LDLCALC 100 (H) 12/07/2018   ALT 13 12/07/2018   AST 16 12/07/2018   NA 137 12/07/2018   K 4.4 12/07/2018   CL 99 12/07/2018   CREATININE 0.91 12/07/2018   BUN 21 12/07/2018   CO2 29 12/07/2018   TSH 1.91 12/07/2018    Lab Results  Component Value Date   TSH 1.91 12/07/2018  Lab Results  Component Value Date   WBC 6.7 12/07/2018   HGB 13.2 12/07/2018   HCT 38.4 12/07/2018   MCV 91.1 12/07/2018   PLT 220.0 12/07/2018   Lab Results  Component Value Date   NA 137 12/07/2018   K 4.4 12/07/2018   CO2 29 12/07/2018   GLUCOSE 100 (H) 12/07/2018   BUN 21 12/07/2018   CREATININE 0.91 12/07/2018   BILITOT 0.3 12/07/2018   ALKPHOS 48 12/07/2018   AST 16 12/07/2018   ALT 13 12/07/2018   PROT 6.9 12/07/2018   ALBUMIN 4.4 12/07/2018   CALCIUM 9.6 12/07/2018   ANIONGAP 8 12/26/2015   GFR 61.40 12/07/2018   Lab Results  Component Value Date   CHOL 205 (H) 12/07/2018   Lab Results  Component Value Date   HDL 68.60 12/07/2018   Lab Results  Component Value Date   LDLCALC 100 (H)  12/07/2018   Lab Results  Component Value Date   TRIG 180.0 (H) 12/07/2018   Lab Results  Component Value Date   CHOLHDL 3 12/07/2018   No results found for: HGBA1C     Assessment & Plan:   Problem List Items Addressed This Visit    Complex regional pain syndrome type I (Location of Primary Source of Pain) (upper extremity) (Right) (Chronic)    Continues to work with PT and ortho and performs her exercises regularly      Tendonitis of right hand    In thumb second to CRPS. Following with [facility] is awaiting an appt with Dr Amedeo Plenty so they can build her anew brace      Insomnia    Encouraged good sleep hygiene such as dark, quiet room. No blue/green glowing lights such as computer screens in bedroom. No alcohol or stimulants in evening. Cut down on caffeine as able. Regular exercise is helpful but not just prior to bed time. Using CBD gummies for sleep and they contain Melatonin      Hyperlipidemia    Encouraged heart healthy diet, increase exercise, avoid trans fats, consider a krill oil cap daily      Relevant Orders   Comprehensive metabolic panel (Completed)   Lipid panel (Completed)   TSH (Completed)   Preventative health care    Patient encouraged to maintain heart healthy diet, regular exercise, adequate sleep. Consider daily probiotics. Take medications as prescribed. Given and reviewed copy of ACP documents from [employer] and encouraged to complete and return      Relevant Orders   TSH (Completed)   Anemia    Increase leafy greens, consider increased lean red meat and using cast iron cookware. Continue to monitor, report any concerns      Relevant Orders   CBC (Completed)   Vitamin D deficiency    Supplement and monitor      Relevant Orders   TSH (Completed)   VITAMIN D 25 Hydroxy (Vit-D Deficiency, Fractures) (Completed)   Cervical cancer screening    Pap today, no concerns on exam.       Relevant Orders   Cytology - PAP( [facility]) (Completed)      I have discontinued Larry Sierras. Shill's HYDROmorphone and gabapentin. I am also having her maintain her Probiotic Product (PROBIOTIC DAILY PO), Calcium Carbonate-Vit D-Min (CALCIUM 1200 PO), cetirizine, mometasone, Cyanocobalamin (VITAMIN B 12 PO), vitamin C, TURMERIC PO, Omega-3 Fatty Acids (SUPER OMEGA-3 PO), ECHINACEA PO, buPROPion, and clonazePAM.  No orders of the defined types were placed in this  encounter.    Penni Homans, MD

## 2018-12-09 NOTE — Assessment & Plan Note (Signed)
Continues to work with PT and ortho and performs her exercises regularly

## 2018-12-14 DIAGNOSIS — M89021 Algoneurodystrophy, right upper arm: Secondary | ICD-10-CM | POA: Diagnosis not present

## 2018-12-28 DIAGNOSIS — M89021 Algoneurodystrophy, right upper arm: Secondary | ICD-10-CM | POA: Diagnosis not present

## 2019-01-01 ENCOUNTER — Encounter: Payer: Self-pay | Admitting: Family Medicine

## 2019-01-04 ENCOUNTER — Other Ambulatory Visit: Payer: Self-pay

## 2019-01-04 MED ORDER — CLONAZEPAM 1 MG PO TABS: 1.0000 mg | ORAL_TABLET | Freq: Every day | ORAL | 1 refills | Status: DC

## 2019-01-04 NOTE — Telephone Encounter (Signed)
Requesting:Klonopin Contract:yes UDS:low risk next screen 02/01/19 Last OV:12/07/18 Next OV:06/07/19 Last Refill:10/03/18  #[id number] Database:   Please advise

## 2019-01-11 DIAGNOSIS — M89021 Algoneurodystrophy, right upper arm: Secondary | ICD-10-CM | POA: Diagnosis not present

## 2019-01-19 DIAGNOSIS — M79644 Pain in right finger(s): Secondary | ICD-10-CM | POA: Diagnosis not present

## 2019-01-25 DIAGNOSIS — M89021 Algoneurodystrophy, right upper arm: Secondary | ICD-10-CM | POA: Diagnosis not present

## 2019-02-12 ENCOUNTER — Encounter: Payer: Self-pay | Admitting: Family Medicine

## 2019-02-14 ENCOUNTER — Encounter: Payer: Self-pay | Admitting: Family Medicine

## 2019-03-07 ENCOUNTER — Emergency Department (HOSPITAL_COMMUNITY): Payer: Medicare HMO

## 2019-03-07 ENCOUNTER — Encounter (HOSPITAL_COMMUNITY): Payer: Self-pay

## 2019-03-07 ENCOUNTER — Other Ambulatory Visit: Payer: Self-pay

## 2019-03-07 ENCOUNTER — Emergency Department (HOSPITAL_COMMUNITY)
Admission: EM | Admit: 2019-03-07 | Discharge: 2019-03-07 | Disposition: A | Discharge status: Home / Self Care | Payer: Medicare HMO | Attending: Emergency Medicine | Admitting: Emergency Medicine

## 2019-03-07 DIAGNOSIS — Y999 Unspecified external cause status: Secondary | ICD-10-CM | POA: Diagnosis not present

## 2019-03-07 DIAGNOSIS — S52592A Other fractures of lower end of left radius, initial encounter for closed fracture: Secondary | ICD-10-CM | POA: Diagnosis not present

## 2019-03-07 DIAGNOSIS — Y929 Unspecified place or not applicable: Secondary | ICD-10-CM | POA: Diagnosis not present

## 2019-03-07 DIAGNOSIS — S52615A Nondisplaced fracture of left ulna styloid process, initial encounter for closed fracture: Secondary | ICD-10-CM | POA: Diagnosis not present

## 2019-03-07 DIAGNOSIS — S62102A Fracture of unspecified carpal bone, left wrist, initial encounter for closed fracture: Secondary | ICD-10-CM | POA: Diagnosis not present

## 2019-03-07 DIAGNOSIS — W11XXXA Fall on and from ladder, initial encounter: Secondary | ICD-10-CM | POA: Diagnosis not present

## 2019-03-07 DIAGNOSIS — S299XXA Unspecified injury of thorax, initial encounter: Secondary | ICD-10-CM | POA: Diagnosis not present

## 2019-03-07 DIAGNOSIS — Z79899 Other long term (current) drug therapy: Secondary | ICD-10-CM | POA: Diagnosis not present

## 2019-03-07 DIAGNOSIS — R52 Pain, unspecified: Secondary | ICD-10-CM | POA: Diagnosis not present

## 2019-03-07 DIAGNOSIS — S6292XA Unspecified fracture of left wrist and hand, initial encounter for closed fracture: Secondary | ICD-10-CM | POA: Diagnosis not present

## 2019-03-07 DIAGNOSIS — S42292A Other displaced fracture of upper end of left humerus, initial encounter for closed fracture: Secondary | ICD-10-CM | POA: Diagnosis not present

## 2019-03-07 DIAGNOSIS — M25519 Pain in unspecified shoulder: Secondary | ICD-10-CM | POA: Diagnosis not present

## 2019-03-07 DIAGNOSIS — S59912A Unspecified injury of left forearm, initial encounter: Secondary | ICD-10-CM | POA: Diagnosis present

## 2019-03-07 DIAGNOSIS — Y9389 Activity, other specified: Secondary | ICD-10-CM | POA: Insufficient documentation

## 2019-03-07 DIAGNOSIS — I10 Essential (primary) hypertension: Secondary | ICD-10-CM | POA: Diagnosis not present

## 2019-03-07 DIAGNOSIS — W19XXXA Unspecified fall, initial encounter: Secondary | ICD-10-CM | POA: Diagnosis not present

## 2019-03-07 MED ORDER — HYDROMORPHONE HCL 2 MG PO TABS: 2.0000 mg | ORAL_TABLET | Freq: Four times a day (QID) | ORAL | 0 refills | Status: DC | PRN

## 2019-03-07 MED ORDER — HYDROMORPHONE HCL 1 MG/ML IJ SOLN
0.5000 mg | Freq: Once | INTRAMUSCULAR | Status: AC
  Administered 2019-03-07: 0.5 mg via INTRAVENOUS
  Filled 2019-03-07: qty 1

## 2019-03-07 NOTE — ED Provider Notes (Signed)
[facility] DEPT Provider Note   CSN: [id number] Arrival date & time: 03/07/19  3419    History   Chief Complaint Chief Complaint  Patient presents with   Fall   Arm Injury    HPI Stephanie Barnes is a 68 y.o. female.     HPI Patient presented to the emergency room for evaluation of [place] and wrist injury.  Patient was on a ladder trying to clean out her daughter.  She fell landing on her left side.  Patient has significant pain and swelling in her left shoulder.  She is also having pain in her left wrist.  She is unable to move her arm.  Patient denies any head injury or loss of consciousness.  She has not had any pain in her lower extremities.  She was able to walk after the fall.  Patient was transported by EMS.  She was given pain medications. 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   Welcome to Medicare preventive visit 10/02/2015    Patient Active Problem List   Diagnosis Date Noted   Vitamin D deficiency 12/07/2018   Cervical cancer screening 12/07/2018   Dense breast tissue on mammogram 04/13/2018   Chronic shoulder pain (Location of Primary Source of Pain) (Right) 09/04/2016   Chronic upper extremity pain (Right) 09/04/2016   Chronic upper arm pain (Right) 09/04/2016   Chronic hand pain (Right) 09/04/2016   Neuropathic pain of shoulder (Right) 09/04/2016   Spondylosis of cervical spine (C5-6 and C6-7) 09/04/2016   Cervical DDD (degenerative disc disease) (C5-6 and C6-7) 09/04/2016   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   Abnormal MRI, cervical spine (07/23/2015) 09/04/2016   Abnormal MRI, shoulder (08/06/2015) 09/04/2016   Subdeltoid bursitis of right shoulder joint 09/04/2016   Subacromial bursitis of right shoulder joint 09/04/2016   Labral tear of shoulder, right, sequela 09/04/2016   Synovitis involving glenohumeral joint, right 09/04/2016   Biceps tendinopathy, right 09/04/2016   Infraspinatus tendon tear, right, sequela 09/04/2016   Supraspinatus tendon tear, right, sequela 09/04/2016   Multiple allergies 03/31/2016   Anemia 01/13/2016   Complex regional pain syndrome type I (Location of Primary Source of Pain) (upper extremity) (Right) 01/01/2016   Preventative health care 10/02/2015   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   Cervical disc disorder with radiculopathy of cervical region 07/17/2015   Colon polyp   tubular adenoma  03/2014 03/22/2014   Hyperlipidemia 06/13/2013   Osteopenia 06/13/2013   Insomnia 06/09/2013   Depression with anxiety 12/02/2012   DJD (degenerative joint disease) 12/02/2012   GERD (gastroesophageal reflux disease) 12/02/2012   Eczema 12/02/2012   Tendonitis of right hand 12/02/2012    Past Surgical History:  Procedure Laterality Date   DENTAL SURGERY     Root cleaning    MOUTH SURGERY  08/10/2017   front lower   REFRACTIVE SURGERY Bilateral 2000   ROTATOR CUFF REPAIR Right 2017   TONSILLECTOMY       OB History    Gravida  2   Para  Term      Preterm      AB  2   Living        SAB      TAB      Ectopic      Multiple      Live Births               Home Medications    Prior to Admission medications   Medication Sig Start Date End Date Taking? Authorizing Provider  Ascorbic Acid (VITAMIN C) 1000 MG tablet Take 1,000 mg by mouth daily.   Yes [provider]  buPROPion (WELLBUTRIN) 75 MG tablet TAKE 1/2 (ONE-HALF) TABLET BY MOUTH ONCE DAILY Patient taking differently: Take 37.5 mg by  mouth daily. TAKE 1/2 (ONE-HALF) TABLET BY MOUTH ONCE DAILY 04/08/18  Yes Mosie Lukes, MD  Calcium Carbonate-Vit D-Min (CALCIUM 1200 PO) Take 1 tablet by mouth 2 (two) times daily.   Yes [provider]  cetirizine (ZYRTEC) 10 MG tablet Take 10 mg by mouth daily as needed for allergies.    Yes [provider]  clonazePAM (KLONOPIN) 1 MG tablet Take 1 tablet (1 mg total) by mouth at bedtime. 01/04/19  Yes Mosie Lukes, MD  Cyanocobalamin (VITAMIN B 12 PO) Take 1,000 mg by mouth daily.    Yes [provider]  ECHINACEA PO Take 1,200 mg by mouth 2 (two) times daily.   Yes [provider]  Omega-3 Fatty Acids (SUPER OMEGA-3 PO) Take 1,200 mg by mouth 2 (two) times daily.   Yes [provider]  OVER THE COUNTER MEDICATION Apply 1 application topically as needed (pain). Hemp lotion   Yes [provider]  OVER THE COUNTER MEDICATION Take 500 mg by mouth daily as needed (pain). CBD oil   Yes [provider]  polyvinyl alcohol (LIQUIFILM TEARS) 1.4 % ophthalmic solution Place 1 drop into both eyes as needed for dry eyes.   Yes [provider]  Probiotic Product (PROBIOTIC DAILY PO) Take 1 tablet by mouth daily. NOW 10   Yes [provider]  TURMERIC PO Take 894 mg by mouth 2 (two) times daily. GAIA brand-Take 1 in the morning and 1 at night.   Yes [provider]  HYDROmorphone (DILAUDID) 2 MG tablet Take 1 tablet (2 mg total) by mouth every 6 (six) hours as needed for up to 16 doses for severe pain. 03/07/19   Dorie Rank, MD  mometasone (ELOCON) 0.1 % ointment Apply topically daily. As needed Patient not taking: Reported on 03/07/2019 03/15/15   Mosie Lukes, MD    Family History Family History  Problem Relation Age of Onset   Heart disease Mother    Arthritis Mother    Heart disease Father    COPD Father    Mental illness Brother        bipolar   Hypertension Brother    Suicidality Brother     Alcohol abuse Brother    Heart disease Maternal Aunt    Heart disease Maternal Uncle    Heart disease Paternal Aunt    COPD Paternal Aunt    Heart disease Paternal Uncle    Heart disease Maternal Grandmother    Heart disease Maternal Grandfather    Heart disease Paternal Grandmother    Heart disease Paternal Grandfather     Social History Social History   Tobacco Use   Smoking status: Never Smoker   Smokeless tobacco: Never Used  Substance Use Topics  Alcohol use: Yes    Alcohol/week: 1.0 standard drinks    Types: 1 Glasses of wine per week    Comment: per week   Drug use: No     Allergies   Aspirin; Celebrex [celecoxib]; Doxycycline; Prednisone; Amitriptyline; Celexa [citalopram]; Chlorhexidine; Fosamax [alendronate sodium]; Clindamycin/lincomycin; and Other   Review of Systems Review of Systems  All other systems reviewed and are negative.    Physical Exam Updated Vital Signs BP 114/63 (BP Location: Right Arm)    Pulse 77    Temp 97.7 F (36.5 C) (Oral)    Resp 15    SpO2 97%   Physical Exam Vitals signs and nursing note reviewed.  Constitutional:      General: She is not in acute distress.    Appearance: She is well-developed.  HENT:     Head: Normocephalic and atraumatic.     Right Ear: External ear normal.     Left Ear: External ear normal.  Eyes:     General: No scleral icterus.       Right eye: No discharge.        Left eye: No discharge.     Conjunctiva/sclera: Conjunctivae normal.  Neck:     Musculoskeletal: Neck supple.     Trachea: No tracheal deviation.  Cardiovascular:     Rate and Rhythm: Normal rate and regular rhythm.  Pulmonary:     Effort: Pulmonary effort is normal. No respiratory distress.     Breath sounds: Normal breath sounds. No stridor. No wheezing or rales.  Abdominal:     General: Bowel sounds are normal. There is no distension.     Palpations: Abdomen is soft.     Tenderness: There is no abdominal tenderness.  There is no guarding or rebound.  Musculoskeletal:     Right shoulder: Normal.     Left shoulder: She exhibits tenderness, bony tenderness and swelling.     Left elbow: Normal. She exhibits no swelling and no deformity.     Right wrist: Normal.     Left wrist: She exhibits tenderness, bony tenderness and swelling.     Right hip: Normal.     Left hip: Normal.     Right knee: Normal.     Left knee: Normal.     Right ankle: Normal.     Left ankle: Normal.     Cervical back: Normal.     Thoracic back: Normal.     Lumbar back: Normal.     Comments: Bruising swelling of the left shoulder, decreased range of motion, ttp left wrist , pt able to wiggle fingers, sensation intact  Skin:    General: Skin is warm and dry.     Findings: No rash.  Neurological:     Mental Status: She is alert.     Cranial Nerves: No cranial nerve deficit (no facial droop, extraocular movements intact, no slurred speech).     Sensory: No sensory deficit.     Motor: No abnormal muscle tone or seizure activity.     Coordination: Coordination normal.      ED Treatments / Results  Labs (all labs ordered are listed, but only abnormal results are displayed) Labs Reviewed - No data to display  EKG None  Radiology Dg Chest 2 View  Result Date: 03/07/2019 CLINICAL DATA:  Status post fall, left-sided pain EXAM: CHEST - 2 VIEW COMPARISON:  None. FINDINGS: There is no focal parenchymal opacity. There is no pleural effusion or pneumothorax. The heart and mediastinal  contours are unremarkable. There is an acute comminuted left humeral neck fracture. IMPRESSION: No active cardiopulmonary disease. Acute comminuted left humeral neck fracture. Electronically Signed   By: Kathreen Devoid   On: 03/07/2019 12:11   Dg Forearm Left  Result Date: 03/07/2019 CLINICAL DATA:  Status post fall, left arm pain EXAM: LEFT FOREARM - 2 VIEW COMPARISON:  None. FINDINGS: Nondisplaced impacted fracture of the distal radial metaphysis with 10  mm dorsal displacement. Acute nondisplaced fracture of the base of the ulnar styloid process. No other acute fracture or dislocation. IMPRESSION: Nondisplaced impacted fracture of the distal radial metaphysis with 10 mm dorsal displacement. Acute nondisplaced fracture of the base of the ulnar styloid process. Electronically Signed   By: Kathreen Devoid   On: 03/07/2019 12:05   Dg Wrist Complete Left  Result Date: 03/07/2019 CLINICAL DATA:  Left wrist pain EXAM: LEFT WRIST - COMPLETE 3+ VIEW COMPARISON:  None. FINDINGS: Nondisplaced impacted fracture of the distal radial metaphysis with 10 mm dorsal displacement. Acute nondisplaced fracture of the base of the ulnar styloid process. Generalized osteopenia. Mild osteoarthritis of scaphotrapeziotrapezoid joint. Mild osteoarthritis of the first [place] joint. IMPRESSION: Nondisplaced impacted fracture of the distal radial metaphysis with 10 mm dorsal displacement. Acute nondisplaced fracture of the base of the ulnar styloid process. Electronically Signed   By: Kathreen Devoid   On: 03/07/2019 12:03   Ct Shoulder Left Wo Contrast  Result Date: 03/07/2019 CLINICAL DATA:  Fall from ladder. Comminuted left humeral neck fracture. EXAM: CT OF THE UPPER LEFT EXTREMITY WITHOUT CONTRAST TECHNIQUE: Multidetector CT imaging of the left shoulder was performed according to the standard protocol. COMPARISON:  Left shoulder radiographs same date. FINDINGS: Bones/Joint/Cartilage There is a severely comminuted and displaced fracture of the left humeral neck. The fracture extends into both tuberosities and involves the anterior aspect of humeral head articular surface. There is approximately 2.6 cm of anterior displacement into the deltoid muscle as well as significant apex anterior angulation. The humeral head remains located. There is no evidence of glenoid or distal clavicle fracture. There are underlying moderate acromioclavicular degenerative changes. The acromion is type 2. There is a  small shoulder joint effusion. Ligaments Suboptimally assessed by CT. Muscles and Tendons There is soft tissue swelling in the deltoid muscle, greatest anteriorly. Grossly intact rotator cuff. The long head of the biceps tendon is not well visualized, although is likely torn. Soft tissues No large periarticular hematoma demonstrated. There are mild emphysematous changes in the left lung. IMPRESSION: 1. Comminuted and significantly displaced/angulated fracture of the left humeral neck. Fracture involves the anterior aspect of the humeral head articular surface. 2. No dislocation or glenoid fracture. Electronically Signed   By: Richardean Sale M.D.   On: 03/07/2019 15:03   Dg Shoulder Left  Result Date: 03/07/2019 CLINICAL DATA:  Status post fall EXAM: LEFT SHOULDER - 2+ VIEW COMPARISON:  None. FINDINGS: Acute comminuted left humeral neck fracture with 2 cm of anterior displacement. Comminuted fracture of the superolateral humeral head involving the greater tuberosity with peripheral displacement measuring at least 9 mm. No other acute fracture or dislocation. IMPRESSION: Acute comminuted left humeral neck fracture with 2 cm of anterior displacement. Comminuted fracture of the superolateral humeral head involving the greater tuberosity with peripheral displacement measuring at least 9 mm. Electronically Signed   By: Kathreen Devoid   On: 03/07/2019 12:13   Dg Humerus Left  Result Date: 03/07/2019 CLINICAL DATA:  Status post fall from a ladder EXAM: LEFT HUMERUS -  2+ VIEW COMPARISON:  None. FINDINGS: Comminuted fracture of the surgical neck of the left proximal humerus with involvement of the left greater tuberosity which is superolaterally displaced. 14 mm anterior displacement of the proximal shaft. No dislocation. No other fracture. IMPRESSION: Comminuted fracture of the surgical neck of the left proximal humerus with involvement of the left greater tuberosity which is superolaterally displaced. Electronically  Signed   By: Kathreen Devoid   On: 03/07/2019 12:00    Procedures Procedures (including critical care time)  Medications Ordered in ED Medications  HYDROmorphone (DILAUDID) injection 0.5 mg (0.5 mg Intravenous Given 03/07/19 1026)  HYDROmorphone (DILAUDID) injection 0.5 mg (0.5 mg Intravenous Given 03/07/19 1144)  HYDROmorphone (DILAUDID) injection 0.5 mg (0.5 mg Intravenous Given 03/07/19 1357)     Initial Impression / Assessment and Plan / ED Course  I have reviewed the triage vital signs and the nursing notes.  Pertinent labs & imaging results that were available during my care of the patient were reviewed by me and considered in my medical decision making (see chart for details).  Clinical Course as of Mar 06 1522  Mon Mar 07, 2019  1326 Discussed with Dr Alvan Dame.  Pt Will see Dr Stann Mainland on Wednesday.  Shoulder CT in the ED to help facilitate outpatient treatment   [JK]    Clinical Course User Index [JK] Dorie Rank, MD     Patient presented with mechanical fall.  Her x-rays demonstrate a wrist fracture as well as a proximal humerus fracture.  She does have significant displacement in the humerus.  Spoke with orthopedic surgery.  Patient placed in a sling and splint.  She will follow-up in orthopedic office on Wednesday.  Patient takes Dilaudid at home for pain.  I will give her prescription for that to help with her fracture pain.  Final Clinical Impressions(s) / ED Diagnoses   Final diagnoses:  Closed fracture of left wrist, initial encounter  Other closed displaced fracture of proximal end of left humerus, initial encounter    ED Discharge Orders         Ordered    HYDROmorphone (DILAUDID) 2 MG tablet  Every 6 hours PRN     03/07/19 1522           Dorie Rank, MD 03/07/19 1523

## 2019-03-07 NOTE — Discharge Instructions (Signed)
Keep your arm in the sling and splint, follow-up with your orthopedic doctor, take the pain medications as needed, apply ice to help with the swelling

## 2019-03-07 NOTE — ED Triage Notes (Signed)
Patient BIB EMS from home. Patient was on step ladder cleaning gutters when she fell approx 4-6 feet. Patient landed on left side on concrete patio. Patient denies head injury, denies LOC. Patient got up and walked inside. Patient complaining of left arm pain, from shoulder to wrist. Patient reports numbness to left fingers. Left radial pulse palpable and equal, per EMS report. Pt guarding left arm. 175mcg IV Fentanyl administered by EMS to 20g Left AC PIV placed by EMS.   T97.97F,  160/78, HR64, RR20, 98% RA.

## 2019-03-07 NOTE — ED Notes (Signed)
Patient transported to X-ray 

## 2019-03-07 NOTE — ED Notes (Signed)
Bed: WT96 Expected date:  Expected time:  Means of arrival:  Comments: EMS injury

## 2019-03-07 NOTE — ED Notes (Signed)
Patient given discharge teaching and verbalized understanding. Patient was taken out of ED w/ wheelchair by ED staff.

## 2019-03-09 DIAGNOSIS — S52502A Unspecified fracture of the lower end of left radius, initial encounter for closed fracture: Secondary | ICD-10-CM | POA: Diagnosis not present

## 2019-03-09 DIAGNOSIS — S42352A Displaced comminuted fracture of shaft of humerus, left arm, initial encounter for closed fracture: Secondary | ICD-10-CM | POA: Diagnosis not present

## 2019-03-09 DIAGNOSIS — S42212A Unspecified displaced fracture of surgical neck of left humerus, initial encounter for closed fracture: Secondary | ICD-10-CM | POA: Diagnosis not present

## 2019-03-09 NOTE — Pre-Procedure Instructions (Signed)
Stephanie Barnes  03/09/2019      [facility] [address] [place], [place] - [address] [place] [address] Phone: [phone] Fax: [phone]    Your procedure is scheduled on Tues., Mar 15, 2019 from 7:30AM-11:00AM  Report to [facility] Entrance "A" at 5:30AM  Call this number if you have problems the morning of surgery:  [phone]   Remember:  Do not eat after midnight on May 4th  You may drink clear liquids until 3 hours (4:30AM) prior to surgery .  Clear liquids allowed are:  Water, Juice (non-citric and without pulp), Carbonated beverages, Clear Tea, Black Coffee only, Plain Jell-O only, Gatorade and Plain Popsicles only   Please complete your PRE-SURGERY ENSURE that was provided to you by (4:30AM) the morning of surgery.  Please, if able, drink it in one setting. DO NOT SIP.     Take these medicines the morning of surgery with A SIP OF WATER: BuPROPion [facility])    If needed: Cetirizine (ZYRTEC), HYDROmorphone (DILAUDID), and Polyvinyl alcohol (LIQUIFILM TEARS)  As of today, stop taking all Aspirin Products, Vitamins, Fish oils, and Herbal medications. Also stop all NSAIDS i.e. Advil, Ibuprofen, Motrin, Aleve, Anaprox, Naproxen, BC, Goody Powders, and all Supplements.    Special instructions: [facility]- Preparing For Surgery  Before surgery, you can play an important role. Because skin is not sterile, your skin needs to be as free of germs as possible. You can reduce the number of germs on your skin by washing with CHG (chlorahexidine gluconate) Soap before surgery.  CHG is an antiseptic cleaner which kills germs and bonds with the skin to continue killing germs even after washing.    Please do not use if you have an allergy to CHG or antibacterial soaps. If your skin becomes reddened/irritated stop using the CHG.  Do not shave (including legs and underarms) for at least 48 hours prior to first CHG shower. It is  OK to shave your face.  Please follow these instructions carefully.   1. Shower the NIGHT BEFORE SURGERY and the MORNING OF SURGERY with CHG.   2. If you chose to wash your hair, wash your hair first as usual with your normal shampoo.  3. After you shampoo, rinse your hair and body thoroughly to remove the shampoo.  4. Use CHG as you would any other liquid soap. You can apply CHG directly to the skin and wash gently with a scrungie or a clean washcloth.   5. Apply the CHG Soap to your body ONLY FROM THE NECK DOWN.  Do not use on open wounds or open sores. Avoid contact with your eyes, ears, mouth and genitals (private parts). Wash Face and genitals (private parts)  with your normal soap.  6. Wash thoroughly, paying special attention to the area where your surgery will be performed.  7. Thoroughly rinse your body with warm water from the neck down.  8. DO NOT shower/wash with your normal soap after using and rinsing off the CHG Soap.  9. Pat yourself dry with a CLEAN TOWEL.  10. Wear CLEAN PAJAMAS to bed the night before surgery, wear comfortable clothes the morning of surgery  11. Place CLEAN SHEETS on your bed the night of your first shower and DO NOT SLEEP WITH PETS.  Day of Surgery:  Oral Hygiene is also important to reduce your risk of infection.  Remember - BRUSH YOUR TEETH THE MORNING OF SURGERY WITH YOUR  REGULAR TOOTHPASTE              Do not wear jewelry, make-up or nail polish.  Do not wear lotions, powders, or perfumes, or deodorant.  Do not shave 48 hours prior to surgery.    Do not bring valuables to the hospital.             Please wear clean clothes to the hospital/surgery center.  [facility] is not responsible for any belongings or valuables.  Contacts, dentures or bridgework may not be worn into surgery.  Leave your suitcase in the car.  After surgery it may be brought to your room.  For patients admitted to the hospital, discharge time will be determined by  your treatment team.  Patients discharged the day of surgery will not be allowed to drive home.  Please read over the following fact sheets that you were given. Pain Booklet, Coughing and Deep Breathing, MRSA Information and Surgical Site Infection Prevention

## 2019-03-10 ENCOUNTER — Encounter (HOSPITAL_COMMUNITY): Payer: Self-pay

## 2019-03-10 ENCOUNTER — Other Ambulatory Visit: Payer: Self-pay

## 2019-03-10 ENCOUNTER — Encounter (HOSPITAL_COMMUNITY)
Admission: RE | Admit: 2019-03-10 | Discharge: 2019-03-10 | Disposition: A | Discharge status: Home / Self Care | Payer: Medicare HMO | Source: Ambulatory Visit | Attending: Orthopedic Surgery | Admitting: Orthopedic Surgery

## 2019-03-10 DIAGNOSIS — Z01812 Encounter for preprocedural laboratory examination: Secondary | ICD-10-CM | POA: Insufficient documentation

## 2019-03-10 LAB — BASIC METABOLIC PANEL
Anion gap: 14 (ref 5–15)
BUN: 17 mg/dL (ref 8–23)
CO2: 22 mmol/L (ref 22–32)
Calcium: 9.3 mg/dL (ref 8.9–10.3)
Chloride: 100 mmol/L (ref 98–111)
Creatinine, Ser: 0.79 mg/dL (ref 0.44–1.00)
GFR calc Af Amer: >60 mL/min (ref >60)
GFR calc non Af Amer: >60 mL/min (ref >60)
Glucose, Bld: 105 mg/dL — ABNORMAL HIGH (ref 70–99)
Potassium: 4.3 mmol/L (ref 3.5–5.1)
Sodium: 136 mmol/L (ref 135–145)

## 2019-03-10 LAB — SURGICAL PCR SCREEN
MRSA, PCR: NEGATIVE
Staphylococcus aureus: NEGATIVE

## 2019-03-10 LAB — CBC
HCT: 33.1 % — ABNORMAL LOW (ref 36.0–46.0)
Hemoglobin: 11 g/dL — ABNORMAL LOW (ref 12.0–15.0)
MCH: 31.3 pg (ref 26.0–34.0)
MCHC: 33.2 g/dL (ref 30.0–36.0)
MCV: 94.3 fL (ref 80.0–100.0)
Platelets: 174 10*3/uL (ref 150–400)
RBC: 3.51 MIL/uL — ABNORMAL LOW (ref 3.87–5.11)
RDW: 12.1 % (ref 11.5–15.5)
WBC: 8.4 10*3/uL (ref 4.0–10.5)
nRBC: 0 % (ref 0.0–0.2)

## 2019-03-10 NOTE — Progress Notes (Signed)
Spoke with Claiborne Billings at [facility] in regards to having orders for separate consents/surgeons. Per Claiborne Billings, messages will be relayed to both surgeons.

## 2019-03-10 NOTE — Progress Notes (Signed)
PCP - Dr. Willette Alma Cardiologist - denies  Chest x-ray - 03/07/2019 EKG - N/A  Stress Test - 06/09/12 (in media) ECHO - denies Cardiac Cath - denies  Sleep Study - denies CPAP - No  Blood Thinner Instructions: N/A Aspirin Instructions: N/A  Anesthesia review: No  Coronavirus Screening  Have you experienced the following symptoms:  Cough yes/no: No Fever (>100.48F)  yes/no: No Runny nose yes/no: No Sore throat yes/no: No Difficulty breathing/shortness of breath  yes/no: No  Have you or a family member traveled in the last 14 days and where? yes/no: No  If the patient indicates "YES" to the above questions, their PAT will be rescheduled to limit the exposure to others and, the surgeon will be notified. THE PATIENT WILL NEED TO BE ASYMPTOMATIC FOR 14 DAYS.   If the patient is not experiencing any of these symptoms, the PAT nurse will instruct them to NOT bring anyone with them to their appointment since they may have these symptoms or traveled as well.   Please remind your patients and families that hospital visitation restrictions are in effect and the importance of the restrictions.    Patient denies shortness of breath, fever, cough and chest pain at PAT appointment  Patient verbalized understanding of instructions that were given to them at the PAT appointment. Patient was also instructed that they will need to review over the PAT instructions again at home before surgery.

## 2019-03-14 MED ORDER — TRANEXAMIC ACID-NACL 1000-0.7 MG/100ML-% IV SOLN
1000.0000 mg | INTRAVENOUS | Status: AC
  Administered 2019-03-15: 1000 mg via INTRAVENOUS
  Filled 2019-03-14: qty 100

## 2019-03-14 NOTE — Anesthesia Preprocedure Evaluation (Addendum)
Anesthesia Evaluation  Patient identified by MRN, date of birth, ID band Patient awake    Reviewed: Allergy & Precautions, NPO status , Patient's Chart, lab work & pertinent test results  History of Anesthesia Complications Negative for: history of anesthetic complications  Airway Mallampati: II  TM Distance: >3 FB Neck ROM: Full    Dental  (+) Teeth Intact, Dental Advisory Given   Pulmonary neg pulmonary ROS,    Pulmonary exam normal breath sounds clear to auscultation       Cardiovascular negative cardio ROS Normal cardiovascular exam Rhythm:Regular Rate:Normal     Neuro/Psych Anxiety Depression Cervical radiculopathy, RUE CRPS    GI/Hepatic Neg liver ROS, GERD  Medicated and Controlled,  Endo/Other  negative endocrine ROS  Renal/GU negative Renal ROS     Musculoskeletal  (+) Arthritis ,   Abdominal   Peds  Hematology negative hematology ROS (+)   Anesthesia Other Findings Day of surgery medications reviewed with the patient.  Reproductive/Obstetrics                            Anesthesia Physical Anesthesia Plan  ASA: II  Anesthesia Plan: General   Post-op Pain Management: GA combined w/ Regional for post-op pain   Induction: Intravenous  PONV Risk Score and Plan: 3 and Treatment may vary due to age or medical condition, Ondansetron, Dexamethasone and Midazolam  Airway Management Planned: Oral ETT  Additional Equipment: None  Intra-op Plan:   Post-operative Plan: Extubation in OR  Informed Consent: I have reviewed the patients History and Physical, chart, labs and discussed the procedure including the risks, benefits and alternatives for the proposed anesthesia with the patient or authorized representative who has indicated his/her understanding and acceptance.     Dental advisory given  Plan Discussed with: CRNA  Anesthesia Plan Comments:        Anesthesia  Quick Evaluation

## 2019-03-14 NOTE — Progress Notes (Signed)
Denies fever, cough, shob, COVID-19 exposure, or travel since PAT appointment. 

## 2019-03-15 ENCOUNTER — Inpatient Hospital Stay (HOSPITAL_COMMUNITY): Payer: Medicare HMO | Admitting: Anesthesiology

## 2019-03-15 ENCOUNTER — Encounter (HOSPITAL_COMMUNITY): Payer: Self-pay

## 2019-03-15 ENCOUNTER — Inpatient Hospital Stay (HOSPITAL_COMMUNITY): Payer: Medicare HMO

## 2019-03-15 ENCOUNTER — Encounter (HOSPITAL_COMMUNITY)
Admission: RE | Disposition: A | Discharge status: Home / Self Care | Payer: Self-pay | Source: Home / Self Care | Attending: Orthopedic Surgery

## 2019-03-15 ENCOUNTER — Inpatient Hospital Stay (HOSPITAL_COMMUNITY)
Admission: RE | Admit: 2019-03-15 | Discharge: 2019-03-17 | DRG: 483 | Disposition: A | Discharge status: Home / Self Care | Payer: Medicare HMO | Attending: Orthopedic Surgery | Admitting: Orthopedic Surgery

## 2019-03-15 ENCOUNTER — Other Ambulatory Visit: Payer: Self-pay

## 2019-03-15 DIAGNOSIS — Z886 Allergy status to analgesic agent status: Secondary | ICD-10-CM | POA: Diagnosis not present

## 2019-03-15 DIAGNOSIS — Z96612 Presence of left artificial shoulder joint: Secondary | ICD-10-CM | POA: Diagnosis not present

## 2019-03-15 DIAGNOSIS — W19XXXA Unspecified fall, initial encounter: Secondary | ICD-10-CM | POA: Diagnosis present

## 2019-03-15 DIAGNOSIS — Z818 Family history of other mental and behavioral disorders: Secondary | ICD-10-CM | POA: Diagnosis not present

## 2019-03-15 DIAGNOSIS — Z888 Allergy status to other drugs, medicaments and biological substances status: Secondary | ICD-10-CM | POA: Diagnosis not present

## 2019-03-15 DIAGNOSIS — S52502A Unspecified fracture of the lower end of left radius, initial encounter for closed fracture: Secondary | ICD-10-CM | POA: Diagnosis not present

## 2019-03-15 DIAGNOSIS — M1990 Unspecified osteoarthritis, unspecified site: Secondary | ICD-10-CM | POA: Diagnosis not present

## 2019-03-15 DIAGNOSIS — S42202A Unspecified fracture of upper end of left humerus, initial encounter for closed fracture: Secondary | ICD-10-CM | POA: Diagnosis not present

## 2019-03-15 DIAGNOSIS — G8918 Other acute postprocedural pain: Secondary | ICD-10-CM | POA: Diagnosis not present

## 2019-03-15 DIAGNOSIS — Z91048 Other nonmedicinal substance allergy status: Secondary | ICD-10-CM

## 2019-03-15 DIAGNOSIS — Z471 Aftercare following joint replacement surgery: Secondary | ICD-10-CM | POA: Diagnosis not present

## 2019-03-15 DIAGNOSIS — F329 Major depressive disorder, single episode, unspecified: Secondary | ICD-10-CM | POA: Diagnosis present

## 2019-03-15 DIAGNOSIS — F419 Anxiety disorder, unspecified: Secondary | ICD-10-CM | POA: Diagnosis present

## 2019-03-15 DIAGNOSIS — S42292A Other displaced fracture of upper end of left humerus, initial encounter for closed fracture: Secondary | ICD-10-CM | POA: Diagnosis not present

## 2019-03-15 DIAGNOSIS — S42232A 3-part fracture of surgical neck of left humerus, initial encounter for closed fracture: Secondary | ICD-10-CM | POA: Diagnosis not present

## 2019-03-15 DIAGNOSIS — Z79899 Other long term (current) drug therapy: Secondary | ICD-10-CM | POA: Diagnosis not present

## 2019-03-15 DIAGNOSIS — Z87892 Personal history of anaphylaxis: Secondary | ICD-10-CM | POA: Diagnosis not present

## 2019-03-15 DIAGNOSIS — S52572A Other intraarticular fracture of lower end of left radius, initial encounter for closed fracture: Secondary | ICD-10-CM | POA: Diagnosis not present

## 2019-03-15 DIAGNOSIS — R69 Illness, unspecified: Secondary | ICD-10-CM | POA: Diagnosis not present

## 2019-03-15 DIAGNOSIS — Z8261 Family history of arthritis: Secondary | ICD-10-CM | POA: Diagnosis not present

## 2019-03-15 HISTORY — DX: Unspecified fracture of the lower end of left radius, initial encounter for closed fracture: S52.502A

## 2019-03-15 HISTORY — PX: OPEN REDUCTION INTERNAL FIXATION (ORIF) DISTAL RADIAL FRACTURE: SHX5989

## 2019-03-15 HISTORY — DX: Unspecified fracture of upper end of unspecified humerus, initial encounter for closed fracture: S42.209A

## 2019-03-15 HISTORY — PX: REVERSE SHOULDER ARTHROPLASTY: SHX5054

## 2019-03-15 SURGERY — ARTHROPLASTY, SHOULDER, TOTAL, REVERSE
Anesthesia: General | Site: Wrist | Laterality: Left

## 2019-03-15 MED ORDER — SODIUM CHLORIDE 0.9 % IV SOLN
INTRAVENOUS | Status: DC | PRN
  Administered 2019-03-15: 08:00:00 30 ug/min via INTRAVENOUS

## 2019-03-15 MED ORDER — 0.9 % SODIUM CHLORIDE (POUR BTL) OPTIME
TOPICAL | Status: DC | PRN
  Administered 2019-03-15 (×2): 1000 mL

## 2019-03-15 MED ORDER — METOCLOPRAMIDE HCL 5 MG/ML IJ SOLN: 5.0000 mg | Freq: Three times a day (TID) | INTRAMUSCULAR | Status: DC | PRN

## 2019-03-15 MED ORDER — FENTANYL CITRATE (PF) 100 MCG/2ML IJ SOLN
INTRAMUSCULAR | Status: AC
  Filled 2019-03-15: qty 2

## 2019-03-15 MED ORDER — MIDAZOLAM HCL 5 MG/5ML IJ SOLN
INTRAMUSCULAR | Status: DC | PRN
  Administered 2019-03-15 (×2): 1 mg via INTRAVENOUS

## 2019-03-15 MED ORDER — KETOROLAC TROMETHAMINE 30 MG/ML IJ SOLN
30.0000 mg | Freq: Once | INTRAMUSCULAR | Status: AC
  Administered 2019-03-15: 12:00:00 30 mg via INTRAVENOUS

## 2019-03-15 MED ORDER — METHOCARBAMOL 1000 MG/10ML IJ SOLN
500.0000 mg | Freq: Four times a day (QID) | INTRAVENOUS | Status: DC | PRN
  Filled 2019-03-15: qty 5

## 2019-03-15 MED ORDER — FENTANYL CITRATE (PF) 100 MCG/2ML IJ SOLN
25.0000 ug | INTRAMUSCULAR | Status: DC | PRN
  Administered 2019-03-15: 50 ug via INTRAVENOUS
  Administered 2019-03-15 (×2): 25 ug via INTRAVENOUS

## 2019-03-15 MED ORDER — KETOROLAC TROMETHAMINE 30 MG/ML IJ SOLN
INTRAMUSCULAR | Status: AC
  Filled 2019-03-15: qty 1

## 2019-03-15 MED ORDER — BUPROPION HCL 75 MG PO TABS
37.5000 mg | ORAL_TABLET | Freq: Every day | ORAL | Status: DC
  Administered 2019-03-16 – 2019-03-17 (×2): 37.5 mg via ORAL
  Filled 2019-03-15 (×2): qty 1

## 2019-03-15 MED ORDER — ACETAMINOPHEN 325 MG PO TABS: 325.0000 mg | ORAL_TABLET | Freq: Four times a day (QID) | ORAL | Status: DC | PRN

## 2019-03-15 MED ORDER — SUCCINYLCHOLINE CHLORIDE 200 MG/10ML IV SOSY
PREFILLED_SYRINGE | INTRAVENOUS | Status: AC
  Filled 2019-03-15: qty 10

## 2019-03-15 MED ORDER — OXYCODONE HCL 5 MG PO TABS: 5.0000 mg | ORAL_TABLET | Freq: Once | ORAL | Status: DC | PRN

## 2019-03-15 MED ORDER — CLONAZEPAM 1 MG PO TABS
1.0000 mg | ORAL_TABLET | Freq: Every day | ORAL | Status: DC
  Administered 2019-03-15 – 2019-03-16 (×2): 1 mg via ORAL
  Filled 2019-03-15 (×2): qty 1

## 2019-03-15 MED ORDER — ONDANSETRON HCL 4 MG/2ML IJ SOLN: 4.0000 mg | Freq: Four times a day (QID) | INTRAMUSCULAR | Status: DC | PRN

## 2019-03-15 MED ORDER — SUGAMMADEX SODIUM 200 MG/2ML IV SOLN
INTRAVENOUS | Status: DC | PRN
  Administered 2019-03-15: 120 mg via INTRAVENOUS

## 2019-03-15 MED ORDER — FENTANYL CITRATE (PF) 250 MCG/5ML IJ SOLN
INTRAMUSCULAR | Status: AC
  Filled 2019-03-15: qty 5

## 2019-03-15 MED ORDER — LORATADINE 10 MG PO TABS
10.0000 mg | ORAL_TABLET | Freq: Every day | ORAL | Status: DC
  Administered 2019-03-17: 10 mg via ORAL
  Filled 2019-03-15 (×2): qty 1

## 2019-03-15 MED ORDER — SUCCINYLCHOLINE CHLORIDE 200 MG/10ML IV SOSY
PREFILLED_SYRINGE | INTRAVENOUS | Status: DC | PRN
  Administered 2019-03-15: 100 mg via INTRAVENOUS

## 2019-03-15 MED ORDER — PROPOFOL 10 MG/ML IV BOLUS
INTRAVENOUS | Status: DC | PRN
  Administered 2019-03-15: 150 mg via INTRAVENOUS

## 2019-03-15 MED ORDER — HYDROMORPHONE HCL 1 MG/ML IJ SOLN
0.5000 mg | INTRAMUSCULAR | Status: DC | PRN
  Administered 2019-03-15: 0.5 mg via INTRAVENOUS
  Administered 2019-03-15 – 2019-03-16 (×5): 1 mg via INTRAVENOUS
  Filled 2019-03-15 (×6): qty 1

## 2019-03-15 MED ORDER — LIDOCAINE 2% (20 MG/ML) 5 ML SYRINGE
INTRAMUSCULAR | Status: AC
  Filled 2019-03-15: qty 5

## 2019-03-15 MED ORDER — GABAPENTIN 300 MG PO CAPS
300.0000 mg | ORAL_CAPSULE | Freq: Three times a day (TID) | ORAL | Status: DC
  Administered 2019-03-15 – 2019-03-17 (×6): 300 mg via ORAL
  Filled 2019-03-15: qty 3
  Filled 2019-03-15 (×5): qty 1

## 2019-03-15 MED ORDER — BUPIVACAINE-EPINEPHRINE (PF) 0.25% -1:200000 IJ SOLN
INTRAMUSCULAR | Status: AC
  Filled 2019-03-15: qty 30

## 2019-03-15 MED ORDER — ACETAMINOPHEN 10 MG/ML IV SOLN
INTRAVENOUS | Status: AC
  Filled 2019-03-15: qty 100

## 2019-03-15 MED ORDER — BUPIVACAINE LIPOSOME 1.3 % IJ SUSP
INTRAMUSCULAR | Status: DC | PRN
  Administered 2019-03-15: 10 mL via PERINEURAL

## 2019-03-15 MED ORDER — MIDAZOLAM HCL 2 MG/2ML IJ SOLN
INTRAMUSCULAR | Status: AC
  Filled 2019-03-15: qty 2

## 2019-03-15 MED ORDER — METHOCARBAMOL 500 MG PO TABS
500.0000 mg | ORAL_TABLET | Freq: Four times a day (QID) | ORAL | Status: DC | PRN
  Administered 2019-03-15 – 2019-03-17 (×3): 500 mg via ORAL
  Filled 2019-03-15 (×2): qty 1

## 2019-03-15 MED ORDER — FENTANYL CITRATE (PF) 100 MCG/2ML IJ SOLN
INTRAMUSCULAR | Status: DC | PRN
  Administered 2019-03-15 (×2): 25 ug via INTRAVENOUS
  Administered 2019-03-15: 50 ug via INTRAVENOUS
  Administered 2019-03-15 (×4): 25 ug via INTRAVENOUS
  Administered 2019-03-15: 50 ug via INTRAVENOUS

## 2019-03-15 MED ORDER — CEFAZOLIN SODIUM-DEXTROSE 2-4 GM/100ML-% IV SOLN
INTRAVENOUS | Status: AC
  Filled 2019-03-15: qty 100

## 2019-03-15 MED ORDER — PROPOFOL 10 MG/ML IV BOLUS
INTRAVENOUS | Status: AC
  Filled 2019-03-15: qty 40

## 2019-03-15 MED ORDER — MENTHOL 3 MG MT LOZG: 1.0000 | LOZENGE | OROMUCOSAL | Status: DC | PRN

## 2019-03-15 MED ORDER — BUPIVACAINE-EPINEPHRINE (PF) 0.5% -1:200000 IJ SOLN
INTRAMUSCULAR | Status: DC | PRN
  Administered 2019-03-15: 10 mL via PERINEURAL

## 2019-03-15 MED ORDER — PROMETHAZINE HCL 25 MG/ML IJ SOLN: 6.2500 mg | INTRAMUSCULAR | Status: DC | PRN

## 2019-03-15 MED ORDER — ONDANSETRON HCL 4 MG PO TABS: 4.0000 mg | ORAL_TABLET | Freq: Four times a day (QID) | ORAL | Status: DC | PRN

## 2019-03-15 MED ORDER — METOCLOPRAMIDE HCL 5 MG PO TABS: 5.0000 mg | ORAL_TABLET | Freq: Three times a day (TID) | ORAL | Status: DC | PRN

## 2019-03-15 MED ORDER — PHENOL 1.4 % MT LIQD: 1.0000 | OROMUCOSAL | Status: DC | PRN

## 2019-03-15 MED ORDER — HYDROMORPHONE HCL 1 MG/ML IJ SOLN
INTRAMUSCULAR | Status: AC
  Filled 2019-03-15: qty 1

## 2019-03-15 MED ORDER — ROCURONIUM BROMIDE 10 MG/ML (PF) SYRINGE
PREFILLED_SYRINGE | INTRAVENOUS | Status: AC
  Filled 2019-03-15: qty 10

## 2019-03-15 MED ORDER — OMEGA-3-ACID ETHYL ESTERS 1 G PO CAPS
1.0000 g | ORAL_CAPSULE | Freq: Two times a day (BID) | ORAL | Status: DC
  Administered 2019-03-16 – 2019-03-17 (×3): 1 g via ORAL
  Filled 2019-03-15 (×3): qty 1

## 2019-03-15 MED ORDER — CHLORHEXIDINE GLUCONATE 4 % EX LIQD: 60.0000 mL | Freq: Once | CUTANEOUS | Status: DC

## 2019-03-15 MED ORDER — LACTATED RINGERS IV SOLN
INTRAVENOUS | Status: DC | PRN
  Administered 2019-03-15: 07:00:00 via INTRAVENOUS

## 2019-03-15 MED ORDER — ONDANSETRON HCL 4 MG/2ML IJ SOLN
INTRAMUSCULAR | Status: DC | PRN
  Administered 2019-03-15: 4 mg via INTRAVENOUS

## 2019-03-15 MED ORDER — OXYCODONE HCL 5 MG/5ML PO SOLN: 5.0000 mg | Freq: Once | ORAL | Status: DC | PRN

## 2019-03-15 MED ORDER — HYDROMORPHONE HCL 1 MG/ML IJ SOLN
0.2500 mg | INTRAMUSCULAR | Status: DC | PRN
  Administered 2019-03-15 (×4): 0.5 mg via INTRAVENOUS

## 2019-03-15 MED ORDER — HYDROMORPHONE HCL 2 MG PO TABS
2.0000 mg | ORAL_TABLET | ORAL | Status: DC | PRN
  Administered 2019-03-16 (×2): 2 mg via ORAL
  Administered 2019-03-17 (×4): 4 mg via ORAL
  Filled 2019-03-15 (×4): qty 2
  Filled 2019-03-15 (×2): qty 1

## 2019-03-15 MED ORDER — LIDOCAINE 2% (20 MG/ML) 5 ML SYRINGE
INTRAMUSCULAR | Status: DC | PRN
  Administered 2019-03-15: 40 mg via INTRAVENOUS

## 2019-03-15 MED ORDER — SODIUM CHLORIDE 0.9 % IR SOLN
Status: DC | PRN
  Administered 2019-03-15: 3000 mL

## 2019-03-15 MED ORDER — CEFAZOLIN SODIUM-DEXTROSE 2-4 GM/100ML-% IV SOLN
2.0000 g | INTRAVENOUS | Status: AC
  Administered 2019-03-15: 08:00:00 2 g via INTRAVENOUS

## 2019-03-15 MED ORDER — ACETAMINOPHEN 10 MG/ML IV SOLN
1000.0000 mg | Freq: Once | INTRAVENOUS | Status: DC | PRN
  Administered 2019-03-15: 1000 mg via INTRAVENOUS

## 2019-03-15 MED ORDER — CEFAZOLIN SODIUM-DEXTROSE 2-4 GM/100ML-% IV SOLN
2.0000 g | Freq: Four times a day (QID) | INTRAVENOUS | Status: AC
  Administered 2019-03-15 – 2019-03-16 (×3): 2 g via INTRAVENOUS
  Filled 2019-03-15 (×3): qty 100

## 2019-03-15 MED ORDER — OXYCODONE HCL 5 MG PO TABS: 10.0000 mg | ORAL_TABLET | ORAL | Status: DC | PRN

## 2019-03-15 MED ORDER — DOCUSATE SODIUM 100 MG PO CAPS
100.0000 mg | ORAL_CAPSULE | Freq: Two times a day (BID) | ORAL | Status: DC
  Administered 2019-03-16 – 2019-03-17 (×3): 100 mg via ORAL
  Filled 2019-03-15 (×3): qty 1

## 2019-03-15 MED ORDER — METHOCARBAMOL 500 MG PO TABS
ORAL_TABLET | ORAL | Status: AC
  Filled 2019-03-15: qty 1

## 2019-03-15 MED ORDER — GLYCOPYRROLATE PF 0.2 MG/ML IJ SOSY
PREFILLED_SYRINGE | INTRAMUSCULAR | Status: AC
  Filled 2019-03-15: qty 1

## 2019-03-15 MED ORDER — ROCURONIUM BROMIDE 10 MG/ML (PF) SYRINGE
PREFILLED_SYRINGE | INTRAVENOUS | Status: DC | PRN
  Administered 2019-03-15: 30 mg via INTRAVENOUS

## 2019-03-15 MED ORDER — BUPIVACAINE HCL (PF) 0.25 % IJ SOLN
INTRAMUSCULAR | Status: AC
  Filled 2019-03-15: qty 30

## 2019-03-15 MED ORDER — ACETAMINOPHEN 500 MG PO TABS
1000.0000 mg | ORAL_TABLET | Freq: Four times a day (QID) | ORAL | Status: AC
  Administered 2019-03-15 – 2019-03-16 (×4): 1000 mg via ORAL
  Filled 2019-03-15 (×4): qty 2

## 2019-03-15 MED ORDER — ARTIFICIAL TEARS OPHTHALMIC OINT
TOPICAL_OINTMENT | OPHTHALMIC | Status: AC
  Filled 2019-03-15: qty 3.5

## 2019-03-15 MED ORDER — VITAMIN C 500 MG PO TABS
1000.0000 mg | ORAL_TABLET | Freq: Every day | ORAL | Status: DC
  Administered 2019-03-16 – 2019-03-17 (×2): 1000 mg via ORAL
  Filled 2019-03-15 (×2): qty 2

## 2019-03-15 MED ORDER — OXYCODONE HCL 5 MG PO TABS: 5.0000 mg | ORAL_TABLET | ORAL | Status: DC | PRN

## 2019-03-15 MED ORDER — ONDANSETRON HCL 4 MG/2ML IJ SOLN
INTRAMUSCULAR | Status: AC
  Filled 2019-03-15: qty 2

## 2019-03-15 SURGICAL SUPPLY — 106 items
ALCOHOL 70% 16 OZ (MISCELLANEOUS) ×3 IMPLANT
BANDAGE ACE 3X5.8 VEL STRL LF (GAUZE/BANDAGES/DRESSINGS) ×9 IMPLANT
BANDAGE ACE 4X5 VEL STRL LF (GAUZE/BANDAGES/DRESSINGS) ×3 IMPLANT
BANDAGE ELASTIC 3 VELCRO ST LF (GAUZE/BANDAGES/DRESSINGS) ×3 IMPLANT
BASEPLATE AUG MED W-TAPER (Plate) ×3 IMPLANT
BIT DRILL 2.2 SS TIBIAL (BIT) ×3 IMPLANT
BIT DRILL 2.7 W/STOP DISP (BIT) ×3 IMPLANT
BIT DRILL 5/64X5 DISP (BIT) ×3 IMPLANT
BIT DRILL TWIST 2.7 (BIT) ×3 IMPLANT
BNDG ESMARK 4X9 LF (GAUZE/BANDAGES/DRESSINGS) ×3 IMPLANT
BNDG GAUZE ELAST 4 BULKY (GAUZE/BANDAGES/DRESSINGS) ×6 IMPLANT
CLSR STERI-STRIP ANTIMIC 1/2X4 (GAUZE/BANDAGES/DRESSINGS) ×3 IMPLANT
CORDS BIPOLAR (ELECTRODE) ×3 IMPLANT
COVER SURGICAL LIGHT HANDLE (MISCELLANEOUS) ×6 IMPLANT
COVER WAND RF STERILE (DRAPES) ×6 IMPLANT
CUFF TOURNIQUET SINGLE 18IN (TOURNIQUET CUFF) ×3 IMPLANT
DRAPE IMP U-DRAPE 54X76 (DRAPES) ×6 IMPLANT
DRAPE INCISE IOBAN 66X45 STRL (DRAPES) ×3 IMPLANT
DRAPE OEC MINIVIEW 54X84 (DRAPES) ×3 IMPLANT
DRAPE ORTHO SPLIT 77X108 STRL (DRAPES) ×2
DRAPE SURG 17X23 STRL (DRAPES) ×3 IMPLANT
DRAPE SURG ORHT 6 SPLT 77X108 (DRAPES) ×4 IMPLANT
DRAPE U-SHAPE 47X51 STRL (DRAPES) ×6 IMPLANT
DRSG ADAPTIC 3X8 NADH LF (GAUZE/BANDAGES/DRESSINGS) ×3 IMPLANT
DRSG AQUACEL AG ADV 3.5X 6 (GAUZE/BANDAGES/DRESSINGS) ×3 IMPLANT
DURAPREP 26ML APPLICATOR (WOUND CARE) ×3 IMPLANT
DVR VOLAR RIM NRW PLATE LEFT (Plate) ×3 IMPLANT
ELECT BLADE 4.0 EZ CLEAN MEGAD (MISCELLANEOUS) ×3
ELECT REM PT RETURN 9FT ADLT (ELECTROSURGICAL) ×3
ELECTRODE BLDE 4.0 EZ CLN MEGD (MISCELLANEOUS) ×2 IMPLANT
ELECTRODE REM PT RTRN 9FT ADLT (ELECTROSURGICAL) ×2 IMPLANT
GAUZE SPONGE 4X4 12PLY STRL (GAUZE/BANDAGES/DRESSINGS) ×3 IMPLANT
GAUZE XEROFORM 5X9 LF (GAUZE/BANDAGES/DRESSINGS) ×3 IMPLANT
GLENOID SPHERE 36MM CVD +3 (Orthopedic Implant) ×3 IMPLANT
GLOVE BIO SURGEON STRL SZ7.5 (GLOVE) ×3 IMPLANT
GLOVE BIOGEL M 8.0 STRL (GLOVE) ×3 IMPLANT
GLOVE BIOGEL PI IND STRL 8 (GLOVE) ×2 IMPLANT
GLOVE BIOGEL PI INDICATOR 8 (GLOVE) ×1
GLOVE SS BIOGEL STRL SZ 8 (GLOVE) ×2 IMPLANT
GLOVE SUPERSENSE BIOGEL SZ 8 (GLOVE) ×1
GOWN STRL REUS W/ TWL LRG LVL3 (GOWN DISPOSABLE) ×8 IMPLANT
GOWN STRL REUS W/ TWL XL LVL3 (GOWN DISPOSABLE) ×8 IMPLANT
GOWN STRL REUS W/TWL LRG LVL3 (GOWN DISPOSABLE) ×4
GOWN STRL REUS W/TWL XL LVL3 (GOWN DISPOSABLE) ×4
KIT BASIN OR (CUSTOM PROCEDURE TRAY) ×6 IMPLANT
KIT TURNOVER KIT B (KITS) ×6 IMPLANT
MANIFOLD NEPTUNE II (INSTRUMENTS) ×6 IMPLANT
NEEDLE 1/2 CIR MAYO (NEEDLE) ×3 IMPLANT
NEEDLE 22X1 1/2 (OR ONLY) (NEEDLE) IMPLANT
NEEDLE HYPO 25GX1X1/2 BEV (NEEDLE) ×3 IMPLANT
NS IRRIG 1000ML POUR BTL (IV SOLUTION) ×6 IMPLANT
PACK ORTHO EXTREMITY (CUSTOM PROCEDURE TRAY) ×3 IMPLANT
PACK SHOULDER (CUSTOM PROCEDURE TRAY) ×3 IMPLANT
PAD ARMBOARD 7.5X6 YLW CONV (MISCELLANEOUS) ×12 IMPLANT
PAD CAST 4YDX4 CTTN HI CHSV (CAST SUPPLIES) ×6 IMPLANT
PADDING CAST COTTON 4X4 STRL (CAST SUPPLIES) ×3
PEG LOCKING SMOOTH 2.2X16 (Screw) ×3 IMPLANT
PEG LOCKING SMOOTH 2.2X18 (Peg) ×9 IMPLANT
PEG LOCKING SMOOTH 2.2X20 (Screw) ×6 IMPLANT
PIN THREADED REVERSE (PIN) ×3 IMPLANT
PLATE VOLAR RIM NRRW DVR LEFT (Plate) ×2 IMPLANT
REAMER GUIDE BUSHING SURG DISP (MISCELLANEOUS) ×3 IMPLANT
REAMER GUIDE W/SCREW AUG (MISCELLANEOUS) ×3 IMPLANT
RESTRAINT HEAD UNIVERSAL NS (MISCELLANEOUS) ×3 IMPLANT
SCREW BONE LOCKING 4.75X30X3.5 (Screw) ×3 IMPLANT
SCREW BONE STRL 6.5MMX25MM (Screw) ×3 IMPLANT
SCREW CENTRAL 6.5X20MM (Screw) ×3 IMPLANT
SCREW LOCK 12X2.7X 3 LD (Screw) ×8 IMPLANT
SCREW LOCKING 2.7X11MM (Screw) ×3 IMPLANT
SCREW LOCKING 2.7X12MM (Screw) ×4 IMPLANT
SCREW LOCKING 4.75MMX15MM (Screw) ×6 IMPLANT
SCREW LOCKING NS 4.75MMX20MM (Screw) ×3 IMPLANT
SCRUB BETADINE 4OZ XXX (MISCELLANEOUS) ×6 IMPLANT
SLING ARM IMMOBILIZER LRG (SOFTGOODS) IMPLANT
SLING ARM IMMOBILIZER MED (SOFTGOODS) IMPLANT
SOL PREP POV-IOD 4OZ 10% (MISCELLANEOUS) ×6 IMPLANT
SPONGE LAP 18X18 RF (DISPOSABLE) ×3 IMPLANT
SPONGE LAP 4X18 RFD (DISPOSABLE) ×3 IMPLANT
STEM HUMERAL STRL 10MMX55MM (Stem) ×3 IMPLANT
STRIP CLOSURE SKIN 1/2X4 (GAUZE/BANDAGES/DRESSINGS) ×3 IMPLANT
SUCTION FRAZIER HANDLE 10FR (MISCELLANEOUS) ×1
SUCTION TUBE FRAZIER 10FR DISP (MISCELLANEOUS) ×2 IMPLANT
SUT BROADBAND TAPE 2PK 1.5 (SUTURE) ×6 IMPLANT
SUT FIBERWIRE #2 38 T-5 BLUE (SUTURE) ×3
SUT MAXBRAID (SUTURE) ×3 IMPLANT
SUT MNCRL AB 4-0 PS2 18 (SUTURE) ×6 IMPLANT
SUT MON AB 3-0 SH 27 (SUTURE) ×1
SUT MON AB 3-0 SH27 (SUTURE) ×2 IMPLANT
SUT PROLENE 3 0 PS 2 (SUTURE) IMPLANT
SUT PROLENE 4 0 PS 2 18 (SUTURE) ×3 IMPLANT
SUT VIC AB 1 CT1 27 (SUTURE)
SUT VIC AB 1 CT1 27XBRD ANBCTR (SUTURE) IMPLANT
SUT VIC AB 2-0 CT1 27 (SUTURE) ×2
SUT VIC AB 2-0 CT1 TAPERPNT 27 (SUTURE) ×4 IMPLANT
SUT VIC AB 3-0 FS2 27 (SUTURE) ×6 IMPLANT
SUTURE FIBERWR #2 38 T-5 BLUE (SUTURE) ×2 IMPLANT
SYR CONTROL 10ML LL (SYRINGE) ×3 IMPLANT
SYSTEM CHEST DRAIN TLS 7FR (DRAIN) ×3 IMPLANT
TOWEL OR 17X24 6PK STRL BLUE (TOWEL DISPOSABLE) ×6 IMPLANT
TOWEL OR 17X26 10 PK STRL BLUE (TOWEL DISPOSABLE) ×6 IMPLANT
TRAY HUM REV SHOULDER 36 +3 (Shoulder) ×3 IMPLANT
TRAY HUM REV SHOULDER STD +6 (Shoulder) ×3 IMPLANT
TUBE CONNECTING 12X1/4 (SUCTIONS) ×6 IMPLANT
UNDERPAD 30X30 (UNDERPADS AND DIAPERS) ×3 IMPLANT
WATER STERILE IRR 1000ML POUR (IV SOLUTION) ×6 IMPLANT
YANKAUER SUCT BULB TIP NO VENT (SUCTIONS) ×3 IMPLANT

## 2019-03-15 NOTE — Anesthesia Procedure Notes (Signed)
Anesthesia Regional Block: Interscalene brachial plexus block   Pre-Anesthetic Checklist: ,, timeout performed, Correct Patient, Correct Site, Correct Laterality, Correct Procedure, Correct Position, site marked, Risks and benefits discussed, pre-op evaluation,  At surgeon's request and post-op pain management  Laterality: Left  Prep: Maximum Sterile Barrier Precautions used, chloraprep       Needles:  Injection technique: Single-shot  Needle Type: Echogenic Stimulator Needle     Needle Length: 4cm  Needle Gauge: 22     Additional Needles:   Procedures:,,,, ultrasound used (permanent image in chart),,,,  Narrative:  Start time: 03/15/2019 7:08 AM End time: 03/15/2019 7:10 AM Injection made incrementally with aspirations every 5 mL.  Performed by: Personally  Anesthesiologist: Brennan Bailey, MD  Additional Notes: Risks, benefits, and alternative discussed. Patient gave consent for procedure. Patient prepped and draped in sterile fashion. Sedation administered, patient remains easily responsive to voice. Relevant anatomy identified with ultrasound guidance. Local anesthetic given in 5cc increments with no signs or symptoms of intravascular injection. No pain or paraesthesias with injection. Patient monitored throughout procedure with signs of LAST or immediate complications. Tolerated well. Ultrasound image placed in chart.  Tawny Asal, MD

## 2019-03-15 NOTE — Progress Notes (Signed)
1330 Received pt from PACU, A&O x4. Left hand is numb, cannot move fingers yet, warm. Left shoulder dressing and ace wrap to left arm dry and intact. Red top tube in place.

## 2019-03-15 NOTE — H&P (Signed)
ORTHOPAEDIC [facility] and P  REQUESTING PHYSICIAN: Nicholes Stairs, MD  PCP:  Mosie Lukes, MD  Chief Complaint:  Fall onto left arm  HPI: Stephanie Barnes is a 68 y.o. female who complains of left shoulder and left wrist pain following a fall last week on the 27th.  She was noted to have a left proximal humerus fracture with head splitting component as well left distal radius fracture on the left.  She has become left-hand-dominant due to history of right arm complex regional pain syndrome.  She is here today for surgical management of both the shoulder and wrist.  She has no new complaints today apart from her above complaints.  She denies any numbness or tingling at this time in the left arm.  Past Medical History:  Diagnosis Date  . Abnormal EKG 06/13/2013  . Anemia 01/13/2016  . Anxiety   . Arthritis   . Complex regional pain syndrome 01/01/2016  . Depression   . 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 Distal radius fracture, left   . 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 Medicare annual wellness visit, subsequent 10/02/2015  . Multiple allergies 03/31/2016  . Proximal humerus fracture    Left  . Rotator cuff tear   . 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 Welcome to Medicare preventive visit 10/02/2015   Past Surgical History:  Procedure Laterality Date  . DENTAL SURGERY     Root cleaning   . LASIK    . MOUTH SURGERY  08/10/2017   front lower  . REFRACTIVE SURGERY Bilateral 2000  . ROTATOR CUFF REPAIR Right 2017  . TONSILLECTOMY     Social History   Socioeconomic History  . Marital status: Single    Spouse name: Not on file  . Number of children: Not on file  . Years of education: Not on file  . Highest education level: Not on file  Occupational History  . Not on file  Social Needs  . Financial resource strain: Not on file  . Food insecurity:    Worry: Not on file    Inability: Not on file  . Transportation needs:   Medical: Not on file    Non-medical: Not on file  Tobacco Use  . Smoking status: Never Smoker  . Smokeless tobacco: Never Used  Substance and Sexual Activity  . Alcohol use: Yes    Alcohol/week: 1.0 standard drinks    Types: 1 Glasses of wine per week    Comment: per week  . Drug use: No  . Sexual activity: Not Currently    Comment: lives alone, retired from [employer], no dietary restrictions  Lifestyle  . Physical activity:    Days per week: Not on file    Minutes per session: Not on file  . Stress: Not on file  Relationships  . Social connections:    Talks on phone: Not on file    Gets together: Not on file    Attends religious service: Not on file    Active member of club or organization: Not on file    Attends meetings of clubs or organizations: Not on file    Relationship status: Not on file  Other Topics Concern  . Not on file  Social History Narrative  . Not on file   Family History  Problem Relation Age of Onset  . Heart disease Mother   . Arthritis Mother   . Heart disease  Father   . COPD Father   . Mental illness Brother        bipolar  . Hypertension Brother   . Suicidality Brother   . Alcohol abuse Brother   . Heart disease Maternal Aunt   . Heart disease Maternal Uncle   . Heart disease Paternal Aunt   . COPD Paternal 53   . Heart disease Paternal Uncle   . Heart disease Maternal Grandmother   . Heart disease Maternal Grandfather   . Heart disease Paternal Grandmother   . Heart disease Paternal Grandfather    Allergies  Allergen Reactions  . Aspirin Anaphylaxis and Swelling    Swelling of lips and fingers   . Celebrex [Celecoxib] Anaphylaxis, Hives and Shortness Of Breath  . Doxycycline Other (See Comments)    Scarred throat   . Prednisone Shortness Of Breath and Other (See Comments)    Chest tightness   . Fosamax [Alendronate Sodium] Hives  . Other Hives and Nausea Only    Arthritec  . Amitriptyline     UNSPECIFIED REACTION    . Chlorhexidine     UNSPECIFIED REACTION   . Celexa [Citalopram] Other (See Comments)    Shakiness   . Clindamycin/Lincomycin Nausea And Vomiting  . Tape Itching and Other (See Comments)    UNSPECIFIED REACTION  Paper tape is okay to use per patient   Prior to Admission medications   Medication Sig Start Date End Date Taking? Authorizing Provider  Ascorbic Acid (VITAMIN C) 1000 MG tablet Take 1,000 mg by mouth daily.   Yes [provider]  buPROPion (WELLBUTRIN) 75 MG tablet TAKE 1/2 (ONE-HALF) TABLET BY MOUTH ONCE DAILY Patient taking differently: Take 37.5 mg by mouth daily. TAKE 1/2 (ONE-HALF) TABLET BY MOUTH ONCE DAILY 04/08/18  Yes Mosie Lukes, MD  Calcium Carbonate-Vit D-Min (CALCIUM 1200 PO) Take 1 tablet by mouth 2 (two) times daily.   Yes [provider]  cetirizine (ZYRTEC) 10 MG tablet Take 10 mg by mouth daily as needed for allergies.    Yes [provider]  clonazePAM (KLONOPIN) 1 MG tablet Take 1 tablet (1 mg total) by mouth at bedtime. 01/04/19  Yes Mosie Lukes, MD  Cyanocobalamin (VITAMIN B 12 PO) Take 1,000 mg by mouth daily.    Yes [provider]  ECHINACEA PO Take 1,200 mg by mouth 2 (two) times daily.   Yes [provider]  HYDROmorphone (DILAUDID) 2 MG tablet Take 1 tablet (2 mg total) by mouth every 6 (six) hours as needed for up to 16 doses for severe pain. 03/07/19  Yes Dorie Rank, MD  Omega-3 Fatty Acids (SUPER OMEGA-3 PO) Take 1,200 mg by mouth 2 (two) times daily.   Yes [provider]  OVER THE COUNTER MEDICATION Apply 1 application topically as needed (pain). Hemp lotion   Yes [provider]  OVER THE COUNTER MEDICATION Take 500 mg by mouth daily as needed (pain). CBD oil   Yes [provider]  Probiotic Product (PROBIOTIC DAILY PO) Take 1 tablet by mouth daily. NOW 10   Yes [provider]  TURMERIC PO Take 894 mg by mouth 2 (two) times daily. GAIA brand-Take 1 in the morning  and 1 at night.   Yes [provider]  mometasone (ELOCON) 0.1 % ointment Apply topically daily. As needed Patient not taking: Reported on 03/07/2019 03/15/15   Mosie Lukes, MD  polyvinyl alcohol (LIQUIFILM TEARS) 1.4 % ophthalmic solution Place 1 drop into both  eyes as needed for dry eyes.    [provider]   No results found.  Positive ROS: All other systems have been reviewed and were otherwise negative with the exception of those mentioned in the HPI and as above.  Physical Exam: General: Alert, no acute distress Cardiovascular: No pedal edema Respiratory: No cyanosis, no use of accessory musculature GI: No organomegaly, abdomen is soft and non-tender Skin: No lesions in the area of chief complaint Neurologic: Sensation intact distally Psychiatric: Patient is competent for consent with normal mood and affect Lymphatic: No axillary or cervical lymphadenopathy  MUSCULOSKELETAL:  Left arm is in a short splint at the wrist and sling for the shoulder.  Bruising noted throughout the axilla.  Otherwise she is neurovascularly intact.  No open wounds.  Assessment: 1.  Comminuted and displaced left proximal humerus fracture of 4 parts. 2.  Left distal radius fracture  Plan: -Our plan is to move forward with reverse shoulder arthroplasty for this head splitting and displaced left proximal humerus fracture.  We again discussed the risk, benefits, and indications of this procedure at length.  All questions were solicited and answered to her satisfaction.  These include but are not limited to bleeding, infection, damage to surrounding neurovascular structures, infection, dislocation, periprosthetic fracture, and the risk of developing blood clots.  -Ms. Deviney is also a former patient of my partner Dr. Amedeo Plenty.  He is going to provide definitive fixation of the left distal radius.  They have previously discussed this in the office during her visit with me.  I did solicit any  questions or concerns she may have about moving forward with that procedure.  She has had all questions answered to her satisfaction.  - admit to inpatient post operatively.     Nicholes Stairs, MD Cell [phone]    03/15/2019 7:17 AM

## 2019-03-15 NOTE — Op Note (Signed)
Operative note 03/15/2019  Roseanne Kaufman MD.  Preoperative diagnosis comminuted complex left articular distal radius fracture grade III part  Postop diagnosis: Same  Procedure: #1 open reduction internal fixation greater than 3 part intra-articular distal radius fracture with extended volar rim DVR cross lock plate and screw construct from Biomet #2 AP lateral and oblique x-rays performed examined and interpreted by myself  Surgeon Roseanne Kaufman MD  Anesthesia General  Tourniquet time less than 30 minutes  Drains 1  Operative indications patient presents for evaluation patient has a noted history of severe comminuted shoulder fracture which is undergone surgical reconstruction by Dr. Stann Mainland.  And to address the distal radius fracture at same operative setting.  The patient understands the above-mentioned operative indications risk and benefit profile etc.  Operative procedure.  Patient was taken to the operative theater she underwent a successful reconstructive effort about her left shoulder following this we then performed a pre-scrub with Betadine scrub and paint about the left wrist elbow and forearm followed by a formal 10-minute surgical Betadine scrub and paint.  Preoperative Ancef had previously been given and the patient has sterile field secured.  I was very careful to not hyper abduct or externally rotate the arm.  We placed her somewhat towards her side for the positioning.  At this time inflated the tourniquet time called timeout made a volar radial incision.  Dissection was carried down the carpal canal contents were retracted ulnarly and the FCR tendon was released dorsal and palmarly.  I then incised the pronator and accessed the fracture.  With combination traction and Freer elevator was able to get the intra-articular fragments in good alignment.  I was pleased with this and then applied a volar rim plate.  This was a DVR Biomet volar rim plate distally was performed to allow  for restoration of radial inclination volar tilt and radial height to my satisfaction.  Screw lengths were checked pegs were placed distal screws proximally there were no complicating features the distal radial ulnar joint radiocarpal and midcarpal joints were quite stable.  The patient had irrigation applied followed by closure of the wound after 4 view radiographic series was performed examined and interpreted by myself.  Follow-up quite well and there are no complicating features.  The wound was addressed with additional irrigation after closure of the pronator and a drain was placed followed by closure of the skin edge with Prolene.  Short arm splint was applied.  Drain was placed.  We will remove the drain in less than 24 hours and asked the patient to begin range of motion edema control of the fingers immediately.  All questions have been addressed encouraged and answered.  We will proceed according to standard postop DVR protocol.  Bassy Fetterly MD

## 2019-03-15 NOTE — Op Note (Signed)
Date: 03/15/2019   PRE-OPERATIVE DIAGNOSIS:  1. Left proximal humerus fracture 2.  Left distal radius fracture.   POST-OPERATIVE DIAGNOSIS:  Same   PROCEDURE:  1. REVERSE SHOULDER ARTHROPLASTY 2.  Long head of biceps tenodesis to pectoralis major tendon    SURGEON:  Nicholes Stairs, MD   ASSISTANT: Cameron Sprang, RNFA   ANESTHESIA:   General with a block   ESTIMATED BLOOD LOSS: See anesthesia record   PREOPERATIVE INDICATIONS: Stephanie Barnes is a ambidextrous 68 yo female who sustained a left 3 part proximal humerus fracture with head splitting component, following a fall.  Due to the comminution and displaced nature of the fracture and head splitting components we discussed operative management.  We discussed moving forward with reverse shoulder arthroplasty for his injury to allow earlier weight bearing on a walker and/or cane as needed and lower risk of AVN, non union or progression of shoulder arthritis following the fracture. Thus we elected to proceed with reverse shoulder arthroplasty for the plan.  The risks benefits and alternatives were discussed with the patient preoperatively including but not limited to the risks of infection, bleeding, nerve injury, cardiopulmonary complications, the need for revision surgery, dislocation, brachial plexus palsy, incomplete relief of pain, among others, and the patient was willing to proceed. The patient provided informed consent.  Dr. Amedeo Plenty was consulted for fixation of the distal radius fracture and has discussed his procedure in detail and obtained consent separately.   OPERATIVE IMPLANTS: Biomet size 10 micro press fitted stem with a 15mm +3  liner  And + 6 offset humeral tray, and a 36 mm +3 glenosphere with a 25 mm (mini) baseplate with MEDIUM augment and 4, 4.75 locking screws and one central 6.5 mm nonlocking screw.   OPERATIVE FINDINGS:   Comminuted and displaced three part proximal humerus fracture with head splitting component.     OPERATIVE PROCEDURE: The patient was brought to the operating room and placed in the supine position. General anesthesia was administered. IV antibiotics were given. Time out was performed. The upper extremity was prepped and draped in usual sterile fashion. The patient was in a beachchair position. Deltopectoral approach was carried out. After dissection through skin and subcutaneous fat, the cephalic vein was identified with the deltopectoral interval.  This was mobilized and taken laterally.   The fracture was identified and working through the fracture in the biceps groove the lesser and greater tuberosities were freed up and tagged with #2 Fiber wire sutures.  The humeral head was fragmented and removed from the wound.     Next, the long head of the biceps tendon was tenodesed to the upper border of the pectoralis major tendon with 2 figure of 8 sutures using #2 Fiber wire.   I then performed circumferential releases of the humerus.  I then moved to sizing the humerus.  There was no calcar bone loss and this was used to reference the height of the stem, as was the upper border of the pectoralis major tendon.  The canal was reamed and found to fit best with a 10 mm fracture stem.  However, this proved too tight.  We then moved to trial micro and mini stems which ended up having great press fit with a micro stem size 10.  There was a segmental defect of the posterior humerus, and the micro stem was measured and found to bypass the defect by 3 cm.   We next turned to the glenoid.  Deep retractors were placed, and  I resected the labrum as well as the residual long head of biceps, and then placed a guidepin into the center position on the glenoid, with slight inferior declination. I then reamed over the guidepin, and this created a small metaphyseal cancellus blush inferiorly, removing just the cartilage to the subchondral bone superiorly. We placed a medium augmented superior to account for her 15  degrees of superior inclincation based on preop CT.  The base plate was selected and impacted place, and then I secured it centrally with a nonlocking screw, and I had excellent purchase both inferiorly and superiorly. I placed a short locking screws on anterior aspect,  And posterior aspect.   I then turned my attention to the glenosphere, and impacted this into place, placing slight inferior offset.    The glenoid sphere was completely seated, and had engagement of the [facility] taper. I then turned my attention back to the humerus.    The 10 mm micro stem was seated to the appropriate height to allow approximate 5.6 cm from the top of the Pectoralis major tendon to the top of the glenosphere.  The stem was placed in 30 degrees of retroversion.   Once the stem was impacted into place we trialed poly liners.  Using the +3 humeral stem with + 6 mm offset humeral metaglen,  The shoulder had excellent motion, and was stable.  The final poly was impacted and again showed good motion and stability.  Next, I irrigated the wounds copiously.    The greater tuberosity was brought back to the humeral stem as was the lesser tuberosity to suture tapes that were passed around the stem, prior to impacting.  The rotator interval was closed likewise with #2 Fiberwire.     I then irrigated the shoulder copiously once more, repaired the deltopectoral interval with Vicryl followed by subcutaneous monocryl and then subcuticular monocryl with Steri-Strips and sterile gauze for the skin. The patient was awakened and returned back in stable and satisfactory condition. There no complications and he tolerated the procedure well.  All counts were correct.  The patient was left intubated and repositioned for Dr. Amedeo Plenty to move ahead with her ORIF of the left, ipsilateral, distal radius fracture.  At this point she was in stable condition with no apparent complications.  Please see Dr. Vanetta Shawl operative report for full detail of his  procedure.    Postoperative Plan: Stephanie Barnes will remain in her sling.  She will be non-weightbearing to the left arm.  She may don and doff her sling for daily exercise per OT protocol.  She will be admitted to the Ortho floor for routine postop care.  Ok for elbow ROM and finger ROM as tolerated in wrist splint.

## 2019-03-15 NOTE — Anesthesia Procedure Notes (Signed)
Procedure Name: Intubation Date/Time: 03/15/2019 7:36 AM Performed by: Cleda Daub, CRNA Pre-anesthesia Checklist: Patient identified, Emergency Drugs available, Suction available and Patient being monitored Patient Re-evaluated:Patient Re-evaluated prior to induction Oxygen Delivery Method: Circle system utilized Preoxygenation: Pre-oxygenation with 100% oxygen Induction Type: IV induction and Rapid sequence Laryngoscope Size: Glidescope and 4 Grade View: Grade I Tube type: Oral Tube size: 7.0 mm Number of attempts: 1 Airway Equipment and Method: Stylet and Video-laryngoscopy Placement Confirmation: ETT inserted through vocal cords under direct vision,  positive ETCO2 and breath sounds checked- equal and bilateral Secured at: 19 cm Tube secured with: Tape Dental Injury: Teeth and Oropharynx as per pre-operative assessment

## 2019-03-15 NOTE — Brief Op Note (Signed)
03/15/2019  10:23 AM  PATIENT:  Stephanie Barnes  68 y.o. female  PRE-OPERATIVE DIAGNOSIS:  Left proximal humerus fracture, left distal radius fracture  POST-OPERATIVE DIAGNOSIS:  Left proximal humerus fracture, left distal radius fracture  PROCEDURE:  Procedure(s) with comments: REVERSE SHOULDER ARTHROPLASTY (Left) - 236min OPEN REDUCTION INTERNAL FIXATION (ORIF) DISTAL RADIAL FRACTURE (Left)  SURGEON:  Surgeon(s) and Role: Panel 1:    * Nicholes Stairs, MD - Primary Panel 2:    * Roseanne Kaufman, MD - Primary  PHYSICIAN ASSISTANT:   ASSISTANTS: Cameron Sprang, RNFA   ANESTHESIA:   regional and general  EBL:  75 mL   BLOOD ADMINISTERED:none  DRAINS: none   LOCAL MEDICATIONS USED:  NONE  SPECIMEN:  No Specimen  DISPOSITION OF SPECIMEN:  N/A  COUNTS:  YES  TOURNIQUET:  * Missing tourniquet times found for documented tourniquets in log: [id number] *  DICTATION: .Note written in EPIC  PLAN OF CARE: Admit to inpatient   PATIENT DISPOSITION:  PACU - hemodynamically stable.   Delay start of Pharmacological VTE agent (>24hrs) due to surgical blood loss or risk of bleeding: not applicable

## 2019-03-15 NOTE — Transfer of Care (Signed)
Immediate Anesthesia Transfer of Care Note  Patient: Stephanie Barnes  Procedure(s) Performed: REVERSE SHOULDER ARTHROPLASTY (Left Shoulder) OPEN REDUCTION INTERNAL FIXATION (ORIF) DISTAL RADIAL FRACTURE (Left Wrist)  Patient Location: PACU  Anesthesia Type:GA combined with regional for post-op pain  Level of Consciousness: awake, alert , oriented and patient cooperative  Airway & Oxygen Therapy: Patient Spontanous Breathing and Patient connected to nasal cannula oxygen  Post-op Assessment: Report given to RN and Post -op Vital signs reviewed and stable  Post vital signs: Reviewed and stable  Last Vitals:  Vitals Value Taken Time  BP    Temp    Pulse 93 03/15/2019 11:09 AM  Resp 12 03/15/2019 11:10 AM  SpO2 100 % 03/15/2019 11:09 AM  Vitals shown include unvalidated device data.  Last Pain: There were no vitals filed for this visit.       Complications: No apparent anesthesia complications

## 2019-03-16 ENCOUNTER — Encounter (HOSPITAL_COMMUNITY): Payer: Self-pay | Admitting: Orthopedic Surgery

## 2019-03-16 NOTE — Anesthesia Postprocedure Evaluation (Signed)
Anesthesia Post Note  Patient: Stephanie Barnes  Procedure(s) Performed: REVERSE SHOULDER ARTHROPLASTY (Left Shoulder) OPEN REDUCTION INTERNAL FIXATION (ORIF) DISTAL RADIAL FRACTURE (Left Wrist)     Patient location during evaluation: PACU Anesthesia Type: General Level of consciousness: awake and alert Pain management: pain level controlled Vital Signs Assessment: post-procedure vital signs reviewed and stable Respiratory status: spontaneous breathing, nonlabored ventilation and respiratory function stable Cardiovascular status: blood pressure returned to baseline and stable Postop Assessment: no apparent nausea or vomiting Anesthetic complications: no            Brennan Bailey

## 2019-03-16 NOTE — Evaluation (Signed)
Occupational Therapy Evaluation Patient Details Name: Stephanie Barnes MRN: [id number] DOB: 08-Apr-1951 Today's Date: 03/16/2019    History of Present Illness Pt is a 68 y/o female c/o L shoulder and wrist pain following a fall on 03/07/19. Found with L proximal humerus fx and L distal radius fx. S/P L UE reverse shoulder arthroplasty, ORIF distal radius.  PMH: R UE complex regional pain syndrome, anemia, arthritis, depression, L proximal humerus fx, R rotator cuff sx.    Clinical Impression   Patient admitted for above, limited by decreased functional use of L UE, pain and decreased activity tolerance.  PTA independent and working part time.  Patient educated on sling mgmt and wear schedule, exercises, ADL compensatory techniques, precautions, edema mgmt, and safety.  Patient requires mod assist for UB ADLs, min assist for LB ADLs, setup assist for grooming, and min guard for transfers. Patient reports she has 24/7 support from her cousin at discharge.  Based on performance today, will benefit from continued OT services while admitted but anticipate no further needs after dc (until cleared by surgeon for OP services).  Will follow.     Follow Up Recommendations  Follow surgeon's recommendation for DC plan and follow-up therapies;Supervision/Assistance - 24 hour;No OT follow up    Equipment Recommendations  None recommended by OT    Recommendations for Other Services       Precautions / Restrictions Precautions Precautions: Shoulder Type of Shoulder Precautions: no shoulder ROM, ok for pendulums and scapular retraction, AROM at hand and eblow  Shoulder Interventions: Shoulder sling/immobilizer;At all times Precaution Booklet Issued: Yes (comment) Precaution Comments: wrist ORIF, splinted and no ROM  Required Braces or Orthoses: Sling Restrictions Weight Bearing Restrictions: Yes LUE Weight Bearing: Non weight bearing      Mobility Bed Mobility Overal bed mobility: Needs  Assistance Bed Mobility: Sit to Supine;Supine to Sit     Supine to sit: Min guard Sit to supine: Min guard   General bed mobility comments: min guard for safety   Transfers Overall transfer level: Needs assistance Equipment used: None Transfers: Sit to/from Stand Sit to Stand: Min guard         General transfer comment: min guard for safety and balance     Balance Overall balance assessment: Mild deficits observed, not formally tested                                         ADL either performed or assessed with clinical judgement   ADL Overall ADL's : Needs assistance/impaired     Grooming: Minimal assistance;Sitting   Upper Body Bathing: Moderate assistance;Sitting   Lower Body Bathing: Minimal assistance;Sit to/from stand   Upper Body Dressing : Moderate assistance;Sitting   Lower Body Dressing: Minimal assistance;Sit to/from stand   Toilet Transfer: Min guard;Ambulation   Toileting- Clothing Manipulation and Hygiene: Sit to/from stand;Minimal assistance       Functional mobility during ADLs: Min guard General ADL Comments: pt limited by decreased functional use of L UE      Vision         Perception     Praxis      Pertinent Vitals/Pain Pain Assessment: Faces Faces Pain Scale: Hurts little more Pain Location: L UE  Pain Descriptors / Indicators: Discomfort;Grimacing;Operative site guarding Pain Intervention(s): Monitored during session;Repositioned;Premedicated before session     Hand Dominance Right   Extremity/Trunk Assessment Upper Extremity Assessment  Upper Extremity Assessment: LUE deficits/detail;RUE deficits/detail RUE Deficits / Details: hx of rotator cuff surgery with CPRS, shoulder flexion limited to 100 degrees but functional  LUE Deficits / Details: s/p shoulder arthroplasty (reverse) and ORIF distal radius; limited by pain but able to wiggle fingers with nerve block intact  LUE Sensation: (nerve block intact  ) LUE Coordination: decreased fine motor;decreased gross motor   Lower Extremity Assessment Lower Extremity Assessment: Overall WFL for tasks assessed       Communication Communication Communication: No difficulties   Cognition Arousal/Alertness: Awake/alert Behavior During Therapy: WFL for tasks assessed/performed Overall Cognitive Status: Within Functional Limits for tasks assessed                                     General Comments       Exercises Exercises: Shoulder Shoulder Exercises Elbow Flexion: PROM;Left;10 reps;Supine Elbow Extension: PROM;Left;10 reps;Supine   Shoulder Instructions Shoulder Instructions Donning/doffing shirt without moving shoulder: Moderate assistance Method for sponge bathing under operated UE: Moderate assistance Donning/doffing sling/immobilizer: Moderate assistance Correct positioning of sling/immobilizer: Supervision/safety Pendulum exercises (written home exercise program): (not addressed today) ROM for elbow, wrist and digits of operated UE: Maximal assistance(elbow and hand only) Sling wearing schedule (on at all times/off for ADL's): Independent Proper positioning of operated UE when showering: Supervision/safety Positioning of UE while sleeping: [place] expects to be discharged to:: Private residence Living Arrangements: Alone Available Help at Discharge: Family;Available 24 hours/day Type of Home: House Home Access: Level entry     Home Layout: One level     Bathroom Shower/Tub: Occupational psychologist: Standard     Home Equipment: Shower seat - built in          Prior Functioning/Environment Level of Independence: Independent        Comments: independent and driving, works part time          OT Problem List: Decreased range of motion;Decreased strength;Decreased activity tolerance;Decreased coordination;Decreased safety awareness;Decreased  knowledge of use of DME or AE;Decreased knowledge of precautions;Impaired UE functional use;Increased edema;Pain;Impaired sensation      OT Treatment/Interventions: Self-care/ADL training;Therapeutic exercise;Therapeutic activities;Patient/family education    OT Goals(Current goals can be found in the care plan section) Acute Rehab OT Goals Patient Stated Goal: to get home  OT Goal Formulation: With patient Time For Goal Achievement: 03/30/19 Potential to Achieve Goals: Good  OT Frequency: Min 2X/week   Barriers to D/C:            Co-evaluation              AM-PAC OT "6 Clicks" Daily Activity     Outcome Measure Help from another person eating meals?: A Little Help from another person taking care of personal grooming?: A Little Help from another person toileting, which includes using toliet, bedpan, or urinal?: A Little Help from another person bathing (including washing, rinsing, drying)?: A Lot Help from another person to put on and taking off regular upper body clothing?: A Lot Help from another person to put on and taking off regular lower body clothing?: A Little 6 Click Score: 16   End of Session Equipment Utilized During Treatment: Other (comment)(sling) Nurse Communication: Mobility status  Activity Tolerance: Patient tolerated treatment well Patient left: in bed;with call bell/phone within reach  OT Visit Diagnosis: Other abnormalities of gait and mobility (R26.89);Pain Pain - Right/Left: Left Pain -  part of body: Shoulder;Hand;Arm                Time: 6893-4068 OT Time Calculation (min): 36 min Charges:  OT General Charges $OT Visit: 1 Visit OT Evaluation $OT Eval Moderate Complexity: 1 Mod OT Treatments $Self Care/Home Management : 8-22 mins  Delight Stare, OT Acute Rehabilitation Services Pager [phone] Office [phone]   Delight Stare 03/16/2019, 1:40 PM

## 2019-03-16 NOTE — Progress Notes (Signed)
   Subjective:  Patient reports pain as mild.  She states her pain is much better controlled today versus yesterday.  Her block is resolving.  She has worked with occupational therapy and is already doing her home exercise routine.  She denies chest pain, shortness of breath, nausea or vomiting.  Objective:   VITALS:   Vitals:   03/15/19 1433 03/15/19 1930 03/15/19 2336 03/16/19 0523  BP: 91/64 110/84 96/67 124/62  Pulse: 90 65 91 (!) 101  Resp: 16 15 17 15   Temp:  98.4 F (36.9 C) 98.3 F (36.8 C) 98.4 F (36.9 C)  TempSrc:  Oral Oral Oral  SpO2: 97% 97% 94% 95%  Weight:      Height:        Neurologically intact Neurovascular intact Sensation intact distally Incision: dressing C/D/I and splint intact No cellulitis present Compartment soft TLS drain pulled  Lab Results  Component Value Date   WBC 8.4 03/10/2019   HGB 11.0 (L) 03/10/2019   HCT 33.1 (L) 03/10/2019   MCV 94.3 03/10/2019   PLT 174 03/10/2019   BMET    Component Value Date/Time   NA 136 03/10/2019 1440   K 4.3 03/10/2019 1440   CL 100 03/10/2019 1440   CO2 22 03/10/2019 1440   GLUCOSE 105 (H) 03/10/2019 1440   BUN 17 03/10/2019 1440   CREATININE 0.79 03/10/2019 1440   CREATININE 0.76 07/24/2014 1000   CALCIUM 9.3 03/10/2019 1440   GFRNONAA >60 03/10/2019 1440   GFRAA >60 03/10/2019 1440     Assessment/Plan: 1 Day Post-Op   Active Problems:   Closed fracture of left proximal humerus   Up with therapy - NWB to LUE - sling unless doing exercise - continue conservative protocol for shoulder - elevate left hand as able  - [pain well controlled today - will dc home tomorrow mid day.   Nicholes Stairs 03/16/2019, 12:46 PM   Geralynn Rile, MD [phone]

## 2019-03-17 MED ORDER — HYDROMORPHONE HCL 2 MG PO TABS: 2.0000 mg | ORAL_TABLET | ORAL | 0 refills | Status: DC | PRN

## 2019-03-17 MED ORDER — GABAPENTIN 300 MG PO CAPS: 300.0000 mg | ORAL_CAPSULE | Freq: Three times a day (TID) | ORAL | 0 refills | Status: DC

## 2019-03-17 MED ORDER — ONDANSETRON 4 MG PO TBDP: 4.0000 mg | ORAL_TABLET | Freq: Three times a day (TID) | ORAL | 0 refills | Status: DC | PRN

## 2019-03-17 MED ORDER — METHOCARBAMOL 500 MG PO TABS: 500.0000 mg | ORAL_TABLET | Freq: Four times a day (QID) | ORAL | 0 refills | Status: AC | PRN

## 2019-03-17 MED ORDER — HYDROMORPHONE HCL 2 MG PO TABS: 2.0000 mg | ORAL_TABLET | ORAL | 0 refills | Status: AC | PRN

## 2019-03-17 NOTE — Progress Notes (Signed)
Pt is A&O x4, left hand splint with dressing dry and intact. Left shoulder incision with Aquacel dry and intact. Left arm on a sling, fingers warm and mobile. Discharge instructions given to pt. Discharged to home picked up by family.

## 2019-03-17 NOTE — Plan of Care (Signed)
  Problem: Education: Goal: Knowledge of General Education information will improve Description: Including pain rating scale, medication(s)/side effects and non-pharmacologic comfort measures Outcome: Progressing   Problem: Pain Managment: Goal: General experience of comfort will improve Outcome: Progressing   Problem: Safety: Goal: Ability to remain free from injury will improve Outcome: Progressing   

## 2019-03-17 NOTE — Progress Notes (Signed)
Occupational Therapy Treatment Patient Details Name: Stephanie Barnes MRN: [id number] DOB: [birth year] Today's Date: 03/17/2019    History of present illness Pt is a 68 y/o female c/o L shoulder and wrist pain following a fall on 03/07/19. Found with L proximal humerus fx and L distal radius fx. S/P L UE reverse shoulder arthroplasty, ORIF distal radius.  PMH: R UE complex regional pain syndrome, anemia, arthritis, depression, L proximal humerus fx, R rotator cuff sx.    OT comments  Therapist completed ADL and exercise education today. Pt completed UB/LB bathing and dressing tasks.  She continues to require min-mod assist with ADLs but is independent with directing caregiver assist.  She requires support/assist to left UE during pendulum exercises. Therapist providing gentle support due to weight of left wrist dressing/cast. Pt anticipates discharge home later today, which continues to be an appropriate discharge plan. Will continue to follow acutely while pt is in hospital.   Follow Up Recommendations  Follow surgeon's recommendation for DC plan and follow-up therapies;Supervision/Assistance - 24 hour;No OT follow up    Equipment Recommendations  None recommended by OT    Recommendations for Other Services      Precautions / Restrictions Precautions Precautions: Shoulder Type of Shoulder Precautions: no shoulder ROM, ok for pendulums and scapular retraction, AROM at hand and eblow  Shoulder Interventions: Shoulder sling/immobilizer;At all times;Off for dressing/bathing/exercises Precaution Booklet Issued: Yes (comment) Precaution Comments: wrist ORIF, splinted and no ROM  Required Braces or Orthoses: Sling Restrictions Weight Bearing Restrictions: Yes LUE Weight Bearing: Non weight bearing       Mobility Bed Mobility Overal bed mobility: Needs Assistance Bed Mobility: Sit to Supine;Supine to Sit     Supine to sit: Min guard;HOB elevated Sit to supine: Min guard;HOB elevated    General bed mobility comments: HOB elevated and use of bed rail to assist with transfer.  Transfers Overall transfer level: Needs assistance Equipment used: None Transfers: Sit to/from Stand Sit to Stand: Min guard         General transfer comment: min guard for safety and balance     Balance                                           ADL either performed or assessed with clinical judgement   ADL Overall ADL's : Needs assistance/impaired Eating/Feeding: Set up;Sitting   Grooming: Wash/dry hands;Wash/dry face;Oral care;Brushing hair;Set up;Sitting   Upper Body Bathing: Moderate assistance;Sitting   Lower Body Bathing: Minimal assistance;Sit to/from stand   Upper Body Dressing : Moderate assistance;Sitting   Lower Body Dressing: Moderate assistance;Sit to/from stand   Toilet Transfer: Min guard;Ambulation;Comfort height toilet   Toileting- Clothing Manipulation and Hygiene: Min guard;Sit to/from stand       Functional mobility during ADLs: Min guard General ADL Comments: Pt performed UB/LB bathing and dressing in prep for discharge home. Complete majority of ADLs while seated on 3n1 in bathroom. She demonstrated independence with directing her care appropriately (to simulate how she can direct her cousin at home to assist her). Therapist completed exercise education and educated pt on positioning left UE.      Vision       Perception     Praxis      Cognition Arousal/Alertness: Awake/alert Behavior During Therapy: WFL for tasks assessed/performed Overall Cognitive Status: Within Functional Limits for tasks assessed  Exercises Exercises: Shoulder Shoulder Exercises Pendulum Exercise: PROM;10 reps;Standing Elbow Flexion: AAROM;Left;10 reps;Seated Elbow Extension: AAROM;Left;10 reps;Seated Digit Composite Flexion: AROM;Left;10 reps;Seated Composite Extension: AROM;Left;10  reps;Seated   Shoulder Instructions Shoulder Instructions Donning/doffing shirt without moving shoulder: Moderate assistance;Patient able to independently direct caregiver Method for sponge bathing under operated UE: Moderate assistance;Patient able to independently direct caregiver Donning/doffing sling/immobilizer: Patient able to independently direct caregiver;Minimal assistance Correct positioning of sling/immobilizer: Patient able to independently direct caregiver;Minimal assistance Pendulum exercises (written home exercise program): Minimal assistance ROM for elbow, wrist and digits of operated UE: Minimal assistance(elbow and hand only) Sling wearing schedule (on at all times/off for ADL's): Independent Proper positioning of operated UE when showering: Minimal assistance;Patient able to independently direct caregiver Positioning of UE while sleeping: Patient able to independently direct caregiver;Moderate assistance     General Comments      Pertinent Vitals/ Pain       Pain Assessment: 0-10 Pain Score: 5  Pain Location: L UE  Pain Descriptors / Indicators: Discomfort;Grimacing;Operative site guarding Pain Intervention(s): Limited activity within patient's tolerance;Monitored during session;Premedicated before session;Repositioned;Ice applied  Home Living                                          Prior Functioning/Environment              Frequency  Min 2X/week        Progress Toward Goals  OT Goals(current goals can now be found in the care plan section)  Progress towards OT goals: Progressing toward goals  Acute Rehab OT Goals Patient Stated Goal: to get home  OT Goal Formulation: With patient Time For Goal Achievement: 03/30/19 Potential to Achieve Goals: Good ADL Goals Pt Will Perform Upper Body Bathing: with min assist;sitting Pt Will Perform Upper Body Dressing: sitting;with supervision Pt Will Perform Lower Body Dressing: with  supervision;sit to/from stand Pt Will Perform Tub/Shower Transfer: with modified independence;ambulating;shower seat;Shower transfer Pt/caregiver will Perform Home Exercise Program: Increased ROM;Left upper extremity;With written HEP provided  Plan Discharge plan remains appropriate;Frequency remains appropriate    Co-evaluation                 AM-PAC OT "6 Clicks" Daily Activity     Outcome Measure   Help from another person eating meals?: A Little Help from another person taking care of personal grooming?: A Little Help from another person toileting, which includes using toliet, bedpan, or urinal?: A Little Help from another person bathing (including washing, rinsing, drying)?: A Lot Help from another person to put on and taking off regular upper body clothing?: A Lot Help from another person to put on and taking off regular lower body clothing?: A Little 6 Click Score: 16    End of Session Equipment Utilized During Treatment: (sling)  OT Visit Diagnosis: Other abnormalities of gait and mobility (R26.89);Pain Pain - Right/Left: Left Pain - part of body: Shoulder;Hand;Arm   Activity Tolerance Patient tolerated treatment well   Patient Left in bed;with call bell/phone within reach   Nurse Communication Mobility status        Time: 5681-2751 OT Time Calculation (min): 57 min  Charges: OT General Charges $OT Visit: 1 Visit OT Treatments $Self Care/Home Management : 23-37 mins $Therapeutic Activity: 8-22 mins $Therapeutic Exercise: 8-22 mins     Darrol Jump  OTR/L 03/17/2019, 11:53 AM

## 2019-03-17 NOTE — Discharge Summary (Signed)
Patient ID: Stephanie Barnes MRN: [id number] DOB/AGE: 69/05/1951 69 y.o.  Admit date: 03/15/2019 Discharge date:   Primary Diagnosis: Left proximal humerus fracture Left distal radius fracture  Admission Diagnoses:  Past Medical History:  Diagnosis Date  . Abnormal EKG 06/13/2013  . Anemia 01/13/2016  . Anxiety   . Arthritis   . Complex regional pain syndrome 01/01/2016  . Depression   . 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 Distal radius fracture, left   . 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 Medicare annual wellness visit, subsequent 10/02/2015  . Multiple allergies 03/31/2016  . Proximal humerus fracture    Left  . Rotator cuff tear   . 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 Welcome to Medicare preventive visit 10/02/2015   Discharge Diagnoses:   Active Problems:   Closed fracture of left proximal humerus  Estimated body mass index is 24.62 kg/m as calculated from the following:   Height as of this encounter: 4' 11.75" (1.518 m).   Weight as of this encounter: 56.7 kg.  Procedure:  Procedure(s) (LRB): REVERSE SHOULDER ARTHROPLASTY (Left) OPEN REDUCTION INTERNAL FIXATION (ORIF) DISTAL RADIAL FRACTURE (Left)   Consults: None  HPI: Stephanie Barnes is a very unfortunate 68 year old female who sustained a left arm proximal humerus fracture and distal radius fracture.  She was indicated for operative management of both.  She was admitted for perioperative care. Laboratory Data: Hospital Outpatient Visit on 03/10/2019  Component Date Value Ref Range Status  . MRSA, PCR 03/10/2019 NEGATIVE  NEGATIVE Final  . Staphylococcus aureus 03/10/2019 NEGATIVE  NEGATIVE Final   Comment: (NOTE) The Xpert SA Assay (FDA approved for NASAL specimens in patients 28 years of age and older), is one component of a comprehensive surveillance program. It is not intended to diagnose infection nor to guide or monitor treatment. Performed at [facility], [place]  [address], [place], [place] [address]   . Sodium 03/10/2019 136  135 - 145 mmol/L Final  . Potassium 03/10/2019 4.3  3.5 - 5.1 mmol/L Final  . Chloride 03/10/2019 100  98 - 111 mmol/L Final  . CO2 03/10/2019 22  22 - 32 mmol/L Final  . Glucose, Bld 03/10/2019 105* 70 - 99 mg/dL Final  . BUN 03/10/2019 17  8 - 23 mg/dL Final  . Creatinine, Ser 03/10/2019 0.79  0.44 - 1.00 mg/dL Final  . Calcium 03/10/2019 9.3  8.9 - 10.3 mg/dL Final  . GFR calc non Af Amer 03/10/2019 >60  >60 mL/min Final  . GFR calc Af Amer 03/10/2019 >60  >60 mL/min Final  . Anion gap 03/10/2019 14  5 - 15 Final   Performed at [facility], [place] [address], [place], [place] [address]  . WBC 03/10/2019 8.4  4.0 - 10.5 K/uL Final  . RBC 03/10/2019 3.51* 3.87 - 5.11 MIL/uL Final  . Hemoglobin 03/10/2019 11.0* 12.0 - 15.0 g/dL Final  . HCT 03/10/2019 33.1* 36.0 - 46.0 % Final  . MCV 03/10/2019 94.3  80.0 - 100.0 fL Final  . MCH 03/10/2019 31.3  26.0 - 34.0 pg Final  . MCHC 03/10/2019 33.2  30.0 - 36.0 g/dL Final  . RDW 03/10/2019 12.1  11.5 - 15.5 % Final  . Platelets 03/10/2019 174  150 - 400 K/uL Final  . nRBC 03/10/2019 0.0  0.0 - 0.2 % Final   Performed at [facility], [place] [address] [place], [place] [address]  X-Rays:Dg Chest 2 View  Result Date: 03/07/2019 CLINICAL DATA:  Status post fall, left-sided pain EXAM: CHEST - 2 VIEW COMPARISON:  None. FINDINGS: There is no focal parenchymal opacity. There is no pleural effusion or pneumothorax. The heart and mediastinal contours are unremarkable. There is an acute comminuted left humeral neck fracture. IMPRESSION: No active cardiopulmonary disease. Acute comminuted left humeral neck fracture. Electronically Signed   By: Kathreen Devoid   On: 03/07/2019 12:11   Dg Forearm Left  Result Date: 03/07/2019 CLINICAL DATA:  Status post fall, left arm pain EXAM: LEFT FOREARM - 2 VIEW COMPARISON:  None. FINDINGS: Nondisplaced impacted fracture of the distal radial metaphysis  with 10 mm dorsal displacement. Acute nondisplaced fracture of the base of the ulnar styloid process. No other acute fracture or dislocation. IMPRESSION: Nondisplaced impacted fracture of the distal radial metaphysis with 10 mm dorsal displacement. Acute nondisplaced fracture of the base of the ulnar styloid process. Electronically Signed   By: Kathreen Devoid   On: 03/07/2019 12:05   Dg Wrist Complete Left  Result Date: 03/07/2019 CLINICAL DATA:  Left wrist pain EXAM: LEFT WRIST - COMPLETE 3+ VIEW COMPARISON:  None. FINDINGS: Nondisplaced impacted fracture of the distal radial metaphysis with 10 mm dorsal displacement. Acute nondisplaced fracture of the base of the ulnar styloid process. Generalized osteopenia. Mild osteoarthritis of scaphotrapeziotrapezoid joint. Mild osteoarthritis of the first Xenia joint. IMPRESSION: Nondisplaced impacted fracture of the distal radial metaphysis with 10 mm dorsal displacement. Acute nondisplaced fracture of the base of the ulnar styloid process. Electronically Signed   By: Kathreen Devoid   On: 03/07/2019 12:03   Ct Shoulder Left Wo Contrast  Result Date: 03/07/2019 CLINICAL DATA:  Fall from ladder. Comminuted left humeral neck fracture. EXAM: CT OF THE UPPER LEFT EXTREMITY WITHOUT CONTRAST TECHNIQUE: Multidetector CT imaging of the left shoulder was performed according to the standard protocol. COMPARISON:  Left shoulder radiographs same date. FINDINGS: Bones/Joint/Cartilage There is a severely comminuted and displaced fracture of the left humeral neck. The fracture extends into both tuberosities and involves the anterior aspect of humeral head articular surface. There is approximately 2.6 cm of anterior displacement into the deltoid muscle as well as significant apex anterior angulation. The humeral head remains located. There is no evidence of glenoid or distal clavicle fracture. There are underlying moderate acromioclavicular degenerative changes. The acromion is type 2.  There is a small shoulder joint effusion. Ligaments Suboptimally assessed by CT. Muscles and Tendons There is soft tissue swelling in the deltoid muscle, greatest anteriorly. Grossly intact rotator cuff. The long head of the biceps tendon is not well visualized, although is likely torn. Soft tissues No large periarticular hematoma demonstrated. There are mild emphysematous changes in the left lung. IMPRESSION: 1. Comminuted and significantly displaced/angulated fracture of the left humeral neck. Fracture involves the anterior aspect of the humeral head articular surface. 2. No dislocation or glenoid fracture. Electronically Signed   By: Richardean Sale M.D.   On: 03/07/2019 15:03   Dg Shoulder Left  Result Date: 03/07/2019 CLINICAL DATA:  Status post fall EXAM: LEFT SHOULDER - 2+ VIEW COMPARISON:  None. FINDINGS: Acute comminuted left humeral neck fracture with 2 cm of anterior displacement. Comminuted fracture of the superolateral humeral head involving the greater tuberosity with peripheral displacement measuring at least 9 mm. No other acute fracture or dislocation. IMPRESSION: Acute comminuted left humeral neck fracture with 2 cm of anterior displacement. Comminuted fracture of the superolateral humeral head involving the greater tuberosity with  peripheral displacement measuring at least 9 mm. Electronically Signed   By: Kathreen Devoid   On: 03/07/2019 12:13   Dg Shoulder Left Port  Result Date: 03/15/2019 CLINICAL DATA:  Reverse shoulder arthroplasty. EXAM: LEFT SHOULDER - 1 VIEW COMPARISON:  Multiple recent CT in radiographic studies. FINDINGS: Reverse shoulder arthroplasty on the left. Components appear well positioned. No radiographically detectable complication. Fragments associated with the comminuted humeral component remain visible. IMPRESSION: Reverse shoulder arthroplasty on the left. Electronically Signed   By: Nelson Chimes M.D.   On: 03/15/2019 13:36   Dg Humerus Left  Result Date: 03/07/2019  CLINICAL DATA:  Status post fall from a ladder EXAM: LEFT HUMERUS - 2+ VIEW COMPARISON:  None. FINDINGS: Comminuted fracture of the surgical neck of the left proximal humerus with involvement of the left greater tuberosity which is superolaterally displaced. 14 mm anterior displacement of the proximal shaft. No dislocation. No other fracture. IMPRESSION: Comminuted fracture of the surgical neck of the left proximal humerus with involvement of the left greater tuberosity which is superolaterally displaced. Electronically Signed   By: Kathreen Devoid   On: 03/07/2019 12:00    EKG: Orders placed or performed in visit on 10/02/15  . EKG 12-Lead     Hospital Course: Maryalyce Sanjuan is a 68 y.o. who was admitted to Hospital. They were brought to the operating room on 03/15/2019 and underwent Procedure(s): REVERSE SHOULDER ARTHROPLASTY OPEN REDUCTION INTERNAL FIXATION (ORIF) DISTAL RADIAL FRACTURE. By myself for the shoulder and Dr. Amedeo Plenty for the wrist.   Patient tolerated the procedure well and was later transferred to the recovery room and then to the orthopaedic floor for postoperative care.  They were given PO and IV analgesics for pain control following their surgery.  They were given 24 hours of postoperative antibiotics of  Anti-infectives (From admission, onward)   Start     Dose/Rate Route Frequency Ordered Stop   03/15/19 1400  ceFAZolin (ANCEF) IVPB 2g/100 mL premix     2 g 200 mL/hr over 30 Minutes Intravenous Every 6 hours 03/15/19 1330 03/16/19 0240   03/15/19 0630  ceFAZolin (ANCEF) IVPB 2g/100 mL premix     2 g 200 mL/hr over 30 Minutes Intravenous On call to O.R. 03/15/19 0617 03/15/19 0745   03/15/19 9211  ceFAZolin (ANCEF) 2-4 GM/100ML-% IVPB    Note to Pharmacy:  Bobbie Stack   : cabinet override      03/15/19 0620 03/15/19 0740     and started on DVT prophylaxis in the form of None.   OT were ordered for total joint protocol.    By day two, the patient had progressed with therapy  and meeting their goals.  Incision was healing well.  Patient was seen in rounds and was ready to go home.  She had no issues while on the inpatient service.   Diet: Regular diet Activity:NWB Follow-up:in 2 weeks Disposition - Home Discharged Condition: good   Discharge Instructions    Call MD / Call 911   Complete by:  As directed    If you experience chest pain or shortness of breath, CALL 911 and be transported to the hospital emergency room.  If you develope a fever above 101 F, pus (white drainage) or increased drainage or redness at the wound, or calf pain, call your surgeon's office.   Constipation Prevention   Complete by:  As directed    Drink plenty of fluids.  Prune juice may be helpful.  You may use  a stool softener, such as Colace (over the counter) 100 mg twice a day.  Use MiraLax (over the counter) for constipation as needed.   Diet - low sodium heart healthy   Complete by:  As directed    Increase activity slowly as tolerated   Complete by:  As directed      Allergies as of 03/17/2019      Reactions   Aspirin Anaphylaxis, Swelling   Swelling of lips and fingers    Celebrex [celecoxib] Anaphylaxis, Hives, Shortness Of Breath   Doxycycline Other (See Comments)   Scarred throat    Prednisone Shortness Of Breath, Other (See Comments)   Chest tightness    Fosamax [alendronate Sodium] Hives   Other Hives, Nausea Only   Arthritec   Amitriptyline    UNSPECIFIED REACTION    Chlorhexidine    UNSPECIFIED REACTION    Celexa [citalopram] Other (See Comments)   Shakiness    Clindamycin/lincomycin Nausea And Vomiting   Tape Itching, Other (See Comments)   UNSPECIFIED REACTION  Paper tape is okay to use per patient      Medication List    TAKE these medications   buPROPion 75 MG tablet Commonly known as:  WELLBUTRIN TAKE 1/2 (ONE-HALF) TABLET BY MOUTH ONCE DAILY What changed:    how much to take  how to take this  when to take this   CALCIUM 1200 PO Take 1  tablet by mouth 2 (two) times daily.   cetirizine 10 MG tablet Commonly known as:  ZYRTEC Take 10 mg by mouth daily as needed for allergies.   clonazePAM 1 MG tablet Commonly known as:  KLONOPIN Take 1 tablet (1 mg total) by mouth at bedtime.   ECHINACEA PO Take 1,200 mg by mouth 2 (two) times daily.   gabapentin 300 MG capsule Commonly known as:  NEURONTIN Take 1 capsule (300 mg total) by mouth 3 (three) times daily for 21 days.   HYDROmorphone 2 MG tablet Commonly known as:  DILAUDID Take 1-2 tablets (2-4 mg total) by mouth every 4 (four) hours as needed for moderate pain or severe pain (2 mg for moderate pain and 4 mg for severe pain.). What changed:  You were already taking a medication with the same name, and this prescription was added. Make sure you understand how and when to take each.   HYDROmorphone 2 MG tablet Commonly known as:  Dilaudid Take 1-2 tablets (2-4 mg total) by mouth every 4 (four) hours as needed for up to 7 days for severe pain. What changed:    how much to take  when to take this   methocarbamol 500 MG tablet Commonly known as:  ROBAXIN Take 1 tablet (500 mg total) by mouth every 6 (six) hours as needed for up to 30 days for muscle spasms.   mometasone 0.1 % ointment Commonly known as:  ELOCON Apply topically daily. As needed   ondansetron 4 MG disintegrating tablet Commonly known as:  Zofran ODT Take 1 tablet (4 mg total) by mouth every 8 (eight) hours as needed.   OVER THE COUNTER MEDICATION Apply 1 application topically as needed (pain). Hemp lotion   OVER THE COUNTER MEDICATION Take 500 mg by mouth daily as needed (pain). CBD oil   polyvinyl alcohol 1.4 % ophthalmic solution Commonly known as:  LIQUIFILM TEARS Place 1 drop into both eyes as needed for dry eyes.   PROBIOTIC DAILY PO Take 1 tablet by mouth daily. NOW 10   SUPER OMEGA-3  PO Take 1,200 mg by mouth 2 (two) times daily.   TURMERIC PO Take 894 mg by mouth 2 (two) times  daily. GAIA brand-Take 1 in the morning and 1 at night.   VITAMIN B 12 PO Take 1,000 mg by mouth daily.   vitamin C 1000 MG tablet Take 1,000 mg by mouth daily.      Follow-up Information    Nicholes Stairs, MD. Schedule an appointment as soon as possible for a visit in 2 week(s).   Specialty:  Orthopedic Surgery Contact information: [address] [place] [address] [phone]           Signed: Geralynn Rile, MD Orthopaedic Surgery 03/17/2019, 11:23 AM

## 2019-03-17 NOTE — Discharge Instructions (Signed)
-  Maintain your sling and continue with nonweightbearing to the left arm.  You may remove the sling just for your exercises to be performed on a daily basis.  No lifting with the left arm.  -Apply ice to the left wrist and shoulder for 20 to 30 minutes at a time when able.  You should try to do this once per hour that you are awake.  -Continue with your pain medication with the Dilaudid for breakthrough pain and Tylenol and Advil as needed around-the-clock.  You may also continue the gabapentin and Robaxin as needed.   -You may get the shoulder incision wet with a shower but do not submerge underwater.  The splint on the left wrist should remain dry at all times.  -Return to see Dr. Stann Mainland and Dr. Demetria Pore in 2 weeks.

## 2019-03-18 ENCOUNTER — Telehealth: Payer: Self-pay

## 2019-03-18 NOTE — Telephone Encounter (Signed)
TCM follow up call made to patient. Patient states she has appointment with both surgeons and she is doing pretty goos so she doesn't feel she needs to see Dr. Charlett Blake for hospital follow up at this time. Advised to call the office if she found she needed anything.

## 2019-03-24 ENCOUNTER — Telehealth: Payer: Self-pay

## 2019-03-24 NOTE — Telephone Encounter (Signed)
Copied from [place] [id number]. Topic: General - Other >> Mar 23, 2019 11:53 AM Carolyn Stare wrote:  Stephanie Barnes with Medicare say that would for pt to have a bone density test

## 2019-03-24 NOTE — Telephone Encounter (Signed)
Can patient have Bone density Please advise

## 2019-03-24 NOTE — Telephone Encounter (Signed)
It will be two years after 04/07/2019 so she can have it again after that date if the testing is being offered yet. We have had this testing on hold recently

## 2019-03-25 NOTE — Telephone Encounter (Signed)
Sent to me in error. 

## 2019-03-30 DIAGNOSIS — M25632 Stiffness of left wrist, not elsewhere classified: Secondary | ICD-10-CM | POA: Diagnosis not present

## 2019-03-30 DIAGNOSIS — M25612 Stiffness of left shoulder, not elsewhere classified: Secondary | ICD-10-CM | POA: Diagnosis not present

## 2019-03-30 DIAGNOSIS — Z4789 Encounter for other orthopedic aftercare: Secondary | ICD-10-CM | POA: Diagnosis not present

## 2019-03-30 DIAGNOSIS — S52502D Unspecified fracture of the lower end of left radius, subsequent encounter for closed fracture with routine healing: Secondary | ICD-10-CM | POA: Diagnosis not present

## 2019-03-31 ENCOUNTER — Other Ambulatory Visit: Payer: Self-pay | Admitting: Family Medicine

## 2019-03-31 ENCOUNTER — Encounter: Payer: Self-pay | Admitting: Family Medicine

## 2019-03-31 MED ORDER — CLONAZEPAM 1 MG PO TABS: 1.0000 mg | ORAL_TABLET | Freq: Every day | ORAL | 1 refills | Status: DC

## 2019-04-05 DIAGNOSIS — M25639 Stiffness of unspecified wrist, not elsewhere classified: Secondary | ICD-10-CM | POA: Diagnosis not present

## 2019-04-05 DIAGNOSIS — M25612 Stiffness of left shoulder, not elsewhere classified: Secondary | ICD-10-CM | POA: Diagnosis not present

## 2019-04-08 DIAGNOSIS — M25642 Stiffness of left hand, not elsewhere classified: Secondary | ICD-10-CM | POA: Diagnosis not present

## 2019-04-12 ENCOUNTER — Encounter: Payer: Self-pay | Admitting: Family Medicine

## 2019-04-12 DIAGNOSIS — M25642 Stiffness of left hand, not elsewhere classified: Secondary | ICD-10-CM | POA: Diagnosis not present

## 2019-04-12 DIAGNOSIS — M25612 Stiffness of left shoulder, not elsewhere classified: Secondary | ICD-10-CM | POA: Diagnosis not present

## 2019-04-13 DIAGNOSIS — Z4789 Encounter for other orthopedic aftercare: Secondary | ICD-10-CM | POA: Diagnosis not present

## 2019-04-13 DIAGNOSIS — S52502D Unspecified fracture of the lower end of left radius, subsequent encounter for closed fracture with routine healing: Secondary | ICD-10-CM | POA: Diagnosis not present

## 2019-04-14 DIAGNOSIS — M25642 Stiffness of left hand, not elsewhere classified: Secondary | ICD-10-CM | POA: Diagnosis not present

## 2019-04-14 DIAGNOSIS — M25612 Stiffness of left shoulder, not elsewhere classified: Secondary | ICD-10-CM | POA: Diagnosis not present

## 2019-04-19 DIAGNOSIS — M25642 Stiffness of left hand, not elsewhere classified: Secondary | ICD-10-CM | POA: Diagnosis not present

## 2019-04-19 DIAGNOSIS — M25612 Stiffness of left shoulder, not elsewhere classified: Secondary | ICD-10-CM | POA: Diagnosis not present

## 2019-04-21 DIAGNOSIS — M25612 Stiffness of left shoulder, not elsewhere classified: Secondary | ICD-10-CM | POA: Diagnosis not present

## 2019-04-21 DIAGNOSIS — M25642 Stiffness of left hand, not elsewhere classified: Secondary | ICD-10-CM | POA: Diagnosis not present

## 2019-04-22 ENCOUNTER — Other Ambulatory Visit (HOSPITAL_BASED_OUTPATIENT_CLINIC_OR_DEPARTMENT_OTHER): Payer: Self-pay | Admitting: Orthopedic Surgery

## 2019-04-22 ENCOUNTER — Other Ambulatory Visit: Payer: Self-pay

## 2019-04-22 ENCOUNTER — Ambulatory Visit (HOSPITAL_BASED_OUTPATIENT_CLINIC_OR_DEPARTMENT_OTHER): Payer: Medicare HMO

## 2019-04-22 ENCOUNTER — Ambulatory Visit (HOSPITAL_BASED_OUTPATIENT_CLINIC_OR_DEPARTMENT_OTHER)
Admission: RE | Admit: 2019-04-22 | Discharge: 2019-04-22 | Disposition: A | Discharge status: Home / Self Care | Payer: Medicare HMO | Source: Ambulatory Visit | Attending: Orthopedic Surgery | Admitting: Orthopedic Surgery

## 2019-04-22 DIAGNOSIS — R600 Localized edema: Secondary | ICD-10-CM | POA: Diagnosis not present

## 2019-04-22 DIAGNOSIS — R609 Edema, unspecified: Secondary | ICD-10-CM | POA: Diagnosis not present

## 2019-04-22 DIAGNOSIS — R52 Pain, unspecified: Secondary | ICD-10-CM

## 2019-04-27 DIAGNOSIS — M25642 Stiffness of left hand, not elsewhere classified: Secondary | ICD-10-CM | POA: Diagnosis not present

## 2019-04-27 DIAGNOSIS — M25612 Stiffness of left shoulder, not elsewhere classified: Secondary | ICD-10-CM | POA: Diagnosis not present

## 2019-04-27 DIAGNOSIS — Z4789 Encounter for other orthopedic aftercare: Secondary | ICD-10-CM | POA: Diagnosis not present

## 2019-04-29 DIAGNOSIS — M25612 Stiffness of left shoulder, not elsewhere classified: Secondary | ICD-10-CM | POA: Diagnosis not present

## 2019-04-29 DIAGNOSIS — M25642 Stiffness of left hand, not elsewhere classified: Secondary | ICD-10-CM | POA: Diagnosis not present

## 2019-05-03 DIAGNOSIS — M25612 Stiffness of left shoulder, not elsewhere classified: Secondary | ICD-10-CM | POA: Diagnosis not present

## 2019-05-04 DIAGNOSIS — M25642 Stiffness of left hand, not elsewhere classified: Secondary | ICD-10-CM | POA: Diagnosis not present

## 2019-05-04 DIAGNOSIS — S52502D Unspecified fracture of the lower end of left radius, subsequent encounter for closed fracture with routine healing: Secondary | ICD-10-CM | POA: Diagnosis not present

## 2019-05-04 DIAGNOSIS — Z4789 Encounter for other orthopedic aftercare: Secondary | ICD-10-CM | POA: Diagnosis not present

## 2019-05-06 DIAGNOSIS — M25642 Stiffness of left hand, not elsewhere classified: Secondary | ICD-10-CM | POA: Diagnosis not present

## 2019-05-09 DIAGNOSIS — M25642 Stiffness of left hand, not elsewhere classified: Secondary | ICD-10-CM | POA: Diagnosis not present

## 2019-05-09 DIAGNOSIS — M25612 Stiffness of left shoulder, not elsewhere classified: Secondary | ICD-10-CM | POA: Diagnosis not present

## 2019-05-12 DIAGNOSIS — M25642 Stiffness of left hand, not elsewhere classified: Secondary | ICD-10-CM | POA: Diagnosis not present

## 2019-05-12 DIAGNOSIS — M25612 Stiffness of left shoulder, not elsewhere classified: Secondary | ICD-10-CM | POA: Diagnosis not present

## 2019-05-17 DIAGNOSIS — M25642 Stiffness of left hand, not elsewhere classified: Secondary | ICD-10-CM | POA: Diagnosis not present

## 2019-05-17 DIAGNOSIS — M25612 Stiffness of left shoulder, not elsewhere classified: Secondary | ICD-10-CM | POA: Diagnosis not present

## 2019-05-19 DIAGNOSIS — M25642 Stiffness of left hand, not elsewhere classified: Secondary | ICD-10-CM | POA: Diagnosis not present

## 2019-05-19 DIAGNOSIS — M25612 Stiffness of left shoulder, not elsewhere classified: Secondary | ICD-10-CM | POA: Diagnosis not present

## 2019-05-23 ENCOUNTER — Encounter: Payer: Self-pay | Admitting: Family Medicine

## 2019-05-24 DIAGNOSIS — M25612 Stiffness of left shoulder, not elsewhere classified: Secondary | ICD-10-CM | POA: Diagnosis not present

## 2019-05-24 DIAGNOSIS — M25642 Stiffness of left hand, not elsewhere classified: Secondary | ICD-10-CM | POA: Diagnosis not present

## 2019-05-26 DIAGNOSIS — M25642 Stiffness of left hand, not elsewhere classified: Secondary | ICD-10-CM | POA: Diagnosis not present

## 2019-05-26 DIAGNOSIS — Z4789 Encounter for other orthopedic aftercare: Secondary | ICD-10-CM | POA: Diagnosis not present

## 2019-05-26 DIAGNOSIS — S52502D Unspecified fracture of the lower end of left radius, subsequent encounter for closed fracture with routine healing: Secondary | ICD-10-CM | POA: Diagnosis not present

## 2019-05-26 DIAGNOSIS — M25612 Stiffness of left shoulder, not elsewhere classified: Secondary | ICD-10-CM | POA: Diagnosis not present

## 2019-05-31 DIAGNOSIS — M25612 Stiffness of left shoulder, not elsewhere classified: Secondary | ICD-10-CM | POA: Diagnosis not present

## 2019-05-31 DIAGNOSIS — M25642 Stiffness of left hand, not elsewhere classified: Secondary | ICD-10-CM | POA: Diagnosis not present

## 2019-06-02 ENCOUNTER — Other Ambulatory Visit: Payer: Self-pay | Admitting: Family Medicine

## 2019-06-02 DIAGNOSIS — M25642 Stiffness of left hand, not elsewhere classified: Secondary | ICD-10-CM | POA: Diagnosis not present

## 2019-06-02 DIAGNOSIS — M25612 Stiffness of left shoulder, not elsewhere classified: Secondary | ICD-10-CM | POA: Diagnosis not present

## 2019-06-07 ENCOUNTER — Other Ambulatory Visit: Payer: Self-pay

## 2019-06-07 ENCOUNTER — Ambulatory Visit (INDEPENDENT_AMBULATORY_CARE_PROVIDER_SITE_OTHER): Payer: Medicare HMO | Admitting: Family Medicine

## 2019-06-07 DIAGNOSIS — D649 Anemia, unspecified: Secondary | ICD-10-CM | POA: Diagnosis not present

## 2019-06-07 DIAGNOSIS — M25612 Stiffness of left shoulder, not elsewhere classified: Secondary | ICD-10-CM | POA: Diagnosis not present

## 2019-06-07 DIAGNOSIS — E782 Mixed hyperlipidemia: Secondary | ICD-10-CM

## 2019-06-07 DIAGNOSIS — E559 Vitamin D deficiency, unspecified: Secondary | ICD-10-CM | POA: Diagnosis not present

## 2019-06-07 DIAGNOSIS — S42302S Unspecified fracture of shaft of humerus, left arm, sequela: Secondary | ICD-10-CM

## 2019-06-07 NOTE — Assessment & Plan Note (Signed)
Supplement and monitor 

## 2019-06-07 NOTE — Assessment & Plan Note (Signed)
Encouraged heart healthy diet, increase exercise, avoid trans fats, consider a krill oil cap daily 

## 2019-06-07 NOTE — Assessment & Plan Note (Signed)
Increase leafy greens, consider increased lean red meat and using cast iron cookware. Continue to monitor, report any concerns 

## 2019-06-08 DIAGNOSIS — S42302A Unspecified fracture of shaft of humerus, left arm, initial encounter for closed fracture: Secondary | ICD-10-CM | POA: Insufficient documentation

## 2019-06-08 NOTE — Progress Notes (Signed)
Virtual Visit via Video Note  I connected with Stephanie Barnes on 06/07/19 at  2:40 PM EDT by a video enabled telemedicine application and verified that I am speaking with the correct person using two identifiers.  Location: Patient: home Provider: office   I discussed the limitations of evaluation and management by telemedicine and the availability of in person appointments. The patient expressed understanding and agreed to proceed. Stephanie Barnes, CMA was able to get the patient set up on a visit via video visit.       Subjective:    Patient ID: Stephanie Barnes, female    DOB: Mar 31, 1951, 68 y.o.   MRN: [id number]  No chief complaint on file.   HPI Patient is in today for follow up on chronic concerns including chronic pain including complex regional pain syndrome, hyperlipidemia, vitamin D deficiency and more. No recent febrile illness but she has had to have surgery to correct fractures in her left arm. She is healing slowly and is undergoing PT. Denies CP/palp/SOB/HA/congestion/fevers/GI or GU c/o. Taking meds as prescribed  Past Medical History:  Diagnosis Date  . Abnormal EKG 06/13/2013  . Anemia 01/13/2016  . Anxiety   . Arthritis   . Complex regional pain syndrome 01/01/2016  . Depression   . 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 Distal radius fracture, left   . 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 Medicare annual wellness visit, subsequent 10/02/2015  . Multiple allergies 03/31/2016  . Proximal humerus fracture    Left  . Rotator cuff tear   . 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 Welcome to Medicare preventive visit 10/02/2015    Past Surgical History:  Procedure Laterality Date  . DENTAL SURGERY     Root cleaning   . LASIK    . MOUTH SURGERY  08/10/2017   front lower  . OPEN REDUCTION INTERNAL FIXATION (ORIF) DISTAL RADIAL FRACTURE Left 03/15/2019   Procedure: OPEN REDUCTION INTERNAL FIXATION (ORIF) DISTAL RADIAL FRACTURE;   Surgeon: Roseanne Kaufman, MD;  Location: [facility];  Service: Orthopedics;  Laterality: Left;  . REFRACTIVE SURGERY Bilateral 2000  . REVERSE SHOULDER ARTHROPLASTY Left 03/15/2019   Procedure: REVERSE SHOULDER ARTHROPLASTY;  Surgeon: Nicholes Stairs, MD;  Location: [facility];  Service: Orthopedics;  Laterality: Left;  242min  . ROTATOR CUFF REPAIR Right 2017  . TONSILLECTOMY      Family History  Problem Relation Age of Onset  . Heart disease Mother   . Arthritis Mother   . Heart disease Father   . COPD Father   . Mental illness Brother        bipolar  . Hypertension Brother   . Suicidality Brother   . Alcohol abuse Brother   . Heart disease Maternal Aunt   . Heart disease Maternal Uncle   . Heart disease Paternal Aunt   . COPD Paternal 9   . Heart disease Paternal Uncle   . Heart disease Maternal Grandmother   . Heart disease Maternal Grandfather   . Heart disease Paternal Grandmother   . Heart disease Paternal Grandfather     Social History   Socioeconomic History  . Marital status: Single    Spouse name: Not on file  . Number of children: Not on file  . Years of education: Not on file  . Highest education level: Not on file  Occupational History  . Not on file  Social Needs  . Financial resource strain: Not  on file  . Food insecurity    Worry: Not on file    Inability: Not on file  . Transportation needs    Medical: Not on file    Non-medical: Not on file  Tobacco Use  . Smoking status: Never Smoker  . Smokeless tobacco: Never Used  Substance and Sexual Activity  . Alcohol use: Yes    Alcohol/week: 1.0 standard drinks    Types: 1 Glasses of wine per week    Comment: per week  . Drug use: No  . Sexual activity: Not Currently    Comment: lives alone, retired from [employer], no dietary restrictions  Lifestyle  . Physical activity    Days per week: Not on file    Minutes per session: Not on file  . Stress: Not on file  Relationships  . Social  Herbalist on phone: Not on file    Gets together: Not on file    Attends religious service: Not on file    Active member of club or organization: Not on file    Attends meetings of clubs or organizations: Not on file    Relationship status: Not on file  . Intimate partner violence    Fear of current or ex partner: Not on file    Emotionally abused: Not on file    Physically abused: Not on file    Forced sexual activity: Not on file  Other Topics Concern  . Not on file  Social History Narrative  . Not on file    Outpatient Medications Prior to Visit  Medication Sig Dispense Refill  . Ascorbic Acid (VITAMIN C) 1000 MG tablet Take 1,000 mg by mouth daily.    Marland Kitchen buPROPion (WELLBUTRIN) 75 MG tablet Take 1/2 (one-half) tablet by mouth once daily 30 tablet 3  . Calcium Carbonate-Vit D-Min (CALCIUM 1200 PO) Take 1 tablet by mouth 2 (two) times daily.    . cetirizine (ZYRTEC) 10 MG tablet Take 10 mg by mouth daily as needed for allergies.     . clonazePAM (KLONOPIN) 1 MG tablet Take 1 tablet (1 mg total) by mouth at bedtime. 90 tablet 1  . Cyanocobalamin (VITAMIN B 12 PO) Take 1,000 mg by mouth daily.     Marland Kitchen ECHINACEA PO Take 1,200 mg by mouth 2 (two) times daily.    Marland Kitchen gabapentin (NEURONTIN) 300 MG capsule Take 1 capsule (300 mg total) by mouth 3 (three) times daily for 21 days. 63 capsule 0  . HYDROmorphone (DILAUDID) 2 MG tablet Take 1-2 tablets (2-4 mg total) by mouth every 4 (four) hours as needed for moderate pain or severe pain (2 mg for moderate pain and 4 mg for severe pain.). 50 tablet 0  . mometasone (ELOCON) 0.1 % ointment Apply topically daily. As needed (Patient not taking: Reported on 03/07/2019) 15 g 0  . Omega-3 Fatty Acids (SUPER OMEGA-3 PO) Take 1,200 mg by mouth 2 (two) times daily.    . ondansetron (ZOFRAN ODT) 4 MG disintegrating tablet Take 1 tablet (4 mg total) by mouth every 8 (eight) hours as needed. 20 tablet 0  . OVER THE COUNTER MEDICATION Apply 1  application topically as needed (pain). Hemp lotion    . OVER THE COUNTER MEDICATION Take 500 mg by mouth daily as needed (pain). CBD oil    . polyvinyl alcohol (LIQUIFILM TEARS) 1.4 % ophthalmic solution Place 1 drop into both eyes as needed for dry eyes.    . Probiotic Product (PROBIOTIC  DAILY PO) Take 1 tablet by mouth daily. NOW 10    . TURMERIC PO Take 894 mg by mouth 2 (two) times daily. GAIA brand-Take 1 in the morning and 1 at night.     No facility-administered medications prior to visit.     Allergies  Allergen Reactions  . Aspirin Anaphylaxis and Swelling    Swelling of lips and fingers   . Celebrex [Celecoxib] Anaphylaxis, Hives and Shortness Of Breath  . Doxycycline Other (See Comments)    Scarred throat   . Prednisone Shortness Of Breath and Other (See Comments)    Chest tightness   . Fosamax [Alendronate Sodium] Hives  . Other Hives and Nausea Only    Arthritec  . Amitriptyline     UNSPECIFIED REACTION   . Chlorhexidine     UNSPECIFIED REACTION   . Celexa [Citalopram] Other (See Comments)    Shakiness   . Clindamycin/Lincomycin Nausea And Vomiting  . Tape Itching and Other (See Comments)    UNSPECIFIED REACTION  Paper tape is okay to use per patient    Review of Systems  Constitutional: Positive for malaise/fatigue. Negative for fever.  HENT: Negative for congestion.   Eyes: Negative for blurred vision.  Respiratory: Negative for shortness of breath.   Cardiovascular: Negative for chest pain, palpitations and leg swelling.  Gastrointestinal: Negative for abdominal pain, blood in stool and nausea.  Genitourinary: Negative for dysuria and frequency.  Musculoskeletal: Positive for joint pain, myalgias and neck pain. Negative for falls.  Skin: Negative for rash.  Neurological: Negative for dizziness, loss of consciousness and headaches.  Endo/Heme/Allergies: Negative for environmental allergies.  Psychiatric/Behavioral: Negative for depression. The patient is  not nervous/anxious.        Objective:    Physical Exam Vitals signs and nursing note reviewed.  Constitutional:      General: She is not in acute distress.    Appearance: She is well-developed.  HENT:     Head: Normocephalic and atraumatic.     Nose: Nose normal.  Eyes:     General:        Right eye: No discharge.        Left eye: No discharge.  Neck:     Musculoskeletal: Normal range of motion and neck supple.  Cardiovascular:     Rate and Rhythm: Normal rate and regular rhythm.     Heart sounds: No murmur.  Pulmonary:     Effort: Pulmonary effort is normal.     Breath sounds: Normal breath sounds.  Abdominal:     General: Bowel sounds are normal.     Palpations: Abdomen is soft.     Tenderness: There is no abdominal tenderness.  Skin:    General: Skin is warm and dry.  Neurological:     Mental Status: She is alert and oriented to person, place, and time.     There were no vitals taken for this visit. Wt Readings from Last 3 Encounters:  03/15/19 125 lb (56.7 kg)  03/10/19 125 lb (56.7 kg)  12/07/18 125 lb 9.6 oz (57 kg)    Diabetic Foot Exam - Simple   No data filed     Lab Results  Component Value Date   WBC 8.4 03/10/2019   HGB 11.0 (L) 03/10/2019   HCT 33.1 (L) 03/10/2019   PLT 174 03/10/2019   GLUCOSE 105 (H) 03/10/2019   CHOL 205 (H) 12/07/2018   TRIG 180.0 (H) 12/07/2018   HDL 68.60 12/07/2018   LDLCALC 100 (  H) 12/07/2018   ALT 13 12/07/2018   AST 16 12/07/2018   NA 136 03/10/2019   K 4.3 03/10/2019   CL 100 03/10/2019   CREATININE 0.79 03/10/2019   BUN 17 03/10/2019   CO2 22 03/10/2019   TSH 1.91 12/07/2018    Lab Results  Component Value Date   TSH 1.91 12/07/2018   Lab Results  Component Value Date   WBC 8.4 03/10/2019   HGB 11.0 (L) 03/10/2019   HCT 33.1 (L) 03/10/2019   MCV 94.3 03/10/2019   PLT 174 03/10/2019   Lab Results  Component Value Date   NA 136 03/10/2019   K 4.3 03/10/2019   CO2 22 03/10/2019   GLUCOSE 105  (H) 03/10/2019   BUN 17 03/10/2019   CREATININE 0.79 03/10/2019   BILITOT 0.3 12/07/2018   ALKPHOS 48 12/07/2018   AST 16 12/07/2018   ALT 13 12/07/2018   PROT 6.9 12/07/2018   ALBUMIN 4.4 12/07/2018   CALCIUM 9.3 03/10/2019   ANIONGAP 14 03/10/2019   GFR 61.40 12/07/2018   Lab Results  Component Value Date   CHOL 205 (H) 12/07/2018   Lab Results  Component Value Date   HDL 68.60 12/07/2018   Lab Results  Component Value Date   LDLCALC 100 (H) 12/07/2018   Lab Results  Component Value Date   TRIG 180.0 (H) 12/07/2018   Lab Results  Component Value Date   CHOLHDL 3 12/07/2018   No results found for: HGBA1C     Assessment & Plan:   Problem List Items Addressed This Visit    Hyperlipidemia    Encouraged heart healthy diet, increase exercise, avoid trans fats, consider a krill oil cap daily      Relevant Orders   Lipid panel   TSH   Anemia    Increase leafy greens, consider increased lean red meat and using cast iron cookware. Continue to monitor, report any concerns      Relevant Orders   CBC   Comprehensive metabolic panel   Vitamin D deficiency    Supplement and monitor      Closed left arm fracture    She fell off of a ladder at her home and suffered a humerus and radial fracture requiring surgical correction. She is slowly recovering and is in physical therapy now.          I am having Stephanie Barnes maintain her Probiotic Product (PROBIOTIC DAILY PO), Calcium Carbonate-Vit D-Min (CALCIUM 1200 PO), cetirizine, mometasone, Cyanocobalamin (VITAMIN B 12 PO), vitamin C, TURMERIC PO, Omega-3 Fatty Acids (SUPER OMEGA-3 PO), ECHINACEA PO, OVER THE COUNTER MEDICATION, OVER THE COUNTER MEDICATION, polyvinyl alcohol, HYDROmorphone, gabapentin, ondansetron, clonazePAM, and buPROPion.  No orders of the defined types were placed in this encounter.    Penni Homans, MD I discussed the assessment and treatment plan with the patient. The patient was provided  an opportunity to ask questions and all were answered. The patient agreed with the plan and demonstrated an understanding of the instructions.   The patient was advised to call back or seek an in-person evaluation if the symptoms worsen or if the condition fails to improve as anticipated.  I provided 25 minutes of non-face-to-face time during this encounter.   Penni Homans, MD

## 2019-06-08 NOTE — Assessment & Plan Note (Signed)
She fell off of a ladder at her home and suffered a humerus and radial fracture requiring surgical correction. She is slowly recovering and is in physical therapy now.

## 2019-06-09 DIAGNOSIS — M25612 Stiffness of left shoulder, not elsewhere classified: Secondary | ICD-10-CM | POA: Diagnosis not present

## 2019-06-09 DIAGNOSIS — M25532 Pain in left wrist: Secondary | ICD-10-CM | POA: Diagnosis not present

## 2019-06-14 DIAGNOSIS — M25532 Pain in left wrist: Secondary | ICD-10-CM | POA: Diagnosis not present

## 2019-06-16 ENCOUNTER — Encounter: Payer: Self-pay | Admitting: Family Medicine

## 2019-06-16 DIAGNOSIS — M25612 Stiffness of left shoulder, not elsewhere classified: Secondary | ICD-10-CM | POA: Diagnosis not present

## 2019-06-16 DIAGNOSIS — M25532 Pain in left wrist: Secondary | ICD-10-CM | POA: Diagnosis not present

## 2019-06-21 DIAGNOSIS — M25612 Stiffness of left shoulder, not elsewhere classified: Secondary | ICD-10-CM | POA: Diagnosis not present

## 2019-06-21 DIAGNOSIS — M25642 Stiffness of left hand, not elsewhere classified: Secondary | ICD-10-CM | POA: Diagnosis not present

## 2019-06-23 DIAGNOSIS — S52502D Unspecified fracture of the lower end of left radius, subsequent encounter for closed fracture with routine healing: Secondary | ICD-10-CM | POA: Diagnosis not present

## 2019-06-23 DIAGNOSIS — M25642 Stiffness of left hand, not elsewhere classified: Secondary | ICD-10-CM | POA: Diagnosis not present

## 2019-06-23 DIAGNOSIS — M25532 Pain in left wrist: Secondary | ICD-10-CM | POA: Diagnosis not present

## 2019-06-28 DIAGNOSIS — M25632 Stiffness of left wrist, not elsewhere classified: Secondary | ICD-10-CM | POA: Diagnosis not present

## 2019-06-28 DIAGNOSIS — M25612 Stiffness of left shoulder, not elsewhere classified: Secondary | ICD-10-CM | POA: Diagnosis not present

## 2019-06-30 DIAGNOSIS — M25612 Stiffness of left shoulder, not elsewhere classified: Secondary | ICD-10-CM | POA: Diagnosis not present

## 2019-06-30 DIAGNOSIS — M25642 Stiffness of left hand, not elsewhere classified: Secondary | ICD-10-CM | POA: Diagnosis not present

## 2019-07-05 DIAGNOSIS — M25612 Stiffness of left shoulder, not elsewhere classified: Secondary | ICD-10-CM | POA: Diagnosis not present

## 2019-07-05 DIAGNOSIS — M25642 Stiffness of left hand, not elsewhere classified: Secondary | ICD-10-CM | POA: Diagnosis not present

## 2019-07-07 DIAGNOSIS — M25642 Stiffness of left hand, not elsewhere classified: Secondary | ICD-10-CM | POA: Diagnosis not present

## 2019-07-07 DIAGNOSIS — M25612 Stiffness of left shoulder, not elsewhere classified: Secondary | ICD-10-CM | POA: Diagnosis not present

## 2019-07-11 DIAGNOSIS — R69 Illness, unspecified: Secondary | ICD-10-CM | POA: Diagnosis not present

## 2019-07-12 DIAGNOSIS — M25642 Stiffness of left hand, not elsewhere classified: Secondary | ICD-10-CM | POA: Diagnosis not present

## 2019-07-12 DIAGNOSIS — M25612 Stiffness of left shoulder, not elsewhere classified: Secondary | ICD-10-CM | POA: Diagnosis not present

## 2019-07-14 DIAGNOSIS — M25642 Stiffness of left hand, not elsewhere classified: Secondary | ICD-10-CM | POA: Diagnosis not present

## 2019-07-19 DIAGNOSIS — M25642 Stiffness of left hand, not elsewhere classified: Secondary | ICD-10-CM | POA: Diagnosis not present

## 2019-07-19 DIAGNOSIS — M25612 Stiffness of left shoulder, not elsewhere classified: Secondary | ICD-10-CM | POA: Diagnosis not present

## 2019-07-20 DIAGNOSIS — S42202D Unspecified fracture of upper end of left humerus, subsequent encounter for fracture with routine healing: Secondary | ICD-10-CM | POA: Diagnosis not present

## 2019-07-21 DIAGNOSIS — M25532 Pain in left wrist: Secondary | ICD-10-CM | POA: Diagnosis not present

## 2019-07-21 DIAGNOSIS — M25612 Stiffness of left shoulder, not elsewhere classified: Secondary | ICD-10-CM | POA: Diagnosis not present

## 2019-07-21 DIAGNOSIS — S52502D Unspecified fracture of the lower end of left radius, subsequent encounter for closed fracture with routine healing: Secondary | ICD-10-CM | POA: Diagnosis not present

## 2019-07-21 DIAGNOSIS — M25642 Stiffness of left hand, not elsewhere classified: Secondary | ICD-10-CM | POA: Diagnosis not present

## 2019-07-26 DIAGNOSIS — M25642 Stiffness of left hand, not elsewhere classified: Secondary | ICD-10-CM | POA: Diagnosis not present

## 2019-07-26 DIAGNOSIS — M25612 Stiffness of left shoulder, not elsewhere classified: Secondary | ICD-10-CM | POA: Diagnosis not present

## 2019-07-27 ENCOUNTER — Telehealth: Payer: Self-pay

## 2019-07-27 ENCOUNTER — Ambulatory Visit: Payer: Medicare HMO

## 2019-07-27 ENCOUNTER — Other Ambulatory Visit (INDEPENDENT_AMBULATORY_CARE_PROVIDER_SITE_OTHER): Payer: Medicare HMO

## 2019-07-27 ENCOUNTER — Other Ambulatory Visit: Payer: Self-pay

## 2019-07-27 DIAGNOSIS — E782 Mixed hyperlipidemia: Secondary | ICD-10-CM | POA: Diagnosis not present

## 2019-07-27 DIAGNOSIS — D649 Anemia, unspecified: Secondary | ICD-10-CM | POA: Diagnosis not present

## 2019-07-27 DIAGNOSIS — Z79899 Other long term (current) drug therapy: Secondary | ICD-10-CM

## 2019-07-27 LAB — COMPREHENSIVE METABOLIC PANEL
ALT: 10 U/L (ref 0–35)
AST: 13 U/L (ref 0–37)
Albumin: 4 g/dL (ref 3.5–5.2)
Alkaline Phosphatase: 62 U/L (ref 39–117)
BUN: 22 mg/dL (ref 6–23)
CO2: 30 mEq/L (ref 19–32)
Calcium: 9.6 mg/dL (ref 8.4–10.5)
Chloride: 102 mEq/L (ref 96–112)
Creatinine, Ser: 0.84 mg/dL (ref 0.40–1.20)
GFR: 67.22 mL/min (ref >60.00)
Glucose, Bld: 83 mg/dL (ref 70–99)
Potassium: 4.1 mEq/L (ref 3.5–5.1)
Sodium: 139 mEq/L (ref 135–145)
Total Bilirubin: 0.4 mg/dL (ref 0.2–1.2)
Total Protein: 6.5 g/dL (ref 6.0–8.3)

## 2019-07-27 LAB — TSH: TSH: 2.41 u[IU]/mL (ref 0.35–4.50)

## 2019-07-27 LAB — CBC
HCT: 36.8 % (ref 36.0–46.0)
Hemoglobin: 12 g/dL (ref 12.0–15.0)
MCHC: 32.6 g/dL (ref 30.0–36.0)
MCV: 88.5 fl (ref 78.0–100.0)
Platelets: 189 10*3/uL (ref 150.0–400.0)
RBC: 4.16 Mil/uL (ref 3.87–5.11)
RDW: 14 % (ref 11.5–15.5)
WBC: 4.5 10*3/uL (ref 4.0–10.5)

## 2019-07-27 LAB — LIPID PANEL
Cholesterol: 177 mg/dL (ref 0–200)
HDL: 68.2 mg/dL (ref >39.00)
LDL Cholesterol: 87 mg/dL (ref 0–99)
NonHDL: 108.58
Total CHOL/HDL Ratio: 3
Triglycerides: 108 mg/dL (ref 0.0–149.0)
VLDL: 21.6 mg/dL (ref 0.0–40.0)

## 2019-07-27 NOTE — Telephone Encounter (Signed)
She can have requested letter please prepare for visit

## 2019-07-27 NOTE — Telephone Encounter (Signed)
Patient requesting letter from PCP stating she cannot lift her arms up d/t bilateral shoulder surgeries.  Patient is unable to reach mailbox (mailbox cluster in community) due to height of mailbox and inability to lift arms in order to reach.  She is planning to install individual mailbox by cluster or hanging type by her door. [employer] and [employer] is requiring letter from PCP.   Pt would like to pick up letter at upcoming appointment on 08/08/2019 if possible.

## 2019-07-28 DIAGNOSIS — M25642 Stiffness of left hand, not elsewhere classified: Secondary | ICD-10-CM | POA: Diagnosis not present

## 2019-07-29 NOTE — Telephone Encounter (Signed)
Wrote note and mailed letter patient

## 2019-07-30 LAB — PAIN MGMT, PROFILE 8 W/CONF, U
6 Acetylmorphine: NEGATIVE ng/mL
Alcohol Metabolites: NEGATIVE ng/mL (ref <500)
Alphahydroxyalprazolam: NEGATIVE ng/mL
Alphahydroxymidazolam: NEGATIVE ng/mL
Alphahydroxytriazolam: NEGATIVE ng/mL
Aminoclonazepam: 349 ng/mL
Amphetamines: NEGATIVE ng/mL
Benzodiazepines: POSITIVE ng/mL
Buprenorphine, Urine: NEGATIVE ng/mL
Cocaine Metabolite: NEGATIVE ng/mL
Codeine: NEGATIVE ng/mL
Creatinine: 121.3 mg/dL
Hydrocodone: NEGATIVE ng/mL
Hydromorphone: 4193 ng/mL
Hydroxyethylflurazepam: NEGATIVE ng/mL
Lorazepam: NEGATIVE ng/mL
MDMA: NEGATIVE ng/mL
Marijuana Metabolite: NEGATIVE ng/mL
Marijuana Metabolite: NEGATIVE ng/mL
Morphine: NEGATIVE ng/mL
Nordiazepam: NEGATIVE ng/mL
Norhydrocodone: NEGATIVE ng/mL
Opiates: POSITIVE ng/mL
Oxazepam: NEGATIVE ng/mL
Oxidant: NEGATIVE ug/mL
Oxycodone: NEGATIVE ng/mL
Temazepam: NEGATIVE ng/mL
pH: 5.5 (ref 4.5–9.0)

## 2019-08-02 DIAGNOSIS — M25642 Stiffness of left hand, not elsewhere classified: Secondary | ICD-10-CM | POA: Diagnosis not present

## 2019-08-02 DIAGNOSIS — M25612 Stiffness of left shoulder, not elsewhere classified: Secondary | ICD-10-CM | POA: Diagnosis not present

## 2019-08-04 DIAGNOSIS — M25612 Stiffness of left shoulder, not elsewhere classified: Secondary | ICD-10-CM | POA: Diagnosis not present

## 2019-08-04 DIAGNOSIS — M25642 Stiffness of left hand, not elsewhere classified: Secondary | ICD-10-CM | POA: Diagnosis not present

## 2019-08-05 ENCOUNTER — Ambulatory Visit: Payer: Medicare HMO | Admitting: Family Medicine

## 2019-08-05 ENCOUNTER — Other Ambulatory Visit: Payer: Self-pay

## 2019-08-08 ENCOUNTER — Encounter: Payer: Self-pay | Admitting: Family Medicine

## 2019-08-08 ENCOUNTER — Ambulatory Visit: Payer: Medicare HMO | Admitting: Family Medicine

## 2019-08-08 ENCOUNTER — Other Ambulatory Visit: Payer: Self-pay

## 2019-08-08 VITALS — BP 106/56 | HR 67 | Temp 98.0°F | Resp 18 | Wt 114.0 lb

## 2019-08-08 DIAGNOSIS — E673 Hypervitaminosis D: Secondary | ICD-10-CM

## 2019-08-08 DIAGNOSIS — F418 Other specified anxiety disorders: Secondary | ICD-10-CM

## 2019-08-08 DIAGNOSIS — E559 Vitamin D deficiency, unspecified: Secondary | ICD-10-CM

## 2019-08-08 DIAGNOSIS — E782 Mixed hyperlipidemia: Secondary | ICD-10-CM

## 2019-08-08 DIAGNOSIS — K219 Gastro-esophageal reflux disease without esophagitis: Secondary | ICD-10-CM | POA: Diagnosis not present

## 2019-08-08 DIAGNOSIS — R634 Abnormal weight loss: Secondary | ICD-10-CM | POA: Diagnosis not present

## 2019-08-08 DIAGNOSIS — R69 Illness, unspecified: Secondary | ICD-10-CM | POA: Diagnosis not present

## 2019-08-08 DIAGNOSIS — G90511 Complex regional pain syndrome I of right upper limb: Secondary | ICD-10-CM

## 2019-08-08 LAB — VITAMIN D 25 HYDROXY (VIT D DEFICIENCY, FRACTURES): VITD: 98.43 ng/mL (ref 30.00–100.00)

## 2019-08-08 MED ORDER — BUPROPION HCL 75 MG PO TABS: 37.5000 mg | ORAL_TABLET | Freq: Every day | ORAL | 2 refills | Status: DC

## 2019-08-08 MED ORDER — CLONAZEPAM 1 MG PO TABS: 1.0000 mg | ORAL_TABLET | Freq: Every day | ORAL | 1 refills | Status: DC

## 2019-08-08 NOTE — Patient Instructions (Signed)
Shoulder Pain Many things can cause shoulder pain, including:  An injury to the shoulder.  Overuse of the shoulder.  Arthritis. The source of the pain can be:  Inflammation.  An injury to the shoulder joint.  An injury to a tendon, ligament, or bone. Follow these instructions at home: Pay attention to changes in your symptoms. Let your health care provider know about them. Follow these instructions to relieve your pain. If you have a sling:  Wear the sling as told by your health care provider. Remove it only as told by your health care provider.  Loosen the sling if your fingers tingle, become numb, or turn cold and blue.  Keep the sling clean.  If the sling is not waterproof: ? Do not let it get wet. Remove it to shower or bathe.  Move your arm as little as possible, but keep your hand moving to prevent swelling. Managing pain, stiffness, and swelling   If directed, put ice on the painful area: ? Put ice in a plastic bag. ? Place a towel between your skin and the bag. ? Leave the ice on for 20 minutes, 2-3 times per day. Stop applying ice if it does not help with the pain.  Squeeze a soft ball or a foam pad as much as possible. This helps to keep the shoulder from swelling. It also helps to strengthen the arm. General instructions  Take over-the-counter and prescription medicines only as told by your health care provider.  Keep all follow-up visits as told by your health care provider. This is important. Contact a health care provider if:  Your pain gets worse.  Your pain is not relieved with medicines.  New pain develops in your arm, hand, or fingers. Get help right away if:  Your arm, hand, or fingers: ? Tingle. ? Become numb. ? Become swollen. ? Become painful. ? Turn white or blue. Summary  Shoulder pain can be caused by an injury, overuse, or arthritis.  Pay attention to changes in your symptoms. Let your health care provider know about them.   This condition may be treated with a sling, ice, and pain medicines.  Contact your health care provider if the pain gets worse or new pain develops. Get help right away if your arm, hand, or fingers tingle or become numb, swollen, or painful.  Keep all follow-up visits as told by your health care provider. This is important. This information is not intended to replace advice given to you by your health care provider. Make sure you discuss any questions you have with your health care provider. Document Released: 08/06/2005 Document Revised: 05/11/2018 Document Reviewed: 05/11/2018 [employer] Patient Education  2020 [employer]  

## 2019-08-08 NOTE — Progress Notes (Signed)
Subjective:    Patient ID: Stephanie Barnes, female    DOB: 1950-11-16, 68 y.o.   MRN: [id number]  No chief complaint on file.   HPI Patient is in today for follow up  On hyperlipidemia, frozen shoulder, and more. She has been seeing physical therapy for her reflex symrathetic. She notes weakness and pain. Denies CP/palp/SOB/HA/congestion/fevers/GI or GU c/o. Taking meds as prescribed Past Medical History:  Diagnosis Date  . Abnormal EKG 06/13/2013  . Anemia 01/13/2016  . Anxiety   . Arthritis   . Complex regional pain syndrome 01/01/2016  . Depression   . 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 Distal radius fracture, left   . 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 Medicare annual wellness visit, subsequent 10/02/2015  . Multiple allergies 03/31/2016  . Proximal humerus fracture    Left  . Rotator cuff tear   . 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 Welcome to Medicare preventive visit 10/02/2015    Past Surgical History:  Procedure Laterality Date  . DENTAL SURGERY     Root cleaning   . LASIK    . MOUTH SURGERY  08/10/2017   front lower  . OPEN REDUCTION INTERNAL FIXATION (ORIF) DISTAL RADIAL FRACTURE Left 03/15/2019   Procedure: OPEN REDUCTION INTERNAL FIXATION (ORIF) DISTAL RADIAL FRACTURE;  Surgeon: Roseanne Kaufman, MD;  Location: [facility];  Service: Orthopedics;  Laterality: Left;  . REFRACTIVE SURGERY Bilateral 2000  . REVERSE SHOULDER ARTHROPLASTY Left 03/15/2019   Procedure: REVERSE SHOULDER ARTHROPLASTY;  Surgeon: Nicholes Stairs, MD;  Location: [facility];  Service: Orthopedics;  Laterality: Left;  279min  . ROTATOR CUFF REPAIR Right 2017  . TONSILLECTOMY      Family History  Problem Relation Age of Onset  . Heart disease Mother   . Arthritis Mother   . Heart disease Father   . COPD Father   . Mental illness Brother        bipolar  . Hypertension Brother   . Suicidality Brother   . Alcohol abuse Brother   . Heart disease Maternal  Aunt   . Heart disease Maternal Uncle   . Heart disease Paternal Aunt   . COPD Paternal 63   . Heart disease Paternal Uncle   . Heart disease Maternal Grandmother   . Heart disease Maternal Grandfather   . Heart disease Paternal Grandmother   . Heart disease Paternal Grandfather     Social History   Socioeconomic History  . Marital status: Single    Spouse name: Not on file  . Number of children: Not on file  . Years of education: Not on file  . Highest education level: Not on file  Occupational History  . Not on file  Social Needs  . Financial resource strain: Not on file  . Food insecurity    Worry: Not on file    Inability: Not on file  . Transportation needs    Medical: Not on file    Non-medical: Not on file  Tobacco Use  . Smoking status: Never Smoker  . Smokeless tobacco: Never Used  Substance and Sexual Activity  . Alcohol use: Yes    Alcohol/week: 1.0 standard drinks    Types: 1 Glasses of wine per week    Comment: per week  . Drug use: No  . Sexual activity: Not Currently    Comment: lives alone, retired from [employer], no dietary restrictions  Lifestyle  . Physical  activity    Days per week: Not on file    Minutes per session: Not on file  . Stress: Not on file  Relationships  . Social Herbalist on phone: Not on file    Gets together: Not on file    Attends religious service: Not on file    Active member of club or organization: Not on file    Attends meetings of clubs or organizations: Not on file    Relationship status: Not on file  . Intimate partner violence    Fear of current or ex partner: Not on file    Emotionally abused: Not on file    Physically abused: Not on file    Forced sexual activity: Not on file  Other Topics Concern  . Not on file  Social History Narrative  . Not on file    Outpatient Medications Prior to Visit  Medication Sig Dispense Refill  . Ascorbic Acid (VITAMIN C) 1000 MG tablet Take 1,000 mg  by mouth daily.    . cetirizine (ZYRTEC) 10 MG tablet Take 10 mg by mouth daily as needed for allergies.     . Cyanocobalamin (VITAMIN B 12 PO) Take 1,000 mg by mouth daily.     Marland Kitchen ECHINACEA PO Take 1,200 mg by mouth 2 (two) times daily.    Marland Kitchen HYDROmorphone (DILAUDID) 2 MG tablet Take 1-2 tablets (2-4 mg total) by mouth every 4 (four) hours as needed for moderate pain or severe pain (2 mg for moderate pain and 4 mg for severe pain.). 50 tablet 0  . mometasone (ELOCON) 0.1 % ointment Apply topically daily. As needed 15 g 0  . Omega-3 Fatty Acids (SUPER OMEGA-3 PO) Take 1,200 mg by mouth 2 (two) times daily.    . ondansetron (ZOFRAN ODT) 4 MG disintegrating tablet Take 1 tablet (4 mg total) by mouth every 8 (eight) hours as needed. 20 tablet 0  . OVER THE COUNTER MEDICATION Apply 1 application topically as needed (pain). Hemp lotion    . OVER THE COUNTER MEDICATION Take 500 mg by mouth daily as needed (pain). CBD oil    . polyvinyl alcohol (LIQUIFILM TEARS) 1.4 % ophthalmic solution Place 1 drop into both eyes as needed for dry eyes.    . Probiotic Product (PROBIOTIC DAILY PO) Take 1 tablet by mouth daily. NOW 10    . TURMERIC PO Take 894 mg by mouth 2 (two) times daily. GAIA brand-Take 1 in the morning and 1 at night.    Marland Kitchen buPROPion (WELLBUTRIN) 75 MG tablet Take 1/2 (one-half) tablet by mouth once daily 30 tablet 3  . clonazePAM (KLONOPIN) 1 MG tablet Take 1 tablet (1 mg total) by mouth at bedtime. 90 tablet 1  . Calcium Carbonate-Vit D-Min (CALCIUM 1200 PO) Take 1 tablet by mouth 2 (two) times daily.    Marland Kitchen gabapentin (NEURONTIN) 300 MG capsule Take 1 capsule (300 mg total) by mouth 3 (three) times daily for 21 days. 63 capsule 0   No facility-administered medications prior to visit.     Allergies  Allergen Reactions  . Aspirin Anaphylaxis and Swelling    Swelling of lips and fingers   . Celebrex [Celecoxib] Anaphylaxis, Hives and Shortness Of Breath  . Doxycycline Other (See Comments)     Scarred throat   . Prednisone Shortness Of Breath and Other (See Comments)    Chest tightness   . Fosamax [Alendronate Sodium] Hives  . Other Hives and Nausea Only  Arthritec  . Amitriptyline     UNSPECIFIED REACTION   . Chlorhexidine     UNSPECIFIED REACTION   . Celexa [Citalopram] Other (See Comments)    Shakiness   . Clindamycin/Lincomycin Nausea And Vomiting  . Tape Itching and Other (See Comments)    UNSPECIFIED REACTION  Paper tape is okay to use per patient    Review of Systems  Constitutional: Negative for fever and malaise/fatigue.  HENT: Negative for congestion.   Eyes: Negative for blurred vision.  Respiratory: Negative for shortness of breath.   Cardiovascular: Negative for chest pain, palpitations and leg swelling.  Gastrointestinal: Negative for abdominal pain, blood in stool and nausea.  Genitourinary: Negative for dysuria and frequency.  Musculoskeletal: Positive for myalgias. Negative for falls.  Skin: Negative for rash.  Neurological: Positive for sensory change and weakness. Negative for dizziness, loss of consciousness and headaches.  Endo/Heme/Allergies: Negative for environmental allergies.  Psychiatric/Behavioral: Negative for depression. The patient is not nervous/anxious.        Objective:    Physical Exam Vitals signs and nursing note reviewed.  Constitutional:      General: She is not in acute distress.    Appearance: She is well-developed.  HENT:     Head: Normocephalic and atraumatic.     Nose: Nose normal.  Eyes:     General:        Right eye: No discharge.        Left eye: No discharge.  Neck:     Musculoskeletal: Normal range of motion and neck supple.  Cardiovascular:     Rate and Rhythm: Normal rate and regular rhythm.     Heart sounds: No murmur.  Pulmonary:     Effort: Pulmonary effort is normal.     Breath sounds: Normal breath sounds.  Abdominal:     General: Bowel sounds are normal.     Palpations: Abdomen is soft.      Tenderness: There is no abdominal tenderness.  Musculoskeletal:        General: Swelling present.     Right lower leg: Edema present.     Left lower leg: No edema.     Comments: Slight swelling in right lower arm and left upper arm and swelling at right lower leg.   Skin:    General: Skin is warm and dry.     Findings: Lesion present.  Neurological:     Mental Status: She is alert and oriented to person, place, and time.     BP (!) 106/56 (BP Location: Left Arm, Patient Position: Sitting, Cuff Size: Normal)   Pulse 67   Temp 98 F (36.7 C) (Temporal)   Resp 18   Wt 114 lb (51.7 kg)   SpO2 97%   BMI 22.45 kg/m  Wt Readings from Last 3 Encounters:  08/08/19 114 lb (51.7 kg)  03/15/19 125 lb (56.7 kg)  03/10/19 125 lb (56.7 kg)    Diabetic Foot Exam - Simple   No data filed     Lab Results  Component Value Date   WBC 4.5 07/27/2019   HGB 12.0 07/27/2019   HCT 36.8 07/27/2019   PLT 189.0 07/27/2019   GLUCOSE 83 07/27/2019   CHOL 177 07/27/2019   TRIG 108.0 07/27/2019   HDL 68.20 07/27/2019   LDLCALC 87 07/27/2019   ALT 10 07/27/2019   AST 13 07/27/2019   NA 139 07/27/2019   K 4.1 07/27/2019   CL 102 07/27/2019   CREATININE 0.84 07/27/2019  BUN 22 07/27/2019   CO2 30 07/27/2019   TSH 2.41 07/27/2019    Lab Results  Component Value Date   TSH 2.41 07/27/2019   Lab Results  Component Value Date   WBC 4.5 07/27/2019   HGB 12.0 07/27/2019   HCT 36.8 07/27/2019   MCV 88.5 07/27/2019   PLT 189.0 07/27/2019   Lab Results  Component Value Date   NA 139 07/27/2019   K 4.1 07/27/2019   CO2 30 07/27/2019   GLUCOSE 83 07/27/2019   BUN 22 07/27/2019   CREATININE 0.84 07/27/2019   BILITOT 0.4 07/27/2019   ALKPHOS 62 07/27/2019   AST 13 07/27/2019   ALT 10 07/27/2019   PROT 6.5 07/27/2019   ALBUMIN 4.0 07/27/2019   CALCIUM 9.6 07/27/2019   ANIONGAP 14 03/10/2019   GFR 67.22 07/27/2019   Lab Results  Component Value Date   CHOL 177 07/27/2019    Lab Results  Component Value Date   HDL 68.20 07/27/2019   Lab Results  Component Value Date   LDLCALC 87 07/27/2019   Lab Results  Component Value Date   TRIG 108.0 07/27/2019   Lab Results  Component Value Date   CHOLHDL 3 07/27/2019   No results found for: HGBA1C     Assessment & Plan:   Problem List Items Addressed This Visit    Complex regional pain syndrome type I (Location of Primary Source of Pain) (upper extremity) (Right) (Chronic)    Is following with [facility] Dr Victorino December did surgery on her shoulder and Dr Amedeo Plenty did her wrist surgery. She does PT regularly and her pain meds help her through the day.       Relevant Medications   clonazePAM (KLONOPIN) 1 MG tablet   buPROPion (WELLBUTRIN) 75 MG tablet   Depression with anxiety    The pandemic is taking it's toll on her stress level but she is managing well most days. Is given refills on her Wellbutrin and Clonazapam       Relevant Medications   buPROPion (WELLBUTRIN) 75 MG tablet   Hyperlipidemia    Encouraged heart healthy diet, increase exercise, avoid trans fats, consider a krill oil cap daily      Vitamin D deficiency    Was over treated and her supplements were stopped at last check, recheck today       Other Visit Diagnoses    High vitamin D level    -  Primary   Relevant Orders   VITAMIN D 25 Hydroxy (Vit-D Deficiency, Fractures)      I have changed Stephanie Barnes. Stephanie Barnes buPROPion. I am also having her maintain her Probiotic Product (PROBIOTIC DAILY PO), Calcium Carbonate-Vit D-Min (CALCIUM 1200 PO), cetirizine, mometasone, Cyanocobalamin (VITAMIN B 12 PO), vitamin C, TURMERIC PO, Omega-3 Fatty Acids (SUPER OMEGA-3 PO), ECHINACEA PO, OVER THE COUNTER MEDICATION, OVER THE COUNTER MEDICATION, polyvinyl alcohol, HYDROmorphone, gabapentin, ondansetron, and clonazePAM.  Meds ordered this encounter  Medications  . clonazePAM (KLONOPIN) 1 MG tablet    Sig: Take 1 tablet (1 mg total) by mouth at  bedtime.    Dispense:  90 tablet    Refill:  1  . buPROPion (WELLBUTRIN) 75 MG tablet    Sig: Take 0.5 tablets (37.5 mg total) by mouth daily.    Dispense:  45 tablet    Refill:  2     Penni Homans, MD

## 2019-08-08 NOTE — Assessment & Plan Note (Signed)
Encouraged heart healthy diet, increase exercise, avoid trans fats, consider a krill oil cap daily 

## 2019-08-08 NOTE — Assessment & Plan Note (Signed)
The pandemic is taking it's toll on her stress level but she is managing well most days. Is given refills on her Wellbutrin and Clonazapam

## 2019-08-08 NOTE — Assessment & Plan Note (Signed)
asymptomatic

## 2019-08-08 NOTE — Assessment & Plan Note (Signed)
Lost about 10 pounds over a couple of months. No significant dietary changes. She has been more active since her surgery. encouraged to up protein intake and monitor. She does feel it has stabilized and was at it's worse right after her shoulder surgery.

## 2019-08-08 NOTE — Assessment & Plan Note (Addendum)
Is following with [facility] Dr Victorino December did surgery on her shoulder and Dr Amedeo Plenty did her wrist surgery. She does PT regularly and her pain meds help her through the day.

## 2019-08-08 NOTE — Assessment & Plan Note (Signed)
Was over treated and her supplements were stopped at last check, recheck today

## 2019-08-11 NOTE — Progress Notes (Signed)
Virtual Visit via Video Note  I connected with patient on 08/12/19 at  1:00 PM EDT by audio enabled telemedicine application and verified that I am speaking with the correct person using two identifiers.   THIS ENCOUNTER IS A VIRTUAL VISIT DUE TO COVID-19 - PATIENT WAS NOT SEEN IN THE OFFICE. PATIENT HAS CONSENTED TO VIRTUAL VISIT / TELEMEDICINE VISIT   Location of patient: home  Location of provider: office  I discussed the limitations of evaluation and management by telemedicine and the availability of in person appointments. The patient expressed understanding and agreed to proceed.   Subjective:   Stephanie Barnes is a 68 y.o. female who presents for Medicare Annual (Subsequent) preventive examination.  Pt struggles with CRPS. She reports she is diligent with doing her exercises to improve shoulder strength and mobility. She really enjoys being able to play golf.   Review of Systems:  Cardiac Risk Factors include: advanced age (>60men, >77 women);dyslipidemia   Home Safety/Smoke Alarms: Feels safe in home. Smoke alarms in place.  Lives alone. Has indoor cat. No stairs. Walk in shower w/ bench.  Female:       Mammo-  Declines  today   Dexa scan- declines today        CCS- 03/2014. Recall 5-10 yrs.      Objective:     Vitals: Unable to assess. This visit is enabled though telemedicine due to Covid 19.   Advanced Directives 08/12/2019 03/15/2019 03/10/2019 04/01/2018 03/30/2017 09/04/2016 12/26/2015  Does Patient Have a Medical Advance Directive? Yes Yes Yes Yes Yes Yes No  Type of Paramedic of [place];Living will [place];Living will [place];Living will [place];Living will [place];Living will Living will -  Does patient want to make changes to medical advance directive? No - Patient declined No - Patient declined No - Patient declined No - Patient declined No - Patient  declined No - Patient declined -  Copy of [facility] in Chart? Yes - validated most recent copy scanned in chart (See row information) - No - copy requested Yes No - copy requested No - copy requested -  Would patient like information on creating a medical advance directive? - - - - - - No - patient declined information    Tobacco Social History   Tobacco Use  Smoking Status Never Smoker  Smokeless Tobacco Never Used     Counseling given: Not Answered   Clinical Intake: Pain : No/denies pain     Past Medical History:  Diagnosis Date  . Abnormal EKG 06/13/2013  . Anemia 01/13/2016  . Anxiety   . Arthritis   . Complex regional pain syndrome 01/01/2016  . Depression   . 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 Distal radius fracture, left   . 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 Medicare annual wellness visit, subsequent 10/02/2015  . Multiple allergies 03/31/2016  . Proximal humerus fracture    Left  . Rotator cuff tear   . 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 Welcome to Medicare preventive visit 10/02/2015   Past Surgical History:  Procedure Laterality Date  . DENTAL SURGERY     Root cleaning   . LASIK    . MOUTH SURGERY  08/10/2017   front lower  . OPEN REDUCTION INTERNAL FIXATION (ORIF) DISTAL RADIAL FRACTURE Left 03/15/2019   Procedure: OPEN REDUCTION INTERNAL FIXATION (ORIF) DISTAL  RADIAL FRACTURE;  Surgeon: Roseanne Kaufman, MD;  Location: [place];  Service: Orthopedics;  Laterality: Left;  . REFRACTIVE SURGERY Bilateral 2000  . REVERSE SHOULDER ARTHROPLASTY Left 03/15/2019   Procedure: REVERSE SHOULDER ARTHROPLASTY;  Surgeon: Nicholes Stairs, MD;  Location: [place];  Service: Orthopedics;  Laterality: Left;  236min  . ROTATOR CUFF REPAIR Right 2017  . TONSILLECTOMY     Family History  Problem Relation Age of Onset  . Heart disease Mother   . Arthritis Mother   . Heart disease Father   . COPD Father   . Mental  illness Brother        bipolar  . Hypertension Brother   . Suicidality Brother   . Alcohol abuse Brother   . Heart disease Maternal Aunt   . Heart disease Maternal Uncle   . Heart disease Paternal Aunt   . COPD Paternal 73   . Heart disease Paternal Uncle   . Heart disease Maternal Grandmother   . Heart disease Maternal Grandfather   . Heart disease Paternal Grandmother   . Heart disease Paternal Grandfather    Social History   Socioeconomic History  . Marital status: Single    Spouse name: Not on file  . Number of children: Not on file  . Years of education: Not on file  . Highest education level: Not on file  Occupational History  . Not on file  Social Needs  . Financial resource strain: Not on file  . Food insecurity    Worry: Not on file    Inability: Not on file  . Transportation needs    Medical: Not on file    Non-medical: Not on file  Tobacco Use  . Smoking status: Never Smoker  . Smokeless tobacco: Never Used  Substance and Sexual Activity  . Alcohol use: Yes    Alcohol/week: 1.0 standard drinks    Types: 1 Glasses of wine per week    Comment: per week  . Drug use: No  . Sexual activity: Not Currently    Comment: lives alone, retired from [employer], no dietary restrictions  Lifestyle  . Physical activity    Days per week: Not on file    Minutes per session: Not on file  . Stress: Not on file  Relationships  . Social Herbalist on phone: Not on file    Gets together: Not on file    Attends religious service: Not on file    Active member of club or organization: Not on file    Attends meetings of clubs or organizations: Not on file    Relationship status: Not on file  Other Topics Concern  . Not on file  Social History Narrative  . Not on file    Outpatient Encounter Medications as of 08/12/2019  Medication Sig  . Ascorbic Acid (VITAMIN C) 1000 MG tablet Take 1,000 mg by mouth daily.  Marland Kitchen buPROPion (WELLBUTRIN) 75 MG tablet  Take 0.5 tablets (37.5 mg total) by mouth daily.  . Calcium Carbonate-Vit D-Min (CALCIUM 1200 PO) Take 1 tablet by mouth 2 (two) times daily.  . cetirizine (ZYRTEC) 10 MG tablet Take 10 mg by mouth daily as needed for allergies.   . clonazePAM (KLONOPIN) 1 MG tablet Take 1 tablet (1 mg total) by mouth at bedtime.  . Cyanocobalamin (VITAMIN B 12 PO) Take 1,000 mg by mouth daily.   Marland Kitchen ECHINACEA PO Take 1,200 mg by mouth 2 (two) times daily.  Marland Kitchen HYDROmorphone (  DILAUDID) 2 MG tablet Take 1-2 tablets (2-4 mg total) by mouth every 4 (four) hours as needed for moderate pain or severe pain (2 mg for moderate pain and 4 mg for severe pain.).  Marland Kitchen mometasone (ELOCON) 0.1 % ointment Apply topically daily. As needed  . Omega-3 Fatty Acids (SUPER OMEGA-3 PO) Take 1,200 mg by mouth 2 (two) times daily.  . ondansetron (ZOFRAN ODT) 4 MG disintegrating tablet Take 1 tablet (4 mg total) by mouth every 8 (eight) hours as needed.  Marland Kitchen OVER THE COUNTER MEDICATION Apply 1 application topically as needed (pain). Hemp lotion  . OVER THE COUNTER MEDICATION Take 500 mg by mouth daily as needed (pain). CBD oil  . polyvinyl alcohol (LIQUIFILM TEARS) 1.4 % ophthalmic solution Place 1 drop into both eyes as needed for dry eyes.  . Probiotic Product (PROBIOTIC DAILY PO) Take 1 tablet by mouth daily. NOW 10  . TURMERIC PO Take 894 mg by mouth 2 (two) times daily. GAIA brand-Take 1 in the morning and 1 at night.  . gabapentin (NEURONTIN) 300 MG capsule Take 1 capsule (300 mg total) by mouth 3 (three) times daily for 21 days.   No facility-administered encounter medications on file as of 08/12/2019.     Activities of Daily Living In your present state of health, do you have any difficulty performing the following activities: 08/12/2019 03/15/2019  Hearing? N N  Vision? N N  Difficulty concentrating or making decisions? N N  Walking or climbing stairs? N N  Dressing or bathing? N Y  Doing errands, shopping? N N  Preparing Food and  eating ? N -  Using the Toilet? N -  In the past six months, have you accidently leaked urine? N -  Do you have problems with loss of bowel control? N -  Managing your Medications? N -  Managing your Finances? N -  Housekeeping or managing your Housekeeping? N -  Some recent data might be hidden    Patient Care Team: Mosie Lukes, MD as PCP - General (Family Medicine) Juanita Craver, MD as Consulting Physician (Gastroenterology) Percival Spanish, PT as Physical Therapist (Physical Therapy) Susa Day, MD as Consulting Physician (Orthopedic Surgery)    Assessment:   This is a routine wellness examination for [place]. Physical assessment deferred to PCP.  Exercise Activities and Dietary recommendations Current Exercise Habits: Home exercise routine, Type of exercise: stretching(PT exercises), Time (Minutes): 30, Intensity: Mild, Exercise limited by: None identified Diet (meal preparation, eat out, water intake, caffeinated beverages, dairy products, fruits and vegetables): Extremely healthy with lots of fruits and vegetables.    Goals    . Be socially active. (pt-stated)    . Maintain healthy eating lifestyle. (pt-stated)       Fall Risk Fall Risk  08/12/2019 04/01/2018 03/30/2017 09/04/2016 09/13/2015  Falls in the past year? 1 No No No No  Number falls in past yr: 0 - - - -  Injury with Fall? 1 - - - -    no Depression Screen PHQ 2/9 Scores 08/12/2019 04/01/2018 03/30/2017 09/04/2016  PHQ - 2 Score 0 0 1 0     Cognitive Function Ad8 score reviewed for issues:  Issues making decisions:no  Less interest in hobbies / activities:no  Repeats questions, stories (family complaining):no  Trouble using ordinary gadgets (microwave, computer, phone):no  Forgets the month or year:no   Mismanaging finances: no  Remembering appts: no  Daily problems with thinking and/or memory:no Ad8 score is=0  Immunization History  Administered Date(s) Administered  .  Influenza, High Dose Seasonal PF 08/03/2018, 07/11/2019  . Influenza, Seasonal, Injecte, Preservative Fre 12/02/2012  . Influenza,inj,Quad PF,6+ Mos 07/31/2014, 10/02/2015, 09/19/2016  . Influenza-Unspecified 08/12/2017  . Pneumococcal Conjugate-13 10/02/2015  . Pneumococcal Polysaccharide-23 05/14/2017  . Zoster 12/02/2012    Screening Tests Health Maintenance  Topic Date Due  . HEMOGLOBIN A1C  [birth year]  . FOOT EXAM  07/17/1960  . OPHTHALMOLOGY EXAM  07/17/1960  . URINE MICROALBUMIN  07/17/1960  . MAMMOGRAM  05/25/2020  . TETANUS/TDAP  06/14/2023  . COLONOSCOPY  03/15/2024  . INFLUENZA VACCINE  Completed  . DEXA SCAN  Completed  . Hepatitis C Screening  Completed  . PNA vac Low Risk Adult  Completed      Plan:   See you next year!  Keep up the great work!  I have personally reviewed and noted the following in the patient's chart:   . Medical and social history . Use of alcohol, tobacco or illicit drugs  . Current medications and supplements . Functional ability and status . Nutritional status . Physical activity . Advanced directives . List of other physicians . Hospitalizations, surgeries, and ER visits in previous 12 months . Vitals . Screenings to include cognitive, depression, and falls . Referrals and appointments  In addition, I have reviewed and discussed with patient certain preventive protocols, quality metrics, and best practice recommendations. A written personalized care plan for preventive services as well as general preventive health recommendations were provided to patient.     Shela Nevin, [place]  08/12/2019

## 2019-08-12 ENCOUNTER — Other Ambulatory Visit: Payer: Self-pay

## 2019-08-12 ENCOUNTER — Encounter: Payer: Self-pay | Admitting: *Deleted

## 2019-08-12 ENCOUNTER — Ambulatory Visit (INDEPENDENT_AMBULATORY_CARE_PROVIDER_SITE_OTHER): Payer: Medicare HMO | Admitting: *Deleted

## 2019-08-12 DIAGNOSIS — M25642 Stiffness of left hand, not elsewhere classified: Secondary | ICD-10-CM | POA: Diagnosis not present

## 2019-08-12 DIAGNOSIS — M25612 Stiffness of left shoulder, not elsewhere classified: Secondary | ICD-10-CM | POA: Diagnosis not present

## 2019-08-12 DIAGNOSIS — Z0000 Encounter for general adult medical examination without abnormal findings: Secondary | ICD-10-CM

## 2019-08-12 NOTE — Patient Instructions (Signed)
See you next year!  Keep up the great work!   Stephanie Barnes , Thank you for taking time to come for your Medicare Wellness Visit. I appreciate your ongoing commitment to your health goals. Please review the following plan we discussed and let me know if I can assist you in the future.   These are the goals we discussed: Goals    . Be socially active. (pt-stated)    . Maintain healthy eating lifestyle. (pt-stated)       This is a list of the screening recommended for you and due dates:  Health Maintenance  Topic Date Due  . Hemoglobin A1C  1951-07-23  . Complete foot exam   07/17/1960  . Eye exam for diabetics  07/17/1960  . Urine Protein Check  07/17/1960  . Mammogram  05/25/2020  . Tetanus Vaccine  06/14/2023  . Colon Cancer Screening  03/15/2024  . Flu Shot  Completed  . DEXA scan (bone density measurement)  Completed  .  Hepatitis C: One time screening is recommended by Center for Disease Control  (CDC) for  adults born from [birth year] through 1966.   Completed  . Pneumonia vaccines  Completed    Health Maintenance After Age 26 After age 74, you are at a higher risk for certain long-term diseases and infections as well as injuries from falls. Falls are a major cause of broken bones and head injuries in people who are older than age 13. Getting regular preventive care can help to keep you healthy and well. Preventive care includes getting regular testing and making lifestyle changes as recommended by your health care provider. Talk with your health care provider about:  Which screenings and tests you should have. A screening is a test that checks for a disease when you have no symptoms.  A diet and exercise plan that is right for you. What should I know about screenings and tests to prevent falls? Screening and testing are the best ways to find a health problem early. Early diagnosis and treatment give you the best chance of managing medical conditions that are common after age 34.  Certain conditions and lifestyle choices may make you more likely to have a fall. Your health care provider may recommend:  Regular vision checks. Poor vision and conditions such as cataracts can make you more likely to have a fall. If you wear glasses, make sure to get your prescription updated if your vision changes.  Medicine review. Work with your health care provider to regularly review all of the medicines you are taking, including over-the-counter medicines. Ask your health care provider about any side effects that may make you more likely to have a fall. Tell your health care provider if any medicines that you take make you feel dizzy or sleepy.  Osteoporosis screening. Osteoporosis is a condition that causes the bones to get weaker. This can make the bones weak and cause them to break more easily.  Blood pressure screening. Blood pressure changes and medicines to control blood pressure can make you feel dizzy.  Strength and balance checks. Your health care provider may recommend certain tests to check your strength and balance while standing, walking, or changing positions.  Foot health exam. Foot pain and numbness, as well as not wearing proper footwear, can make you more likely to have a fall.  Depression screening. You may be more likely to have a fall if you have a fear of falling, feel emotionally low, or feel unable to do activities  that you used to do.  Alcohol use screening. Using too much alcohol can affect your balance and may make you more likely to have a fall. What actions can I take to lower my risk of falls? General instructions  Talk with your health care provider about your risks for falling. Tell your health care provider if: ? You fall. Be sure to tell your health care provider about all falls, even ones that seem minor. ? You feel dizzy, sleepy, or off-balance.  Take over-the-counter and prescription medicines only as told by your health care provider. These  include any supplements.  Eat a healthy diet and maintain a healthy weight. A healthy diet includes low-fat dairy products, low-fat (lean) meats, and fiber from whole grains, beans, and lots of fruits and vegetables. Home safety  Remove any tripping hazards, such as rugs, cords, and clutter.  Install safety equipment such as grab bars in bathrooms and safety rails on stairs.  Keep rooms and walkways well-lit. Activity   Follow a regular exercise program to stay fit. This will help you maintain your balance. Ask your health care provider what types of exercise are appropriate for you.  If you need a cane or walker, use it as recommended by your health care provider.  Wear supportive shoes that have nonskid soles. Lifestyle  Do not drink alcohol if your health care provider tells you not to drink.  If you drink alcohol, limit how much you have: ? 0-1 drink a day for women. ? 0-2 drinks a day for men.  Be aware of how much alcohol is in your drink. In the [place] one drink equals one typical bottle of beer (12 oz), one-half glass of wine (5 oz), or one shot of hard liquor (1 oz).  Do not use any products that contain nicotine or tobacco, such as cigarettes and e-cigarettes. If you need help quitting, ask your health care provider. Summary  Having a healthy lifestyle and getting preventive care can help to protect your health and wellness after age 65.  Screening and testing are the best way to find a health problem early and help you avoid having a fall. Early diagnosis and treatment give you the best chance for managing medical conditions that are more common for people who are older than age 10.  Falls are a major cause of broken bones and head injuries in people who are older than age 80. Take precautions to prevent a fall at home.  Work with your health care provider to learn what changes you can make to improve your health and wellness and to prevent falls. This information is  not intended to replace advice given to you by your health care provider. Make sure you discuss any questions you have with your health care provider. Document Released: 09/09/2017 Document Revised: 02/17/2019 Document Reviewed: 09/09/2017 Elsevier Patient Education  2020 [employer].

## 2019-08-15 DIAGNOSIS — M25612 Stiffness of left shoulder, not elsewhere classified: Secondary | ICD-10-CM | POA: Diagnosis not present

## 2019-08-15 DIAGNOSIS — M25642 Stiffness of left hand, not elsewhere classified: Secondary | ICD-10-CM | POA: Diagnosis not present

## 2019-08-18 DIAGNOSIS — M25632 Stiffness of left wrist, not elsewhere classified: Secondary | ICD-10-CM | POA: Diagnosis not present

## 2019-08-18 DIAGNOSIS — M25612 Stiffness of left shoulder, not elsewhere classified: Secondary | ICD-10-CM | POA: Diagnosis not present

## 2019-08-18 DIAGNOSIS — M25642 Stiffness of left hand, not elsewhere classified: Secondary | ICD-10-CM | POA: Diagnosis not present

## 2019-08-22 DIAGNOSIS — S52502D Unspecified fracture of the lower end of left radius, subsequent encounter for closed fracture with routine healing: Secondary | ICD-10-CM | POA: Diagnosis not present

## 2019-08-23 DIAGNOSIS — M25632 Stiffness of left wrist, not elsewhere classified: Secondary | ICD-10-CM | POA: Diagnosis not present

## 2019-08-23 DIAGNOSIS — M25642 Stiffness of left hand, not elsewhere classified: Secondary | ICD-10-CM | POA: Diagnosis not present

## 2019-08-23 DIAGNOSIS — M25612 Stiffness of left shoulder, not elsewhere classified: Secondary | ICD-10-CM | POA: Diagnosis not present

## 2019-08-25 DIAGNOSIS — M25612 Stiffness of left shoulder, not elsewhere classified: Secondary | ICD-10-CM | POA: Diagnosis not present

## 2019-08-25 DIAGNOSIS — M25642 Stiffness of left hand, not elsewhere classified: Secondary | ICD-10-CM | POA: Diagnosis not present

## 2019-08-30 DIAGNOSIS — M25642 Stiffness of left hand, not elsewhere classified: Secondary | ICD-10-CM | POA: Diagnosis not present

## 2019-08-30 DIAGNOSIS — M25632 Stiffness of left wrist, not elsewhere classified: Secondary | ICD-10-CM | POA: Diagnosis not present

## 2019-08-30 DIAGNOSIS — M25612 Stiffness of left shoulder, not elsewhere classified: Secondary | ICD-10-CM | POA: Diagnosis not present

## 2019-09-01 DIAGNOSIS — M25642 Stiffness of left hand, not elsewhere classified: Secondary | ICD-10-CM | POA: Diagnosis not present

## 2019-09-01 DIAGNOSIS — M25612 Stiffness of left shoulder, not elsewhere classified: Secondary | ICD-10-CM | POA: Diagnosis not present

## 2019-09-06 DIAGNOSIS — M25642 Stiffness of left hand, not elsewhere classified: Secondary | ICD-10-CM | POA: Diagnosis not present

## 2019-09-06 DIAGNOSIS — M25612 Stiffness of left shoulder, not elsewhere classified: Secondary | ICD-10-CM | POA: Diagnosis not present

## 2019-09-08 DIAGNOSIS — M25612 Stiffness of left shoulder, not elsewhere classified: Secondary | ICD-10-CM | POA: Diagnosis not present

## 2019-09-08 DIAGNOSIS — M25642 Stiffness of left hand, not elsewhere classified: Secondary | ICD-10-CM | POA: Diagnosis not present

## 2019-09-13 DIAGNOSIS — M25642 Stiffness of left hand, not elsewhere classified: Secondary | ICD-10-CM | POA: Diagnosis not present

## 2019-09-14 DIAGNOSIS — M25612 Stiffness of left shoulder, not elsewhere classified: Secondary | ICD-10-CM | POA: Diagnosis not present

## 2019-09-15 DIAGNOSIS — M25642 Stiffness of left hand, not elsewhere classified: Secondary | ICD-10-CM | POA: Diagnosis not present

## 2019-09-20 DIAGNOSIS — M25632 Stiffness of left wrist, not elsewhere classified: Secondary | ICD-10-CM | POA: Diagnosis not present

## 2019-09-20 DIAGNOSIS — M25532 Pain in left wrist: Secondary | ICD-10-CM | POA: Diagnosis not present

## 2019-09-21 DIAGNOSIS — M25612 Stiffness of left shoulder, not elsewhere classified: Secondary | ICD-10-CM | POA: Diagnosis not present

## 2019-09-22 DIAGNOSIS — M25642 Stiffness of left hand, not elsewhere classified: Secondary | ICD-10-CM | POA: Diagnosis not present

## 2019-09-27 DIAGNOSIS — M25642 Stiffness of left hand, not elsewhere classified: Secondary | ICD-10-CM | POA: Diagnosis not present

## 2019-09-28 DIAGNOSIS — M25612 Stiffness of left shoulder, not elsewhere classified: Secondary | ICD-10-CM | POA: Diagnosis not present

## 2019-09-29 DIAGNOSIS — M25642 Stiffness of left hand, not elsewhere classified: Secondary | ICD-10-CM | POA: Diagnosis not present

## 2019-10-03 DIAGNOSIS — M25532 Pain in left wrist: Secondary | ICD-10-CM | POA: Diagnosis not present

## 2019-10-03 DIAGNOSIS — M7502 Adhesive capsulitis of left shoulder: Secondary | ICD-10-CM | POA: Diagnosis not present

## 2019-10-03 DIAGNOSIS — S52502D Unspecified fracture of the lower end of left radius, subsequent encounter for closed fracture with routine healing: Secondary | ICD-10-CM | POA: Diagnosis not present

## 2019-10-04 DIAGNOSIS — M25612 Stiffness of left shoulder, not elsewhere classified: Secondary | ICD-10-CM | POA: Diagnosis not present

## 2019-10-05 DIAGNOSIS — M25642 Stiffness of left hand, not elsewhere classified: Secondary | ICD-10-CM | POA: Diagnosis not present

## 2019-10-11 DIAGNOSIS — M25642 Stiffness of left hand, not elsewhere classified: Secondary | ICD-10-CM | POA: Diagnosis not present

## 2019-10-12 DIAGNOSIS — M25612 Stiffness of left shoulder, not elsewhere classified: Secondary | ICD-10-CM | POA: Diagnosis not present

## 2019-10-13 DIAGNOSIS — M25642 Stiffness of left hand, not elsewhere classified: Secondary | ICD-10-CM | POA: Diagnosis not present

## 2019-10-18 DIAGNOSIS — M25642 Stiffness of left hand, not elsewhere classified: Secondary | ICD-10-CM | POA: Diagnosis not present

## 2019-10-19 DIAGNOSIS — M25612 Stiffness of left shoulder, not elsewhere classified: Secondary | ICD-10-CM | POA: Diagnosis not present

## 2019-10-19 DIAGNOSIS — M7502 Adhesive capsulitis of left shoulder: Secondary | ICD-10-CM | POA: Diagnosis not present

## 2019-10-20 DIAGNOSIS — M25642 Stiffness of left hand, not elsewhere classified: Secondary | ICD-10-CM | POA: Diagnosis not present

## 2019-10-25 DIAGNOSIS — M25642 Stiffness of left hand, not elsewhere classified: Secondary | ICD-10-CM | POA: Diagnosis not present

## 2019-10-26 DIAGNOSIS — M25612 Stiffness of left shoulder, not elsewhere classified: Secondary | ICD-10-CM | POA: Diagnosis not present

## 2019-10-27 DIAGNOSIS — M25642 Stiffness of left hand, not elsewhere classified: Secondary | ICD-10-CM | POA: Diagnosis not present

## 2019-11-02 DIAGNOSIS — M25612 Stiffness of left shoulder, not elsewhere classified: Secondary | ICD-10-CM | POA: Diagnosis not present

## 2019-11-03 ENCOUNTER — Ambulatory Visit (INDEPENDENT_AMBULATORY_CARE_PROVIDER_SITE_OTHER): Payer: Medicare HMO | Admitting: Family Medicine

## 2019-11-03 ENCOUNTER — Encounter: Payer: Self-pay | Admitting: Family Medicine

## 2019-11-03 ENCOUNTER — Other Ambulatory Visit: Payer: Self-pay

## 2019-11-03 DIAGNOSIS — G4700 Insomnia, unspecified: Secondary | ICD-10-CM | POA: Diagnosis not present

## 2019-11-03 DIAGNOSIS — Z889 Allergy status to unspecified drugs, medicaments and biological substances status: Secondary | ICD-10-CM

## 2019-11-03 DIAGNOSIS — E782 Mixed hyperlipidemia: Secondary | ICD-10-CM | POA: Diagnosis not present

## 2019-11-03 DIAGNOSIS — M8580 Other specified disorders of bone density and structure, unspecified site: Secondary | ICD-10-CM | POA: Diagnosis not present

## 2019-11-03 DIAGNOSIS — G90511 Complex regional pain syndrome I of right upper limb: Secondary | ICD-10-CM

## 2019-11-03 DIAGNOSIS — M25532 Pain in left wrist: Secondary | ICD-10-CM | POA: Diagnosis not present

## 2019-11-03 DIAGNOSIS — E559 Vitamin D deficiency, unspecified: Secondary | ICD-10-CM

## 2019-11-07 ENCOUNTER — Ambulatory Visit: Payer: Medicare HMO | Admitting: Family Medicine

## 2019-11-07 NOTE — Assessment & Plan Note (Signed)
Encouraged heart healthy diet, increase exercise, avoid trans fats, consider a krill oil cap daily 

## 2019-11-07 NOTE — Assessment & Plan Note (Signed)
She is maintaining quarantine well except to keep up with her  Physical therapy so she does not back slide.

## 2019-11-07 NOTE — Assessment & Plan Note (Signed)
Encouraged to get adequate exercise, calcium and vitamin d intake 

## 2019-11-07 NOTE — Assessment & Plan Note (Signed)
Cetirizine and Flonase prn

## 2019-11-07 NOTE — Assessment & Plan Note (Signed)
Encouraged good sleep hygiene such as dark, quiet room. No blue/green glowing lights such as computer screens in bedroom. No alcohol or stimulants in evening. Cut down on caffeine as able. Regular exercise is helpful but not just prior to bed time.  

## 2019-11-07 NOTE — Progress Notes (Signed)
Virtual Visit via Video Note  I connected with Stephanie Barnes on 11/03/19 at  9:20 AM EST by a video enabled telemedicine application and verified that I am speaking with the correct person using two identifiers.  Location: Patient: home Provider: home   I discussed the limitations of evaluation and management by telemedicine and the availability of in person appointments. The patient expressed understanding and agreed to proceed. Magdalene Molly, CMA was able to get the patient set up on a visit,    Subjective:    Patient ID: Stephanie Barnes, female    DOB: [birth year], 68 y.o.   MRN: [id number]  No chief complaint on file.   HPI Patient is in today for follow up on chronic medical concerns including her complex regional pain syndrome. She continues with frequent physicall therapy appointments. She is otherwise maintaining quarantine well. Her arm is improving slowly but she does not feel like she can afford to miss an appt. Denies CP/palp/SOB/HA/congestion/fevers/GI or GU c/o. Taking meds as prescribed  Past Medical History:  Diagnosis Date  . Abnormal EKG 06/13/2013  . Anemia 01/13/2016  . Anxiety   . Arthritis   . Complex regional pain syndrome 01/01/2016  . Depression   . 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 Distal radius fracture, left   . 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 Medicare annual wellness visit, subsequent 10/02/2015  . Multiple allergies 03/31/2016  . Proximal humerus fracture    Left  . Rotator cuff tear   . 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 Welcome to Medicare preventive visit 10/02/2015    Past Surgical History:  Procedure Laterality Date  . DENTAL SURGERY     Root cleaning   . LASIK    . MOUTH SURGERY  08/10/2017   front lower  . OPEN REDUCTION INTERNAL FIXATION (ORIF) DISTAL RADIAL FRACTURE Left 03/15/2019   Procedure: OPEN REDUCTION INTERNAL FIXATION (ORIF) DISTAL RADIAL FRACTURE;  Surgeon: Roseanne Kaufman, MD;   Location: [place];  Service: Orthopedics;  Laterality: Left;  . REFRACTIVE SURGERY Bilateral 2000  . REVERSE SHOULDER ARTHROPLASTY Left 03/15/2019   Procedure: REVERSE SHOULDER ARTHROPLASTY;  Surgeon: Nicholes Stairs, MD;  Location: [place];  Service: Orthopedics;  Laterality: Left;  261min  . ROTATOR CUFF REPAIR Right 2017  . TONSILLECTOMY      Family History  Problem Relation Age of Onset  . Heart disease Mother   . Arthritis Mother   . Heart disease Father   . COPD Father   . Mental illness Brother        bipolar  . Hypertension Brother   . Suicidality Brother   . Alcohol abuse Brother   . Heart disease Maternal Aunt   . Heart disease Maternal Uncle   . Heart disease Paternal Aunt   . COPD Paternal 80   . Heart disease Paternal Uncle   . Heart disease Maternal Grandmother   . Heart disease Maternal Grandfather   . Heart disease Paternal Grandmother   . Heart disease Paternal Grandfather     Social History   Socioeconomic History  . Marital status: Single    Spouse name: Not on file  . Number of children: Not on file  . Years of education: Not on file  . Highest education level: Not on file  Occupational History  . Not on file  Tobacco Use  . Smoking status: Never Smoker  . Smokeless tobacco: Never Used  Substance  and Sexual Activity  . Alcohol use: Yes    Alcohol/week: 1.0 standard drinks    Types: 1 Glasses of wine per week    Comment: per week  . Drug use: No  . Sexual activity: Not Currently    Comment: lives alone, retired from [employer], no dietary restrictions  Other Topics Concern  . Not on file  Social History Narrative  . Not on file   Social Determinants of Health   Financial Resource Strain:   . Difficulty of Paying Living Expenses: Not on file  Food Insecurity:   . Worried About Charity fundraiser in the Last Year: Not on file  . Ran Out of Food in the Last Year: Not on file  Transportation Needs:   . Lack of Transportation  (Medical): Not on file  . Lack of Transportation (Non-Medical): Not on file  Physical Activity:   . Days of Exercise per Week: Not on file  . Minutes of Exercise per Session: Not on file  Stress:   . Feeling of Stress : Not on file  Social Connections:   . Frequency of Communication with Friends and Family: Not on file  . Frequency of Social Gatherings with Friends and Family: Not on file  . Attends Religious Services: Not on file  . Active Member of Clubs or Organizations: Not on file  . Attends Archivist Meetings: Not on file  . Marital Status: Not on file  Intimate Partner Violence:   . Fear of Current or Ex-Partner: Not on file  . Emotionally Abused: Not on file  . Physically Abused: Not on file  . Sexually Abused: Not on file    Outpatient Medications Prior to Visit  Medication Sig Dispense Refill  . Ascorbic Acid (VITAMIN C) 1000 MG tablet Take 1,000 mg by mouth daily.    Marland Kitchen buPROPion (WELLBUTRIN) 75 MG tablet Take 0.5 tablets (37.5 mg total) by mouth daily. 45 tablet 2  . Calcium Carbonate-Vit D-Min (CALCIUM 1200 PO) Take 1 tablet by mouth 2 (two) times daily.    . cetirizine (ZYRTEC) 10 MG tablet Take 10 mg by mouth daily as needed for allergies.     . clonazePAM (KLONOPIN) 1 MG tablet Take 1 tablet (1 mg total) by mouth at bedtime. 90 tablet 1  . Cyanocobalamin (VITAMIN B 12 PO) Take 1,000 mg by mouth daily.     Marland Kitchen ECHINACEA PO Take 1,200 mg by mouth 2 (two) times daily.    Marland Kitchen HYDROmorphone (DILAUDID) 2 MG tablet Take 1-2 tablets (2-4 mg total) by mouth every 4 (four) hours as needed for moderate pain or severe pain (2 mg for moderate pain and 4 mg for severe pain.). 50 tablet 0  . mometasone (ELOCON) 0.1 % ointment Apply topically daily. As needed 15 g 0  . Omega-3 Fatty Acids (SUPER OMEGA-3 PO) Take 1,200 mg by mouth 2 (two) times daily.    . ondansetron (ZOFRAN ODT) 4 MG disintegrating tablet Take 1 tablet (4 mg total) by mouth every 8 (eight) hours as needed. 20  tablet 0  . OVER THE COUNTER MEDICATION Apply 1 application topically as needed (pain). Hemp lotion    . OVER THE COUNTER MEDICATION Take 500 mg by mouth daily as needed (pain). CBD oil    . Probiotic Product (PROBIOTIC DAILY PO) Take 1 tablet by mouth daily. NOW 10    . TURMERIC PO Take 894 mg by mouth 2 (two) times daily. GAIA brand-Take 1 in the morning  and 1 at night.    . gabapentin (NEURONTIN) 300 MG capsule Take 1 capsule (300 mg total) by mouth 3 (three) times daily for 21 days. 63 capsule 0  . polyvinyl alcohol (LIQUIFILM TEARS) 1.4 % ophthalmic solution Place 1 drop into both eyes as needed for dry eyes.     No facility-administered medications prior to visit.    Allergies  Allergen Reactions  . Aspirin Anaphylaxis and Swelling    Swelling of lips and fingers   . Celebrex [Celecoxib] Anaphylaxis, Hives and Shortness Of Breath  . Doxycycline Other (See Comments)    Scarred throat   . Prednisone Shortness Of Breath and Other (See Comments)    Chest tightness   . Fosamax [Alendronate Sodium] Hives  . Other Hives and Nausea Only    Arthritec  . Amitriptyline     UNSPECIFIED REACTION   . Chlorhexidine     UNSPECIFIED REACTION   . Celexa [Citalopram] Other (See Comments)    Shakiness   . Clindamycin/Lincomycin Nausea And Vomiting  . Tape Itching and Other (See Comments)    UNSPECIFIED REACTION  Paper tape is okay to use per patient    Review of Systems  Constitutional: Negative for fever and malaise/fatigue.  HENT: Negative for congestion.   Eyes: Negative for blurred vision.  Respiratory: Negative for shortness of breath.   Cardiovascular: Negative for chest pain, palpitations and leg swelling.  Gastrointestinal: Negative for abdominal pain, blood in stool and nausea.  Genitourinary: Negative for dysuria and frequency.  Musculoskeletal: Positive for joint pain and myalgias. Negative for falls.  Skin: Negative for rash.  Neurological: Positive for focal weakness.  Negative for dizziness, loss of consciousness and headaches.  Endo/Heme/Allergies: Negative for environmental allergies.  Psychiatric/Behavioral: Negative for depression. The patient is not nervous/anxious.        Objective:    Physical Exam Constitutional:      Appearance: Normal appearance. She is not ill-appearing.  HENT:     Head: Normocephalic and atraumatic.     Nose: Nose normal.  Eyes:     General:        Right eye: No discharge.        Left eye: No discharge.  Pulmonary:     Effort: Pulmonary effort is normal.  Neurological:     Mental Status: She is alert and oriented to person, place, and time.  Psychiatric:        Mood and Affect: Mood normal.        Behavior: Behavior normal.     BP 109/89 (BP Location: Left Arm, Patient Position: Sitting, Cuff Size: Normal)   Wt 115 lb (52.2 kg)   SpO2 94%   BMI 22.65 kg/m  Wt Readings from Last 3 Encounters:  11/03/19 115 lb (52.2 kg)  08/08/19 114 lb (51.7 kg)  03/15/19 125 lb (56.7 kg)    Diabetic Foot Exam - Simple   No data filed     Lab Results  Component Value Date   WBC 4.5 07/27/2019   HGB 12.0 07/27/2019   HCT 36.8 07/27/2019   PLT 189.0 07/27/2019   GLUCOSE 83 07/27/2019   CHOL 177 07/27/2019   TRIG 108.0 07/27/2019   HDL 68.20 07/27/2019   LDLCALC 87 07/27/2019   ALT 10 07/27/2019   AST 13 07/27/2019   NA 139 07/27/2019   K 4.1 07/27/2019   CL 102 07/27/2019   CREATININE 0.84 07/27/2019   BUN 22 07/27/2019   CO2 30 07/27/2019   TSH  2.41 07/27/2019    Lab Results  Component Value Date   TSH 2.41 07/27/2019   Lab Results  Component Value Date   WBC 4.5 07/27/2019   HGB 12.0 07/27/2019   HCT 36.8 07/27/2019   MCV 88.5 07/27/2019   PLT 189.0 07/27/2019   Lab Results  Component Value Date   NA 139 07/27/2019   K 4.1 07/27/2019   CO2 30 07/27/2019   GLUCOSE 83 07/27/2019   BUN 22 07/27/2019   CREATININE 0.84 07/27/2019   BILITOT 0.4 07/27/2019   ALKPHOS 62 07/27/2019   AST 13  07/27/2019   ALT 10 07/27/2019   PROT 6.5 07/27/2019   ALBUMIN 4.0 07/27/2019   CALCIUM 9.6 07/27/2019   ANIONGAP 14 03/10/2019   GFR 67.22 07/27/2019   Lab Results  Component Value Date   CHOL 177 07/27/2019   Lab Results  Component Value Date   HDL 68.20 07/27/2019   Lab Results  Component Value Date   LDLCALC 87 07/27/2019   Lab Results  Component Value Date   TRIG 108.0 07/27/2019   Lab Results  Component Value Date   CHOLHDL 3 07/27/2019   No results found for: HGBA1C     Assessment & Plan:   Problem List Items Addressed This Visit    Complex regional pain syndrome type I (Location of Primary Source of Pain) (upper extremity) (Right) (Chronic)    She is maintaining quarantine well except to keep up with her  Physical therapy so she does not back slide.       Insomnia    Encouraged good sleep hygiene such as dark, quiet room. No blue/green glowing lights such as computer screens in bedroom. No alcohol or stimulants in evening. Cut down on caffeine as able. Regular exercise is helpful but not just prior to bed time.       Hyperlipidemia    Encouraged heart healthy diet, increase exercise, avoid trans fats, consider a krill oil cap daily      Osteopenia    Encouraged to get adequate exercise, calcium and vitamin d intake      Multiple allergies    Cetirizine and Flonase prn      Vitamin D deficiency    Supplement and monitor         I am having Stephanie Barnes maintain her Probiotic Product (PROBIOTIC DAILY PO), Calcium Carbonate-Vit D-Min (CALCIUM 1200 PO), cetirizine, mometasone, Cyanocobalamin (VITAMIN B 12 PO), vitamin C, TURMERIC PO, Omega-3 Fatty Acids (SUPER OMEGA-3 PO), ECHINACEA PO, OVER THE COUNTER MEDICATION, OVER THE COUNTER MEDICATION, polyvinyl alcohol, HYDROmorphone, gabapentin, ondansetron, clonazePAM, and buPROPion.  No orders of the defined types were placed in this encounter.    I discussed the assessment and treatment plan  with the patient. The patient was provided an opportunity to ask questions and all were answered. The patient agreed with the plan and demonstrated an understanding of the instructions.   The patient was advised to call back or seek an in-person evaluation if the symptoms worsen or if the condition fails to improve as anticipated.  I provided 25 minutes of non-face-to-face time during this encounter.   Penni Homans, MD

## 2019-11-07 NOTE — Assessment & Plan Note (Signed)
Supplement and monitor 

## 2019-11-08 DIAGNOSIS — M25642 Stiffness of left hand, not elsewhere classified: Secondary | ICD-10-CM | POA: Diagnosis not present

## 2019-11-09 DIAGNOSIS — S52502D Unspecified fracture of the lower end of left radius, subsequent encounter for closed fracture with routine healing: Secondary | ICD-10-CM | POA: Diagnosis not present

## 2019-11-09 DIAGNOSIS — M79602 Pain in left arm: Secondary | ICD-10-CM | POA: Diagnosis not present

## 2019-11-09 DIAGNOSIS — M25612 Stiffness of left shoulder, not elsewhere classified: Secondary | ICD-10-CM | POA: Diagnosis not present

## 2019-11-10 DIAGNOSIS — M25642 Stiffness of left hand, not elsewhere classified: Secondary | ICD-10-CM | POA: Diagnosis not present

## 2019-11-15 DIAGNOSIS — M25642 Stiffness of left hand, not elsewhere classified: Secondary | ICD-10-CM | POA: Diagnosis not present

## 2019-11-16 DIAGNOSIS — M25612 Stiffness of left shoulder, not elsewhere classified: Secondary | ICD-10-CM | POA: Diagnosis not present

## 2019-11-17 DIAGNOSIS — M25642 Stiffness of left hand, not elsewhere classified: Secondary | ICD-10-CM | POA: Diagnosis not present

## 2019-11-22 DIAGNOSIS — M25642 Stiffness of left hand, not elsewhere classified: Secondary | ICD-10-CM | POA: Diagnosis not present

## 2019-11-23 DIAGNOSIS — M25612 Stiffness of left shoulder, not elsewhere classified: Secondary | ICD-10-CM | POA: Diagnosis not present

## 2019-11-24 DIAGNOSIS — M25642 Stiffness of left hand, not elsewhere classified: Secondary | ICD-10-CM | POA: Diagnosis not present

## 2019-11-27 ENCOUNTER — Encounter: Payer: Self-pay | Admitting: Family Medicine

## 2019-11-29 DIAGNOSIS — M25642 Stiffness of left hand, not elsewhere classified: Secondary | ICD-10-CM | POA: Diagnosis not present

## 2019-11-30 DIAGNOSIS — M25612 Stiffness of left shoulder, not elsewhere classified: Secondary | ICD-10-CM | POA: Diagnosis not present

## 2019-12-01 DIAGNOSIS — M25642 Stiffness of left hand, not elsewhere classified: Secondary | ICD-10-CM | POA: Diagnosis not present

## 2019-12-02 ENCOUNTER — Encounter: Payer: Self-pay | Admitting: Family Medicine

## 2019-12-08 ENCOUNTER — Ambulatory Visit: Payer: Medicare HMO

## 2019-12-17 ENCOUNTER — Ambulatory Visit: Payer: Medicare HMO | Attending: Internal Medicine

## 2019-12-17 DIAGNOSIS — Z23 Encounter for immunization: Secondary | ICD-10-CM | POA: Insufficient documentation

## 2019-12-17 NOTE — Progress Notes (Signed)
   Covid-19 Vaccination Clinic  Name:  Cia Sharf    MRN: [id number] DOB: July 09, 1951  12/17/2019  Ms. Seaward was observed post Covid-19 immunization for 15 minutes without incidence. She was provided with Vaccine Information Sheet and instruction to access the V-Safe system.   Ms. Heidel was instructed to call 911 with any severe reactions post vaccine: Marland Kitchen Difficulty breathing  . Swelling of your face and throat  . A fast heartbeat  . A bad rash all over your body  . Dizziness and weakness    Immunizations Administered    Name Date Dose VIS Date Route   Pfizer COVID-19 Vaccine 12/17/2019  8:48 AM 0.3 mL 10/21/2019 Intramuscular   Manufacturer: [place]   Lot: [id number]   [place]: [id number]

## 2020-01-11 ENCOUNTER — Ambulatory Visit: Payer: Medicare HMO | Attending: Internal Medicine

## 2020-01-11 DIAGNOSIS — Z23 Encounter for immunization: Secondary | ICD-10-CM | POA: Insufficient documentation

## 2020-01-11 NOTE — Progress Notes (Signed)
   Covid-19 Vaccination Clinic  Name:  Stephanie Barnes    MRN: [id number] DOB: [birth year]  01/11/2020  [name] was observed post Covid-19 immunization for 15 minutes without incident. She was provided with Vaccine Information Sheet and instruction to access the V-Safe system.   [name] was instructed to call 911 with any severe reactions post vaccine: Marland Kitchen Difficulty breathing  . Swelling of face and throat  . A fast heartbeat  . A bad rash all over body  . Dizziness and weakness   Immunizations Administered    Name Date Dose VIS Date Route   Pfizer COVID-19 Vaccine 01/11/2020 10:19 AM 0.3 mL 10/21/2019 Intramuscular   Manufacturer: [place]   Lot: [id number]   [place]: [id number]

## 2020-03-07 ENCOUNTER — Encounter: Payer: Self-pay | Admitting: Family Medicine

## 2020-03-12 ENCOUNTER — Other Ambulatory Visit: Payer: Self-pay

## 2020-03-12 ENCOUNTER — Ambulatory Visit: Payer: Medicare HMO | Admitting: Family Medicine

## 2020-03-12 VITALS — BP 106/60 | HR 78 | Temp 98.3°F | Resp 12 | Ht 59.0 in | Wt 119.6 lb

## 2020-03-12 DIAGNOSIS — K219 Gastro-esophageal reflux disease without esophagitis: Secondary | ICD-10-CM

## 2020-03-12 DIAGNOSIS — E782 Mixed hyperlipidemia: Secondary | ICD-10-CM

## 2020-03-12 DIAGNOSIS — E559 Vitamin D deficiency, unspecified: Secondary | ICD-10-CM

## 2020-03-12 DIAGNOSIS — G90513 Complex regional pain syndrome I of upper limb, bilateral: Secondary | ICD-10-CM

## 2020-03-12 DIAGNOSIS — D649 Anemia, unspecified: Secondary | ICD-10-CM | POA: Diagnosis not present

## 2020-03-12 DIAGNOSIS — T7840XD Allergy, unspecified, subsequent encounter: Secondary | ICD-10-CM

## 2020-03-12 LAB — LIPID PANEL
Cholesterol: 171 mg/dL (ref 0–200)
HDL: 68.3 mg/dL (ref >39.00)
LDL Cholesterol: 81 mg/dL (ref 0–99)
NonHDL: 102.96
Total CHOL/HDL Ratio: 3
Triglycerides: 111 mg/dL (ref 0.0–149.0)
VLDL: 22.2 mg/dL (ref 0.0–40.0)

## 2020-03-12 LAB — CBC
HCT: 36.9 % (ref 36.0–46.0)
Hemoglobin: 12.4 g/dL (ref 12.0–15.0)
MCHC: 33.7 g/dL (ref 30.0–36.0)
MCV: 90.8 fl (ref 78.0–100.0)
Platelets: 182 10*3/uL (ref 150.0–400.0)
RBC: 4.06 Mil/uL (ref 3.87–5.11)
RDW: 13.2 % (ref 11.5–15.5)
WBC: 4.4 10*3/uL (ref 4.0–10.5)

## 2020-03-12 LAB — COMPREHENSIVE METABOLIC PANEL
ALT: 12 U/L (ref 0–35)
AST: 15 U/L (ref 0–37)
Albumin: 4.1 g/dL (ref 3.5–5.2)
Alkaline Phosphatase: 59 U/L (ref 39–117)
BUN: 23 mg/dL (ref 6–23)
CO2: 29 mEq/L (ref 19–32)
Calcium: 9.5 mg/dL (ref 8.4–10.5)
Chloride: 103 mEq/L (ref 96–112)
Creatinine, Ser: 0.85 mg/dL (ref 0.40–1.20)
GFR: 66.19 mL/min (ref >60.00)
Glucose, Bld: 96 mg/dL (ref 70–99)
Potassium: 5.2 mEq/L — ABNORMAL HIGH (ref 3.5–5.1)
Sodium: 138 mEq/L (ref 135–145)
Total Bilirubin: 0.4 mg/dL (ref 0.2–1.2)
Total Protein: 6.5 g/dL (ref 6.0–8.3)

## 2020-03-12 LAB — TSH: TSH: 1.85 u[IU]/mL (ref 0.35–4.50)

## 2020-03-12 LAB — VITAMIN D 25 HYDROXY (VIT D DEFICIENCY, FRACTURES): VITD: 84.44 ng/mL (ref 30.00–100.00)

## 2020-03-12 MED ORDER — CLONAZEPAM 1 MG PO TABS: 1.0000 mg | ORAL_TABLET | Freq: Every day | ORAL | 2 refills | Status: DC

## 2020-03-12 MED ORDER — BUPROPION HCL 75 MG PO TABS: 37.5000 mg | ORAL_TABLET | Freq: Every day | ORAL | 2 refills | Status: DC

## 2020-03-12 MED ORDER — MOMETASONE FUROATE 0.1 % EX OINT: TOPICAL_OINTMENT | Freq: Every day | CUTANEOUS | 2 refills | Status: DC

## 2020-03-12 NOTE — Assessment & Plan Note (Signed)
Encouraged heart healthy diet, increase exercise, avoid trans fats, consider a krill oil cap daily 

## 2020-03-12 NOTE — Assessment & Plan Note (Signed)
Really bothering her eyes. Continue Zyrtec and Flonase and try adding Similisan or Systane eye drops if no improvement consider returning to opthamology for further consideration

## 2020-03-12 NOTE — Progress Notes (Signed)
Subjective:    Patient ID: Stephanie Barnes, female    DOB: 1951/04/23, 69 y.o.   MRN: [id number]  Chief Complaint  Patient presents with  . Follow-up    HPI Patient is in today for follow up on chronic medical concerns. Her CRPS has continued to be her greatest struggle. She has bilateral symptoms recently but has been able to golf some. Driving really makes both of her biceps hurt. No recent injury. She has had both of her covid shots. She is really struggling with allergies recently.nasal congestion and watery itchy eyes bother her daily and the over the counter eye drops she is using are no longer helpful. Flonase and Zyrtec do help some. Denies CP/palp/SOB/HA/fevers/GI or GU c/o. Taking meds as prescribed  Past Medical History:  Diagnosis Date  . Abnormal EKG 06/13/2013  . Anemia 01/13/2016  . Anxiety   . Arthritis   . Complex regional pain syndrome 01/01/2016  . Depression   . 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 Distal radius fracture, left   . 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 Medicare annual wellness visit, subsequent 10/02/2015  . Multiple allergies 03/31/2016  . Proximal humerus fracture    Left  . Rotator cuff tear   . 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 Welcome to Medicare preventive visit 10/02/2015    Past Surgical History:  Procedure Laterality Date  . DENTAL SURGERY     Root cleaning   . LASIK    . MOUTH SURGERY  08/10/2017   front lower  . OPEN REDUCTION INTERNAL FIXATION (ORIF) DISTAL RADIAL FRACTURE Left 03/15/2019   Procedure: OPEN REDUCTION INTERNAL FIXATION (ORIF) DISTAL RADIAL FRACTURE;  Surgeon: Roseanne Kaufman, MD;  Location: [facility];  Service: Orthopedics;  Laterality: Left;  . REFRACTIVE SURGERY Bilateral 2000  . REVERSE SHOULDER ARTHROPLASTY Left 03/15/2019   Procedure: REVERSE SHOULDER ARTHROPLASTY;  Surgeon: Nicholes Stairs, MD;  Location: [facility];  Service: Orthopedics;  Laterality: Left;  216min  . ROTATOR  CUFF REPAIR Right 2017  . TONSILLECTOMY      Family History  Problem Relation Age of Onset  . Heart disease Mother   . Arthritis Mother   . Heart disease Father   . COPD Father   . Mental illness Brother        bipolar  . Hypertension Brother   . Suicidality Brother   . Alcohol abuse Brother   . Heart disease Maternal Aunt   . Heart disease Maternal Uncle   . Heart disease Paternal Aunt   . COPD Paternal 64   . Heart disease Paternal Uncle   . Heart disease Maternal Grandmother   . Heart disease Maternal Grandfather   . Heart disease Paternal Grandmother   . Heart disease Paternal Grandfather     Social History   Socioeconomic History  . Marital status: Single    Spouse name: Not on file  . Number of children: Not on file  . Years of education: Not on file  . Highest education level: Not on file  Occupational History  . Not on file  Tobacco Use  . Smoking status: Never Smoker  . Smokeless tobacco: Never Used  Substance and Sexual Activity  . Alcohol use: Yes    Alcohol/week: 1.0 standard drinks    Types: 1 Glasses of wine per week    Comment: per week  . Drug use: No  . Sexual activity: Not Currently  Comment: lives alone, retired from [employer], no dietary restrictions  Other Topics Concern  . Not on file  Social History Narrative  . Not on file   Social Determinants of Health   Financial Resource Strain:   . Difficulty of Paying Living Expenses:   Food Insecurity:   . Worried About Charity fundraiser in the Last Year:   . Arboriculturist in the Last Year:   Transportation Needs:   . Film/video editor (Medical):   Marland Kitchen Lack of Transportation (Non-Medical):   Physical Activity:   . Days of Exercise per Week:   . Minutes of Exercise per Session:   Stress:   . Feeling of Stress :   Social Connections:   . Frequency of Communication with Friends and Family:   . Frequency of Social Gatherings with Friends and Family:   . Attends  Religious Services:   . Active Member of Clubs or Organizations:   . Attends Archivist Meetings:   Marland Kitchen Marital Status:   Intimate Partner Violence:   . Fear of Current or Ex-Partner:   . Emotionally Abused:   Marland Kitchen Physically Abused:   . Sexually Abused:     Outpatient Medications Prior to Visit  Medication Sig Dispense Refill  . amoxicillin (AMOXIL) 500 MG capsule Take 4 capsules by mouth. 1 hour prior to dental procedure    . Ascorbic Acid (VITAMIN C) 1000 MG tablet Take 1,000 mg by mouth daily.    . Calcium-Magnesium 500-250 MG TABS Take 1 tablet by mouth in the morning and at bedtime.    . cetirizine (ZYRTEC) 10 MG tablet Take 10 mg by mouth daily as needed for allergies.     . Cyanocobalamin (VITAMIN B 12 PO) Take 1,000 mg by mouth daily.     Marland Kitchen ECHINACEA PO Take 1,200 mg by mouth 2 (two) times daily.    Marland Kitchen HYDROmorphone (DILAUDID) 2 MG tablet Take 1-2 tablets (2-4 mg total) by mouth every 4 (four) hours as needed for moderate pain or severe pain (2 mg for moderate pain and 4 mg for severe pain.). 50 tablet 0  . Krill Oil 1000 MG CAPS Take 1 capsule by mouth daily.    . Melatonin-Pyridoxine (MELATONEX) 3-10 MG TBCR Take 1 tablet by mouth at bedtime.    . ondansetron (ZOFRAN ODT) 4 MG disintegrating tablet Take 1 tablet (4 mg total) by mouth every 8 (eight) hours as needed. 20 tablet 0  . OVER THE COUNTER MEDICATION Apply 1 application topically as needed (pain). Hemp lotion    . OVER THE COUNTER MEDICATION Take 500 mg by mouth daily as needed (pain). CBD oil    . Probiotic Product (PROBIOTIC DAILY PO) Take 1 tablet by mouth daily. NOW 10    . TURMERIC PO Take 894 mg by mouth 2 (two) times daily. GAIA brand-Take 1 in the morning and 1 at night.    Marland Kitchen UNABLE TO FIND Take 1 tablet by mouth daily. Med Name: Zinc 15mg  with Selenium 200mg     . buPROPion (WELLBUTRIN) 75 MG tablet Take 0.5 tablets (37.5 mg total) by mouth daily. 45 tablet 2  . clonazePAM (KLONOPIN) 1 MG tablet Take 1  tablet (1 mg total) by mouth at bedtime. 90 tablet 1  . mometasone (ELOCON) 0.1 % ointment Apply topically daily. As needed 15 g 0  . gabapentin (NEURONTIN) 300 MG capsule Take 1 capsule (300 mg total) by mouth 3 (three) times daily for 21 days. [place]  capsule 0  . Calcium Carbonate-Vit D-Min (CALCIUM 1200 PO) Take 1 tablet by mouth 2 (two) times daily.    . Omega-3 Fatty Acids (SUPER OMEGA-3 PO) Take 1,200 mg by mouth 2 (two) times daily.    . polyvinyl alcohol (LIQUIFILM TEARS) 1.4 % ophthalmic solution Place 1 drop into both eyes as needed for dry eyes.     No facility-administered medications prior to visit.    Allergies  Allergen Reactions  . Aspirin Anaphylaxis and Swelling    Swelling of lips and fingers   . Celebrex [Celecoxib] Anaphylaxis, Hives and Shortness Of Breath  . Doxycycline Other (See Comments)    Scarred throat   . Prednisone Shortness Of Breath and Other (See Comments)    Chest tightness   . Fosamax [Alendronate Sodium] Hives  . Other Hives and Nausea Only    Arthritec  . Amitriptyline     UNSPECIFIED REACTION   . Chlorhexidine     UNSPECIFIED REACTION   . Celexa [Citalopram] Other (See Comments)    Shakiness   . Clindamycin/Lincomycin Nausea And Vomiting  . Tape Itching and Other (See Comments)    UNSPECIFIED REACTION  Paper tape is okay to use per patient    Review of Systems  Constitutional: Negative for fever and malaise/fatigue.  HENT: Negative for congestion.   Eyes: Positive for discharge. Negative for blurred vision, double vision, photophobia, pain and redness.  Respiratory: Negative for shortness of breath.   Cardiovascular: Negative for chest pain, palpitations and leg swelling.  Gastrointestinal: Negative for abdominal pain, blood in stool and nausea.  Genitourinary: Negative for dysuria and frequency.  Musculoskeletal: Positive for back pain, joint pain, myalgias and neck pain. Negative for falls.  Skin: Negative for rash.  Neurological:  Positive for focal weakness and loss of consciousness. Negative for dizziness and headaches.  Endo/Heme/Allergies: Negative for environmental allergies.  Psychiatric/Behavioral: Negative for depression. The patient is not nervous/anxious.        Objective:    Physical Exam Vitals and nursing note reviewed.  Constitutional:      General: She is not in acute distress.    Appearance: She is well-developed.  HENT:     Head: Normocephalic and atraumatic.     Nose: Nose normal.  Eyes:     General:        Right eye: No discharge.        Left eye: No discharge.  Cardiovascular:     Rate and Rhythm: Normal rate and regular rhythm.     Heart sounds: No murmur.  Pulmonary:     Effort: Pulmonary effort is normal.     Breath sounds: Normal breath sounds.  Abdominal:     General: Bowel sounds are normal.     Palpations: Abdomen is soft.     Tenderness: There is no abdominal tenderness.  Musculoskeletal:        General: Deformity present.     Cervical back: Normal range of motion and neck supple.     Comments: Scar tissue noted on palpation left upper biceps region  Skin:    General: Skin is warm and dry.  Neurological:     Mental Status: She is alert and oriented to person, place, and time.     BP 106/60 (BP Location: Right Arm)   Pulse 78   Temp 98.3 F (36.8 C) (Temporal)   Resp 12   Ht 4\' 11"  (1.499 m)   Wt 119 lb 9.6 oz (54.3 kg)   SpO2 98%  BMI 24.16 kg/m  Wt Readings from Last 3 Encounters:  03/12/20 119 lb 9.6 oz (54.3 kg)  11/03/19 115 lb (52.2 kg)  08/08/19 114 lb (51.7 kg)    Diabetic Foot Exam - Simple   No data filed     Lab Results  Component Value Date   WBC 4.5 07/27/2019   HGB 12.0 07/27/2019   HCT 36.8 07/27/2019   PLT 189.0 07/27/2019   GLUCOSE 83 07/27/2019   CHOL 177 07/27/2019   TRIG 108.0 07/27/2019   HDL 68.20 07/27/2019   LDLCALC 87 07/27/2019   ALT 10 07/27/2019   AST 13 07/27/2019   NA 139 07/27/2019   K 4.1 07/27/2019   CL 102  07/27/2019   CREATININE 0.84 07/27/2019   BUN 22 07/27/2019   CO2 30 07/27/2019   TSH 2.41 07/27/2019    Lab Results  Component Value Date   TSH 2.41 07/27/2019   Lab Results  Component Value Date   WBC 4.5 07/27/2019   HGB 12.0 07/27/2019   HCT 36.8 07/27/2019   MCV 88.5 07/27/2019   PLT 189.0 07/27/2019   Lab Results  Component Value Date   NA 139 07/27/2019   K 4.1 07/27/2019   CO2 30 07/27/2019   GLUCOSE 83 07/27/2019   BUN 22 07/27/2019   CREATININE 0.84 07/27/2019   BILITOT 0.4 07/27/2019   ALKPHOS 62 07/27/2019   AST 13 07/27/2019   ALT 10 07/27/2019   PROT 6.5 07/27/2019   ALBUMIN 4.0 07/27/2019   CALCIUM 9.6 07/27/2019   ANIONGAP 14 03/10/2019   GFR 67.22 07/27/2019   Lab Results  Component Value Date   CHOL 177 07/27/2019   Lab Results  Component Value Date   HDL 68.20 07/27/2019   Lab Results  Component Value Date   LDLCALC 87 07/27/2019   Lab Results  Component Value Date   TRIG 108.0 07/27/2019   Lab Results  Component Value Date   CHOLHDL 3 07/27/2019   No results found for: HGBA1C     Assessment & Plan:   Problem List Items Addressed This Visit    GERD (gastroesophageal reflux disease)   Relevant Orders   TSH   Hyperlipidemia - Primary    Encouraged heart healthy diet, increase exercise, avoid trans fats, consider a krill oil cap daily      Relevant Orders   Comprehensive metabolic panel   Lipid panel   TSH   CRPS (complex regional pain syndrome), upper limb    Follows with [facility] and has an appointment with them this week. Both arms are giving her trouble but she has managed to start golfing again. She has pain in her biceps region with driving advised to try Lidocaine patches to see if that helps      Relevant Medications   clonazePAM (KLONOPIN) 1 MG tablet   buPROPion (WELLBUTRIN) 75 MG tablet   Anemia   Relevant Orders   CBC   Allergies    Really bothering her eyes. Continue Zyrtec and Flonase and try adding  Similisan or Systane eye drops if no improvement consider returning to opthamology for further consideration      Vitamin D deficiency    Supplement and monitor      Relevant Orders   VITAMIN D 25 Hydroxy (Vit-D Deficiency, Fractures)      I have discontinued Larry Sierras. Ramanathan's Calcium Carbonate-Vit D-Min (CALCIUM 1200 PO), Omega-3 Fatty Acids (SUPER OMEGA-3 PO), and polyvinyl alcohol. I am also having her maintain  her Probiotic Product (PROBIOTIC DAILY PO), cetirizine, Cyanocobalamin (VITAMIN B 12 PO), vitamin C, TURMERIC PO, ECHINACEA PO, OVER THE COUNTER MEDICATION, OVER THE COUNTER MEDICATION, HYDROmorphone, gabapentin, ondansetron, Krill Oil, Calcium-Magnesium, amoxicillin, Melatonex, UNABLE TO FIND, clonazePAM, buPROPion, and mometasone.  Meds ordered this encounter  Medications  . clonazePAM (KLONOPIN) 1 MG tablet    Sig: Take 1 tablet (1 mg total) by mouth at bedtime.    Dispense:  90 tablet    Refill:  2  . buPROPion (WELLBUTRIN) 75 MG tablet    Sig: Take 0.5 tablets (37.5 mg total) by mouth daily.    Dispense:  45 tablet    Refill:  2  . mometasone (ELOCON) 0.1 % ointment    Sig: Apply topically daily. As needed    Dispense:  15 g    Refill:  2     Penni Homans, MD

## 2020-03-12 NOTE — Assessment & Plan Note (Signed)
Supplement and monitor 

## 2020-03-12 NOTE — Assessment & Plan Note (Signed)
Follows with [facility] and has an appointment with them this week. Both arms are giving her trouble but she has managed to start golfing again. She has pain in her biceps region with driving advised to try Lidocaine patches to see if that helps

## 2020-03-12 NOTE — Patient Instructions (Signed)
Similisan allergy eye drop at [employer] website  Systane has good lubricating and allergies  Allergies, Adult An allergy is when your body's defense system (immune system) overreacts to an otherwise harmless substance (allergen) that you breathe in or eat or something that touches your skin. When you come into contact with something that you are allergic to, your immune system produces certain proteins (antibodies). These proteins cause cells to release chemicals (histamines) that trigger the symptoms of an allergic reaction. Allergies often affect the nasal passages (allergic rhinitis), eyes (allergic conjunctivitis), skin (atopic dermatitis), and stomach. Allergies can be mild or severe. Allergies cannot spread from person to person (are not contagious). They can develop at any age and may be outgrown. What increases the risk? You may be at greater risk of allergies if other people in your family have allergies. What are the signs or symptoms? Symptoms depend on what type of allergy you have. They may include:  Runny, stuffy nose.  Sneezing.  Itchy mouth, ears, or throat.  Postnasal drip.  Sore throat.  Itchy, red, watery, or puffy eyes.  Skin rash or hives.  Stomach pain.  Vomiting.  Diarrhea.  Bloating.  Wheezing or coughing. People with a severe allergy to food, medicine, or an insect bite may have a life-threatening allergic reaction (anaphylaxis). Symptoms of anaphylaxis include:  Hives.  Itching.  Flushed face.  Swollen lips, tongue, or mouth.  Tight or swollen throat.  Chest pain or tightness in the chest.  Trouble breathing or shortness of breath.  Rapid heartbeat.  Dizziness or fainting.  Vomiting.  Diarrhea.  Pain in the abdomen. How is this diagnosed? This condition is diagnosed based on:  Your symptoms.  Your family and medical history.  A physical exam. You may need to see a health care provider who specializes in treating allergies  (allergist). You may also have tests, including:  Skin tests to see which allergens are causing your symptoms, such as: ? Skin prick test. In this test, your skin is pricked with a tiny needle and exposed to small amounts of possible allergens to see if your skin reacts. ? Intradermal skin test. In this test, a small amount of allergen is injected under your skin to see if your skin reacts. ? Patch test. In this test, a small amount of allergen is placed on your skin and then your skin is covered with a bandage. Your health care provider will check your skin after a couple of days to see if a rash has developed.  Blood tests.  Challenges tests. In this test, you inhale a small amount of allergen by mouth to see if you have an allergic reaction. You may also be asked to:  Keep a food diary. A food diary is a record of all the foods and drinks you have in a day and any symptoms you experience.  Practice an elimination diet. An elimination diet involves eliminating specific foods from your diet and then adding them back in one by one to find out if a certain food causes an allergic reaction. How is this treated? Treatment for allergies depends on your symptoms. Treatment may include:  Cold compresses to soothe itching and swelling.  Eye drops.  Nasal sprays.  Using a saline spray or container (neti pot) to flush out the nose (nasal irrigation). These methods can help clear away mucus and keep the nasal passages moist.  Using a humidifier.  Oral antihistamines or other medicines to block allergic reaction and inflammation.  Skin  creams to treat rashes or itching.  Diet changes to eliminate food allergy triggers.  Repeated exposure to tiny amounts of allergens to build up a tolerance and prevent future allergic reactions (immunotherapy). These include: ? Allergy shots. ? Oral treatment. This involves taking small doses of an allergen under the tongue (sublingual  immunotherapy).  Emergency epinephrine injection (auto-injector) in case of an allergic emergency. This is a self-injectable, pre-measured medicine that must be given within the first few minutes of a serious allergic reaction. Follow these instructions at home:         Avoid known allergens whenever possible.  If you suffer from airborne allergens, wash out your nose daily. You can do this with a saline spray or a neti pot to flush out your nose (nasal irrigation).  Take over-the-counter and prescription medicines only as told by your health care provider.  Keep all follow-up visits as told by your health care provider. This is important.  If you are at risk of a severe allergic reaction (anaphylaxis), keep your auto-injector with you at all times.  If you have ever had anaphylaxis, wear a medical alert bracelet or necklace that states you have a severe allergy. Contact a health care provider if:  Your symptoms do not improve with treatment. Get help right away if:  You have symptoms of anaphylaxis, such as: ? Swollen mouth, tongue, or throat. ? Pain or tightness in your chest. ? Trouble breathing or shortness of breath. ? Dizziness or fainting. ? Severe abdominal pain, vomiting, or diarrhea. This information is not intended to replace advice given to you by your health care provider. Make sure you discuss any questions you have with your health care provider. Document Revised: 01/20/2018 Document Reviewed: 05/14/2016 Elsevier Patient Education  [place].

## 2020-03-13 ENCOUNTER — Other Ambulatory Visit: Payer: Self-pay | Admitting: *Deleted

## 2020-03-13 DIAGNOSIS — E875 Hyperkalemia: Secondary | ICD-10-CM

## 2020-04-10 ENCOUNTER — Encounter: Payer: Self-pay | Admitting: Family Medicine

## 2020-04-12 ENCOUNTER — Other Ambulatory Visit: Payer: Self-pay | Admitting: Family Medicine

## 2020-04-12 DIAGNOSIS — E875 Hyperkalemia: Secondary | ICD-10-CM

## 2020-04-13 ENCOUNTER — Other Ambulatory Visit: Payer: Self-pay

## 2020-04-13 ENCOUNTER — Other Ambulatory Visit (INDEPENDENT_AMBULATORY_CARE_PROVIDER_SITE_OTHER): Payer: Medicare HMO

## 2020-04-13 DIAGNOSIS — E875 Hyperkalemia: Secondary | ICD-10-CM

## 2020-04-13 LAB — COMPREHENSIVE METABOLIC PANEL
ALT: 19 U/L (ref 0–35)
AST: 18 U/L (ref 0–37)
Albumin: 4.3 g/dL (ref 3.5–5.2)
Alkaline Phosphatase: 58 U/L (ref 39–117)
BUN: 23 mg/dL (ref 6–23)
CO2: 29 mEq/L (ref 19–32)
Calcium: 9.1 mg/dL (ref 8.4–10.5)
Chloride: 103 mEq/L (ref 96–112)
Creatinine, Ser: 0.85 mg/dL (ref 0.40–1.20)
GFR: 66.17 mL/min (ref >60.00)
Glucose, Bld: 93 mg/dL (ref 70–99)
Potassium: 4.3 mEq/L (ref 3.5–5.1)
Sodium: 139 mEq/L (ref 135–145)
Total Bilirubin: 0.4 mg/dL (ref 0.2–1.2)
Total Protein: 6.6 g/dL (ref 6.0–8.3)

## 2020-08-06 ENCOUNTER — Encounter: Payer: Self-pay | Admitting: Family Medicine

## 2020-08-12 ENCOUNTER — Encounter: Payer: Self-pay | Admitting: Family Medicine

## 2020-08-13 ENCOUNTER — Telehealth (INDEPENDENT_AMBULATORY_CARE_PROVIDER_SITE_OTHER): Payer: Medicare HMO | Admitting: Medical

## 2020-08-13 ENCOUNTER — Other Ambulatory Visit: Payer: Self-pay

## 2020-08-13 VITALS — BP 115/60 | HR 88 | Temp 97.9°F

## 2020-08-13 DIAGNOSIS — Z20822 Contact with and (suspected) exposure to covid-19: Secondary | ICD-10-CM

## 2020-08-13 MED ORDER — AZITHROMYCIN 250 MG PO TABS
ORAL_TABLET | ORAL | 0 refills | Status: DC
Start: 2020-08-13 — End: 2020-09-20

## 2020-08-13 NOTE — Progress Notes (Signed)
Subjective:    Patient ID: Stephanie Barnes, female    DOB: [birth year], 69 y.o.   MRN: [id number]  HPI Virtual Visit via Video Note  I connected with Stephanie Barnes on 08/13/20 at  3:20 PM EDT by a video enabled telemedicine application and verified that I am speaking with the correct person using two identifiers.  Location: Patient: home Provider: visit. Participants- pt, myself and Jarrett Soho MA.  Auduio portion of video visit failed. Talked by phone then did exam in person. And finisheed interview in person.   I discussed the limitations of evaluation and management by telemedicine and the availability of in person appointments. The patient expressed understanding and agreed to proceed.  History of Present Illness: Pt in for sinus pain since Saturday. Pt states niece and great nephew had exposure to covid at school. Niece and great nephew tested negative to covid.  Pt has had both her vaccines and booster a week ago today.  Ear pain, st, some chest congestion and sinus pressure. No fever, no body ache, no change in smell or taste. No wheezing or sob.    Mild sneezing. No itchy eyes.  Hx of sinus infections once of twice year.  Pt has been taking zyrtec and mucinex.  Pt can take zpack.      Observations/Objective:(in person) General  Mental Status - Alert. General Appearance - Well groomed. Not in acute distress.  Skin Rashes- No Rashes.  HEENT Head- Normal.  Eye Sclera/Conjunctiva- Left- Normal. Right- Normal. Nose & Sinuses Nasal Mucosa- Left-  Boggy and Congested. Right-  Boggy and  Congested.Bilateral maxillary and frontal sinus pressure. M  Neck Neck- Supple. No Masses.   Chest and Lung Exam Auscultation: Breath Sounds:-Clear even and unlabored.  Cardiovascular Auscultation:Rythm- Regular, rate and rhythm. Murmurs & Other Heart Sounds:Ausculatation of the heart reveal- No Murmurs.  Lymphatic Head & Neck General Head & Neck  Lymphatics: Bilateral: Description- No Localized lymphadenopathy.   Assessment and Plan:   Follow Up Instructions:    I discussed the assessment and treatment plan with the patient. The patient was provided an opportunity to ask questions and all were answered. The patient agreed with the plan and demonstrated an understanding of the instructions.   The patient was advised to call back or seek an in-person evaluation if the symptoms worsen or if the condition fails to improve as anticipated.  I provided 30  minutes of non-face-to-face time during this encounter.   Mackie Pai, PA-C    Review of Systems  Constitutional: Positive for fatigue. Negative for chills and fever.  HENT: Positive for congestion, postnasal drip, sinus pressure and sinus pain.   Respiratory: Negative for cough and chest tightness.        States feels mild chest congested.  Cardiovascular: Negative for chest pain and palpitations.  Gastrointestinal: Negative for abdominal pain.  Musculoskeletal: Negative for back pain and myalgias.  Skin: Negative for rash.  Neurological: Negative for dizziness, syncope, weakness and headaches.  Hematological: Negative for adenopathy. Does not bruise/bleed easily.  Psychiatric/Behavioral: Negative for behavioral problems and confusion.   Past Medical History:  Diagnosis Date  . Abnormal EKG 06/13/2013  . Anemia 01/13/2016  . Anxiety   . Arthritis   . Complex regional pain syndrome 01/01/2016  . Depression   . 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 Distal radius fracture, left   . 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 Medicare annual wellness visit, subsequent  10/02/2015  . Multiple allergies 03/31/2016  . Proximal humerus fracture    Left  . Rotator cuff tear   . 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 Welcome to Medicare preventive visit 10/02/2015     Social History   Socioeconomic History  . Marital status: Single    Spouse  name: Not on file  . Number of children: Not on file  . Years of education: Not on file  . Highest education level: Not on file  Occupational History  . Not on file  Tobacco Use  . Smoking status: Never Smoker  . Smokeless tobacco: Never Used  Vaping Use  . Vaping Use: Never used  Substance and Sexual Activity  . Alcohol use: Yes    Alcohol/week: 1.0 standard drink    Types: 1 Glasses of wine per week    Comment: per week  . Drug use: No  . Sexual activity: Not Currently    Comment: lives alone, retired from [employer], no dietary restrictions  Other Topics Concern  . Not on file  Social History Narrative  . Not on file   Social Determinants of Health   Financial Resource Strain:   . Difficulty of Paying Living Expenses: Not on file  Food Insecurity:   . Worried About Charity fundraiser in the Last Year: Not on file  . Ran Out of Food in the Last Year: Not on file  Transportation Needs:   . Lack of Transportation (Medical): Not on file  . Lack of Transportation (Non-Medical): Not on file  Physical Activity:   . Days of Exercise per Week: Not on file  . Minutes of Exercise per Session: Not on file  Stress:   . Feeling of Stress : Not on file  Social Connections:   . Frequency of Communication with Friends and Family: Not on file  . Frequency of Social Gatherings with Friends and Family: Not on file  . Attends Religious Services: Not on file  . Active Member of Clubs or Organizations: Not on file  . Attends Archivist Meetings: Not on file  . Marital Status: Not on file  Intimate Partner Violence:   . Fear of Current or Ex-Partner: Not on file  . Emotionally Abused: Not on file  . Physically Abused: Not on file  . Sexually Abused: Not on file    Past Surgical History:  Procedure Laterality Date  . DENTAL SURGERY     Root cleaning   . LASIK    . MOUTH SURGERY  08/10/2017   front lower  . OPEN REDUCTION INTERNAL FIXATION (ORIF) DISTAL RADIAL  FRACTURE Left 03/15/2019   Procedure: OPEN REDUCTION INTERNAL FIXATION (ORIF) DISTAL RADIAL FRACTURE;  Surgeon: Roseanne Kaufman, MD;  Location: [facility];  Service: Orthopedics;  Laterality: Left;  . REFRACTIVE SURGERY Bilateral 2000  . REVERSE SHOULDER ARTHROPLASTY Left 03/15/2019   Procedure: REVERSE SHOULDER ARTHROPLASTY;  Surgeon: Nicholes Stairs, MD;  Location: [facility];  Service: Orthopedics;  Laterality: Left;  248min  . ROTATOR CUFF REPAIR Right 2017  . TONSILLECTOMY      Family History  Problem Relation Age of Onset  . Heart disease Mother   . Arthritis Mother   . Heart disease Father   . COPD Father   . Mental illness Brother        bipolar  . Hypertension Brother   . Suicidality Brother   . Alcohol abuse Brother   . Heart disease Maternal Aunt   . Heart disease  Maternal Uncle   . Heart disease Paternal Aunt   . COPD Paternal 85   . Heart disease Paternal Uncle   . Heart disease Maternal Grandmother   . Heart disease Maternal Grandfather   . Heart disease Paternal Grandmother   . Heart disease Paternal Grandfather     Allergies  Allergen Reactions  . Aspirin Anaphylaxis and Swelling    Swelling of lips and fingers   . Celebrex [Celecoxib] Anaphylaxis, Hives and Shortness Of Breath  . Doxycycline Other (See Comments)    Scarred throat   . Prednisone Shortness Of Breath and Other (See Comments)    Chest tightness   . Fosamax [Alendronate Sodium] Hives  . Other Hives and Nausea Only    Arthritec  . Amitriptyline     UNSPECIFIED REACTION   . Chlorhexidine     UNSPECIFIED REACTION   . Celexa [Citalopram] Other (See Comments)    Shakiness   . Clindamycin/Lincomycin Nausea And Vomiting  . Tape Itching and Other (See Comments)    UNSPECIFIED REACTION  Paper tape is okay to use per patient    Current Outpatient Medications on File Prior to Visit  Medication Sig Dispense Refill  . Ascorbic Acid (VITAMIN C) 1000 MG tablet Take 1,000 mg by mouth daily.    Marland Kitchen  buPROPion (WELLBUTRIN) 75 MG tablet Take 0.5 tablets (37.5 mg total) by mouth daily. 45 tablet 2  . Calcium-Magnesium 500-250 MG TABS Take 1 tablet by mouth in the morning and at bedtime.    . cetirizine (ZYRTEC) 10 MG tablet Take 10 mg by mouth daily as needed for allergies.     . clonazePAM (KLONOPIN) 1 MG tablet Take 1 tablet (1 mg total) by mouth at bedtime. 90 tablet 2  . Cyanocobalamin (VITAMIN B 12 PO) Take 1,000 mg by mouth daily.     Marland Kitchen ECHINACEA PO Take 1,200 mg by mouth 2 (two) times daily.    Marland Kitchen HYDROmorphone (DILAUDID) 2 MG tablet Take 1-2 tablets (2-4 mg total) by mouth every 4 (four) hours as needed for moderate pain or severe pain (2 mg for moderate pain and 4 mg for severe pain.). 50 tablet 0  . Krill Oil 1000 MG CAPS Take 1 capsule by mouth daily.    . Melatonin-Pyridoxine (MELATONEX) 3-10 MG TBCR Take 1 tablet by mouth at bedtime.    . mometasone (ELOCON) 0.1 % ointment Apply topically daily. As needed 15 g 2  . ondansetron (ZOFRAN ODT) 4 MG disintegrating tablet Take 1 tablet (4 mg total) by mouth every 8 (eight) hours as needed. 20 tablet 0  . OVER THE COUNTER MEDICATION Apply 1 application topically as needed (pain). Hemp lotion    . OVER THE COUNTER MEDICATION Take 500 mg by mouth daily as needed (pain). CBD oil    . Probiotic Product (PROBIOTIC DAILY PO) Take 1 tablet by mouth daily. NOW 10    . TURMERIC PO Take 894 mg by mouth 2 (two) times daily. GAIA brand-Take 1 in the morning and 1 at night.    Marland Kitchen UNABLE TO FIND Take 1 tablet by mouth daily. Med Name: Zinc 15mg  with Selenium 200mg     . amoxicillin (AMOXIL) 500 MG capsule Take 4 capsules by mouth. 1 hour prior to dental procedure (Patient not taking: Reported on 08/13/2020)    . gabapentin (NEURONTIN) 300 MG capsule Take 1 capsule (300 mg total) by mouth 3 (three) times daily for 21 days. 63 capsule 0   No current facility-administered medications on  file prior to visit.    BP 115/60   Pulse 88   Temp 97.9 F (36.6 C)  (Temporal)   SpO2 97%       Objective:   Physical Exam  General  Mental Status - Alert. General Appearance - Well groomed. Not in acute distress.  Skin Rashes- No Rashes.  HEENT  Nose & Sinuses Nasal Mucosa- Left-  Boggy and Congested. Right-  Boggy and  Congested.Bilateral maxillary and frontal sinus pressure. .  Neck Neck- Supple. No Masses.   Chest and Lung Exam Auscultation: Breath Sounds:-Clear even and unlabored.  Cardiovascular Auscultation:Rythm- Regular, rate and rhythm. Murmurs & Other Heart Sounds:Ausculatation of the heart reveal- No Murmurs.  Lymphatic Head & Neck General Head & Neck Lymphatics: Bilateral: Description- No Localized lymphadenopathy.      Assessment & Plan:  Sinusitis and mild bronchitis with history of allergies.  Continue mucinex d,  Zyrtec and restart flonase which you have at home.  Rx  Azithromycin.  Covid test done today. Results pending. Will update you when study is back and update [place] on response to treatment above as well.  Follow up in 7 days or as needed

## 2020-08-13 NOTE — Patient Instructions (Addendum)
More sinusitis type symptoms  and mild bronchitis with history of allergies.  Continue mucinex d,  Zyrtec and restart flonase which you have at home.  Rx  Azithromycin.  Covid test done today. Results pending. Will update you when study is back and update [place] on response to treatment above as well.  Follow up in 7 days or as needed

## 2020-08-15 LAB — NOVEL CORONAVIRUS, NAA: SARS-CoV-2, NAA: NOT DETECTED

## 2020-08-15 LAB — SARS-COV-2, NAA 2 DAY TAT

## 2020-08-15 NOTE — Telephone Encounter (Signed)
Patient contacted office because she was returning a call and wanted to let Dr. Charlett Blake know that she still has chest congestion, sore throat and her tongue is still burning.  She is using mucenex and zyrtec and has 2 days left of the Z-pack  Please advise

## 2020-08-15 NOTE — Telephone Encounter (Signed)
LMOM to return call.

## 2020-08-16 NOTE — Telephone Encounter (Signed)
Pt contacted informed of providers response to message . Pt states today she has begun to feel better. Encouraged to give medication some time and sx will subside. Encouraged to contact office next week for update or  for sx worsening.

## 2020-08-17 NOTE — Telephone Encounter (Signed)
Called patient and follow-up since tested negative for Covid.  Patient states feels much better, since has started taking antibiotics.

## 2020-08-17 NOTE — Telephone Encounter (Signed)
Called patient as a follow-up and stated is doing a lot better, since have tested  negative-Covid  and taking antibiotics.

## 2020-09-20 ENCOUNTER — Ambulatory Visit (INDEPENDENT_AMBULATORY_CARE_PROVIDER_SITE_OTHER): Payer: Medicare HMO | Admitting: Family Medicine

## 2020-09-20 ENCOUNTER — Encounter: Payer: Self-pay | Admitting: Family Medicine

## 2020-09-20 ENCOUNTER — Other Ambulatory Visit: Payer: Self-pay

## 2020-09-20 VITALS — BP 108/66 | HR 87 | Temp 98.3°F | Resp 16 | Ht 59.0 in | Wt 128.6 lb

## 2020-09-20 DIAGNOSIS — Z0000 Encounter for general adult medical examination without abnormal findings: Secondary | ICD-10-CM

## 2020-09-20 DIAGNOSIS — E782 Mixed hyperlipidemia: Secondary | ICD-10-CM

## 2020-09-20 DIAGNOSIS — D649 Anemia, unspecified: Secondary | ICD-10-CM | POA: Diagnosis not present

## 2020-09-20 DIAGNOSIS — M79601 Pain in right arm: Secondary | ICD-10-CM

## 2020-09-20 DIAGNOSIS — E2839 Other primary ovarian failure: Secondary | ICD-10-CM

## 2020-09-20 DIAGNOSIS — Z1231 Encounter for screening mammogram for malignant neoplasm of breast: Secondary | ICD-10-CM

## 2020-09-20 DIAGNOSIS — F418 Other specified anxiety disorders: Secondary | ICD-10-CM

## 2020-09-20 DIAGNOSIS — M8580 Other specified disorders of bone density and structure, unspecified site: Secondary | ICD-10-CM

## 2020-09-20 DIAGNOSIS — Z780 Asymptomatic menopausal state: Secondary | ICD-10-CM

## 2020-09-20 DIAGNOSIS — E559 Vitamin D deficiency, unspecified: Secondary | ICD-10-CM

## 2020-09-20 DIAGNOSIS — Z86010 Personal history of colonic polyps: Secondary | ICD-10-CM

## 2020-09-20 DIAGNOSIS — G8929 Other chronic pain: Secondary | ICD-10-CM

## 2020-09-20 LAB — CBC
HCT: 38.1 % (ref 36.0–46.0)
Hemoglobin: 12.7 g/dL (ref 12.0–15.0)
MCHC: 33.4 g/dL (ref 30.0–36.0)
MCV: 90.3 fl (ref 78.0–100.0)
Platelets: 196 10*3/uL (ref 150.0–400.0)
RBC: 4.23 Mil/uL (ref 3.87–5.11)
RDW: 13.2 % (ref 11.5–15.5)
WBC: 4.6 10*3/uL (ref 4.0–10.5)

## 2020-09-20 LAB — COMPREHENSIVE METABOLIC PANEL
ALT: 14 U/L (ref 0–35)
AST: 19 U/L (ref 0–37)
Albumin: 4.3 g/dL (ref 3.5–5.2)
Alkaline Phosphatase: 50 U/L (ref 39–117)
BUN: 21 mg/dL (ref 6–23)
CO2: 31 mEq/L (ref 19–32)
Calcium: 9.3 mg/dL (ref 8.4–10.5)
Chloride: 102 mEq/L (ref 96–112)
Creatinine, Ser: 0.87 mg/dL (ref 0.40–1.20)
GFR: 67.62 mL/min (ref >60.00)
Glucose, Bld: 89 mg/dL (ref 70–99)
Potassium: 5.3 mEq/L — ABNORMAL HIGH (ref 3.5–5.1)
Sodium: 138 mEq/L (ref 135–145)
Total Bilirubin: 0.4 mg/dL (ref 0.2–1.2)
Total Protein: 6.7 g/dL (ref 6.0–8.3)

## 2020-09-20 LAB — LIPID PANEL
Cholesterol: 182 mg/dL (ref 0–200)
HDL: 73.1 mg/dL (ref >39.00)
LDL Cholesterol: 85 mg/dL (ref 0–99)
NonHDL: 109.37
Total CHOL/HDL Ratio: 2
Triglycerides: 120 mg/dL (ref 0.0–149.0)
VLDL: 24 mg/dL (ref 0.0–40.0)

## 2020-09-20 LAB — VITAMIN D 25 HYDROXY (VIT D DEFICIENCY, FRACTURES): VITD: 53.79 ng/mL (ref 30.00–100.00)

## 2020-09-20 LAB — TSH: TSH: 2.18 u[IU]/mL (ref 0.35–4.50)

## 2020-09-20 MED ORDER — CLONAZEPAM 1 MG PO TABS
1.0000 mg | ORAL_TABLET | Freq: Every day | ORAL | 1 refills | Status: DC
Start: 2020-09-20 — End: 2021-03-13

## 2020-09-20 NOTE — Assessment & Plan Note (Signed)
She continues to struggle with daily pain and debility but keeps up her exercises and golfing and has manageable function. Is able to manage her ADLs.

## 2020-09-20 NOTE — Assessment & Plan Note (Signed)
Supplement and monitor 

## 2020-09-20 NOTE — Assessment & Plan Note (Signed)
Patient encouraged to maintain heart healthy diet, regular exercise, adequate sleep. Consider daily probiotics. Take medications as prescribed. Labs ordered and reviewed. Colonoscopy at [facility] in 2015, due by 2025. MGM July, 16, 2019. Pap 12/07/2018 will reconsider next pap January 2023. Dexa scan 04/07/2017 showed osteopenia

## 2020-09-20 NOTE — Assessment & Plan Note (Signed)
Encouraged to get adequate exercise, calcium and vitamin d intake. Repeat Dexa ordered

## 2020-09-20 NOTE — Patient Instructions (Addendum)
Consider tetanus shot at pharmacy if they will not cover it make sure t Preventive Care 65 Years and Older, Female Preventive care refers to lifestyle choices and visits with your health care provider that can promote health and wellness. This includes:  A yearly physical exam. This is also called an annual well check.  Regular dental and eye exams.  Immunizations.  Screening for certain conditions.  Healthy lifestyle choices, such as diet and exercise. What can I expect for my preventive care visit? Physical exam Your health care provider will check:  Height and weight. These may be used to calculate body mass index (BMI), which is a measurement that tells if you are at a healthy weight.  Heart rate and blood pressure.  Your skin for abnormal spots. Counseling Your health care provider may ask you questions about:  Alcohol, tobacco, and drug use.  Emotional well-being.  Home and relationship well-being.  Sexual activity.  Eating habits.  History of falls.  Memory and ability to understand (cognition).  Work and work Astronomer.  Pregnancy and menstrual history. What immunizations do I need?  Influenza (flu) vaccine  This is recommended every year. Tetanus, diphtheria, and pertussis (Tdap) vaccine  You may need a Td booster every 10 years. Varicella (chickenpox) vaccine  You may need this vaccine if you have not already been vaccinated. Zoster (shingles) vaccine  You may need this after age 80. Pneumococcal conjugate (PCV13) vaccine  One dose is recommended after age 70. Pneumococcal polysaccharide (PPSV23) vaccine  One dose is recommended after age 70. Measles, mumps, and rubella (MMR) vaccine  You may need at least one dose of MMR if you were born in 1957 or later. You may also need a second dose. Meningococcal conjugate (MenACWY) vaccine  You may need this if you have certain conditions. Hepatitis A vaccine  You may need this if you have  certain conditions or if you travel or work in places where you may be exposed to hepatitis A. Hepatitis B vaccine  You may need this if you have certain conditions or if you travel or work in places where you may be exposed to hepatitis B. Haemophilus influenzae type b (Hib) vaccine  You may need this if you have certain conditions. You may receive vaccines as individual doses or as more than one vaccine together in one shot (combination vaccines). Talk with your health care provider about the risks and benefits of combination vaccines. What tests do I need? Blood tests  Lipid and cholesterol levels. These may be checked every 5 years, or more frequently depending on your overall health.  Hepatitis C test.  Hepatitis B test. Screening  Lung cancer screening. You may have this screening every year starting at age 70 if you have a 30-pack-year history of smoking and currently smoke or have quit within the past 15 years.  Colorectal cancer screening. All adults should have this screening starting at age 70 and continuing until age 70. Your health care provider may recommend screening at age 4 if you are at increased risk. You will have tests every 1-10 years, depending on your results and the type of screening test.  Diabetes screening. This is done by checking your blood sugar (glucose) after you have not eaten for a while (fasting). You may have this done every 1-3 years.  Mammogram. This may be done every 1-2 years. Talk with your health care provider about how often you should have regular mammograms.  BRCA-related cancer screening. This may be  done if you have a family history of breast, ovarian, tubal, or peritoneal cancers. Other tests  Sexually transmitted disease (STD) testing.  Bone density scan. This is done to screen for osteoporosis. You may have this done starting at age 70. Follow these instructions at home: Eating and drinking  Eat a diet that includes fresh fruits  and vegetables, whole grains, lean protein, and low-fat dairy products. Limit your intake of foods with high amounts of sugar, saturated fats, and salt.  Take vitamin and mineral supplements as recommended by your health care provider.  Do not drink alcohol if your health care provider tells you not to drink.  If you drink alcohol: ? Limit how much you have to 0-1 drink a day. ? Be aware of how much alcohol is in your drink. In the [place] one drink equals one 12 oz bottle of beer (355 mL), one 5 oz glass of wine (148 mL), or one 1 oz glass of hard liquor (44 mL). Lifestyle  Take daily care of your teeth and gums.  Stay active. Exercise for at least 30 minutes on 5 or more days each week.  Do not use any products that contain nicotine or tobacco, such as cigarettes, e-cigarettes, and chewing tobacco. If you need help quitting, ask your health care provider.  If you are sexually active, practice safe sex. Use a condom or other form of protection in order to prevent STIs (sexually transmitted infections).  Talk with your health care provider about taking a low-dose aspirin or statin. What's next?  Go to your health care provider once a year for a well check visit.  Ask your health care provider how often you should have your eyes and teeth checked.  Stay up to date on all vaccines. This information is not intended to replace advice given to you by your health care provider. Make sure you discuss any questions you have with your health care provider. Document Revised: 10/21/2018 Document Reviewed: 10/21/2018 Elsevier Patient Education  [employer]. o get it if you ever get injured
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
## 2020-09-20 NOTE — Assessment & Plan Note (Signed)
Encouraged heart healthy diet, increase exercise, avoid trans fats, consider a krill oil cap daily 
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
## 2020-09-21 ENCOUNTER — Telehealth: Payer: Self-pay

## 2020-09-21 ENCOUNTER — Encounter: Payer: Self-pay | Admitting: Family Medicine

## 2020-09-21 NOTE — Telephone Encounter (Signed)
Pt is aware of lab results.

## 2020-10-22 ENCOUNTER — Other Ambulatory Visit: Payer: Medicare HMO

## 2020-10-25 ENCOUNTER — Other Ambulatory Visit (INDEPENDENT_AMBULATORY_CARE_PROVIDER_SITE_OTHER): Payer: Medicare HMO

## 2020-10-25 ENCOUNTER — Other Ambulatory Visit: Payer: Self-pay

## 2020-10-25 DIAGNOSIS — E875 Hyperkalemia: Secondary | ICD-10-CM | POA: Diagnosis not present

## 2020-10-25 LAB — COMPREHENSIVE METABOLIC PANEL
ALT: 13 U/L (ref 0–35)
AST: 16 U/L (ref 0–37)
Albumin: 4.4 g/dL (ref 3.5–5.2)
Alkaline Phosphatase: 46 U/L (ref 39–117)
BUN: 22 mg/dL (ref 6–23)
CO2: 31 mEq/L (ref 19–32)
Calcium: 9.7 mg/dL (ref 8.4–10.5)
Chloride: 103 mEq/L (ref 96–112)
Creatinine, Ser: 1.01 mg/dL (ref 0.40–1.20)
GFR: 56.5 mL/min — ABNORMAL LOW (ref >60.00)
Glucose, Bld: 79 mg/dL (ref 70–99)
Potassium: 4.7 mEq/L (ref 3.5–5.1)
Sodium: 140 mEq/L (ref 135–145)
Total Bilirubin: 0.5 mg/dL (ref 0.2–1.2)
Total Protein: 7 g/dL (ref 6.0–8.3)

## 2020-11-19 DIAGNOSIS — M9902 Segmental and somatic dysfunction of thoracic region: Secondary | ICD-10-CM | POA: Diagnosis not present

## 2020-11-19 DIAGNOSIS — M9903 Segmental and somatic dysfunction of lumbar region: Secondary | ICD-10-CM | POA: Diagnosis not present

## 2020-11-19 DIAGNOSIS — M79602 Pain in left arm: Secondary | ICD-10-CM | POA: Diagnosis not present

## 2020-11-19 DIAGNOSIS — M5417 Radiculopathy, lumbosacral region: Secondary | ICD-10-CM | POA: Diagnosis not present

## 2020-11-19 DIAGNOSIS — M62830 Muscle spasm of back: Secondary | ICD-10-CM | POA: Diagnosis not present

## 2020-11-19 DIAGNOSIS — M5413 Radiculopathy, cervicothoracic region: Secondary | ICD-10-CM | POA: Diagnosis not present

## 2020-11-19 DIAGNOSIS — M9901 Segmental and somatic dysfunction of cervical region: Secondary | ICD-10-CM | POA: Diagnosis not present

## 2020-11-30 ENCOUNTER — Encounter: Payer: Self-pay | Admitting: Family Medicine

## 2020-11-30 DIAGNOSIS — E119 Type 2 diabetes mellitus without complications: Secondary | ICD-10-CM

## 2020-12-02 ENCOUNTER — Encounter: Payer: Self-pay | Admitting: Family Medicine

## 2020-12-02 DIAGNOSIS — Z1159 Encounter for screening for other viral diseases: Secondary | ICD-10-CM | POA: Diagnosis not present

## 2020-12-17 ENCOUNTER — Encounter: Payer: Self-pay | Admitting: Family Medicine

## 2020-12-17 ENCOUNTER — Telehealth (INDEPENDENT_AMBULATORY_CARE_PROVIDER_SITE_OTHER): Payer: Medicare HMO | Admitting: Family Medicine

## 2020-12-17 DIAGNOSIS — E559 Vitamin D deficiency, unspecified: Secondary | ICD-10-CM | POA: Diagnosis not present

## 2020-12-17 DIAGNOSIS — J329 Chronic sinusitis, unspecified: Secondary | ICD-10-CM | POA: Diagnosis not present

## 2020-12-17 DIAGNOSIS — F418 Other specified anxiety disorders: Secondary | ICD-10-CM

## 2020-12-17 MED ORDER — AZITHROMYCIN 250 MG PO TABS: ORAL_TABLET | ORAL | 0 refills | Status: DC

## 2020-12-17 MED ORDER — BUPROPION HCL 75 MG PO TABS: 37.5000 mg | ORAL_TABLET | Freq: Every day | ORAL | 2 refills | Status: DC

## 2020-12-17 NOTE — Assessment & Plan Note (Addendum)
Has had a sore throat and then headache with sinus pressure and congestion for about 5 days. She agrees to take a PCR COVID test and will send in Azithromycin for a possible sinus infection. Continue Zyrtec, Flonase and Mucinex and add some nasal saline.

## 2020-12-17 NOTE — Assessment & Plan Note (Signed)
Supplement and monitor 
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
## 2020-12-17 NOTE — Progress Notes (Signed)
Virtual Visit via Video Note  I connected with Stephanie Barnes on 12/17/20 at  2:20 PM EST by a video enabled telemedicine application and verified that I am speaking with the correct person using two identifiers.  Location: Patient: home, patient and provider in visit Provider: office   I discussed the limitations of evaluation and management by telemedicine and the availability of in person appointments. The patient expressed understanding and agreed to proceed. S Chism, CMA was able to get the patient set up on a video visit  Subjective:    Patient ID: Stephanie Barnes, female    DOB: 1951-03-19, 70 y.o.   MRN: [id number]  Chief Complaint  Patient presents with  . Sore Throat    Pt states that this started about Thursday night.    HPI Patient is in today for follow up on chronic medical concerns. No recent febrile illness or hospitalizations. She notes her stress is improved some and her niece and nephew are settling in better and that has helped her stress. She has noted increased nasal congestion, headache, fatigue and she feels she is coming down with another sinus infection. She has been tested for covid a few times in the past month but not in the last week. Denies CP/palp/SOB/fevers/GI or GU c/o. Taking meds as prescribed  Past Medical History:  Diagnosis Date  . Abnormal EKG 06/13/2013  . Anemia 01/13/2016  . Anxiety   . Arthritis   . Complex regional pain syndrome 01/01/2016  . Depression   . 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 Distal radius fracture, left   . 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 Medicare annual wellness visit, subsequent 10/02/2015  . Multiple allergies 03/31/2016  . Proximal humerus fracture    Left  . Rotator cuff tear   . 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 Welcome to Medicare preventive visit 10/02/2015    Past Surgical History:  Procedure Laterality Date  . DENTAL SURGERY     Root cleaning   . LASIK     . MOUTH SURGERY  08/10/2017   front lower  . OPEN REDUCTION INTERNAL FIXATION (ORIF) DISTAL RADIAL FRACTURE Left 03/15/2019   Procedure: OPEN REDUCTION INTERNAL FIXATION (ORIF) DISTAL RADIAL FRACTURE;  Surgeon: Roseanne Kaufman, MD;  Location: [place];  Service: Orthopedics;  Laterality: Left;  . REFRACTIVE SURGERY Bilateral 2000  . REVERSE SHOULDER ARTHROPLASTY Left 03/15/2019   Procedure: REVERSE SHOULDER ARTHROPLASTY;  Surgeon: Nicholes Stairs, MD;  Location: [place];  Service: Orthopedics;  Laterality: Left;  282min  . ROTATOR CUFF REPAIR Right 2017  . TONSILLECTOMY      Family History  Problem Relation Age of Onset  . Heart disease Mother   . Arthritis Mother   . Heart disease Father   . COPD Father   . Mental illness Brother        bipolar  . Hypertension Brother   . Suicidality Brother   . Alcohol abuse Brother   . Heart disease Maternal Aunt   . Heart disease Maternal Uncle   . Heart disease Paternal Aunt   . COPD Paternal 5   . Heart disease Paternal Uncle   . Heart disease Maternal Grandmother   . Heart disease Maternal Grandfather   . Heart disease Paternal Grandmother   . Heart disease Paternal Grandfather     Social History   Socioeconomic History  . Marital status: Single    Spouse name: Not on  file  . Number of children: Not on file  . Years of education: Not on file  . Highest education level: Not on file  Occupational History  . Not on file  Tobacco Use  . Smoking status: Never Smoker  . Smokeless tobacco: Never Used  Vaping Use  . Vaping Use: Never used  Substance and Sexual Activity  . Alcohol use: Yes    Alcohol/week: 1.0 standard drink    Types: 1 Glasses of wine per week    Comment: per week  . Drug use: No  . Sexual activity: Not Currently    Comment: lives alone, retired from [employer], no dietary restrictions  Other Topics Concern  . Not on file  Social History Narrative  . Not on file   Social Determinants of Health    Financial Resource Strain: Not on file  Food Insecurity: Not on file  Transportation Needs: Not on file  Physical Activity: Not on file  Stress: Not on file  Social Connections: Not on file  Intimate Partner Violence: Not on file    Outpatient Medications Prior to Visit  Medication Sig Dispense Refill  . amoxicillin (AMOXIL) 500 MG capsule Take 4 capsules by mouth. 1 hour prior to dental procedure     . Ascorbic Acid (VITAMIN C) 1000 MG tablet Take 1,000 mg by mouth daily.    . Calcium-Magnesium 500-250 MG TABS Take 1 tablet by mouth in the morning and at bedtime.    . cetirizine (ZYRTEC) 10 MG tablet Take 10 mg by mouth daily as needed for allergies.     . clonazePAM (KLONOPIN) 1 MG tablet Take 1 tablet (1 mg total) by mouth at bedtime. 90 tablet 1  . Cyanocobalamin (VITAMIN B 12 PO) Take 1,000 mg by mouth daily.     Marland Kitchen ECHINACEA PO Take 1,200 mg by mouth 2 (two) times daily.    Marland Kitchen HYDROmorphone (DILAUDID) 2 MG tablet Take 1-2 tablets (2-4 mg total) by mouth every 4 (four) hours as needed for moderate pain or severe pain (2 mg for moderate pain and 4 mg for severe pain.). 50 tablet 0  . Krill Oil 1000 MG CAPS Take 1 capsule by mouth daily.    . Melatonin-Pyridoxine (MELATONEX) 3-10 MG TBCR Take 1 tablet by mouth at bedtime.    . mometasone (ELOCON) 0.1 % ointment Apply topically daily. As needed 15 g 2  . OVER THE COUNTER MEDICATION Apply 1 application topically as needed (pain). Hemp lotion    . Probiotic Product (PROBIOTIC DAILY PO) Take 1 tablet by mouth daily. NOW 10    . TURMERIC PO Take 894 mg by mouth 2 (two) times daily. GAIA brand-Take 1 in the morning and 1 at night.    Marland Kitchen UNABLE TO FIND Take 1 tablet by mouth daily. Med Name: Zinc 15mg  with Selenium 200mg     . buPROPion (WELLBUTRIN) 75 MG tablet Take 0.5 tablets (37.5 mg total) by mouth daily. 45 tablet 2  . gabapentin (NEURONTIN) 300 MG capsule Take 1 capsule (300 mg total) by mouth 3 (three) times daily for 21 days. 63  capsule 0  . OVER THE COUNTER MEDICATION Take 500 mg by mouth daily as needed (pain). CBD oil     No facility-administered medications prior to visit.    Allergies  Allergen Reactions  . Aspirin Anaphylaxis and Swelling    Swelling of lips and fingers   . Celebrex [Celecoxib] Anaphylaxis, Hives and Shortness Of Breath  . Doxycycline Other (See Comments)  Scarred throat   . Prednisone Shortness Of Breath and Other (See Comments)    Chest tightness   . Fosamax [Alendronate Sodium] Hives  . Other Hives and Nausea Only    Arthritec  . Amitriptyline     UNSPECIFIED REACTION   . Chlorhexidine     UNSPECIFIED REACTION   . Celexa [Citalopram] Other (See Comments)    Shakiness   . Clindamycin/Lincomycin Nausea And Vomiting  . Tape Itching and Other (See Comments)    UNSPECIFIED REACTION  Paper tape is okay to use per patient    Review of Systems  Constitutional: Positive for malaise/fatigue. Negative for fever.  HENT: Positive for congestion and sinus pain.   Eyes: Negative for blurred vision.  Respiratory: Negative for shortness of breath.   Cardiovascular: Negative for chest pain, palpitations and leg swelling.  Gastrointestinal: Negative for abdominal pain, blood in stool and nausea.  Genitourinary: Negative for dysuria and frequency.  Musculoskeletal: Positive for joint pain and myalgias. Negative for falls.  Skin: Negative for rash.  Neurological: Positive for headaches. Negative for dizziness and loss of consciousness.  Endo/Heme/Allergies: Negative for environmental allergies.  Psychiatric/Behavioral: Negative for depression. The patient is nervous/anxious.        Objective:    Physical Exam Constitutional:      Appearance: She is well-developed. She is not ill-appearing.  HENT:     Head: Normocephalic and atraumatic.  Eyes:     Conjunctiva/sclera: Conjunctivae normal.  Pulmonary:     Effort: Pulmonary effort is normal.  Neurological:     Mental Status: She  is alert and oriented to person, place, and time.  Psychiatric:        Behavior: Behavior normal.     BP 138/87   Pulse 84   Temp 98.4 F (36.9 C)   SpO2 98%  Wt Readings from Last 3 Encounters:  09/20/20 128 lb 9.6 oz (58.3 kg)  03/12/20 119 lb 9.6 oz (54.3 kg)  11/03/19 115 lb (52.2 kg)    Diabetic Foot Exam - Simple   No data filed    Lab Results  Component Value Date   WBC 4.6 09/20/2020   HGB 12.7 09/20/2020   HCT 38.1 09/20/2020   PLT 196.0 09/20/2020   GLUCOSE 79 10/25/2020   CHOL 182 09/20/2020   TRIG 120.0 09/20/2020   HDL 73.10 09/20/2020   LDLCALC 85 09/20/2020   ALT 13 10/25/2020   AST 16 10/25/2020   NA 140 10/25/2020   K 4.7 10/25/2020   CL 103 10/25/2020   CREATININE 1.01 10/25/2020   BUN 22 10/25/2020   CO2 31 10/25/2020   TSH 2.18 09/20/2020    Lab Results  Component Value Date   TSH 2.18 09/20/2020   Lab Results  Component Value Date   WBC 4.6 09/20/2020   HGB 12.7 09/20/2020   HCT 38.1 09/20/2020   MCV 90.3 09/20/2020   PLT 196.0 09/20/2020   Lab Results  Component Value Date   NA 140 10/25/2020   K 4.7 10/25/2020   CO2 31 10/25/2020   GLUCOSE 79 10/25/2020   BUN 22 10/25/2020   CREATININE 1.01 10/25/2020   BILITOT 0.5 10/25/2020   ALKPHOS 46 10/25/2020   AST 16 10/25/2020   ALT 13 10/25/2020   PROT 7.0 10/25/2020   ALBUMIN 4.4 10/25/2020   CALCIUM 9.7 10/25/2020   ANIONGAP 14 03/10/2019   GFR 56.50 (L) 10/25/2020   Lab Results  Component Value Date   CHOL 182 09/20/2020  Lab Results  Component Value Date   HDL 73.10 09/20/2020   Lab Results  Component Value Date   LDLCALC 85 09/20/2020   Lab Results  Component Value Date   TRIG 120.0 09/20/2020   Lab Results  Component Value Date   CHOLHDL 2 09/20/2020   No results found for: HGBA1C     Assessment & Plan:   Problem List Items Addressed This Visit    Depression with anxiety    Refill given on the Wellbutrin 75 mg tabs, 1/2 tab po daily. Doing  better as her niece ad nephew have moved in with her. As their mother gets her mental health together they will move in with her again      Relevant Medications   buPROPion (WELLBUTRIN) 75 MG tablet   Vitamin D deficiency    Supplement and monitor      Sinusitis    Has had a sore throat and then headache with sinus pressure and congestion for about 5 days. She agrees to take a PCR COVID test and will send in Azithromycin for a possible sinus infection. Continue Zyrtec, Flonase and Mucinex and add some nasal saline.       Relevant Medications   azithromycin (ZITHROMAX) 250 MG tablet      I am having Stephanie Barnes start on azithromycin. I am also having her maintain her Probiotic Product (PROBIOTIC DAILY PO), cetirizine, Cyanocobalamin (VITAMIN B 12 PO), vitamin C, TURMERIC PO, ECHINACEA PO, OVER THE COUNTER MEDICATION, OVER THE COUNTER MEDICATION, HYDROmorphone, gabapentin, Krill Oil, Calcium-Magnesium, amoxicillin, Melatonex, UNABLE TO FIND, mometasone, clonazePAM, and buPROPion.  Meds ordered this encounter  Medications  . azithromycin (ZITHROMAX) 250 MG tablet    Sig: 2 tabs po once then 1 tab po daily    Dispense:  6 tablet    Refill:  0  . buPROPion (WELLBUTRIN) 75 MG tablet    Sig: Take 0.5 tablets (37.5 mg total) by mouth daily.    Dispense:  45 tablet    Refill:  2   I discussed the assessment and treatment plan with the patient. The patient was provided an opportunity to ask questions and all were answered. The patient agreed with the plan and demonstrated an understanding of the instructions.   The patient was advised to call back or seek an in-person evaluation if the symptoms worsen or if the condition fails to improve as anticipated.  I provided 20 minutes of non-face-to-face time during this encounter.   Penni Homans, MD

## 2020-12-18 ENCOUNTER — Other Ambulatory Visit: Payer: Medicare HMO

## 2020-12-18 ENCOUNTER — Other Ambulatory Visit: Payer: Self-pay

## 2020-12-18 DIAGNOSIS — Z20822 Contact with and (suspected) exposure to covid-19: Secondary | ICD-10-CM | POA: Diagnosis not present

## 2020-12-19 LAB — SARS-COV-2, NAA 2 DAY TAT

## 2020-12-19 LAB — NOVEL CORONAVIRUS, NAA: SARS-CoV-2, NAA: NOT DETECTED

## 2020-12-24 DIAGNOSIS — M5417 Radiculopathy, lumbosacral region: Secondary | ICD-10-CM | POA: Diagnosis not present

## 2020-12-24 DIAGNOSIS — M9902 Segmental and somatic dysfunction of thoracic region: Secondary | ICD-10-CM | POA: Diagnosis not present

## 2020-12-24 DIAGNOSIS — M9901 Segmental and somatic dysfunction of cervical region: Secondary | ICD-10-CM | POA: Diagnosis not present

## 2020-12-24 DIAGNOSIS — M9903 Segmental and somatic dysfunction of lumbar region: Secondary | ICD-10-CM | POA: Diagnosis not present

## 2020-12-24 DIAGNOSIS — M79602 Pain in left arm: Secondary | ICD-10-CM | POA: Diagnosis not present

## 2020-12-24 DIAGNOSIS — M62830 Muscle spasm of back: Secondary | ICD-10-CM | POA: Diagnosis not present

## 2020-12-24 DIAGNOSIS — M5413 Radiculopathy, cervicothoracic region: Secondary | ICD-10-CM | POA: Diagnosis not present

## 2021-01-02 DIAGNOSIS — H2511 Age-related nuclear cataract, right eye: Secondary | ICD-10-CM | POA: Diagnosis not present

## 2021-01-02 DIAGNOSIS — H11042 Peripheral pterygium, stationary, left eye: Secondary | ICD-10-CM | POA: Diagnosis not present

## 2021-01-02 DIAGNOSIS — H25812 Combined forms of age-related cataract, left eye: Secondary | ICD-10-CM | POA: Diagnosis not present

## 2021-01-02 DIAGNOSIS — H43813 Vitreous degeneration, bilateral: Secondary | ICD-10-CM | POA: Diagnosis not present

## 2021-01-09 ENCOUNTER — Telehealth: Payer: Self-pay | Admitting: Gastroenterology

## 2021-01-09 NOTE — Telephone Encounter (Signed)
Hi Dr. Silverio Decamp, this patient has been referred to you by her PCP for a repeat colon. Patient had colon in 2015. Records are in Epic under "Procedures" tab. Could you please review them and advise on scheduling. Thank you.

## 2021-01-10 NOTE — Telephone Encounter (Signed)
Ok to schedule direct colonoscopy, patient is due for surveillance colonoscopy, had adenomatous polyps removed in 2015. Thanks

## 2021-01-11 ENCOUNTER — Encounter: Payer: Self-pay | Admitting: Gastroenterology

## 2021-01-11 NOTE — Telephone Encounter (Signed)
Colon scheduled on 03/20/21 at 9:00am at [facility].

## 2021-01-21 DIAGNOSIS — M5417 Radiculopathy, lumbosacral region: Secondary | ICD-10-CM | POA: Diagnosis not present

## 2021-01-21 DIAGNOSIS — M62830 Muscle spasm of back: Secondary | ICD-10-CM | POA: Diagnosis not present

## 2021-01-21 DIAGNOSIS — M9902 Segmental and somatic dysfunction of thoracic region: Secondary | ICD-10-CM | POA: Diagnosis not present

## 2021-01-21 DIAGNOSIS — M9903 Segmental and somatic dysfunction of lumbar region: Secondary | ICD-10-CM | POA: Diagnosis not present

## 2021-01-21 DIAGNOSIS — M9901 Segmental and somatic dysfunction of cervical region: Secondary | ICD-10-CM | POA: Diagnosis not present

## 2021-01-21 DIAGNOSIS — M5413 Radiculopathy, cervicothoracic region: Secondary | ICD-10-CM | POA: Diagnosis not present

## 2021-01-21 DIAGNOSIS — M79602 Pain in left arm: Secondary | ICD-10-CM | POA: Diagnosis not present

## 2021-02-13 ENCOUNTER — Encounter: Payer: Self-pay | Admitting: Family Medicine

## 2021-02-26 ENCOUNTER — Encounter: Payer: Self-pay | Admitting: Family Medicine

## 2021-03-06 ENCOUNTER — Ambulatory Visit (AMBULATORY_SURGERY_CENTER): Payer: Self-pay | Admitting: *Deleted

## 2021-03-06 ENCOUNTER — Other Ambulatory Visit: Payer: Self-pay

## 2021-03-06 VITALS — Ht 59.0 in | Wt 126.0 lb

## 2021-03-06 DIAGNOSIS — Z86010 Personal history of colonic polyps: Secondary | ICD-10-CM

## 2021-03-06 MED ORDER — PEG 3350-KCL-NA BICARB-NACL 420 G PO SOLR: 4000.0000 mL | Freq: Once | ORAL | 0 refills | Status: AC

## 2021-03-06 NOTE — Progress Notes (Signed)
Patient is here in-person for PV. Patient denies any allergies to eggs or soy. Patient denies any problems with anesthesia/sedation. Patient denies any oxygen use at home. Patient denies taking any diet/weight loss medications or blood thinners. Patient is not being treated for MRSA or C-diff. Patient is aware of our care-partner policy and RXYVO-59 safety protocol. EMMI education assigned to the patient for the procedure, sent to [facility].   Patient is fully x4 COVID-19 vaccinated, per patient. Pt will call us back if she decides to use suprep, instructions given to pt.

## 2021-03-13 ENCOUNTER — Other Ambulatory Visit: Payer: Self-pay | Admitting: Family Medicine

## 2021-03-13 NOTE — Telephone Encounter (Signed)
Requesting: clonazepam Contract:  UDS: 07/27/19 Last Visit: 12/17/20 Next Visit: 03/25/21 Last Refill: 09/20/20  Please Advise

## 2021-03-20 ENCOUNTER — Other Ambulatory Visit: Payer: Self-pay

## 2021-03-20 ENCOUNTER — Ambulatory Visit (AMBULATORY_SURGERY_CENTER): Payer: Medicare HMO | Admitting: Gastroenterology

## 2021-03-20 ENCOUNTER — Encounter: Payer: Self-pay | Admitting: Gastroenterology

## 2021-03-20 VITALS — BP 142/76 | HR 63 | Temp 97.7°F | Resp 16 | Ht 59.0 in | Wt 126.0 lb

## 2021-03-20 DIAGNOSIS — D123 Benign neoplasm of transverse colon: Secondary | ICD-10-CM

## 2021-03-20 DIAGNOSIS — Z86010 Personal history of colonic polyps: Secondary | ICD-10-CM | POA: Diagnosis not present

## 2021-03-20 DIAGNOSIS — Z1211 Encounter for screening for malignant neoplasm of colon: Secondary | ICD-10-CM | POA: Diagnosis not present

## 2021-03-20 MED ORDER — SODIUM CHLORIDE 0.9 % IV SOLN: 500.0000 mL | Freq: Once | INTRAVENOUS | Status: DC

## 2021-03-20 NOTE — Op Note (Signed)
[place] Patient Name: Stephanie Barnes Procedure Date: 03/20/2021 9:05 AM MRN: [id number] Endoscopist: Mauri Pole , MD Age: 70 Referring MD:  Date of Birth: 10-02-1951 Gender: Female Account #: [id number] Procedure:                Colonoscopy Indications:              High risk colon cancer surveillance: Personal                            history of colonic polyps Medicines:                Monitored Anesthesia Care Procedure:                Pre-Anesthesia Assessment:                           - Prior to the procedure, a History and Physical                            was performed, and patient medications and                            allergies were reviewed. The patient's tolerance of                            previous anesthesia was also reviewed. The risks                            and benefits of the procedure and the sedation                            options and risks were discussed with the patient.                            All questions were answered, and informed consent                            was obtained. Prior Anticoagulants: The patient has                            taken no previous anticoagulant or antiplatelet                            agents. ASA Grade Assessment: II - A patient with                            mild systemic disease. After reviewing the risks                            and benefits, the patient was deemed in                            satisfactory condition to undergo the procedure.  After obtaining informed consent, the colonoscope                            was passed under direct vision. Throughout the                            procedure, the patient's blood pressure, pulse, and                            oxygen saturations were monitored continuously. The                            Olympus PFC-[id number] (#[id number]) Colonoscope was                            introduced through the anus and  advanced to the the                            cecum, identified by appendiceal orifice and                            ileocecal valve. The colonoscopy was performed                            without difficulty. The patient tolerated the                            procedure well. The quality of the bowel                            preparation was excellent. The ileocecal valve,                            appendiceal orifice, and rectum were photographed. Scope In: 9:09:41 AM Scope Out: 9:27:07 AM Scope Withdrawal Time: 0 hours 12 minutes 45 seconds  Total Procedure Duration: 0 hours 17 minutes 26 seconds  Findings:                 The perianal and digital rectal examinations were                            normal.                           Three sessile polyps were found in the transverse                            colon. The polyps were 3 to 8 mm in size. These                            polyps were removed with a cold snare. Resection                            and retrieval were complete.  A few small-mouthed diverticula were found in the                            sigmoid colon.                           Non-bleeding internal hemorrhoids were found during                            retroflexion. The hemorrhoids were medium-sized. Complications:            No immediate complications. Estimated Blood Loss:     Estimated blood loss was minimal. Impression:               - Three 3 to 8 mm polyps in the transverse colon,                            removed with a cold snare. Resected and retrieved.                           - Diverticulosis in the sigmoid colon.                           - Non-bleeding internal hemorrhoids. Recommendation:           - Patient has a contact number available for                            emergencies. The signs and symptoms of potential                            delayed complications were discussed with the                             patient. Return to normal activities tomorrow.                            Written discharge instructions were provided to the                            patient.                           - Resume previous diet.                           - Continue present medications.                           - Await pathology results.                           - Repeat colonoscopy in 3 - 5 years for                            surveillance based on pathology results. Mauri Pole, MD 03/20/2021 9:30:56 AM This report has  been signed electronically.

## 2021-03-20 NOTE — Progress Notes (Signed)
Pt's states no medical or surgical changes since previsit or office visit.  Check-in-AER  Vital signs-CW 

## 2021-03-20 NOTE — Progress Notes (Signed)
To PACU, VSS. Report to [email or web address] 

## 2021-03-20 NOTE — Patient Instructions (Signed)
Please read handouts provided. Continue present medications. Await pathology results.   YOU HAD AN ENDOSCOPIC PROCEDURE TODAY AT THE [facility]:   Refer to the procedure report that was given to you for any specific questions about what was found during the examination.  If the procedure report does not answer your questions, please call your gastroenterologist to clarify.  If you requested that your care partner not be given the details of your procedure findings, then the procedure report has been included in a sealed envelope for you to review at your convenience later.  YOU SHOULD EXPECT: Some feelings of bloating in the abdomen. Passage of more gas than usual.  Walking can help get rid of the air that was put into your GI tract during the procedure and reduce the bloating. If you had a lower endoscopy (such as a colonoscopy or flexible sigmoidoscopy) you may notice spotting of blood in your stool or on the toilet paper. If you underwent a bowel prep for your procedure, you may not have a normal bowel movement for a few days.  Please Note:  You might notice some irritation and congestion in your nose or some drainage.  This is from the oxygen used during your procedure.  There is no need for concern and it should clear up in a day or so.  SYMPTOMS TO REPORT IMMEDIATELY:  Following lower endoscopy (colonoscopy or flexible sigmoidoscopy):  Excessive amounts of blood in the stool  Significant tenderness or worsening of abdominal pains  Swelling of the abdomen that is new, acute  Fever of 100F or higher   For urgent or emergent issues, a gastroenterologist can be reached at any hour by calling [phone]. Do not use MyChart messaging for urgent concerns.    DIET:  We do recommend a small meal at first, but then you may proceed to your regular diet.  Drink plenty of fluids but you should avoid alcoholic beverages for 24 hours.  ACTIVITY:  You should plan to take it easy  for the rest of today and you should NOT DRIVE or use heavy machinery until tomorrow (because of the sedation medicines used during the test).    FOLLOW UP: Our staff will call the number listed on your records 48-72 hours following your procedure to check on you and address any questions or concerns that you may have regarding the information given to you following your procedure. If we do not reach you, we will leave a message.  We will attempt to reach you two times.  During this call, we will ask if you have developed any symptoms of COVID 19. If you develop any symptoms (ie: fever, flu-like symptoms, shortness of breath, cough etc.) before then, please call [phone].  If you test positive for Covid 19 in the 2 weeks post procedure, please call and report this information to us.    If any biopsies were taken you will be contacted by phone or by letter within the next 1-3 weeks.  Please call us at [phone] if you have not heard about the biopsies in 3 weeks.    SIGNATURES/CONFIDENTIALITY: You and/or your care partner have signed paperwork which will be entered into your electronic medical record.  These signatures attest to the fact that that the information above on your After Visit Summary has been reviewed and is understood.  Full responsibility of the confidentiality of this discharge information lies with you and/or your care-partner.  

## 2021-03-20 NOTE — Progress Notes (Signed)
Called to room to assist during endoscopic procedure.  Patient ID and intended procedure confirmed with present staff. Received instructions for my participation in the procedure from the performing physician.  

## 2021-03-22 ENCOUNTER — Telehealth: Payer: Self-pay

## 2021-03-22 NOTE — Telephone Encounter (Signed)
NO ANSWER, MESSAGE LEFT FOR PATIENT. 

## 2021-03-22 NOTE — Telephone Encounter (Signed)
LVM

## 2021-03-25 ENCOUNTER — Ambulatory Visit: Payer: Medicare HMO | Admitting: Family Medicine

## 2021-03-25 ENCOUNTER — Other Ambulatory Visit: Payer: Self-pay

## 2021-03-25 DIAGNOSIS — E559 Vitamin D deficiency, unspecified: Secondary | ICD-10-CM

## 2021-03-25 DIAGNOSIS — E119 Type 2 diabetes mellitus without complications: Secondary | ICD-10-CM

## 2021-03-25 DIAGNOSIS — Z79899 Other long term (current) drug therapy: Secondary | ICD-10-CM

## 2021-03-25 DIAGNOSIS — Z1231 Encounter for screening mammogram for malignant neoplasm of breast: Secondary | ICD-10-CM

## 2021-03-25 DIAGNOSIS — K5790 Diverticulosis of intestine, part unspecified, without perforation or abscess without bleeding: Secondary | ICD-10-CM | POA: Insufficient documentation

## 2021-03-25 DIAGNOSIS — G90513 Complex regional pain syndrome I of upper limb, bilateral: Secondary | ICD-10-CM

## 2021-03-25 DIAGNOSIS — M8580 Other specified disorders of bone density and structure, unspecified site: Secondary | ICD-10-CM

## 2021-03-25 DIAGNOSIS — E782 Mixed hyperlipidemia: Secondary | ICD-10-CM

## 2021-03-25 DIAGNOSIS — F418 Other specified anxiety disorders: Secondary | ICD-10-CM

## 2021-03-25 NOTE — Assessment & Plan Note (Signed)
Encouraged to get adequate exercise, calcium and vitamin d intake 

## 2021-03-25 NOTE — Assessment & Plan Note (Signed)
Encouraged heart healthy diet, increase exercise, avoid trans fats, consider a krill oil cap daily 

## 2021-03-25 NOTE — Assessment & Plan Note (Signed)
Supplement and monitor 

## 2021-03-25 NOTE — Assessment & Plan Note (Signed)
Her niece and her son have finally gotten a place of their own and moved out. 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 She is also working a part time job every day again

## 2021-03-25 NOTE — Patient Instructions (Signed)

## 2021-03-25 NOTE — Assessment & Plan Note (Signed)
She has worked very hard to manage her pain and regain her strength and is able to work and golf again as a result

## 2021-03-25 NOTE — Progress Notes (Signed)
Patient ID: Stephanie Barnes, female    DOB: 1951/02/22  Age: 70 y.o. MRN: [id number]    Subjective:  Subjective  HPI Stephanie Barnes presents for office visit today for management of tendonitis and her recent diet changes. She reports feeling well and has no recent sicknesses to report. She denies any chest pain, SOB, fever, abdominal pain, cough, chills, sore throat, dysuria, urinary incontinence, back pain, HA, or N/VD. She reports that she had to get a part time job to help her pay her medical bills. She reports that she has had a colonoscopy about 3 weeks ago. She reports that she used to eat a lot of bananas, sweet potatoes, and dried fruits, but have recently cut down on these foods. She endorses that her diet is clean and avoiding processed foods.   Review of Systems  Constitutional: Negative for chills, fatigue and fever.  HENT: Negative for congestion, rhinorrhea, sinus pressure, sinus pain and sore throat.   Eyes: Negative for pain.  Respiratory: Negative for cough and shortness of breath.   Cardiovascular: Negative for chest pain, palpitations and leg swelling.  Gastrointestinal: Negative for abdominal pain, blood in stool, diarrhea, nausea and vomiting.  Genitourinary: Negative for decreased urine volume, flank pain, frequency, vaginal bleeding and vaginal discharge.  Musculoskeletal: Negative for back pain.  Neurological: Negative for headaches.    History Past Medical History:  Diagnosis Date  . Abnormal EKG 06/13/2013  . Anemia 01/13/2016  . Anxiety   . Arthritis   . Complex regional pain syndrome 01/01/2016  . Depression   . 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 Distal radius fracture, left   . 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 Medicare annual wellness visit, subsequent 10/02/2015  . Multiple allergies 03/31/2016  . Proximal humerus fracture    Left  . Rotator cuff tear   . 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 Welcome to  Medicare preventive visit 10/02/2015    She has a past surgical history that includes Tonsillectomy; Dental surgery; Refractive surgery (Bilateral, 2000); Rotator cuff repair (Right, 2017); Mouth surgery (08/10/2017); LASIK; Reverse shoulder arthroplasty (Left, 03/15/2019); Open reduction internal fixation (orif) distal radial fracture (Left, 03/15/2019); and Colonoscopy (03/31/2014).   Her family history includes Alcohol abuse in her brother; Arthritis in her mother; COPD in her father and paternal aunt; Colon polyps in her father; Heart disease in her father, maternal aunt, maternal grandfather, maternal grandmother, maternal uncle, mother, paternal aunt, paternal grandfather, paternal grandmother, and paternal uncle; Hypertension in her brother; Mental illness in her brother; Suicidality in her brother.She reports that she has never smoked. She has never used smokeless tobacco. She reports current alcohol use of about 1.0 standard drink of alcohol per week. She reports that she does not use drugs.  Current Outpatient Medications on File Prior to Visit  Medication Sig Dispense Refill  . amoxicillin (AMOXIL) 500 MG capsule Take 4 capsules by mouth. 1 hour prior to dental procedure     . amoxicillin (AMOXIL) 500 MG tablet amoxicillin 500 mg tablet  TAKE FOUR TABLETS BY MOUTH ONE HOUR BEFORE DENTAL APPOINTMENT    . Ascorbic Acid (VITAMIN C) 1000 MG tablet Take 1,000 mg by mouth daily.    Marland Kitchen buPROPion (WELLBUTRIN) 75 MG tablet Take 0.5 tablets (37.5 mg total) by mouth daily. 45 tablet 2  . Calcium-Magnesium 500-250 MG TABS Take 1 tablet by mouth in the morning and at bedtime.    . cetirizine (ZYRTEC)  10 MG tablet Take 10 mg by mouth daily as needed for allergies.     . clonazePAM (KLONOPIN) 1 MG tablet TAKE 1 TABLET BY MOUTH ONCE DAILY AT BEDTIME 90 tablet 0  . Cyanocobalamin (VITAMIN B 12 PO) Take 1,000 mg by mouth daily.     Marland Kitchen HYDROmorphone (DILAUDID) 2 MG tablet Take 1-2 tablets (2-4 mg total) by mouth  every 4 (four) hours as needed for moderate pain or severe pain (2 mg for moderate pain and 4 mg for severe pain.). 50 tablet 0  . Krill Oil 1000 MG CAPS Take 1 capsule by mouth daily.    . Melatonin-Pyridoxine (MELATONEX) 3-10 MG TBCR Take 1 tablet by mouth at bedtime.    . mometasone (ELOCON) 0.1 % ointment Apply topically daily. As needed 15 g 2  . OVER THE COUNTER MEDICATION Apply 1 application topically as needed (pain). Hemp lotion    . Probiotic Product (PROBIOTIC DAILY PO) Take 1 tablet by mouth daily. NOW 10    . TURMERIC PO Take 894 mg by mouth 2 (two) times daily. GAIA brand-Take 1 in the morning and 1 at night.    Marland Kitchen UNABLE TO FIND Take 1 tablet by mouth daily. Med Name: Zinc 15mg  with Selenium 200mg     . gabapentin (NEURONTIN) 300 MG capsule Take 1 capsule (300 mg total) by mouth 3 (three) times daily for 21 days. 63 capsule 0   No current facility-administered medications on file prior to visit.     Objective:  Objective  Physical Exam Constitutional:      General: She is not in acute distress.    Appearance: Normal appearance. She is not ill-appearing or toxic-appearing.  HENT:     Head: Normocephalic and atraumatic.     Right Ear: Tympanic membrane, ear canal and external ear normal.     Left Ear: Tympanic membrane, ear canal and external ear normal.     Nose: No congestion or rhinorrhea.  Eyes:     Extraocular Movements: Extraocular movements intact.     Pupils: Pupils are equal, round, and reactive to light.  Cardiovascular:     Rate and Rhythm: Normal rate and regular rhythm.     Pulses: Normal pulses.     Heart sounds: Normal heart sounds. No murmur heard.   Pulmonary:     Effort: Pulmonary effort is normal. No respiratory distress.     Breath sounds: Normal breath sounds. No wheezing, rhonchi or rales.  Abdominal:     General: Bowel sounds are normal.     Palpations: Abdomen is soft. There is no mass.     Tenderness: There is no abdominal tenderness. There  is no guarding.     Hernia: No hernia is present.  Musculoskeletal:        General: Normal range of motion.     Cervical back: Normal range of motion and neck supple.  Skin:    General: Skin is warm and dry.  Neurological:     Mental Status: She is alert and oriented to person, place, and time.  Psychiatric:        Behavior: Behavior normal.    There were no vitals taken for this visit. Wt Readings from Last 3 Encounters:  03/20/21 126 lb (57.2 kg)  03/06/21 126 lb (57.2 kg)  09/20/20 128 lb 9.6 oz (58.3 kg)     Lab Results  Component Value Date   WBC 4.6 09/20/2020   HGB 12.7 09/20/2020   HCT 38.1 09/20/2020  PLT 196.0 09/20/2020   GLUCOSE 79 10/25/2020   CHOL 182 09/20/2020   TRIG 120.0 09/20/2020   HDL 73.10 09/20/2020   LDLCALC 85 09/20/2020   ALT 13 10/25/2020   AST 16 10/25/2020   NA 140 10/25/2020   K 4.7 10/25/2020   CL 103 10/25/2020   CREATININE 1.01 10/25/2020   BUN 22 10/25/2020   CO2 31 10/25/2020   TSH 2.18 09/20/2020    US Venous Img Upper Uni Left  Result Date: 04/22/2019 CLINICAL DATA:  70 year old female with a history of pain and swelling EXAM: LEFT UPPER EXTREMITY VENOUS DOPPLER ULTRASOUND TECHNIQUE: Gray-scale sonography with graded compression, as well as color Doppler and duplex ultrasound were performed to evaluate the upper extremity deep venous system from the level of the subclavian vein and including the jugular, axillary, basilic, radial, ulnar and upper cephalic vein. Spectral Doppler was utilized to evaluate flow at rest and with distal augmentation maneuvers. COMPARISON:  None. FINDINGS: Contralateral Subclavian Vein: Respiratory phasicity is normal and symmetric with the symptomatic side. No evidence of thrombus. Normal compressibility. Internal Jugular Vein: No evidence of thrombus. Normal compressibility, respiratory phasicity and response to augmentation. Subclavian Vein: No evidence of thrombus. Normal compressibility, respiratory  phasicity and response to augmentation. Axillary Vein: No evidence of thrombus. Normal compressibility, respiratory phasicity and response to augmentation. Cephalic Vein: No evidence of thrombus. Normal compressibility, respiratory phasicity and response to augmentation. Basilic Vein: No evidence of thrombus. Normal compressibility, respiratory phasicity and response to augmentation. Brachial Veins: No evidence of thrombus. Normal compressibility, respiratory phasicity and response to augmentation. Radial Veins: No evidence of thrombus. Normal compressibility, respiratory phasicity and response to augmentation. Ulnar Veins: No evidence of thrombus. Normal compressibility, respiratory phasicity and response to augmentation. Other Findings:  Edema IMPRESSION: Sonographic survey of the left upper extremity negative for DVT. Edema Electronically Signed   By: Corrie Mckusick D.O.   On: 04/22/2019 16:34     Assessment & Plan:  Plan    No orders of the defined types were placed in this encounter.   Problem List Items Addressed This Visit    Depression with anxiety    Her niece and her son have finally gotten a place of their own and moved out. 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 She is also working a part time job every day again      Hyperlipidemia    Encouraged heart healthy diet, increase exercise, avoid trans fats, consider a krill oil cap daily      Osteopenia    Encouraged to get adequate exercise, calcium and vitamin d intake      CRPS (complex regional pain syndrome), upper limb    She has worked very hard to manage her pain and regain her strength and is able to work and golf again as a result      Vitamin D deficiency    Supplement and monitor      Diverticulosis    She has been trying to minimize her nuts and seeds and trying to chew better. Hydrate well and increase fiber intake       Other Visit Diagnoses    High risk medication  use    -  Primary   Relevant Orders   DRUG MONITORING, PANEL 8 WITH CONFIRMATION, URINE   Diabetes mellitus without complication ([place])       Breast cancer screening by mammogram  Relevant Orders   MM Digital Screening      Follow-up: Return in about 6 months (around 09/25/2021) for annual exam.   I,David Hanna,acting as a scribe for Penni Homans, MD.,have documented all relevant documentation on the behalf of Penni Homans, MD,as directed by  Penni Homans, MD while in the presence of Penni Homans, MD.  I, Mosie Lukes, MD personally performed the services described in this documentation. All medical record entries made by the scribe were at my direction and in my presence. I have reviewed the chart and agree that the record reflects my personal performance and is accurate and complete

## 2021-03-25 NOTE — Assessment & Plan Note (Addendum)
She has been trying to minimize her nuts and seeds and trying to chew better. Hydrate well and increase fiber intake

## 2021-03-28 LAB — DRUG MONITORING, PANEL 8 WITH CONFIRMATION, URINE
6 Acetylmorphine: NEGATIVE ng/mL (ref <10)
Alcohol Metabolites: NEGATIVE ng/mL
Alphahydroxyalprazolam: NEGATIVE ng/mL (ref <25)
Alphahydroxymidazolam: NEGATIVE ng/mL (ref <50)
Alphahydroxytriazolam: NEGATIVE ng/mL (ref <50)
Aminoclonazepam: 514 ng/mL — ABNORMAL HIGH (ref <25)
Amphetamines: NEGATIVE ng/mL (ref <500)
Benzodiazepines: POSITIVE ng/mL — AB (ref <100)
Buprenorphine, Urine: NEGATIVE ng/mL (ref <5)
Cocaine Metabolite: NEGATIVE ng/mL (ref <150)
Codeine: NEGATIVE ng/mL (ref <50)
Creatinine: 160.1 mg/dL
Hydrocodone: NEGATIVE ng/mL (ref <50)
Hydromorphone: 3250 ng/mL — ABNORMAL HIGH (ref <50)
Hydroxyethylflurazepam: NEGATIVE ng/mL (ref <50)
Lorazepam: NEGATIVE ng/mL (ref <50)
MDMA: NEGATIVE ng/mL (ref <500)
Marijuana Metabolite: NEGATIVE ng/mL (ref <20)
Morphine: NEGATIVE ng/mL (ref <50)
Nordiazepam: NEGATIVE ng/mL (ref <50)
Norhydrocodone: NEGATIVE ng/mL (ref <50)
Opiates: POSITIVE ng/mL — AB (ref <100)
Oxazepam: NEGATIVE ng/mL (ref <50)
Oxidant: NEGATIVE ug/mL
Oxycodone: NEGATIVE ng/mL (ref <100)
Temazepam: NEGATIVE ng/mL (ref <50)
pH: 5.5 (ref 4.5–9.0)

## 2021-03-28 LAB — DM TEMPLATE

## 2021-03-29 ENCOUNTER — Encounter: Payer: Self-pay | Admitting: Gastroenterology

## 2021-03-31 ENCOUNTER — Encounter: Payer: Self-pay | Admitting: Family Medicine

## 2021-04-01 ENCOUNTER — Other Ambulatory Visit: Payer: Self-pay | Admitting: *Deleted

## 2021-04-01 MED ORDER — MOMETASONE FUROATE 0.1 % EX OINT: TOPICAL_OINTMENT | Freq: Every day | CUTANEOUS | 2 refills | Status: AC

## 2021-04-09 ENCOUNTER — Encounter: Payer: Self-pay | Admitting: Family Medicine

## 2021-04-16 ENCOUNTER — Telehealth: Payer: Self-pay

## 2021-04-16 NOTE — Telephone Encounter (Signed)
Pt would like to speak to you.

## 2021-04-16 NOTE — Telephone Encounter (Signed)
Just seeing this in the late evening. Please call and have her do a PCR test. She can go to [email or web address] and get one done first thing. If it is positive then we can treat with Paxlovid. You can usually get an appt set up within the hour. If she is still feeling badly we could also send in an antibiotic to treat a possible strep throat. She should increase rest and hydration, add probiotics, zinc such as Coldeze or Xicam. Treat fevers as needed

## 2021-04-16 NOTE — Telephone Encounter (Signed)
Pt is requesting a personal call back from PCP.  She is experiencing a sore throat and exhaustion.  She took an at home Covid test which came back negative and has another one in the pack to take in a few days.  She called out of work today and has been given permission to work from home a few days this week, but feels so bad today.  She stated she did sleep well through the night last night.  She has had all her Covid vaccines including boosters, last one being in April.  She has had no known exposures to Covid.  Pt did play golf on Saturday.  She is requesting any recommendations PCP can give her regarding care and what to do for this in her current state.

## 2021-04-17 ENCOUNTER — Encounter: Payer: Self-pay | Admitting: Family Medicine

## 2021-04-17 ENCOUNTER — Ambulatory Visit: Payer: Medicare HMO | Attending: Internal Medicine

## 2021-04-17 ENCOUNTER — Other Ambulatory Visit: Payer: Self-pay | Admitting: Family Medicine

## 2021-04-17 DIAGNOSIS — Z20822 Contact with and (suspected) exposure to covid-19: Secondary | ICD-10-CM | POA: Diagnosis not present

## 2021-04-17 NOTE — Telephone Encounter (Signed)
Patient has covid test set up for 1130 today.  She had a question about antibiotic, should she wait til after test results to get it or should she start now.  She was worried about having to wait a couple of days for test results to come back.

## 2021-04-17 NOTE — Telephone Encounter (Signed)
I saw the mychart and advised her of what was noted by Dr. Charlett Blake patient will call tomorrow is throat is still hurting bad.  She stated that today that she just still have sore throat and feeling lethargic.

## 2021-04-17 NOTE — Telephone Encounter (Signed)
See phone note

## 2021-04-17 NOTE — Telephone Encounter (Signed)
I have sent in Amoxicillin, if she is feeling better she does not have to take it and if she tests positive for COVID she does not have to take it but it is their if she needs it

## 2021-04-18 LAB — NOVEL CORONAVIRUS, NAA: SARS-CoV-2, NAA: NOT DETECTED

## 2021-04-18 LAB — SARS-COV-2, NAA 2 DAY TAT

## 2021-04-18 NOTE — Telephone Encounter (Signed)
Patient is feeling a little better today, just a little fatigue.  She would like to hold off on amoxicillin now and wait for results.    The medication was not sent in initially but if she calls back and need it can you let me know the dose, sig, qty.

## 2021-04-24 ENCOUNTER — Ambulatory Visit (INDEPENDENT_AMBULATORY_CARE_PROVIDER_SITE_OTHER): Payer: Medicare HMO

## 2021-04-24 VITALS — Ht 59.0 in | Wt 126.0 lb

## 2021-04-24 DIAGNOSIS — Z780 Asymptomatic menopausal state: Secondary | ICD-10-CM

## 2021-04-24 DIAGNOSIS — Z0000 Encounter for general adult medical examination without abnormal findings: Secondary | ICD-10-CM | POA: Diagnosis not present

## 2021-04-24 NOTE — Patient Instructions (Signed)
Stephanie Barnes , Thank you for taking time to complete your Medicare Wellness Visit. I appreciate your ongoing commitment to your health goals. Please review the following plan we discussed and let me know if I can assist you in the future.   Screening recommendations/referrals: Colonoscopy: Completed 03/20/2021- Due 03/20/2024 Mammogram: Scheduled for 06/13/21 Bone Density: Ordered today. Recommended yearly ophthalmology/optometry visit for glaucoma screening and checkup Recommended yearly dental visit for hygiene and checkup  Vaccinations: Influenza vaccine: Up to date Pneumococcal vaccine: Up to date Tdap vaccine: Up to date-Due 06/14/2023 Shingles vaccine: Completed vaccines   Covid-19:Up to date  Advanced directives: Copy in chart  Conditions/risks identified: See problem list  Next appointment: Follow up in one year for your annual wellness visit 04/29/2022 @ 2:20   Preventive Care 71 Years and Older, Female Preventive care refers to lifestyle choices and visits with your health care provider that can promote health and wellness. What does preventive care include? A yearly physical exam. This is also called an annual well check. Dental exams once or twice a year. Routine eye exams. Ask your health care provider how often you should have your eyes checked. Personal lifestyle choices, including: Daily care of your teeth and gums. Regular physical activity. Eating a healthy diet. Avoiding tobacco and drug use. Limiting alcohol use. Practicing safe sex. Taking low-dose aspirin every day. Taking vitamin and mineral supplements as recommended by your health care provider. What happens during an annual well check? The services and screenings done by your health care provider during your annual well check will depend on your age, overall health, lifestyle risk factors, and family history of disease. Counseling  Your health care provider may ask you questions about your: Alcohol  use. Tobacco use. Drug use. Emotional well-being. Home and relationship well-being. Sexual activity. Eating habits. History of falls. Memory and ability to understand (cognition). Work and work Statistician. Reproductive health. Screening  You may have the following tests or measurements: Height, weight, and BMI. Blood pressure. Lipid and cholesterol levels. These may be checked every 5 years, or more frequently if you are over 13 years old. Skin check. Lung cancer screening. You may have this screening every year starting at age 71 if you have a 30-pack-year history of smoking and currently smoke or have quit within the past 15 years. Fecal occult blood test (FOBT) of the stool. You may have this test every year starting at age 71. Flexible sigmoidoscopy or colonoscopy. You may have a sigmoidoscopy every 5 years or a colonoscopy every 10 years starting at age 71. Hepatitis C blood test. Hepatitis B blood test. Sexually transmitted disease (STD) testing. Diabetes screening. This is done by checking your blood sugar (glucose) after you have not eaten for a while (fasting). You may have this done every 1-3 years. Bone density scan. This is done to screen for osteoporosis. You may have this done starting at age 71. Mammogram. This may be done every 1-2 years. Talk to your health care provider about how often you should have regular mammograms. Talk with your health care provider about your test results, treatment options, and if necessary, the need for more tests. Vaccines  Your health care provider may recommend certain vaccines, such as: Influenza vaccine. This is recommended every year. Tetanus, diphtheria, and acellular pertussis (Tdap, Td) vaccine. You may need a Td booster every 10 years. Zoster vaccine. You may need this after age 71. Pneumococcal 13-valent conjugate (PCV13) vaccine. One dose is recommended after age 71. Pneumococcal polysaccharide (  PPSV23) vaccine. One dose is  recommended after age 71. Talk to your health care provider about which screenings and vaccines you need and how often you need them. This information is not intended to replace advice given to you by your health care provider. Make sure you discuss any questions you have with your health care provider. Document Released: 11/23/2015 Document Revised: 07/16/2016 Document Reviewed: 08/28/2015 Elsevier Interactive Patient Education  2017 [place] Prevention in the Home Falls can cause injuries. They can happen to people of all ages. There are many things you can do to make your home safe and to help prevent falls. What can I do on the outside of my home? Regularly fix the edges of walkways and driveways and fix any cracks. Remove anything that might make you trip as you walk through a door, such as a raised step or threshold. Trim any bushes or trees on the path to your home. Use bright outdoor lighting. Clear any walking paths of anything that might make someone trip, such as rocks or tools. Regularly check to see if handrails are loose or broken. Make sure that both sides of any steps have handrails. Any raised decks and porches should have guardrails on the edges. Have any leaves, snow, or ice cleared regularly. Use sand or salt on walking paths during winter. Clean up any spills in your garage right away. This includes oil or grease spills. What can I do in the bathroom? Use night lights. Install grab bars by the toilet and in the tub and shower. Do not use towel bars as grab bars. Use non-skid mats or decals in the tub or shower. If you need to sit down in the shower, use a plastic, non-slip stool. Keep the floor dry. Clean up any water that spills on the floor as soon as it happens. Remove soap buildup in the tub or shower regularly. Attach bath mats securely with double-sided non-slip rug tape. Do not have throw rugs and other things on the floor that can make you  trip. What can I do in the bedroom? Use night lights. Make sure that you have a light by your bed that is easy to reach. Do not use any sheets or blankets that are too big for your bed. They should not hang down onto the floor. Have a firm chair that has side arms. You can use this for support while you get dressed. Do not have throw rugs and other things on the floor that can make you trip. What can I do in the kitchen? Clean up any spills right away. Avoid walking on wet floors. Keep items that you use a lot in easy-to-reach places. If you need to reach something above you, use a strong step stool that has a grab bar. Keep electrical cords out of the way. Do not use floor polish or wax that makes floors slippery. If you must use wax, use non-skid floor wax. Do not have throw rugs and other things on the floor that can make you trip. What can I do with my stairs? Do not leave any items on the stairs. Make sure that there are handrails on both sides of the stairs and use them. Fix handrails that are broken or loose. Make sure that handrails are as long as the stairways. Check any carpeting to make sure that it is firmly attached to the stairs. Fix any carpet that is loose or worn. Avoid having throw rugs at the top or  bottom of the stairs. If you do have throw rugs, attach them to the floor with carpet tape. Make sure that you have a light switch at the top of the stairs and the bottom of the stairs. If you do not have them, ask someone to add them for you. What else can I do to help prevent falls? Wear shoes that: Do not have high heels. Have rubber bottoms. Are comfortable and fit you well. Are closed at the toe. Do not wear sandals. If you use a stepladder: Make sure that it is fully opened. Do not climb a closed stepladder. Make sure that both sides of the stepladder are locked into place. Ask someone to hold it for you, if possible. Clearly mark and make sure that you can  see: Any grab bars or handrails. First and last steps. Where the edge of each step is. Use tools that help you move around (mobility aids) if they are needed. These include: Canes. Walkers. Scooters. Crutches. Turn on the lights when you go into a dark area. Replace any light bulbs as soon as they burn out. Set up your furniture so you have a clear path. Avoid moving your furniture around. If any of your floors are uneven, fix them. If there are any pets around you, be aware of where they are. Review your medicines with your doctor. Some medicines can make you feel dizzy. This can increase your chance of falling. Ask your doctor what other things that you can do to help prevent falls. This information is not intended to replace advice given to you by your health care provider. Make sure you discuss any questions you have with your health care provider. Document Released: 08/23/2009 Document Revised: 04/03/2016 Document Reviewed: 12/01/2014 Elsevier Interactive Patient Education  2017 [employer].

## 2021-04-24 NOTE — Progress Notes (Signed)
**Note Stephanie-Identified via Obfuscation** Subjective:   Stephanie Barnes is a 70 y.o. female who presents for Medicare Annual (Subsequent) preventive examination.  I connected with Camilla today by telephone and verified that I am speaking with the correct person using two identifiers. Location patient: home Location provider: work Persons participating in the virtual visit: patient, Marine scientist.    I discussed the limitations, risks, security and privacy concerns of performing an evaluation and management service by telephone and the availability of in person appointments. I also discussed with the patient that there may be a patient responsible charge related to this service. The patient expressed understanding and verbally consented to this telephonic visit.    Interactive audio and video telecommunications were attempted between this provider and patient, however failed, due to patient having technical difficulties OR patient did not have access to video capability.  We continued and completed visit with audio only.  Some vital signs may be absent or patient reported.   Time Spent with patient on telephone encounter: 25 minutes   Review of Systems     Cardiac Risk Factors include: advanced age (>34men, >62 women);dyslipidemia     Objective:    Today's Vitals   04/24/21 1457  Weight: 126 lb (57.2 kg)  Height: 4\' 11"  (1.499 m)   Body mass index is 25.45 kg/m.  Advanced Directives 04/24/2021 08/12/2019 03/15/2019 03/10/2019 04/01/2018 03/30/2017 09/04/2016  Does Patient Have a Medical Advance Directive? Yes Yes Yes Yes Yes Yes Yes  Type of Paramedic of [place];Living will [place];Living will [place];Living will [place];Living will [place];Living will [place];Living will Living will  Does patient want to make changes to medical advance directive? - No - Patient declined No - Patient declined No -  Patient declined No - Patient declined No - Patient declined No - Patient declined  Copy of [place] in Chart? Yes - validated most recent copy scanned in chart (See row information) Yes - validated most recent copy scanned in chart (See row information) - No - copy requested Yes No - copy requested No - copy requested  Would patient like information on creating a medical advance directive? - - - - - - -    Current Medications (verified) Outpatient Encounter Medications as of 04/24/2021  Medication Sig   amoxicillin (AMOXIL) 500 MG capsule Take 4 capsules by mouth. 1 hour prior to dental procedure    Ascorbic Acid (VITAMIN C) 1000 MG tablet Take 1,000 mg by mouth daily.   buPROPion (WELLBUTRIN) 75 MG tablet Take 0.5 tablets (37.5 mg total) by mouth daily.   Calcium-Magnesium 500-250 MG TABS Take 1 tablet by mouth in the morning and at bedtime.   cetirizine (ZYRTEC) 10 MG tablet Take 10 mg by mouth daily as needed for allergies.    clonazePAM (KLONOPIN) 1 MG tablet TAKE 1 TABLET BY MOUTH ONCE DAILY AT BEDTIME   Cyanocobalamin (VITAMIN B 12 PO) Take 1,000 mg by mouth daily.    HYDROmorphone (DILAUDID) 2 MG tablet Take 1-2 tablets (2-4 mg total) by mouth every 4 (four) hours as needed for moderate pain or severe pain (2 mg for moderate pain and 4 mg for severe pain.).   Krill Oil 1000 MG CAPS Take 1 capsule by mouth daily.   Melatonin-Pyridoxine (MELATONEX) 3-10 MG TBCR Take 1 tablet by mouth at bedtime.   mometasone (ELOCON) 0.1 % ointment Apply topically daily. As needed   OVER  THE COUNTER MEDICATION Apply 1 application topically as needed (pain). Hemp lotion   Probiotic Product (PROBIOTIC DAILY PO) Take 1 tablet by mouth daily. NOW 10   TURMERIC PO Take 894 mg by mouth 2 (two) times daily. GAIA brand-Take 1 in the morning and 1 at night.   UNABLE TO FIND Take 1 tablet by mouth daily. Med Name: Zinc 15mg  with Selenium 200mg    gabapentin (NEURONTIN) 300 MG capsule Take 1  capsule (300 mg total) by mouth 3 (three) times daily for 21 days.   No facility-administered encounter medications on file as of 04/24/2021.    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   Welcome to Medicare preventive visit 10/02/2015   Past Surgical History:  Procedure Laterality Date   COLONOSCOPY  03/31/2014   Dr.Mann-polyp   DENTAL SURGERY     Root cleaning    LASIK     MOUTH SURGERY  08/10/2017   front lower   OPEN REDUCTION INTERNAL FIXATION (ORIF) DISTAL RADIAL FRACTURE Left 03/15/2019   Procedure: OPEN REDUCTION INTERNAL FIXATION (ORIF) DISTAL RADIAL FRACTURE;  Surgeon: Roseanne Kaufman, MD;  Location: [facility];  Service: Orthopedics;  Laterality: Left;   REFRACTIVE SURGERY Bilateral 2000   REVERSE SHOULDER ARTHROPLASTY Left 03/15/2019   Procedure: REVERSE SHOULDER ARTHROPLASTY;  Surgeon: Nicholes Stairs, MD;  Location: [facility] [facility];  Service: Orthopedics;  Laterality: Left;  231min   ROTATOR CUFF REPAIR Right 2017   TONSILLECTOMY     Family History  Problem Relation Age of Onset   Heart disease Mother    Arthritis Mother    Heart disease Father    COPD Father    Colon polyps Father    Mental illness Brother        bipolar   Hypertension Brother    Suicidality Brother    Alcohol abuse Brother    Heart disease Maternal Aunt     Heart disease Maternal Uncle    Heart disease Paternal Aunt    COPD Paternal Aunt    Heart disease Paternal Uncle    Heart disease Maternal Grandmother    Heart disease Maternal Grandfather    Heart disease Paternal Grandmother    Heart disease Paternal Grandfather    Colon cancer Neg Hx    Esophageal cancer Neg Hx    Stomach cancer Neg Hx    Rectal cancer Neg Hx    Social History   Socioeconomic History   Marital status: Single    Spouse name: Not on file   Number of children: Not on file   Years of education: Not on file   Highest education level: Not on file  Occupational History   Not on file  Tobacco Use   Smoking status: Never   Smokeless tobacco: Never  Vaping Use   Vaping Use: Never used  Substance and Sexual Activity  Alcohol use: Yes    Alcohol/week: 1.0 standard drink    Types: 1 Glasses of wine per week    Comment: per week   Drug use: No   Sexual activity: Not Currently    Comment: lives alone, retired from [employer], no dietary restrictions  Other Topics Concern   Not on file  Social History Narrative   Not on file   Social Determinants of Health   Financial Resource Strain: Low Risk    Difficulty of Paying Living Expenses: Not hard at all  Food Insecurity: No Food Insecurity   Worried About Charity fundraiser in the Last Year: Never true   Arboriculturist in the Last Year: Never true  Transportation Needs: No Transportation Needs   Lack of Transportation (Medical): No   Lack of Transportation (Non-Medical): No  Physical Activity: Insufficiently Active   Days of Exercise per Week: 1 day   Minutes of Exercise per Session: 60 min  Stress: No Stress Concern Present   Feeling of Stress : Not at all  Social Connections: Socially Isolated   Frequency of Communication with Friends and Family: More than three times a week   Frequency of Social Gatherings with Friends and Family: Once a week   Attends Religious Services: Never   Building surveyor or Organizations: No   Attends Music therapist: Never   Marital Status: Never married    Tobacco Counseling Counseling given: Not Answered   Clinical Intake:  Pre-visit preparation completed: Yes  Pain : 0-10 Pain Type: Chronic pain Pain Location: Hand (shoulder) Pain Onset: More than a month ago Pain Frequency: Constant     Nutritional Status: BMI 25 -29 Overweight Nutritional Risks: None Diabetes: No  How often do you need to have someone help you when you read instructions, pamphlets, or other written materials from your doctor or pharmacy?: 1 - Never  Diabetic?No  Interpreter Needed?: No  Information entered by :: Caroleen Hamman LPN   Activities of Daily Living In your present state of health, do you have any difficulty performing the following activities: 04/24/2021 12/17/2020  Hearing? N N  Vision? N N  Difficulty concentrating or making decisions? N N  Walking or climbing stairs? N N  Dressing or bathing? N N  Doing errands, shopping? N N  Preparing Food and eating ? N -  Using the Toilet? N -  In the past six months, have you accidently leaked urine? N -  Do you have problems with loss of bowel control? N -  Managing your Medications? N -  Managing your Finances? N -  Housekeeping or managing your Housekeeping? N -  Some recent data might be hidden    Patient Care Team: Mosie Lukes, MD as PCP - General (Family Medicine) Juanita Craver, MD as Consulting Physician (Gastroenterology) Percival Spanish, PT as Physical Therapist (Physical Therapy) Susa Day, MD as Consulting Physician (Orthopedic Surgery)  Indicate any recent Medical Services you may have received from other than [facility] providers in the past year (date may be approximate).     Assessment:   This is a routine wellness examination for Stephanie Barnes.  Hearing/Vision screen Hearing Screening - Comments:: No issues Vision Screening - Comments:: Wears glasses Last  eye exam-12/2020- [facility]  Dietary issues and exercise activities discussed: Current Exercise Habits: Home exercise routine, Type of exercise: Other - see comments (golf), Time (Minutes): 60, Frequency (Times/Week): 1, Weekly Exercise (Minutes/Week): 60, Intensity: Mild,  Exercise limited by: None identified   Goals Addressed               This Visit's Progress     Patient Stated     Maintain healthy eating lifestyle. (pt-stated)   On track      Depression Screen PHQ 2/9 Scores 04/24/2021 03/25/2021 08/12/2019 04/01/2018 03/30/2017 09/04/2016 09/13/2015  PHQ - 2 Score 0 1 0 0 1 0 0  PHQ- 9 Score - 1 - - - - -    Fall Risk Fall Risk  04/24/2021 03/25/2021 08/12/2019 04/01/2018 03/30/2017  Falls in the past year? 0 1 1 No No  Number falls in past yr: 0 1 0 - -  Injury with Fall? 0 0 1 - -  Follow up Falls prevention discussed - - - -    FALL RISK PREVENTION PERTAINING TO THE HOME:  Any stairs in or around the home? No  Home free of loose throw rugs in walkways, pet beds, electrical cords, etc? Yes  Adequate lighting in your home to reduce risk of falls? Yes   ASSISTIVE DEVICES UTILIZED TO PREVENT FALLS:  Life alert? No  Use of a cane, walker or w/c? No  Grab bars in the bathroom? Yes  Shower chair or bench in shower? No  Elevated toilet seat or a handicapped toilet? No   TIMED UP AND GO:  Was the test performed? No . Phone visit   Cognitive Function:Normal cognitive status assessed by this Nurse Health Advisor. No abnormalities found.          Immunizations Immunization History  Administered Date(s) Administered   Influenza, High Dose Seasonal PF 08/03/2018, 07/11/2019, 07/11/2019   Influenza, Seasonal, Injecte, Preservative Fre 12/02/2012   Influenza,inj,Quad PF,6+ Mos 07/31/2014, 10/02/2015, 09/19/2016   Influenza-Unspecified 08/12/2017, 08/06/2020   PFIZER(Purple Top)SARS-COV-2 Vaccination 12/17/2019, 01/11/2020, 08/06/2020, 02/25/2021   Pneumococcal  Conjugate-13 10/02/2015   Pneumococcal Polysaccharide-23 05/14/2017   Zoster Recombinat (Shingrix) 08/08/2019   Zoster, Live 12/02/2012    TDAP status: Up to date  Flu Vaccine status: Up to date  Pneumococcal vaccine status: Up to date  Covid-19 vaccine status: Completed vaccines  Qualifies for Shingles Vaccine? No   Zostavax completed Yes   Shingrix Completed?: Yes  Screening Tests Health Maintenance  Topic Date Due   OPHTHALMOLOGY EXAM  Never done   Zoster Vaccines- Shingrix (2 of 2) 10/03/2019   MAMMOGRAM  05/25/2020   INFLUENZA VACCINE  06/10/2021   COVID-19 Vaccine (5 - Booster for Pfizer series) 06/27/2021   TETANUS/TDAP  06/14/2023   COLONOSCOPY (Pts 45-16yrs Insurance coverage will need to be confirmed)  03/20/2024   DEXA SCAN  Completed   Hepatitis C Screening  Completed   PNA vac Low Risk Adult  Completed   HPV VACCINES  Aged Out   FOOT EXAM  Discontinued   HEMOGLOBIN A1C  Discontinued   URINE MICROALBUMIN  Discontinued    Health Maintenance  Health Maintenance Due  Topic Date Due   OPHTHALMOLOGY EXAM  Never done   Zoster Vaccines- Shingrix (2 of 2) 10/03/2019   MAMMOGRAM  05/25/2020    Colorectal cancer screening: Type of screening: Colonoscopy. Completed 03/20/2021. Repeat every 3 years  Mammogram status: Scheduled for 06/13/2021  Bone Density status: Ordered today. Pt provided with contact info and advised to call to schedule appt.  Lung Cancer Screening: (Low Dose CT Chest recommended if Age 50-80 years, 30 pack-year currently smoking OR have quit w/in 15years.) does not qualify.    Additional Screening:  Hepatitis C Screening: Completed 09/19/2016  Vision Screening: Recommended annual ophthalmology exams for early detection of glaucoma and other disorders of the eye. Is the patient up to date with their annual eye exam?  Yes  Who is the provider or what is the name of the office in which the patient attends annual eye exams? [facility] Screening: Recommended annual dental exams for proper oral hygiene  Community Resource Referral / Chronic Care Management: CRR required this visit?  No   CCM required this visit?  No      Plan:     I have personally reviewed and noted the following in the patient's chart:   Medical and social history Use of alcohol, tobacco or illicit drugs  Current medications and supplements including opioid prescriptions.  Functional ability and status Nutritional status Physical activity Advanced directives List of other physicians Hospitalizations, surgeries, and ER visits in previous 12 months Vitals Screenings to include cognitive, depression, and falls Referrals and appointments  In addition, I have reviewed and discussed with patient certain preventive protocols, quality metrics, and best practice recommendations. A written personalized care plan for preventive services as well as general preventive health recommendations were provided to patient.   Due to this being a telephonic visit, the after visit summary with patients personalized plan was offered to patient via mail or my-chart. Patient would like to access on my-chart.   Marta Antu, LPN   12/18/221  Nurse Health Advisor  Nurse Notes: None

## 2021-05-24 DIAGNOSIS — M7502 Adhesive capsulitis of left shoulder: Secondary | ICD-10-CM | POA: Diagnosis not present

## 2021-05-24 DIAGNOSIS — Z96612 Presence of left artificial shoulder joint: Secondary | ICD-10-CM | POA: Diagnosis not present

## 2021-06-13 ENCOUNTER — Other Ambulatory Visit: Payer: Self-pay

## 2021-06-13 ENCOUNTER — Ambulatory Visit
Admission: RE | Admit: 2021-06-13 | Discharge: 2021-06-13 | Disposition: A | Discharge status: Home / Self Care | Payer: Medicare HMO | Source: Ambulatory Visit | Attending: Family Medicine | Admitting: Family Medicine

## 2021-06-13 DIAGNOSIS — Z1231 Encounter for screening mammogram for malignant neoplasm of breast: Secondary | ICD-10-CM | POA: Diagnosis not present

## 2021-06-16 ENCOUNTER — Encounter: Payer: Self-pay | Admitting: Family Medicine

## 2021-06-18 ENCOUNTER — Other Ambulatory Visit: Payer: Self-pay | Admitting: Family Medicine

## 2021-06-19 MED ORDER — CLONAZEPAM 1 MG PO TABS: 1.0000 mg | ORAL_TABLET | Freq: Every day | ORAL | 0 refills | Status: DC

## 2021-06-19 NOTE — Telephone Encounter (Signed)
Requesting: clonazepam '1mg'$  Contract: 03/25/2021 UDS: 03/25/2021 Last Visit: 03/25/2021 Next Visit: 09/26/2021 Last Refill: 03/13/2021 #90 and 0RF  Please Advise

## 2021-06-20 ENCOUNTER — Encounter: Payer: Self-pay | Admitting: Family Medicine

## 2021-06-21 DIAGNOSIS — Z20822 Contact with and (suspected) exposure to covid-19: Secondary | ICD-10-CM | POA: Diagnosis not present

## 2021-08-02 ENCOUNTER — Encounter: Payer: Self-pay | Admitting: Family Medicine

## 2021-08-02 ENCOUNTER — Other Ambulatory Visit: Payer: Self-pay | Admitting: Family Medicine

## 2021-08-02 DIAGNOSIS — M25569 Pain in unspecified knee: Secondary | ICD-10-CM

## 2021-08-20 DIAGNOSIS — M1712 Unilateral primary osteoarthritis, left knee: Secondary | ICD-10-CM | POA: Diagnosis not present

## 2021-08-23 ENCOUNTER — Encounter: Payer: Self-pay | Admitting: Family Medicine

## 2021-09-02 ENCOUNTER — Ambulatory Visit (INDEPENDENT_AMBULATORY_CARE_PROVIDER_SITE_OTHER): Payer: Medicare HMO | Admitting: Medical

## 2021-09-02 ENCOUNTER — Other Ambulatory Visit: Payer: Self-pay

## 2021-09-02 VITALS — BP 128/70 | HR 67 | Temp 98.0°F | Resp 18 | Ht 59.0 in | Wt 129.2 lb

## 2021-09-02 DIAGNOSIS — L089 Local infection of the skin and subcutaneous tissue, unspecified: Secondary | ICD-10-CM | POA: Diagnosis not present

## 2021-09-02 MED ORDER — CEFTRIAXONE SODIUM 1 G IJ SOLR
1.0000 g | Freq: Once | INTRAMUSCULAR | Status: AC
  Administered 2021-09-02: 1 g via INTRAMUSCULAR

## 2021-09-02 MED ORDER — AMOXICILLIN-POT CLAVULANATE 875-125 MG PO TABS: 1.0000 | ORAL_TABLET | Freq: Two times a day (BID) | ORAL | 0 refills | Status: DC

## 2021-09-02 NOTE — Progress Notes (Signed)
Subjective:    Patient ID: Stephanie Barnes, female    DOB: [birth year], 70 y.o.   MRN: [id number]  HPI  Pt has 2 days of swelling and redness. Initially was just painful then got red and swollen. Pt soaked the finger in peroxide.   No history of skin infections in the past.  No trauma reported.   Pt did get her nails done last Thursday.      Review of Systems  Constitutional:  Negative for chills, fatigue and fever.  Respiratory:  Negative for cough and chest tightness.   Cardiovascular:  Negative for chest pain and palpitations.  Gastrointestinal:  Negative for abdominal pain.  Musculoskeletal:  Negative for back pain.       Rt 4th digit infection. See hpi and exam.  Neurological:  Negative for dizziness, seizures, weakness and light-headedness.  Hematological:  Negative for adenopathy. Does not bruise/bleed easily.  Psychiatric/Behavioral:  Negative for behavioral problems and decreased concentration.    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   Welcome to Medicare preventive visit 10/02/2015     Social History   Socioeconomic History   Marital status: Single    Spouse name: Not on file   Number of children: Not on file   Years of education: Not on file   Highest education level: Not on file  Occupational History   Not on file  Tobacco Use   Smoking status: Never   Smokeless tobacco: Never  Vaping Use   Vaping Use: Never used  Substance and Sexual Activity   Alcohol use: Yes    Alcohol/week: 1.0 standard drink     Types: 1 Glasses of wine per week    Comment: per week   Drug use: No   Sexual activity: Not Currently    Comment: lives alone, retired from [employer], no dietary restrictions  Other Topics Concern   Not on file  Social History Narrative   Not on file   Social Determinants of Health   Financial Resource Strain: Low Risk    Difficulty of Paying Living Expenses: Not hard at all  Food Insecurity: No Food Insecurity   Worried About Charity fundraiser in the Last Year: Never true   Arboriculturist in the Last Year: Never true  Transportation Needs: No Transportation Needs   Lack of Transportation (Medical): No   Lack of Transportation (Non-Medical): No  Physical Activity: Insufficiently Active   Days of Exercise per Week: 1 day   Minutes of Exercise per Session: 60 min  Stress: No Stress Concern Present   Feeling of Stress : Not at all  Social Connections: Socially Isolated   Frequency of Communication with Friends and Family: More than three times a week   Frequency of Social Gatherings with Friends and Family: Once a week   Attends Religious Services: Never   Marine scientist or Organizations: No   Attends Archivist Meetings: Never  Marital Status: Never married  Human resources officer Violence: Not At Risk   Fear of Current or Ex-Partner: No   Emotionally Abused: No   Physically Abused: No   Sexually Abused: No    Past Surgical History:  Procedure Laterality Date   COLONOSCOPY  03/31/2014   Dr.Mann-polyp   DENTAL SURGERY     Root cleaning    LASIK     MOUTH SURGERY  08/10/2017   front lower   OPEN REDUCTION INTERNAL FIXATION (ORIF) DISTAL RADIAL FRACTURE Left 03/15/2019   Procedure: OPEN REDUCTION INTERNAL FIXATION (ORIF) DISTAL RADIAL FRACTURE;  Surgeon: Roseanne Kaufman, MD;  Location: [facility];  Service: Orthopedics;  Laterality: Left;   REFRACTIVE SURGERY Bilateral 2000   REVERSE SHOULDER ARTHROPLASTY Left 03/15/2019   Procedure: REVERSE SHOULDER  ARTHROPLASTY;  Surgeon: Nicholes Stairs, MD;  Location: [facility];  Service: Orthopedics;  Laterality: Left;  264min   ROTATOR CUFF REPAIR Right 2017   TONSILLECTOMY      Family History  Problem Relation Age of Onset   Heart disease Mother    Arthritis Mother    Heart disease Father    COPD Father    Colon polyps Father    Mental illness Brother        bipolar   Hypertension Brother    Suicidality Brother    Alcohol abuse Brother    Heart disease Maternal Aunt    Heart disease Maternal Uncle    Heart disease Paternal Aunt    COPD Paternal Aunt    Heart disease Paternal Uncle    Heart disease Maternal Grandmother    Heart disease Maternal Grandfather    Heart disease Paternal Grandmother    Heart disease Paternal Grandfather    Colon cancer Neg Hx    Esophageal cancer Neg Hx    Stomach cancer Neg Hx    Rectal cancer Neg Hx     Allergies  Allergen Reactions   Aspirin Anaphylaxis and Swelling    Swelling of lips and fingers    Celebrex [Celecoxib] Anaphylaxis, Hives and Shortness Of Breath   Doxycycline Other (See Comments)    Scarred throat    Prednisone Shortness Of Breath and Other (See Comments)    Chest tightness    Fosamax [Alendronate Sodium] Hives   Other Hives and Nausea Only    Arthritec   Amitriptyline     UNSPECIFIED REACTION    Chlorhexidine     UNSPECIFIED REACTION    Celexa [Citalopram] Other (See Comments)    Shakiness    Clindamycin/Lincomycin Nausea And Vomiting   Tape Itching and Other (See Comments)    UNSPECIFIED REACTION  Paper tape is okay to use per patient    Current Outpatient Medications on File Prior to Visit  Medication Sig Dispense Refill   amoxicillin (AMOXIL) 500 MG capsule Take 4 capsules by mouth. 1 hour prior to dental procedure      Ascorbic Acid (VITAMIN C) 1000 MG tablet Take 1,000 mg by mouth daily.     buPROPion (WELLBUTRIN) 75 MG tablet Take 0.5 tablets (37.5 mg total) by mouth daily. 45 tablet 2   Calcium-Magnesium  500-250 MG TABS Take 1 tablet by mouth in the morning and at bedtime.     cetirizine (ZYRTEC) 10 MG tablet Take 10 mg by mouth daily as needed for allergies.      clonazePAM (KLONOPIN) 1 MG tablet Take 1 tablet (1 mg total) by mouth at bedtime. 90 tablet 0   Cyanocobalamin (VITAMIN B 12  PO) Take 1,000 mg by mouth daily.      gabapentin (NEURONTIN) 300 MG capsule Take 1 capsule (300 mg total) by mouth 3 (three) times daily for 21 days. 63 capsule 0   HYDROmorphone (DILAUDID) 2 MG tablet Take 1-2 tablets (2-4 mg total) by mouth every 4 (four) hours as needed for moderate pain or severe pain (2 mg for moderate pain and 4 mg for severe pain.). 50 tablet 0   Krill Oil 1000 MG CAPS Take 1 capsule by mouth daily.     Melatonin-Pyridoxine (MELATONEX) 3-10 MG TBCR Take 1 tablet by mouth at bedtime.     mometasone (ELOCON) 0.1 % ointment Apply topically daily. As needed 15 g 2   OVER THE COUNTER MEDICATION Apply 1 application topically as needed (pain). Hemp lotion     Probiotic Product (PROBIOTIC DAILY PO) Take 1 tablet by mouth daily. NOW 10     TURMERIC PO Take 894 mg by mouth 2 (two) times daily. GAIA brand-Take 1 in the morning and 1 at night.     UNABLE TO FIND Take 1 tablet by mouth daily. Med Name: Zinc 15mg  with Selenium 200mg      No current facility-administered medications on file prior to visit.    BP (!) 133/100   Pulse 67   Temp 98 F (36.7 C)   Resp 18   Ht 4\' 11"  (1.499 m)   Wt 129 lb 3.2 oz (58.6 kg)   SpO2 99%   BMI 26.10 kg/m         Objective:   Physical Exam  General- No acute distress. Pleasant patient. Neck- Full range of motion, no jvd Lungs- Clear, even and unlabored. Heart- regular rate and rhythm. Neurologic- CNII- XII grossly intact.  Rt 4th digit- from dip area and downward pinkish red. The lateral edge of nail mild white/yellow(more on white side) appearance at edge of nail but no fluctuance no redness or discharge. Pad does appear swollen.         Assessment & Plan:   Patient Instructions  Rt finger infection with appearance of paronychia and felon. We gave rocephin 1 gram IM and augmentin oral antibiotic.  Start warm salt water soaks twice daily.  Concern for potential forming abscess. Went ahead and placed hand specialist referral for possible I and D. If pending referral rapid expansion past where is currently then ED evaluation.   Follow up wed or sooner if needed here. But goal is to get you in with hand surgeon by then.  Mackie Pai, PA-C

## 2021-09-02 NOTE — Addendum Note (Signed)
Addended by: Jeronimo Greaves on: 09/02/2021 10:22 AM   Modules accepted: Orders

## 2021-09-02 NOTE — Patient Instructions (Addendum)
Rt finger infection with appearance of paronychia and felon. We gave rocephin 1 gram IM and augmentin oral antibiotic.  Start warm salt water soaks twice daily.  Concern for potential forming abscess. Went ahead and placed hand specialist referral for possible I and D. If pending referral rapid expansion past where is currently then ED evaluation.   Follow up wed or sooner if needed here. But goal is to get you in with hand surgeon by then.

## 2021-09-03 ENCOUNTER — Ambulatory Visit: Payer: Medicare HMO | Admitting: Orthopaedic Surgery

## 2021-09-03 DIAGNOSIS — L03011 Cellulitis of right finger: Secondary | ICD-10-CM | POA: Diagnosis not present

## 2021-09-03 NOTE — Progress Notes (Signed)
Office Visit Note   Patient: Stephanie Barnes           Date of Birth: 11/11/1950           MRN: [id number] Visit Date: 09/03/2021              Requested by: Mackie Pai, PA-C [place],  [place] [id number] PCP: Mosie Lukes, MD   Assessment & Plan: Visit Diagnoses:  1. Paronychia of finger of right hand     Plan: Impression is right ring finger paronychia.  At this point, there are no signs of felon or flexor tenosynovitis.  She is currently on Augmentin which is appropriate.  She will follow-up with [place] in 1 week's time for recheck.  If her symptoms worsen in the meantime, she will follow-up sooner.    Follow-Up Instructions: Return in about 1 week (around 09/10/2021).   Orders:  No orders of the defined types were placed in this encounter.  No orders of the defined types were placed in this encounter.     Procedures: No procedures performed   Clinical Data: No additional findings.   Subjective: Chief Complaint  Patient presents with   Other    Right ring finger possible infection    HPI patient is a pleasant 70 year old right-hand-dominant female who comes in today with concerns about her right ring finger.  This past Sunday, she woke up with it swollen and painful.  Her symptoms have not worsened since yesterday.  She was seen by her PCP yesterday where she was started on Augmentin.  She denies any fevers or chills.  No drainage.  She denies any specific injury but notes that she did some gardening last week as well as had her nails repainted.  Review of Systems as detailed in HPI.  All others reviewed and are negative.   Objective: Vital Signs: There were no vitals taken for this visit.  Physical Exam well-developed well-nourished female in no acute distress.  Alert and oriented x3.  Ortho Exam right ring finger reveals mild swelling, erythema and tenderness consistent with paronychia.  No drainage.  No signs of a felon or  flexor Tina synovitis as she is able to actively flex and extend her MCP joint without pain.  She is neurovascular intact distally.  Normal capillary refill.  Specialty Comments:  No specialty comments available.  Imaging: No new imaging   PMFS History: Patient Active Problem List   Diagnosis Date Noted   Diverticulosis 03/25/2021   Sinusitis 12/17/2020   Weight loss 08/08/2019   Closed left arm fracture 06/08/2019   Closed fracture of left proximal humerus 03/15/2019   Vitamin D deficiency 12/07/2018   Cervical cancer screening 12/07/2018   Dense breast tissue on mammogram 04/13/2018   Chronic shoulder pain (Location of Primary Source of Pain) (Right) 09/04/2016   Chronic upper extremity pain (Right) 09/04/2016   Neuropathic pain of shoulder (Right) 09/04/2016   Spondylosis of cervical spine (C5-6 and C6-7) 09/04/2016   Cervical DDD (degenerative disc disease) (C5-6 and C6-7) 09/04/2016   Osteoarthritis of right AC (acromioclavicular) joint 09/04/2016   Abnormal MRI, cervical spine (07/23/2015) 09/04/2016   Abnormal MRI, shoulder (08/06/2015) 09/04/2016   Subdeltoid bursitis of right shoulder joint 09/04/2016   Subacromial bursitis of right shoulder joint 09/04/2016   Labral tear of shoulder, right, sequela 09/04/2016   Synovitis involving glenohumeral joint, right 09/04/2016   Biceps tendinopathy, right 09/04/2016   Infraspinatus tendon tear, right, sequela  09/04/2016   Supraspinatus tendon tear, right, sequela 09/04/2016   Allergies 03/31/2016   Anemia 01/13/2016   CRPS (complex regional pain syndrome), upper limb 01/01/2016   Preventative health care 10/02/2015   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   Cervical disc disorder with radiculopathy of cervical region 07/17/2015   Colon polyp   tubular adenoma  03/2014 03/22/2014   Hyperlipidemia 06/13/2013   Osteopenia 06/13/2013   Insomnia 06/09/2013   Depression with anxiety 12/02/2012   DJD (degenerative joint disease) 12/02/2012    GERD (gastroesophageal reflux disease) 12/02/2012   Eczema 12/02/2012   Tendonitis of right hand 12/02/2012   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   Welcome to Medicare preventive visit 10/02/2015    Family History  Problem Relation Age of Onset   Heart disease Mother    Arthritis Mother    Heart disease Father    COPD Father    Colon polyps Father    Mental illness Brother        bipolar   Hypertension Brother    Suicidality Brother    Alcohol abuse Brother    Heart disease Maternal Aunt    Heart disease Maternal Uncle    Heart disease Paternal Aunt    COPD Paternal Aunt    Heart disease Paternal Uncle    Heart disease Maternal Grandmother    Heart disease Maternal Grandfather    Heart disease Paternal Grandmother    Heart disease Paternal Grandfather    Colon cancer Neg Hx    Esophageal cancer Neg Hx    Stomach cancer Neg Hx    Rectal cancer Neg Hx     Past Surgical History:  Procedure Laterality Date   COLONOSCOPY  03/31/2014   Dr.Mann-polyp   DENTAL SURGERY     Root cleaning    LASIK     MOUTH SURGERY  08/10/2017   front lower   OPEN REDUCTION INTERNAL FIXATION (ORIF) DISTAL RADIAL FRACTURE Left 03/15/2019   Procedure: OPEN REDUCTION INTERNAL FIXATION (ORIF) DISTAL RADIAL FRACTURE;  Surgeon: Roseanne Kaufman, MD;  Location: [facility];  Service: Orthopedics;  Laterality: Left;   REFRACTIVE SURGERY Bilateral 2000   REVERSE SHOULDER ARTHROPLASTY Left 03/15/2019   Procedure: REVERSE SHOULDER ARTHROPLASTY;  Surgeon: Nicholes Stairs, MD;  Location: [facility];  Service: Orthopedics;  Laterality: Left;  221min   ROTATOR CUFF REPAIR Right 2017   TONSILLECTOMY     Social History   Occupational History   Not on file  Tobacco Use   Smoking status: Never   Smokeless tobacco: Never  Vaping Use   Vaping Use: Never used  Substance and Sexual Activity   Alcohol use: Yes    Alcohol/week: 1.0 standard drink    Types: 1 Glasses of wine per week    Comment: per week   Drug use: No   Sexual activity: Not Currently    Comment: lives alone, retired from [employer], no dietary restrictions

## 2021-09-10 ENCOUNTER — Encounter: Payer: Self-pay | Admitting: Orthopaedic Surgery

## 2021-09-10 ENCOUNTER — Ambulatory Visit: Payer: Medicare HMO | Admitting: Orthopaedic Surgery

## 2021-09-10 ENCOUNTER — Other Ambulatory Visit: Payer: Self-pay

## 2021-09-10 DIAGNOSIS — L03011 Cellulitis of right finger: Secondary | ICD-10-CM

## 2021-09-10 NOTE — Progress Notes (Signed)
Patient: Stephanie Barnes           Date of Birth: 03-12-1951           MRN: [id number] Visit Date: 09/10/2021 PCP: Stephanie Lukes, MD   Assessment & Plan:  Chief Complaint:  Chief Complaint  Patient presents with   Right Ring Finger - Follow-up   Visit Diagnoses:  1. Paronychia of finger of right hand     Plan: [name] comes back today for recheck of her right ring finger paronychia.  Overall doing much better.  She has been compliant with Augmentin has 2 more days left.  The paronychia looks significantly better.  There is no more pus and the swelling has gone down considerably.  She has been instructed to finish out the Augmentin and to avoid manicures for another month or so or until the finger returns back to normal appearance.  We will see her back as needed.  Follow-Up Instructions: Return if symptoms worsen or fail to improve.   Orders:  No orders of the defined types were placed in this encounter.  No orders of the defined types were placed in this encounter.   Imaging: No results found.  PMFS History: Patient Active Problem List   Diagnosis Date Noted   Diverticulosis 03/25/2021   Sinusitis 12/17/2020   Weight loss 08/08/2019   Closed left arm fracture 06/08/2019   Closed fracture of left proximal humerus 03/15/2019   Vitamin D deficiency 12/07/2018   Cervical cancer screening 12/07/2018   Dense breast tissue on mammogram 04/13/2018   Chronic shoulder pain (Location of Primary Source of Pain) (Right) 09/04/2016   Chronic upper extremity pain (Right) 09/04/2016   Neuropathic pain of shoulder (Right) 09/04/2016   Spondylosis of cervical spine (C5-6 and C6-7) 09/04/2016   Cervical DDD (degenerative disc disease) (C5-6 and C6-7) 09/04/2016   Osteoarthritis of right AC (acromioclavicular) joint 09/04/2016   Abnormal MRI, cervical spine (07/23/2015) 09/04/2016   Abnormal MRI, shoulder (08/06/2015) 09/04/2016   Subdeltoid bursitis of right shoulder joint  09/04/2016   Subacromial bursitis of right shoulder joint 09/04/2016   Labral tear of shoulder, right, sequela 09/04/2016   Synovitis involving glenohumeral joint, right 09/04/2016   Biceps tendinopathy, right 09/04/2016   Infraspinatus tendon tear, right, sequela 09/04/2016   Supraspinatus tendon tear, right, sequela 09/04/2016   Allergies 03/31/2016   Anemia 01/13/2016   CRPS (complex regional pain syndrome), upper limb 01/01/2016   Preventative health care 10/02/2015   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   Cervical disc disorder with radiculopathy of cervical region 07/17/2015   Colon polyp   tubular adenoma  03/2014 03/22/2014   Hyperlipidemia 06/13/2013   Osteopenia 06/13/2013   Insomnia 06/09/2013   Depression with anxiety 12/02/2012   DJD (degenerative joint disease) 12/02/2012   GERD (gastroesophageal reflux disease) 12/02/2012   Eczema 12/02/2012   Tendonitis of right hand 12/02/2012   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   Welcome to Medicare preventive visit  10/02/2015    Family History  Problem Relation Age of Onset   Heart disease Mother    Arthritis Mother    Heart disease Father    COPD Father    Colon polyps Father    Mental illness Brother        bipolar   Hypertension Brother    Suicidality Brother    Alcohol abuse Brother    Heart disease Maternal Aunt    Heart disease Maternal Uncle    Heart disease Paternal Aunt    COPD Paternal Aunt    Heart disease Paternal Uncle    Heart disease Maternal Grandmother    Heart  disease Maternal Grandfather    Heart disease Paternal Grandmother    Heart disease Paternal Grandfather    Colon cancer Neg Hx    Esophageal cancer Neg Hx    Stomach cancer Neg Hx    Rectal cancer Neg Hx     Past Surgical History:  Procedure Laterality Date   COLONOSCOPY  03/31/2014   Dr.Mann-polyp   DENTAL SURGERY     Root cleaning    LASIK     MOUTH SURGERY  08/10/2017   front lower   OPEN REDUCTION INTERNAL FIXATION (ORIF) DISTAL RADIAL FRACTURE Left 03/15/2019   Procedure: OPEN REDUCTION INTERNAL FIXATION (ORIF) DISTAL RADIAL FRACTURE;  Surgeon: Roseanne Kaufman, MD;  Location: [facility];  Service: Orthopedics;  Laterality: Left;   REFRACTIVE SURGERY Bilateral 2000   REVERSE SHOULDER ARTHROPLASTY Left 03/15/2019   Procedure: REVERSE SHOULDER ARTHROPLASTY;  Surgeon: Nicholes Stairs, MD;  Location: [facility];  Service: Orthopedics;  Laterality: Left;  270min   ROTATOR CUFF REPAIR Right 2017   TONSILLECTOMY     Social History   Occupational History   Not on file  Tobacco Use   Smoking status: Never   Smokeless tobacco: Never  Vaping Use   Vaping Use: Never used  Substance and Sexual Activity   Alcohol use: Yes    Alcohol/week: 1.0 standard drink    Types: 1 Glasses of wine per week    Comment: per week   Drug use: No   Sexual activity: Not Currently    Comment: lives alone, retired from [employer], no dietary restrictions

## 2021-09-11 ENCOUNTER — Encounter: Payer: Self-pay | Admitting: Family Medicine

## 2021-09-11 ENCOUNTER — Other Ambulatory Visit: Payer: Self-pay | Admitting: Family Medicine

## 2021-09-11 MED ORDER — BUPROPION HCL 75 MG PO TABS: 75.0000 mg | ORAL_TABLET | Freq: Every day | ORAL | 1 refills | Status: DC

## 2021-09-12 ENCOUNTER — Ambulatory Visit: Payer: Medicare HMO | Admitting: Family Medicine

## 2021-09-19 ENCOUNTER — Other Ambulatory Visit: Payer: Self-pay | Admitting: Family Medicine

## 2021-09-19 NOTE — Telephone Encounter (Signed)
Requesting: clonazepam 1mg   Contract: 03/25/2021 UDS: 03/25/2021 Last Visit: 09/02/2021 Next Visit: 09/26/2021 Last Refill: 06/19/2021 #90 and 0RF  Please Advise

## 2021-09-20 ENCOUNTER — Telehealth: Payer: Self-pay

## 2021-09-20 ENCOUNTER — Other Ambulatory Visit: Payer: Self-pay | Admitting: Family Medicine

## 2021-09-20 MED ORDER — CLONAZEPAM 1 MG PO TABS: 1.0000 mg | ORAL_TABLET | Freq: Every day | ORAL | 1 refills | Status: DC

## 2021-09-20 NOTE — Telephone Encounter (Signed)
Patient Name: Stephanie Barnes Gender: Female DOB: 11-Nov-1950 Client [facility] Night - Client Client Site [facility] - Night Physician Penni Homans - MD Contact Type Call Who Is Calling Patient / Member / Family / Caregiver Call Type Triage / Clinical Relationship To Patient Self Return Phone Number [phone] (Primary) Chief Complaint Prescription Refill or Medication Request (non symptomatic) Reason for Call Medication Question / Request Initial Comment Caller states she thought her appt was this appt but it is next Thursday and she will be out of medication. Medication is Clonazepam 2 mg and she takes one at night to help her sleep. She also takes Wellbutrin one in the morning at the lowest dose but caller is not sure which dose specifically. Pharmacy is [facility] [phone]. Caller denies any current symptoms. Translation No Disp. Time Eilene Ghazi Time) Disposition Final User 09/19/2021 2:39:43 PM Send To Nurse Ria Comment, RN, April 09/19/2021 4:06:30 PM FINAL ATTEMPT MADE - message left Evalee Mutton 09/19/2021 4:06:42 PM Send to RN Final Attempt Colin Rhein, RN, Tonya 09/19/2021 11:09:31 PM Attempt made - message left Redmond Pulling, RN, Levada Dy 09/19/2021 11:22:34 PM FINAL ATTEMPT MADE - message left Yes Redmond Pulling, RN, Glenard Haring

## 2021-09-20 NOTE — Telephone Encounter (Signed)
Medication sent yesterday 

## 2021-09-25 ENCOUNTER — Other Ambulatory Visit: Payer: Self-pay

## 2021-09-26 ENCOUNTER — Ambulatory Visit (INDEPENDENT_AMBULATORY_CARE_PROVIDER_SITE_OTHER): Payer: Medicare HMO | Admitting: Family Medicine

## 2021-09-26 DIAGNOSIS — M8580 Other specified disorders of bone density and structure, unspecified site: Secondary | ICD-10-CM | POA: Diagnosis not present

## 2021-09-26 DIAGNOSIS — E119 Type 2 diabetes mellitus without complications: Secondary | ICD-10-CM | POA: Diagnosis not present

## 2021-09-26 DIAGNOSIS — E559 Vitamin D deficiency, unspecified: Secondary | ICD-10-CM

## 2021-09-26 DIAGNOSIS — K219 Gastro-esophageal reflux disease without esophagitis: Secondary | ICD-10-CM | POA: Diagnosis not present

## 2021-09-26 DIAGNOSIS — F418 Other specified anxiety disorders: Secondary | ICD-10-CM

## 2021-09-26 DIAGNOSIS — E782 Mixed hyperlipidemia: Secondary | ICD-10-CM

## 2021-09-26 DIAGNOSIS — Z79899 Other long term (current) drug therapy: Secondary | ICD-10-CM

## 2021-09-26 DIAGNOSIS — G90513 Complex regional pain syndrome I of upper limb, bilateral: Secondary | ICD-10-CM

## 2021-09-26 DIAGNOSIS — Z780 Asymptomatic menopausal state: Secondary | ICD-10-CM | POA: Diagnosis not present

## 2021-09-26 DIAGNOSIS — Z0000 Encounter for general adult medical examination without abnormal findings: Secondary | ICD-10-CM

## 2021-09-26 NOTE — Assessment & Plan Note (Signed)
Encouraged to get adequate exercise, calcium and vitamin d intake. Repeat Dexa scan ordered today 

## 2021-09-26 NOTE — Assessment & Plan Note (Addendum)
Generally is doing well did increase her Wellbutrin from 37.5 to 75 mg daily for past week, no side effects but no improvement yet. Can consider increasing to 75 in am and 37.5 in pm

## 2021-09-26 NOTE — Assessment & Plan Note (Signed)
She is a baseline and while she still has pain and weakness she is baking and golfing as much as she wants most of the time

## 2021-09-26 NOTE — Progress Notes (Signed)
Patient ID: Stephanie Barnes, female    DOB: August 12, 1951  Age: 70 y.o. MRN: [id number]    Subjective:   Chief Complaint  Patient presents with   Annual Exam   Subjective  HPI Stephanie Barnes presents for office visit today for comprehensive physical exam today and follow up on management of chronic concerns. She is doing well and has no febrile illnesses or recent ER visits to report. Denies CP/palp/SOB/HA/congestion/fevers/GI or GU c/o. Taking meds as prescribed.  Anxiety: She had a rough day at work today and has been dealing with stressful events at home. She reports that it took her a 1.5 years to find most suitable med for her anxiety. Due to recent life stresses she expresses interest in increasing her Wellbutrin dosage.  Immunization: All shots are up to date  She bakes and golfs during her free time to keep herself busy.   Review of Systems  Constitutional:  Negative for chills, fatigue and fever.  HENT:  Negative for congestion, rhinorrhea, sinus pressure, sinus pain, sore throat and trouble swallowing.   Eyes:  Negative for pain.  Respiratory:  Negative for cough and shortness of breath.   Cardiovascular:  Negative for chest pain, palpitations and leg swelling.  Gastrointestinal:  Negative for abdominal pain, blood in stool, diarrhea, nausea and vomiting.  Genitourinary:  Negative for decreased urine volume, flank pain, frequency, vaginal bleeding and vaginal discharge.  Musculoskeletal:  Negative for back pain.  Neurological:  Negative for headaches.   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   Her family history includes Alcohol abuse in her brother; Arthritis in her mother; COPD in her father and paternal aunt; Colon polyps in her father; Heart disease in her father, maternal aunt, maternal grandfather, maternal grandmother, maternal uncle, mother, paternal aunt, paternal grandfather, paternal grandmother, and paternal uncle; Hypertension in her brother; Mental illness in her brother; Suicidality in her brother.She reports that she has never smoked. She has never used smokeless tobacco. She reports current alcohol use of about 1.0 standard drink per week. She reports that she does not use drugs.  Current Outpatient Medications on File Prior to Visit  Medication Sig Dispense Refill   amoxicillin (AMOXIL) 500 MG capsule Take 4 capsules by mouth. 1 hour prior to dental procedure      Ascorbic Acid (VITAMIN C) 1000 MG tablet Take 1,000 mg by mouth daily.     buPROPion (WELLBUTRIN) 75 MG tablet Take 1 tablet (75 mg total) by mouth daily. 90 tablet 1   Calcium-Magnesium 500-250 MG TABS Take 1 tablet by mouth in the morning and at bedtime.     cetirizine (ZYRTEC) 10 MG  tablet Take 10 mg by mouth daily as needed for allergies.      clonazePAM (KLONOPIN) 1 MG tablet Take 1 tablet (1 mg total) by mouth at bedtime. 90 tablet 1   Cyanocobalamin (VITAMIN B 12 PO) Take 1,000 mg by mouth daily.      Krill Oil 1000 MG CAPS Take 1 capsule by mouth daily.     Melatonin-Pyridoxine (MELATONEX) 3-10 MG TBCR Take 1 tablet by mouth at bedtime.      mometasone (ELOCON) 0.1 % ointment Apply topically daily. As needed 15 g 2   OVER THE COUNTER MEDICATION Apply 1 application topically as needed (pain). Hemp lotion     Probiotic Product (PROBIOTIC DAILY PO) Take 1 tablet by mouth daily. NOW 10     TURMERIC PO Take 894 mg by mouth 2 (two) times daily. GAIA brand-Take 1 in the morning and 1 at night.     UNABLE TO FIND Take 1 tablet by mouth daily. Med Name: Zinc 15mg  with Selenium 200mg      gabapentin (NEURONTIN) 300 MG capsule Take 1 capsule (300 mg total) by mouth 3 (three) times daily for 21 days. 63 capsule 0   No current facility-administered medications on file prior to visit.     Objective:  Objective  Physical Exam Constitutional:      General: She is not in acute distress.    Appearance: Normal appearance. She is not ill-appearing or toxic-appearing.  HENT:     Head: Normocephalic and atraumatic.     Right Ear: Tympanic membrane, ear canal and external ear normal.     Left Ear: Tympanic membrane, ear canal and external ear normal.     Nose: No congestion or rhinorrhea.  Eyes:     Extraocular Movements: Extraocular movements intact.     Right eye: No nystagmus.     Left eye: No nystagmus.     Pupils: Pupils are equal, round, and reactive to light.  Cardiovascular:     Rate and Rhythm: Normal rate and regular rhythm.     Pulses: Normal pulses.          Posterior tibial pulses are 2+ on the right side and 2+ on the left side.     Heart sounds: Normal heart sounds. No murmur heard. Pulmonary:     Effort: Pulmonary effort is normal. No respiratory distress.     Breath sounds: Normal breath sounds. No wheezing, rhonchi or rales.  Abdominal:     General: Bowel sounds are normal.     Palpations: Abdomen is soft. There is no mass.     Tenderness: There is no abdominal tenderness. There is no guarding.     Hernia: No hernia is present.  Musculoskeletal:        General: Normal range of motion.     Cervical back: Normal  range of motion and neck supple.  Skin:    General: Skin is warm and dry.  Neurological:     Mental Status: She is alert and oriented to person, place, and time.     Cranial Nerves: No facial asymmetry.     Motor: Motor function is intact. No weakness.     Deep Tendon Reflexes:     Reflex Scores:      Patellar reflexes are 2+ on the right side and 2+ on the left side. Psychiatric:        Behavior: Behavior normal.   There were no vitals taken for this visit. Wt Readings from Last 3 Encounters:  09/02/21 129 lb 3.2 oz (58.6 kg)  04/24/21 126 lb (57.2 kg)  03/20/21 126 lb (57.2 kg)     Lab Results  Component Value Date   WBC 4.6 09/20/2020   HGB 12.7 09/20/2020   HCT 38.1 09/20/2020   PLT 196.0 09/20/2020   GLUCOSE 79 10/25/2020   CHOL 182 09/20/2020   TRIG 120.0 09/20/2020   HDL 73.10 09/20/2020   LDLCALC 85 09/20/2020   ALT 13 10/25/2020   AST 16 10/25/2020   NA 140 10/25/2020   K 4.7 10/25/2020   CL 103 10/25/2020   CREATININE 1.01 10/25/2020   BUN 22 10/25/2020   CO2 31 10/25/2020   TSH 2.18 09/20/2020    MM 3D SCREEN BREAST BILATERAL  Result Date: 06/16/2021 CLINICAL DATA:  Screening. EXAM: DIGITAL SCREENING BILATERAL MAMMOGRAM WITH TOMOSYNTHESIS AND CAD TECHNIQUE: Bilateral screening digital craniocaudal and mediolateral oblique mammograms were obtained. Bilateral screening digital breast tomosynthesis was performed. The images were evaluated with computer-aided detection. COMPARISON:  Previous exam(s). ACR Breast Density Category c: The breast tissue is heterogeneously dense, which may obscure small masses. FINDINGS: There are no findings suspicious for malignancy. IMPRESSION: No mammographic evidence of malignancy. A result letter of this screening mammogram will be mailed directly to the patient. RECOMMENDATION: Screening mammogram in one year. (Code:SM-B-01Y) BI-RADS CATEGORY  1: Negative. Electronically Signed   By: Ammie Ferrier M.D.   On: 06/16/2021 09:31      Assessment & Plan:  Plan    No orders of the defined types were placed in this encounter.   Problem List Items Addressed This Visit     Depression with anxiety    Generally is doing well did increase her Wellbutrin from 37.5 to 75 mg daily for past week, no side effects but no improvement yet. Can consider increasing to 75 in am and 37.5 in pm       GERD (gastroesophageal reflux disease)    Avoid offending foods, start probiotics. Do not eat large meals in late evening and consider raising head of bed.       Hyperlipidemia    Encourage heart healthy diet such as MIND or DASH diet, increase exercise, avoid trans fats, simple carbohydrates and processed foods, consider a krill or fish or flaxseed oil cap daily.       Relevant Orders   CBC with Differential/Platelet   Comprehensive metabolic panel   Lipid panel   TSH   Osteopenia    Encouraged to get adequate exercise, calcium and vitamin d intake. Repeat Dexa scan ordered today      Preventative health care    Patient encouraged to maintain heart healthy diet, regular exercise, adequate sleep. Consider daily probiotics. Take medications as prescribed. Labs ordered and reviewed. Immunizations up to date. Eye exam on 01/02/21. Dexa scan ordered. MGM and pap and colonoscopy all up to date.       CRPS (complex regional pain syndrome), upper limb    She is a baseline and while she still has pain and weakness she is baking and golfing as much as she wants most of the time      Vitamin D deficiency    Supplement and monitor      Relevant Orders   VITAMIN D 25 Hydroxy (Vit-D Deficiency, Fractures)   Other Visit Diagnoses     Postmenopausal estrogen deficiency    -  Primary   Relevant Orders   DG Bone Density   VITAMIN D 25 Hydroxy (Vit-D Deficiency, Fractures)  Diabetes mellitus without complication ([place])       Relevant Orders   Hemoglobin A1c   High risk medication use       Relevant Orders   Drug Monitoring  Panel [phone] , Urine   Long-term current use of benzodiazepine       Relevant Orders   Drug Monitoring Panel [phone] , Urine       Follow-up: Return in about 6 months (around 03/26/2022) for f/u visit.  I, Suezanne Jacquet, acting as a scribe for Penni Homans, MD, have documented all relevent documentation on behalf of Penni Homans, MD, as directed by Penni Homans, MD while in the presence of Penni Homans, MD. DO:09/26/21.  I, Mosie Lukes, MD personally performed the services described in this documentation. All medical record entries made by the scribe were at my direction and in my presence. I have reviewed the chart and agree that the record reflects my personal performance and is accurate and complete

## 2021-09-26 NOTE — Assessment & Plan Note (Signed)
Supplement and monitor 

## 2021-09-26 NOTE — Patient Instructions (Signed)
Preventive Care 40 Years and Older, Female Preventive care refers to lifestyle choices and visits with your health care provider that can promote health and wellness. Preventive care visits are also called wellness exams. What can I expect for my preventive care visit? Counseling Your health care provider may ask you questions about your: Medical history, including: Past medical problems. Family medical history. Pregnancy and menstrual history. History of falls. Current health, including: Memory and ability to understand (cognition). Emotional well-being. Home life and relationship well-being. Sexual activity and sexual health. Lifestyle, including: Alcohol, nicotine or tobacco, and drug use. Access to firearms. Diet, exercise, and sleep habits. Work and work Statistician. Sunscreen use. Safety issues such as seatbelt and bike helmet use. Physical exam Your health care provider will check your: Height and weight. These may be used to calculate your BMI (body mass index). BMI is a measurement that tells if you are at a healthy weight. Waist circumference. This measures the distance around your waistline. This measurement also tells if you are at a healthy weight and may help predict your risk of certain diseases, such as type 2 diabetes and high blood pressure. Heart rate and blood pressure. Body temperature. Skin for abnormal spots. What immunizations do I need? Vaccines are usually given at various ages, according to a schedule. Your health care provider will recommend vaccines for you based on your age, medical history, and lifestyle or other factors, such as travel or where you work. What tests do I need? Screening Your health care provider may recommend screening tests for certain conditions. This may include: Lipid and cholesterol levels. Hepatitis C test. Hepatitis B test. HIV (human immunodeficiency virus) test. STI (sexually transmitted infection) testing, if you are at  risk. Lung cancer screening. Colorectal cancer screening. Diabetes screening. This is done by checking your blood sugar (glucose) after you have not eaten for a while (fasting). Mammogram. Talk with your health care provider about how often you should have regular mammograms. BRCA-related cancer screening. This may be done if you have a family history of breast, ovarian, tubal, or peritoneal cancers. Bone density scan. This is done to screen for osteoporosis. Talk with your health care provider about your test results, treatment options, and if necessary, the need for more tests. Follow these instructions at home: Eating and drinking  Eat a diet that includes fresh fruits and vegetables, whole grains, lean protein, and low-fat dairy products. Limit your intake of foods with high amounts of sugar, saturated fats, and salt. Take vitamin and mineral supplements as recommended by your health care provider. Do not drink alcohol if your health care provider tells you not to drink. If you drink alcohol: Limit how much you have to 0-1 drink a day. Know how much alcohol is in your drink. In the [place] one drink equals one 12 oz bottle of beer (355 mL), one 5 oz glass of wine (148 mL), or one 1 oz glass of hard liquor (44 mL). Lifestyle Brush your teeth every morning and night with fluoride toothpaste. Floss one time each day. Exercise for at least 30 minutes 5 or more days each week. Do not use any products that contain nicotine or tobacco. These products include cigarettes, chewing tobacco, and vaping devices, such as e-cigarettes. If you need help quitting, ask your health care provider. Do not use drugs. If you are sexually active, practice safe sex. Use a condom or other form of protection in order to prevent STIs. Take aspirin only as told by your  health care provider. Make sure that you understand how much to take and what form to take. Work with your health care provider to find out whether it  is safe and beneficial for you to take aspirin daily. °Ask your health care provider if you need to take a cholesterol-lowering medicine (statin). °Find healthy ways to manage stress, such as: °Meditation, yoga, or listening to music. °Journaling. °Talking to a trusted person. °Spending time with friends and family. °Minimize exposure to UV radiation to reduce your risk of skin cancer. °Safety °Always wear your seat belt while driving or riding in a vehicle. °Do not drive: °If you have been drinking alcohol. Do not ride with someone who has been drinking. °When you are tired or distracted. °While texting. °If you have been using any mind-altering substances or drugs. °Wear a helmet and other protective equipment during sports activities. °If you have firearms in your house, make sure you follow all gun safety procedures. °What's next? °Visit your health care provider once a year for an annual wellness visit. °Ask your health care provider how often you should have your eyes and teeth checked. °Stay up to date on all vaccines. °This information is not intended to replace advice given to you by your health care provider. Make sure you discuss any questions you have with your health care provider. °Document Revised: 04/24/2021 Document Reviewed: 04/24/2021 °Elsevier Patient Education © 2022 [employer] ° °

## 2021-09-26 NOTE — Assessment & Plan Note (Signed)
Patient encouraged to maintain heart healthy diet, regular exercise, adequate sleep. Consider daily probiotics. Take medications as prescribed. Labs ordered and reviewed. Immunizations up to date. Eye exam on 01/02/21. Dexa scan ordered. MGM and pap and colonoscopy all up to date.

## 2021-09-26 NOTE — Assessment & Plan Note (Signed)
Avoid offending foods, start probiotics. Do not eat large meals in late evening and consider raising head of bed.  

## 2021-09-26 NOTE — Assessment & Plan Note (Signed)
Encourage heart healthy diet such as MIND or DASH diet, increase exercise, avoid trans fats, simple carbohydrates and processed foods, consider a krill or fish or flaxseed oil cap daily.  °

## 2021-09-27 LAB — VITAMIN D 25 HYDROXY (VIT D DEFICIENCY, FRACTURES): VITD: 50.53 ng/mL (ref 30.00–100.00)

## 2021-09-27 LAB — COMPREHENSIVE METABOLIC PANEL
ALT: 13 U/L (ref 0–35)
AST: 16 U/L (ref 0–37)
Albumin: 4.4 g/dL (ref 3.5–5.2)
Alkaline Phosphatase: 43 U/L (ref 39–117)
BUN: 24 mg/dL — ABNORMAL HIGH (ref 6–23)
CO2: 28 mEq/L (ref 19–32)
Calcium: 9.4 mg/dL (ref 8.4–10.5)
Chloride: 101 mEq/L (ref 96–112)
Creatinine, Ser: 0.86 mg/dL (ref 0.40–1.20)
GFR: 68.07 mL/min (ref >60.00)
Glucose, Bld: 88 mg/dL (ref 70–99)
Potassium: 5.1 mEq/L (ref 3.5–5.1)
Sodium: 138 mEq/L (ref 135–145)
Total Bilirubin: 0.4 mg/dL (ref 0.2–1.2)
Total Protein: 7 g/dL (ref 6.0–8.3)

## 2021-09-27 LAB — LIPID PANEL
Cholesterol: 205 mg/dL — ABNORMAL HIGH (ref 0–200)
HDL: 88.2 mg/dL (ref >39.00)
LDL Cholesterol: 102 mg/dL — ABNORMAL HIGH (ref 0–99)
NonHDL: 117.05
Total CHOL/HDL Ratio: 2
Triglycerides: 75 mg/dL (ref 0.0–149.0)
VLDL: 15 mg/dL (ref 0.0–40.0)

## 2021-09-27 LAB — HEMOGLOBIN A1C: Hgb A1c MFr Bld: 5.6 % (ref 4.6–6.5)

## 2021-09-27 LAB — CBC WITH DIFFERENTIAL/PLATELET
Basophils Absolute: 0 10*3/uL (ref 0.0–0.1)
Basophils Relative: 0.6 % (ref 0.0–3.0)
Eosinophils Absolute: 0.1 10*3/uL (ref 0.0–0.7)
Eosinophils Relative: 1.5 % (ref 0.0–5.0)
HCT: 38.8 % (ref 36.0–46.0)
Hemoglobin: 12.8 g/dL (ref 12.0–15.0)
Lymphocytes Relative: 28 % (ref 12.0–46.0)
Lymphs Abs: 1.6 10*3/uL (ref 0.7–4.0)
MCHC: 33.1 g/dL (ref 30.0–36.0)
MCV: 92.9 fl (ref 78.0–100.0)
Monocytes Absolute: 0.4 10*3/uL (ref 0.1–1.0)
Monocytes Relative: 7.4 % (ref 3.0–12.0)
Neutro Abs: 3.5 10*3/uL (ref 1.4–7.7)
Neutrophils Relative %: 62.5 % (ref 43.0–77.0)
Platelets: 192 10*3/uL (ref 150.0–400.0)
RBC: 4.17 Mil/uL (ref 3.87–5.11)
RDW: 13.7 % (ref 11.5–15.5)
WBC: 5.6 10*3/uL (ref 4.0–10.5)

## 2021-09-27 LAB — TSH: TSH: 1.34 u[IU]/mL (ref 0.35–5.50)

## 2021-09-29 LAB — DRUG MONITORING PANEL 376104, URINE
Alphahydroxyalprazolam: NEGATIVE ng/mL (ref <25)
Alphahydroxymidazolam: NEGATIVE ng/mL (ref <50)
Alphahydroxytriazolam: NEGATIVE ng/mL (ref <50)
Aminoclonazepam: 267 ng/mL — ABNORMAL HIGH (ref <25)
Amphetamines: NEGATIVE ng/mL (ref <500)
Barbiturates: NEGATIVE ng/mL (ref <300)
Benzodiazepines: POSITIVE ng/mL — AB (ref <100)
Cocaine Metabolite: NEGATIVE ng/mL (ref <150)
Codeine: NEGATIVE ng/mL (ref <50)
Desmethyltramadol: NEGATIVE ng/mL (ref <100)
Hydrocodone: NEGATIVE ng/mL (ref <50)
Hydromorphone: NEGATIVE ng/mL (ref <50)
Hydroxyethylflurazepam: NEGATIVE ng/mL (ref <50)
Lorazepam: NEGATIVE ng/mL (ref <50)
Morphine: NEGATIVE ng/mL (ref <50)
Nordiazepam: NEGATIVE ng/mL (ref <50)
Norhydrocodone: NEGATIVE ng/mL (ref <50)
Noroxycodone: 1567 ng/mL — ABNORMAL HIGH (ref <50)
Opiates: NEGATIVE ng/mL (ref <100)
Oxazepam: NEGATIVE ng/mL (ref <50)
Oxycodone: 248 ng/mL — ABNORMAL HIGH (ref <50)
Oxycodone: POSITIVE ng/mL — AB (ref <100)
Oxymorphone: 143 ng/mL — ABNORMAL HIGH (ref <50)
Temazepam: NEGATIVE ng/mL (ref <50)
Tramadol: NEGATIVE ng/mL (ref <100)

## 2021-09-29 LAB — DM TEMPLATE

## 2021-10-21 ENCOUNTER — Ambulatory Visit
Admission: RE | Admit: 2021-10-21 | Discharge: 2021-10-21 | Disposition: A | Discharge status: Home / Self Care | Payer: Medicare HMO | Source: Ambulatory Visit | Attending: Family Medicine | Admitting: Family Medicine

## 2021-10-21 DIAGNOSIS — Z780 Asymptomatic menopausal state: Secondary | ICD-10-CM

## 2021-10-21 DIAGNOSIS — M8589 Other specified disorders of bone density and structure, multiple sites: Secondary | ICD-10-CM | POA: Diagnosis not present

## 2021-11-13 ENCOUNTER — Encounter: Payer: Self-pay | Admitting: Family Medicine

## 2021-11-15 ENCOUNTER — Other Ambulatory Visit: Payer: Self-pay | Admitting: Family Medicine

## 2021-11-15 MED ORDER — ONDANSETRON HCL 4 MG PO TABS: 4.0000 mg | ORAL_TABLET | Freq: Three times a day (TID) | ORAL | 1 refills | Status: DC | PRN

## 2021-11-15 NOTE — Telephone Encounter (Signed)
Pt stated she is still feeling sick and would like to try the Zofran Dr. B suggested. Please advise  [facility] [address] [place], [place] - [address]  [address] [place] [address]  Phone:  [phone]  Fax:  [phone]

## 2021-11-18 NOTE — Telephone Encounter (Signed)
Pt states the zofran helped her for 1 or 2 days but she is back to feeling nauseous and getting weak. She would like advice on what to do, if she needs to come in to the office or what to do. Please advice.

## 2021-11-19 NOTE — Telephone Encounter (Signed)
scheduled

## 2021-11-26 DIAGNOSIS — H43813 Vitreous degeneration, bilateral: Secondary | ICD-10-CM | POA: Diagnosis not present

## 2021-11-26 DIAGNOSIS — H10821 Rosacea conjunctivitis, right eye: Secondary | ICD-10-CM | POA: Diagnosis not present

## 2021-11-26 DIAGNOSIS — H2513 Age-related nuclear cataract, bilateral: Secondary | ICD-10-CM | POA: Diagnosis not present

## 2021-11-29 ENCOUNTER — Telehealth (INDEPENDENT_AMBULATORY_CARE_PROVIDER_SITE_OTHER): Payer: Medicare HMO | Admitting: Family Medicine

## 2021-11-29 DIAGNOSIS — G4700 Insomnia, unspecified: Secondary | ICD-10-CM | POA: Diagnosis not present

## 2021-11-29 DIAGNOSIS — F418 Other specified anxiety disorders: Secondary | ICD-10-CM | POA: Diagnosis not present

## 2021-11-29 DIAGNOSIS — R110 Nausea: Secondary | ICD-10-CM

## 2021-11-29 MED ORDER — ONDANSETRON HCL 4 MG PO TABS: 4.0000 mg | ORAL_TABLET | Freq: Three times a day (TID) | ORAL | 1 refills | Status: DC | PRN

## 2021-11-29 MED ORDER — CLONAZEPAM 1 MG PO TABS: 0.5000 mg | ORAL_TABLET | Freq: Two times a day (BID) | ORAL | 1 refills | Status: DC | PRN

## 2021-11-29 MED ORDER — BUPROPION HCL 75 MG PO TABS: 112.5000 mg | ORAL_TABLET | Freq: Every day | ORAL | 1 refills | Status: DC
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
## 2021-11-29 NOTE — Assessment & Plan Note (Signed)
Her niece is up all night drinking and staying on the phone all night with her niece getting very argumentative.

## 2021-11-29 NOTE — Assessment & Plan Note (Signed)
She has been under a great deal of stress and notes the nausea started with increased stress the combination of Clonazepam and Ondansetron has been helpful. Will continue those two and increase the Wellbutrin reevaluate in 1-2 months. And she will notify us if any worsening GI symptoms develop. Spent over 40 mintues discussing current state and plan of care with patient

## 2021-12-05 ENCOUNTER — Telehealth: Payer: Self-pay | Admitting: Family Medicine

## 2021-12-05 ENCOUNTER — Other Ambulatory Visit: Payer: Self-pay | Admitting: Family Medicine

## 2021-12-05 NOTE — Telephone Encounter (Signed)
Patient stated that she is unable to pickup medication because pharmacy stated they told her she would be unable to fill on 1/31.  I asked pt when did she last fill her bupropion and she stated 1/18 for 90 tabs.  I advised for that one she just need to use what she has until it is taken all up with her new directions. She stated that her clonazepam was last filled on 1/18 (which was for 1tab hs #30) and she stated that Dr. Charlett Blake changed her to 1/2 tab am, 1/2 tab at noon, at 1 tab at night.  She has been taking it like that daily and stated that she needed it because of the custody battle with her grandson.  She sounded very disheveled.  I then called the pharmacy and they stated that the prescriptions for clonazepam and zofran was picked up on 11/29/21.    I called patient back and advised her about what [facility] said and she stated that she did not go to the pharmacy until 11/30/21.  I advised her that she needs to go to [facility] and speak with the pharmacy manager about this situation because there is nothing that we are able to about this since they stated that she picked up the medications.   We may need to look at the controlled substance registry to see if we are able to see what is going on.  Not sure who to believe at this point.

## 2021-12-05 NOTE — Telephone Encounter (Signed)
Pt states when she went to the pharmacy last week, they told her that insurance would not cover it till this week. When she went to pick up medication today, they told her the only rx they received was klonopin, and it would not be covered till the 31st. Pt seem very concerned about this, and states she is out of all of these medications. She would like a call from a nurse, when rx have been resent in.   Medication: ondansetron (ZOFRAN) 4 MG tablet  clonazePAM (KLONOPIN) 1 MG tablet  buPROPion (WELLBUTRIN) 75 MG tablet  Has the patient contacted their pharmacy? Yes.    Preferred Pharmacy: [facility], [place]  [address], [place] [address]  Phone:  [phone]  Fax:  [phone]

## 2021-12-06 ENCOUNTER — Telehealth: Payer: Self-pay

## 2021-12-06 NOTE — Telephone Encounter (Signed)
Initial Comment Caller states the doctor made a change of dosage in her meds, wellbuterin and klozopan and the pharmacy wont fill until the 31st Translation No Disp. Time Eilene Ghazi Time) Disposition Final User 12/05/2021 6:15:22 PM Send To Nurse Graylon Gunning, RN, Rhonda 12/05/2021 6:47:49 PM Attempt made - message left Joie Bimler 12/05/2021 6:48:27 PM Send To RN Personal Lovena Le, RN, Santiago Glad 12/05/2021 6:56:43 PM FINAL ATTEMPT MADE - message left Joie Bimler 12/05/2021 6:56:50 PM Send to RN Final Attempt Johnanna Schneiders, RN, Santiago Glad 12/05/2021 9:11:34 PM FINAL ATTEMPT MADE - message left Yes Oseiwusu, RN, Tillie Rung

## 2021-12-06 NOTE — Telephone Encounter (Signed)
Pt stated she needs clonazepam refilled. Pharmacy advised her that dr. Charlett Blake would have to contact the insurance for an override. Please advise

## 2021-12-06 NOTE — Telephone Encounter (Signed)
Spoke with [place] at [facility] and he checked on the clonazepam and stated that he did not see why it could not be filled and they should be able to get it ready for the patient.   Patient notified and she stated that she was going to call before she goes over to pickup.

## 2021-12-06 NOTE — Telephone Encounter (Signed)
Called and spoke with pharmacist and she stated that she was working on getting patients clonazepam ready.  She was asked to follow up this morning before I called.  Patient last filled the clonazepam in November and they were able to fill the new increased dose.

## 2021-12-10 DIAGNOSIS — M1712 Unilateral primary osteoarthritis, left knee: Secondary | ICD-10-CM | POA: Diagnosis not present

## 2021-12-10 NOTE — Telephone Encounter (Signed)
See telephone note from 12/05/21.

## 2021-12-13 ENCOUNTER — Telehealth: Payer: Self-pay | Admitting: Family Medicine

## 2021-12-13 NOTE — Telephone Encounter (Signed)
Pt states pharmacy will not give her a rx for wellburtin since it has been increased. She stated they told her she could not receive it till the 30th, now they have changed it to the 4th. She is worried because we will be closed tomorrow, and if they don't give her the medication she won't be able to get it fixed till Monday. Please advise.   Medication: buPROPion [facility]) 75 MG tablet  Preferred Pharmacy: [facility], [place]  [address] [place] [address]  Phone:  [phone]  Fax:  [phone]

## 2021-12-16 NOTE — Telephone Encounter (Signed)
She got medication

## 2022-01-07 DIAGNOSIS — H11042 Peripheral pterygium, stationary, left eye: Secondary | ICD-10-CM | POA: Diagnosis not present

## 2022-01-07 DIAGNOSIS — H2513 Age-related nuclear cataract, bilateral: Secondary | ICD-10-CM | POA: Diagnosis not present

## 2022-01-07 DIAGNOSIS — H43813 Vitreous degeneration, bilateral: Secondary | ICD-10-CM | POA: Diagnosis not present

## 2022-01-07 DIAGNOSIS — H04123 Dry eye syndrome of bilateral lacrimal glands: Secondary | ICD-10-CM | POA: Diagnosis not present

## 2022-01-21 ENCOUNTER — Encounter: Payer: Self-pay | Admitting: Family Medicine

## 2022-02-05 ENCOUNTER — Encounter: Payer: Self-pay | Admitting: Family Medicine

## 2022-02-06 ENCOUNTER — Encounter: Payer: Self-pay | Admitting: Family Medicine

## 2022-02-06 ENCOUNTER — Ambulatory Visit (INDEPENDENT_AMBULATORY_CARE_PROVIDER_SITE_OTHER): Payer: Medicare HMO | Admitting: Family Medicine

## 2022-02-06 VITALS — BP 147/81 | HR 67 | Temp 98.0°F | Resp 20 | Ht 59.0 in | Wt 123.6 lb

## 2022-02-06 DIAGNOSIS — J0140 Acute pansinusitis, unspecified: Secondary | ICD-10-CM

## 2022-02-06 DIAGNOSIS — H6122 Impacted cerumen, left ear: Secondary | ICD-10-CM

## 2022-02-06 MED ORDER — FLUTICASONE PROPIONATE 50 MCG/ACT NA SUSP: 2.0000 | Freq: Every day | NASAL | 0 refills | Status: AC

## 2022-02-06 MED ORDER — AZITHROMYCIN 250 MG PO TABS: ORAL_TABLET | ORAL | 0 refills | Status: DC

## 2022-02-06 NOTE — Progress Notes (Signed)
? ?Acute Office Visit ? ?Subjective:  ? ? Patient ID: Stephanie Barnes, female    DOB: 1951/07/01, 71 y.o.   MRN: [id number] ? ?Chief Complaint  ?Patient presents with  ? Nasal Congestion  ?  9 days  ? Chest congestion  ? ? ?Sinusitis ?This is a new problem. The current episode started 1 to 4 weeks ago (9+ days). The problem has been waxing and waning since onset. There has been no fever. The pain is moderate. Associated symptoms include congestion, coughing, headaches and sinus pressure. Pertinent negatives include no chills, diaphoresis, ear pain, hoarse voice, neck pain, shortness of breath, sneezing, sore throat or swollen glands. Treatments tried: Robitussin, Vics VapoRub, Zyrtec. The treatment provided mild relief.  ? ? ? ? ? ? ? ?Past Medical History:  ?Diagnosis Date  ? Abnormal EKG 06/13/2013  ? Anemia 01/13/2016  ? Anxiety   ? Arthritis   ? Complex regional pain syndrome 01/01/2016  ? Depression   ? 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 Distal radius fracture, left   ? 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 Has been seeing ortho and acupuncture in past   ? Medicare annual wellness visit, subsequent 10/02/2015  ? Multiple allergies 03/31/2016  ? Proximal humerus fracture   ? Left  ? Rotator cuff tear   ? 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 Welcome to Medicare preventive visit 10/02/2015  ? ? ?Past Surgical History:  ?Procedure Laterality Date  ? COLONOSCOPY  03/31/2014  ? Dr.Mann-polyp  ? DENTAL SURGERY    ? Root cleaning   ? LASIK    ? MOUTH SURGERY  08/10/2017  ? front lower  ? OPEN REDUCTION INTERNAL FIXATION (ORIF) DISTAL RADIAL FRACTURE Left 03/15/2019  ? Procedure: OPEN REDUCTION INTERNAL FIXATION (ORIF) DISTAL RADIAL FRACTURE;  Surgeon: Roseanne Kaufman, MD;  Location: [facility];  Service: Orthopedics;  Laterality: Left;  ? REFRACTIVE SURGERY Bilateral 2000  ? REVERSE SHOULDER ARTHROPLASTY Left 03/15/2019  ? Procedure: REVERSE SHOULDER ARTHROPLASTY;  Surgeon: Nicholes Stairs, MD;  Location: [facility];  Service: Orthopedics;   Laterality: Left;  256mn  ? ROTATOR CUFF REPAIR Right 2017  ? TONSILLECTOMY    ? ? ?Family History  ?Problem Relation Age of Onset  ? Heart disease Mother   ? Arthritis Mother   ? Heart disease Father   ? COPD Father   ? Colon polyps Father   ? Mental illness Brother   ?     bipolar  ? Hypertension Brother   ? Suicidality Brother   ? Alcohol abuse Brother   ? Heart disease Maternal Aunt   ? Heart disease Maternal Uncle   ? Heart disease Paternal Aunt   ? COPD Paternal Aunt   ? Heart disease Paternal Uncle   ? Heart disease Maternal Grandmother   ? Heart disease Maternal Grandfather   ? Heart disease Paternal Grandmother   ? Heart disease Paternal Grandfather   ? Colon cancer Neg Hx   ? Esophageal cancer Neg Hx   ? Stomach cancer Neg Hx   ? Rectal cancer Neg Hx   ? ? ?Social History  ? ?Socioeconomic History  ? Marital status: Single  ?  Spouse name: Not on file  ? Number of children: Not on file  ? Years of education: Not on file  ? Highest education level: Not on file  ?Occupational History  ? Not on file  ?Tobacco Use  ? Smoking status: Never  ? Smokeless tobacco: Never  ?Vaping Use  ?  Vaping Use: Never used  ?Substance and Sexual Activity  ? Alcohol use: Yes  ?  Alcohol/week: 1.0 standard drink  ?  Types: 1 Glasses of wine per week  ?  Comment: per week  ? Drug use: No  ? Sexual activity: Not Currently  ?  Comment: lives alone, retired from [employer], no dietary restrictions  ?Other Topics Concern  ? Not on file  ?Social History Narrative  ? Not on file  ? ?Social Determinants of Health  ? ?Financial Resource Strain: Low Risk   ? Difficulty of Paying Living Expenses: Not hard at all  ?Food Insecurity: No Food Insecurity  ? Worried About Charity fundraiser in the Last Year: Never true  ? Ran Out of Food in the Last Year: Never true  ?Transportation Needs: No Transportation Needs  ? Lack of Transportation (Medical): No  ? Lack of Transportation (Non-Medical): No  ?Physical Activity: Insufficiently Active   ? Days of Exercise per Week: 1 day  ? Minutes of Exercise per Session: 60 min  ?Stress: No Stress Concern Present  ? Feeling of Stress : Not at all  ?Social Connections: Socially Isolated  ? Frequency of Communication with Friends and Family: More than three times a week  ? Frequency of Social Gatherings with Friends and Family: Once a week  ? Attends Religious Services: Never  ? Active Member of Clubs or Organizations: No  ? Attends Archivist Meetings: Never  ? Marital Status: Never married  ?Intimate Partner Violence: Not At Risk  ? Fear of Current or Ex-Partner: No  ? Emotionally Abused: No  ? Physically Abused: No  ? Sexually Abused: No  ? ? ?Outpatient Medications Prior to Visit  ?Medication Sig Dispense Refill  ? amoxicillin (AMOXIL) 500 MG capsule Take 4 capsules by mouth. 1 hour prior to dental procedure     ? Ascorbic Acid (VITAMIN C) 1000 MG tablet Take 1,000 mg by mouth daily.    ? buPROPion (WELLBUTRIN) 75 MG tablet Take 1.5 tablets (112.5 mg total) by mouth daily. 135 tablet 1  ? Calcium-Magnesium 500-250 MG TABS Take 1 tablet by mouth in the morning and at bedtime.    ? cetirizine (ZYRTEC) 10 MG tablet Take 10 mg by mouth daily as needed for allergies.     ? clonazePAM (KLONOPIN) 1 MG tablet Take 0.5-1 tablets (0.5-1 mg total) by mouth 2 (two) times daily as needed for anxiety. 180 tablet 1  ? Cyanocobalamin (VITAMIN B 12 PO) Take 1,000 mg by mouth daily.     ? Krill Oil 1000 MG CAPS Take 1 capsule by mouth daily.    ? Melatonin-Pyridoxine (MELATONEX) 3-10 MG TBCR Take 1 tablet by mouth at bedtime.    ? mometasone (ELOCON) 0.1 % ointment Apply topically daily. As needed 15 g 2  ? ondansetron (ZOFRAN) 4 MG tablet Take 1 tablet (4 mg total) by mouth every 8 (eight) hours as needed for nausea or vomiting. 60 tablet 1  ? OVER THE COUNTER MEDICATION Apply 1 application topically as needed (pain). Hemp lotion    ? Probiotic Product (PROBIOTIC DAILY PO) Take 1 tablet by mouth daily. NOW 10    ?  TURMERIC PO Take 894 mg by mouth 2 (two) times daily. GAIA brand-Take 1 in the morning and 1 at night.    ? UNABLE TO FIND Take 1 tablet by mouth daily. Med Name: Zinc '15mg'$  with Selenium '200mg'$     ? gabapentin (NEURONTIN) 300 MG capsule Take  1 capsule (300 mg total) by mouth 3 (three) times daily for 21 days. 63 capsule 0  ? XIIDRA 5 % SOLN Apply 1 drop to eye 2 (two) times daily.    ? ?No facility-administered medications prior to visit.  ? ? ?Allergies  ?Allergen Reactions  ? Aspirin Anaphylaxis and Swelling  ?  Swelling of lips and fingers   ? Celebrex [Celecoxib] Anaphylaxis, Hives and Shortness Of Breath  ? Doxycycline Other (See Comments)  ?  Scarred throat   ? Prednisone Shortness Of Breath and Other (See Comments)  ?  Chest tightness   ? Fosamax [Alendronate Sodium] Hives  ? Other Hives and Nausea Only  ?  Arthritec  ? Amitriptyline   ?  UNSPECIFIED REACTION   ? Chlorhexidine   ?  UNSPECIFIED REACTION   ? Celexa [Citalopram] Other (See Comments)  ?  Shakiness   ? Clindamycin/Lincomycin Nausea And Vomiting  ? Tape Itching and Other (See Comments)  ?  UNSPECIFIED REACTION  ?Paper tape is okay to use per patient  ? ? ?Review of Systems ?All review of systems negative except what is listed in the HPI ? ? ?   ?Objective:  ?  ?Physical Exam ?Vitals reviewed.  ?Constitutional:   ?   Appearance: Normal appearance. She is normal weight.  ?HENT:  ?   Head: Normocephalic and atraumatic.  ?   Right Ear: Tympanic membrane normal.  ?   Left Ear: There is impacted cerumen.  ?   Nose: Nose normal.  ?   Mouth/Throat:  ?   Mouth: Mucous membranes are moist.  ?   Pharynx: Oropharynx is clear. No oropharyngeal exudate or posterior oropharyngeal erythema.  ?Eyes:  ?   Conjunctiva/sclera: Conjunctivae normal.  ?Cardiovascular:  ?   Rate and Rhythm: Normal rate and regular rhythm.  ?Pulmonary:  ?   Effort: Pulmonary effort is normal.  ?   Breath sounds: Normal breath sounds.  ?Musculoskeletal:  ?   Cervical back: Normal range of  motion and neck supple. No tenderness.  ?Lymphadenopathy:  ?   Cervical: No cervical adenopathy.  ?Skin: ?   General: Skin is warm and dry.  ?   Capillary Refill: Capillary refill takes less than 2 seconds.

## 2022-02-06 NOTE — Patient Instructions (Signed)
Adding Zpak and Flonase (you have tolerated both of these in the past). Continue supportive measures including rest, hydration, humidifier use, steam showers, warm compresses to sinuses, warm liquids with lemon and honey, and over-the-counter cough, cold, and analgesics as needed.   ?

## 2022-03-18 DIAGNOSIS — H04123 Dry eye syndrome of bilateral lacrimal glands: Secondary | ICD-10-CM | POA: Diagnosis not present

## 2022-03-23 ENCOUNTER — Encounter: Payer: Self-pay | Admitting: Family Medicine

## 2022-03-23 DIAGNOSIS — T2240XA Corrosion of unspecified degree of shoulder and upper limb, except wrist and hand, unspecified site, initial encounter: Secondary | ICD-10-CM | POA: Diagnosis not present

## 2022-03-23 DIAGNOSIS — T320 Corrosions involving less than 10% of body surface: Secondary | ICD-10-CM | POA: Diagnosis not present

## 2022-03-24 ENCOUNTER — Ambulatory Visit (INDEPENDENT_AMBULATORY_CARE_PROVIDER_SITE_OTHER): Payer: Medicare HMO | Admitting: Medical

## 2022-03-24 VITALS — BP 130/60 | HR 80 | Resp 18 | Ht 59.0 in | Wt 122.2 lb

## 2022-03-24 DIAGNOSIS — T304 Corrosion of unspecified body region, unspecified degree: Secondary | ICD-10-CM

## 2022-03-24 MED ORDER — MUPIROCIN CALCIUM 2 % EX CREA: 1.0000 "application " | TOPICAL_CREAM | Freq: Two times a day (BID) | CUTANEOUS | 1 refills | Status: AC

## 2022-03-24 NOTE — Telephone Encounter (Signed)
Pt scheduled to see Percell Miller today.  ?
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
## 2022-03-27 ENCOUNTER — Ambulatory Visit (INDEPENDENT_AMBULATORY_CARE_PROVIDER_SITE_OTHER): Payer: Medicare HMO | Admitting: Family Medicine

## 2022-03-27 ENCOUNTER — Ambulatory Visit: Payer: Medicare HMO | Admitting: Family Medicine

## 2022-03-27 ENCOUNTER — Encounter: Payer: Self-pay | Admitting: Family Medicine

## 2022-03-27 VITALS — BP 160/86 | HR 78 | Ht 59.0 in | Wt 122.0 lb

## 2022-03-27 DIAGNOSIS — F418 Other specified anxiety disorders: Secondary | ICD-10-CM

## 2022-03-27 DIAGNOSIS — T304 Corrosion of unspecified body region, unspecified degree: Secondary | ICD-10-CM

## 2022-03-27 DIAGNOSIS — E559 Vitamin D deficiency, unspecified: Secondary | ICD-10-CM

## 2022-03-27 DIAGNOSIS — Z79899 Other long term (current) drug therapy: Secondary | ICD-10-CM | POA: Diagnosis not present

## 2022-03-27 DIAGNOSIS — E782 Mixed hyperlipidemia: Secondary | ICD-10-CM

## 2022-03-27 NOTE — Patient Instructions (Signed)
Continue with the mupirocin ointment and wraps. Let us know if any new symptoms develop.  Updating labs today. Continue with medications as prescribed, healthy diet, and physical activity as tolerated.
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
## 2022-03-28 ENCOUNTER — Encounter: Payer: Self-pay | Admitting: Family Medicine

## 2022-03-28 LAB — TSH: TSH: 1.76 u[IU]/mL (ref 0.35–5.50)

## 2022-03-28 LAB — CBC
HCT: 38.1 % (ref 36.0–46.0)
Hemoglobin: 12.5 g/dL (ref 12.0–15.0)
MCHC: 32.8 g/dL (ref 30.0–36.0)
MCV: 92.3 fl (ref 78.0–100.0)
Platelets: 216 10*3/uL (ref 150.0–400.0)
RBC: 4.13 Mil/uL (ref 3.87–5.11)
RDW: 13.3 % (ref 11.5–15.5)
WBC: 4.2 10*3/uL (ref 4.0–10.5)

## 2022-03-28 LAB — COMPREHENSIVE METABOLIC PANEL
ALT: 13 U/L (ref 0–35)
AST: 16 U/L (ref 0–37)
Albumin: 4.5 g/dL (ref 3.5–5.2)
Alkaline Phosphatase: 47 U/L (ref 39–117)
BUN: 20 mg/dL (ref 6–23)
CO2: 30 mEq/L (ref 19–32)
Calcium: 9.8 mg/dL (ref 8.4–10.5)
Chloride: 100 mEq/L (ref 96–112)
Creatinine, Ser: 0.87 mg/dL (ref 0.40–1.20)
GFR: 66.9 mL/min (ref >60.00)
Glucose, Bld: 98 mg/dL (ref 70–99)
Potassium: 4.9 mEq/L (ref 3.5–5.1)
Sodium: 139 mEq/L (ref 135–145)
Total Bilirubin: 0.4 mg/dL (ref 0.2–1.2)
Total Protein: 7.2 g/dL (ref 6.0–8.3)

## 2022-03-28 LAB — LIPID PANEL
Cholesterol: 222 mg/dL — ABNORMAL HIGH (ref 0–200)
HDL: 86.3 mg/dL (ref >39.00)
LDL Cholesterol: 120 mg/dL — ABNORMAL HIGH (ref 0–99)
NonHDL: 135.65
Total CHOL/HDL Ratio: 3
Triglycerides: 79 mg/dL (ref 0.0–149.0)
VLDL: 15.8 mg/dL (ref 0.0–40.0)

## 2022-03-28 LAB — VITAMIN D 25 HYDROXY (VIT D DEFICIENCY, FRACTURES): VITD: 41.59 ng/mL (ref 30.00–100.00)

## 2022-03-31 LAB — DRUG MONITORING, PANEL 8 WITH CONFIRMATION, URINE
6 Acetylmorphine: NEGATIVE ng/mL (ref <10)
Alcohol Metabolites: NEGATIVE ng/mL (ref <500)
Alphahydroxyalprazolam: NEGATIVE ng/mL (ref <25)
Alphahydroxymidazolam: NEGATIVE ng/mL (ref <50)
Alphahydroxytriazolam: NEGATIVE ng/mL (ref <50)
Aminoclonazepam: 605 ng/mL — ABNORMAL HIGH (ref <25)
Amphetamines: NEGATIVE ng/mL (ref <500)
Benzodiazepines: POSITIVE ng/mL — AB (ref <100)
Buprenorphine, Urine: NEGATIVE ng/mL (ref <5)
Cocaine Metabolite: NEGATIVE ng/mL (ref <150)
Codeine: NEGATIVE ng/mL (ref <50)
Creatinine: 92.5 mg/dL (ref >=20.0)
Hydrocodone: NEGATIVE ng/mL (ref <50)
Hydromorphone: 878 ng/mL — ABNORMAL HIGH (ref <50)
Hydroxyethylflurazepam: NEGATIVE ng/mL (ref <50)
Lorazepam: NEGATIVE ng/mL (ref <50)
MDMA: NEGATIVE ng/mL (ref <500)
Marijuana Metabolite: 10 ng/mL — ABNORMAL HIGH (ref <5)
Marijuana Metabolite: POSITIVE ng/mL — AB (ref <20)
Morphine: NEGATIVE ng/mL (ref <50)
Nordiazepam: NEGATIVE ng/mL (ref <50)
Norhydrocodone: NEGATIVE ng/mL (ref <50)
Opiates: POSITIVE ng/mL — AB (ref <100)
Oxazepam: NEGATIVE ng/mL (ref <50)
Oxidant: NEGATIVE ug/mL (ref <200)
Oxycodone: NEGATIVE ng/mL (ref <100)
Temazepam: NEGATIVE ng/mL (ref <50)
pH: 6.9 (ref 4.5–9.0)

## 2022-03-31 LAB — DM TEMPLATE

## 2022-04-05 ENCOUNTER — Encounter: Payer: Self-pay | Admitting: Family Medicine

## 2022-04-08 ENCOUNTER — Other Ambulatory Visit: Payer: Self-pay | Admitting: Family Medicine

## 2022-04-08 MED ORDER — CLONAZEPAM 1 MG PO TABS: 0.5000 mg | ORAL_TABLET | Freq: Three times a day (TID) | ORAL | 1 refills | Status: DC | PRN

## 2022-04-08 MED ORDER — BUPROPION HCL 75 MG PO TABS
75.0000 mg | ORAL_TABLET | Freq: Two times a day (BID) | ORAL | 1 refills | Status: DC
Start: 2022-04-08 — End: 2022-10-10

## 2022-04-28 NOTE — Progress Notes (Unsigned)
Subjective:   Stephanie Barnes is a 71 y.o. female who presents for Medicare Annual (Subsequent) preventive examination.  Review of Systems     Cardiac Risk Factors include: advanced age (>82mn, >>28women);dyslipidemia     Objective:    Today's Vitals   04/29/22 1444  BP: (!) 164/84  Pulse: (!) 120  Resp: 16  Temp: 97.8 F (36.6 C)  SpO2: 100%  Weight: 124 lb 12.8 oz (56.6 kg)  Height: '4\' 11"'$  (1.499 m)  PainSc: 4    Body mass index is 25.21 kg/m.     04/29/2022    2:31 PM 04/24/2021    3:04 PM 08/12/2019    1:10 PM 03/15/2019    6:00 PM 03/10/2019    2:19 PM 04/01/2018    9:14 AM 03/30/2017   10:34 AM  Advanced Directives  Does Patient Have a Medical Advance Directive? Yes Yes Yes Yes Yes Yes Yes  Type of AParamed[place]of [place] of facility DNR (pink MOST or yellow form);Living will [place]Cla[place]Living will HWine[place]Living will HSylvan SpringsLiving will HBrowntonLiving will HDannebrogLiving will HClevelandLiving will  Does patient want to make changes to medical advance directive? No - Patient declined  No - Patient declined No - Patient declined No - Patient declined No - Patient declined No - Patient declined  Copy of [place] Chart? Yes - validated most recent copy scanned in chart (See row information) Yes - validated most recent copy scanned in chart (See row information) Yes - validated most recent copy scanned in chart (See row information)  No - copy requested Yes No - copy requested    Current Medications (verified) Outpatient Encounter Medications as of 04/29/2022  Medication Sig   amoxicillin (AMOXIL) 500 MG capsule Take 4 capsules by mouth. 1 hour prior to dental procedure    Ascorbic Acid (VITAMIN C) 1000 MG tablet Take 1,000 mg by mouth daily.   buPROPion (WELLBUTRIN) 75 MG tablet Take 1 tablet (75 mg  total) by mouth 2 (two) times daily.   Calcium-Magnesium 500-250 MG TABS Take 1 tablet by mouth in the morning and at bedtime.   cetirizine (ZYRTEC) 10 MG tablet Take 10 mg by mouth daily as needed for allergies.    clonazePAM (KLONOPIN) 1 MG tablet Take 0.5-1 tablets (0.5-1 mg total) by mouth 3 (three) times daily as needed for anxiety.   Cyanocobalamin (VITAMIN B 12 PO) Take 1,000 mg by mouth daily.    fluticasone (FLONASE) 50 MCG/ACT nasal spray Place 2 sprays into both nostrils daily.   Krill Oil 1000 MG CAPS Take 1 capsule by mouth daily.   Melatonin-Pyridoxine (MELATONEX) 3-10 MG TBCR Take 1 tablet by mouth at bedtime.   mometasone (ELOCON) 0.1 % ointment Apply topically daily. As needed   mupirocin cream (BACTROBAN) 2 % Apply 1 application. topically 2 (two) times daily.   OVER THE COUNTER MEDICATION Apply 1 application topically as needed (pain). Hemp lotion   Probiotic Product (PROBIOTIC DAILY PO) Take 1 tablet by mouth daily. NOW 10   TURMERIC PO Take 894 mg by mouth 2 (two) times daily. GAIA brand-Take 1 in the morning and 1 at night.   UNABLE TO FIND Take 1 tablet by mouth daily. Med Name: Zinc '15mg'$  with Selenium '200mg'$    XIIDRA 5 % SOLN Apply 1 drop to eye 2 (two) times daily.   gabapentin (NEURONTIN) 300  MG capsule Take 1 capsule (300 mg total) by mouth 3 (three) times daily for 21 days.   No facility-administered encounter medications on file as of 04/29/2022.    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   Welcome to Medicare preventive visit 10/02/2015   Past Surgical History:  Procedure Laterality Date   COLONOSCOPY  03/31/2014   Dr.Mann-polyp   DENTAL SURGERY     Root cleaning    LASIK     MOUTH SURGERY  08/10/2017   front lower   OPEN REDUCTION INTERNAL FIXATION (ORIF) DISTAL RADIAL FRACTURE Left 03/15/2019   Procedure: OPEN REDUCTION INTERNAL FIXATION (ORIF) DISTAL RADIAL FRACTURE;  Surgeon: Roseanne Kaufman, MD;  Location: [facility];  Service: Orthopedics;  Laterality: Left;   REFRACTIVE SURGERY Bilateral 2000   REVERSE SHOULDER ARTHROPLASTY Left 03/15/2019   Procedure: REVERSE SHOULDER ARTHROPLASTY;  Surgeon: Nicholes Stairs, MD;  Location: [facility];  Service: Orthopedics;  Laterality: Left;  264mn   ROTATOR CUFF REPAIR Right 2017   TONSILLECTOMY     Family History  Problem Relation Age of Onset   Heart disease Mother    Arthritis Mother    Heart disease Father    COPD Father    Colon polyps Father    Mental illness Brother        bipolar   Hypertension Brother    Suicidality Brother    Alcohol abuse Brother    Heart disease Maternal Aunt    Heart disease Maternal Uncle    Heart disease Paternal Aunt    COPD Paternal Aunt    Heart disease Paternal Uncle    Heart disease Maternal Grandmother    Heart disease Maternal Grandfather    Heart disease Paternal Grandmother    Heart disease Paternal Grandfather    Colon cancer Neg Hx    Esophageal cancer Neg Hx    Stomach cancer Neg Hx    Rectal cancer Neg Hx    Social History   Socioeconomic History   Marital status: Single    Spouse name: Not on file   Number of children: Not on file   Years of education: Not on file   Highest education level: Not on file  Occupational History   Not on file  Tobacco Use   Smoking status: Never   Smokeless  tobacco: Never  Vaping Use   Vaping Use: Never used  Substance and Sexual Activity   Alcohol use: Yes    Alcohol/week: 1.0 standard drink of alcohol    Types: 1 Glasses of wine per week    Comment: per week   Drug use: No   Sexual activity: Not Currently    Comment: lives alone, retired from [employer] no dietary restrictions  Other Topics Concern   Not on file  Social History Narrative   Not on file  Social Determinants of Health   Financial Resource Strain: Low Risk  (04/24/2021)   Overall Financial Resource Strain (CARDIA)    Difficulty of Paying Living Expenses: Not hard at all  Food Insecurity: No Food Insecurity (04/24/2021)   Hunger Vital Sign    Worried About Running Out of Food in the Last Year: Never true    Ran Out of Food in the Last Year: Never true  Transportation Needs: No Transportation Needs (04/24/2021)   PRAPARE - Hydrologist (Medical): No    Lack of Transportation (Non-Medical): No  Physical Activity: Insufficiently Active (04/24/2021)   Exercise Vital Sign    Days of Exercise per Week: 1 day    Minutes of Exercise per Session: 60 min  Stress: No Stress Concern Present (04/24/2021)   [place]    Feeling of Stress : Not at all  Social Connections: Socially Isolated (04/24/2021)   Social Connection and Isolation Panel [NHANES]    Frequency of Communication with Friends and Family: More than three times a week    Frequency of Social Gatherings with Friends and Family: Once a week    Attends Religious Services: Never    Marine scientist or Organizations: No    Attends Music therapist: Never    Marital Status: Never married    Tobacco Counseling Counseling given: Not Answered   Clinical Intake:  Pre-visit preparation completed: Yes  Pain : 0-10 Pain Score: 4  Pain Type: Chronic pain Pain Location: Other (Comment) (shoulders,  hands, elbows) Pain Descriptors / Indicators: Dull, Aching, Sore Pain Onset: More than a month ago Pain Frequency: Intermittent     Nutritional Risks: None Diabetes: No  How often do you need to have someone help you when you read instructions, pamphlets, or other written materials from your doctor or pharmacy?: 1 - Never  Diabetic?No  Interpreter Needed?: No  Information entered by :: [place] of Daily Living    04/29/2022    2:54 PM  In your present state of health, do you have any difficulty performing the following activities:  Hearing? 0  Vision? 0  Difficulty concentrating or making decisions? 0  Walking or climbing stairs? 0  Dressing or bathing? 0  Doing errands, shopping? 0  Preparing Food and eating ? N  Using the Toilet? N  In the past six months, have you accidently leaked urine? N  Do you have problems with loss of bowel control? N  Managing your Medications? N  Managing your Finances? N  Housekeeping or managing your Housekeeping? N    Patient Care Team: Mosie Lukes, MD as PCP - General (Family Medicine) Juanita Craver, MD as Consulting Physician (Gastroenterology) Percival Spanish, PT as Physical Therapist (Physical Therapy) Susa Day, MD as Consulting Physician (Orthopedic Surgery)  Indicate any recent Medical Services you may have received from other than [facility] providers in the past year (date may be approximate).     Assessment:   This is a routine wellness examination for [place].  Hearing/Vision screen No results found.  Dietary issues and exercise activities discussed: Current Exercise Habits: Home exercise routine, Type of exercise: walking, Time (Minutes): 45, Frequency (Times/Week): 7, Weekly Exercise (Minutes/Week): 315, Intensity: Mild   Goals Addressed               This Visit's Progress     Maintain healthy eating lifestyle. (pt-stated)   On track  Depression Screen    04/29/2022    2:54 PM  04/29/2022    2:43 PM 09/26/2021    3:11 PM 04/24/2021    3:07 PM 03/25/2021    9:53 AM 08/12/2019    1:19 PM 04/01/2018    9:15 AM  PHQ 2/9 Scores  PHQ - 2 Score 0 0 1 0 1 0 0  PHQ- 9 Score   1  1      Fall Risk    04/29/2022    2:40 PM 04/24/2021    3:06 PM 03/25/2021    9:52 AM 08/12/2019    1:19 PM 04/01/2018    9:15 AM  [place] in the past year? 0 0 1 1 No  Number falls in past yr: 0 0 1 0   Injury with Fall? 0 0 0 1   Risk for fall due to : No Fall Risks      Follow up Falls evaluation completed Falls prevention discussed       FALL RISK PREVENTION PERTAINING TO THE HOME:  Any stairs in or around the home? No  If so, are there any without handrails?  N/A Home free of loose throw rugs in walkways, pet beds, electrical cords, etc? Yes  Adequate lighting in your home to reduce risk of falls? Yes   ASSISTIVE DEVICES UTILIZED TO PREVENT FALLS:  Life alert? No  Use of a cane, walker or w/c? No  Grab bars in the bathroom? Yes  Shower chair or bench in shower? Yes  Elevated toilet seat or a handicapped toilet? No   TIMED UP AND GO:  Was the test performed? Yes .  Length of time to ambulate 10 feet: 10 sec.   Gait steady and fast without use of assistive device  Cognitive Function:        04/29/2022    3:15 PM  6CIT Screen  What Year? 0 points  What month? 0 points  What time? 0 points  Count back from 20 0 points  Months in reverse 0 points  Repeat phrase 0 points  Total Score 0 points    Immunizations Immunization History  Administered Date(s) Administered   Influenza, High Dose Seasonal PF 08/03/2018, 07/11/2019, 07/11/2019   Influenza, Seasonal, Injecte, Preservative Fre 12/02/2012   Influenza,inj,Quad PF,6+ Mos 07/31/2014, 10/02/2015, 09/19/2016   Influenza-Unspecified 08/12/2017, 08/06/2020, 08/27/2021   PFIZER(Purple Top)SARS-COV-2 Vaccination 12/17/2019, 01/11/2020, 08/06/2020, 02/25/2021   Pfizer Covid-19 Vaccine Bivalent Booster 24yr &  up 08/27/2021   Pneumococcal Conjugate-13 10/02/2015   Pneumococcal Polysaccharide-23 05/14/2017   Zoster Recombinat (Shingrix) 08/08/2019, 10/14/2019   Zoster, Live 12/02/2012    TDAP status: Due, Education has been provided regarding the importance of this vaccine. Advised may receive this vaccine at local pharmacy or Health Dept. Aware to provide a copy of the vaccination record if obtained from local pharmacy or Health Dept. Verbalized acceptance and understanding.  Flu Vaccine status: Up to date  Pneumococcal vaccine status: Up to date  Covid-19 vaccine status: Completed vaccines  Qualifies for Shingles Vaccine? No   Zostavax completed No   Shingrix Completed?: Yes  Screening Tests Health Maintenance  Topic Date Due   OPHTHALMOLOGY EXAM  Never done   INFLUENZA VACCINE  06/10/2022   MAMMOGRAM  06/14/2023   TETANUS/TDAP  06/14/2023   COLONOSCOPY (Pts 45-42yrInsurance coverage will need to be confirmed)  03/20/2024   Pneumonia Vaccine 6531Years old  Completed   DEXA SCAN  Completed   COVID-19 Vaccine  Completed   Hepatitis C Screening  Completed   Zoster Vaccines- Shingrix  Completed   HPV VACCINES  Aged Out   FOOT EXAM  Discontinued   HEMOGLOBIN A1C  Discontinued   URINE MICROALBUMIN  Discontinued    Health Maintenance  Health Maintenance Due  Topic Date Due   OPHTHALMOLOGY EXAM  Never done    Colorectal cancer screening: Type of screening: Colonoscopy. Completed 03/20/21. Repeat every 3 years  Mammogram status: Completed 06/13/21. Repeat every year  Bone Density status: Completed 10/21/21. Results reflect: Bone density results: OSTEOPENIA. Repeat every 2 years.  Lung Cancer Screening: (Low Dose CT Chest recommended if Age 19-80 years, 30 pack-year currently smoking OR have quit w/in 15years.) does not qualify.   Lung Cancer Screening Referral: N/A  Additional Screening:  Hepatitis C Screening: does qualify; Completed 09/19/16  Vision Screening:  Recommended annual ophthalmology exams for early detection of glaucoma and other disorders of the eye. Is the patient up to date with their annual eye exam?  Yes  Who is the provider or what is the name of the office in which the patient attends annual eye exams? [facility]  If pt is not established with a provider, would they like to be referred to a provider to establish care? No .   Dental Screening: Recommended annual dental exams for proper oral hygiene  Community Resource Referral / Chronic Care Management: CRR required this visit?  No   CCM required this visit?  No      Plan:     I have personally reviewed and noted the following in the patient's chart:   Medical and social history Use of alcohol, tobacco or illicit drugs  Current medications and supplements including opioid prescriptions.  Functional ability and status Nutritional status Physical activity Advanced directives List of other physicians Hospitalizations, surgeries, and ER visits in previous 12 months Vitals Screenings to include cognitive, depression, and falls Referrals and appointments  In addition, I have reviewed and discussed with patient certain preventive protocols, quality metrics, and best practice recommendations. A written personalized care plan for preventive services as well as general preventive health recommendations were provided to patient.     Duard Brady Tyeson [place], [place]   04/29/2022   Nurse Notes: none

## 2022-04-29 ENCOUNTER — Ambulatory Visit (INDEPENDENT_AMBULATORY_CARE_PROVIDER_SITE_OTHER): Payer: Medicare HMO

## 2022-04-29 VITALS — BP 182/84 | HR 120 | Temp 97.8°F | Resp 16 | Ht 59.0 in | Wt 124.8 lb

## 2022-04-29 DIAGNOSIS — Z0000 Encounter for general adult medical examination without abnormal findings: Secondary | ICD-10-CM | POA: Diagnosis not present

## 2022-04-29 NOTE — Patient Instructions (Signed)
[name] , Thank you for taking time to come for your Medicare Wellness Visit. I appreciate your ongoing commitment to your health goals. Please review the following plan we discussed and let me know if I can assist you in the future.   Screening recommendations/referrals: Colonoscopy: 03/20/21 due 03/20/24 Mammogram: 06/13/21 due 06/13/22 Bone Density: 10/21/21 due 10/22/23 Recommended yearly ophthalmology/optometry visit for glaucoma screening and checkup Recommended yearly dental visit for hygiene and checkup  Vaccinations: Influenza vaccine: up to date Pneumococcal vaccine: up to date Tdap vaccine: Due-May obtain vaccine at your local pharmacy.  Shingles vaccine: up to date   Covid-19:up to date  Advanced directives: yes, on file  Conditions/risks identified: see problem list   Next appointment: Follow up in one year for your annual wellness visit 05/01/23   Preventive Care 65 Years and Older, Female Preventive care refers to lifestyle choices and visits with your health care provider that can promote health and wellness. What does preventive care include? A yearly physical exam. This is also called an annual well check. Dental exams once or twice a year. Routine eye exams. Ask your health care provider how often you should have your eyes checked. Personal lifestyle choices, including: Daily care of your teeth and gums. Regular physical activity. Eating a healthy diet. Avoiding tobacco and drug use. Limiting alcohol use. Practicing safe sex. Taking low-dose aspirin every day. Taking vitamin and mineral supplements as recommended by your health care provider. What happens during an annual well check? The services and screenings done by your health care provider during your annual well check will depend on your age, overall health, lifestyle risk factors, and family history of disease. Counseling  Your health care provider may ask you questions about your: Alcohol  use. Tobacco use. Drug use. Emotional well-being. Home and relationship well-being. Sexual activity. Eating habits. History of falls. Memory and ability to understand (cognition). Work and work Statistician. Reproductive health. Screening  You may have the following tests or measurements: Height, weight, and BMI. Blood pressure. Lipid and cholesterol levels. These may be checked every 5 years, or more frequently if you are over 61 years old. Skin check. Lung cancer screening. You may have this screening every year starting at age 37 if you have a 30-pack-year history of smoking and currently smoke or have quit within the past 15 years. Fecal occult blood test (FOBT) of the stool. You may have this test every year starting at age 12. Flexible sigmoidoscopy or colonoscopy. You may have a sigmoidoscopy every 5 years or a colonoscopy every 10 years starting at age 9. Hepatitis C blood test. Hepatitis B blood test. Sexually transmitted disease (STD) testing. Diabetes screening. This is done by checking your blood sugar (glucose) after you have not eaten for a while (fasting). You may have this done every 1-3 years. Bone density scan. This is done to screen for osteoporosis. You may have this done starting at age 11. Mammogram. This may be done every 1-2 years. Talk to your health care provider about how often you should have regular mammograms. Talk with your health care provider about your test results, treatment options, and if necessary, the need for more tests. Vaccines  Your health care provider may recommend certain vaccines, such as: Influenza vaccine. This is recommended every year. Tetanus, diphtheria, and acellular pertussis (Tdap, Td) vaccine. You may need a Td booster every 10 years. Zoster vaccine. You may need this after age 62. Pneumococcal 13-valent conjugate (PCV13) vaccine. One dose is recommended  after age 26. Pneumococcal polysaccharide (PPSV23) vaccine. One dose is  recommended after age 80. Talk to your health care provider about which screenings and vaccines you need and how often you need them. This information is not intended to replace advice given to you by your health care provider. Make sure you discuss any questions you have with your health care provider. Document Released: 11/23/2015 Document Revised: 07/16/2016 Document Reviewed: 08/28/2015 Elsevier Interactive Patient Education  2017 [employer] Prevention in the Home Falls can cause injuries. They can happen to people of all ages. There are many things you can do to make your home safe and to help prevent falls. What can I do on the outside of my home? Regularly fix the edges of walkways and driveways and fix any cracks. Remove anything that might make you trip as you walk through a door, such as a raised step or threshold. Trim any bushes or trees on the path to your home. Use bright outdoor lighting. Clear any walking paths of anything that might make someone trip, such as rocks or tools. Regularly check to see if handrails are loose or broken. Make sure that both sides of any steps have handrails. Any raised decks and porches should have guardrails on the edges. Have any leaves, snow, or ice cleared regularly. Use sand or salt on walking paths during winter. Clean up any spills in your garage right away. This includes oil or grease spills. What can I do in the bathroom? Use night lights. Install grab bars by the toilet and in the tub and shower. Do not use towel bars as grab bars. Use non-skid mats or decals in the tub or shower. If you need to sit down in the shower, use a plastic, non-slip stool. Keep the floor dry. Clean up any water that spills on the floor as soon as it happens. Remove soap buildup in the tub or shower regularly. Attach bath mats securely with double-sided non-slip rug tape. Do not have throw rugs and other things on the floor that can make you  trip. What can I do in the bedroom? Use night lights. Make sure that you have a light by your bed that is easy to reach. Do not use any sheets or blankets that are too big for your bed. They should not hang down onto the floor. Have a firm chair that has side arms. You can use this for support while you get dressed. Do not have throw rugs and other things on the floor that can make you trip. What can I do in the kitchen? Clean up any spills right away. Avoid walking on wet floors. Keep items that you use a lot in easy-to-reach places. If you need to reach something above you, use a strong step stool that has a grab bar. Keep electrical cords out of the way. Do not use floor polish or wax that makes floors slippery. If you must use wax, use non-skid floor wax. Do not have throw rugs and other things on the floor that can make you trip. What can I do with my stairs? Do not leave any items on the stairs. Make sure that there are handrails on both sides of the stairs and use them. Fix handrails that are broken or loose. Make sure that handrails are as long as the stairways. Check any carpeting to make sure that it is firmly attached to the stairs. Fix any carpet that is loose or worn. Avoid having throw  rugs at the top or bottom of the stairs. If you do have throw rugs, attach them to the floor with carpet tape. Make sure that you have a light switch at the top of the stairs and the bottom of the stairs. If you do not have them, ask someone to add them for you. What else can I do to help prevent falls? Wear shoes that: Do not have high heels. Have rubber bottoms. Are comfortable and fit you well. Are closed at the toe. Do not wear sandals. If you use a stepladder: Make sure that it is fully opened. Do not climb a closed stepladder. Make sure that both sides of the stepladder are locked into place. Ask someone to hold it for you, if possible. Clearly mark and make sure that you can  see: Any grab bars or handrails. First and last steps. Where the edge of each step is. Use tools that help you move around (mobility aids) if they are needed. These include: Canes. Walkers. Scooters. Crutches. Turn on the lights when you go into a dark area. Replace any light bulbs as soon as they burn out. Set up your furniture so you have a clear path. Avoid moving your furniture around. If any of your floors are uneven, fix them. If there are any pets around you, be aware of where they are. Review your medicines with your doctor. Some medicines can make you feel dizzy. This can increase your chance of falling. Ask your doctor what other things that you can do to help prevent falls. This information is not intended to replace advice given to you by your health care provider. Make sure you discuss any questions you have with your health care provider. Document Released: 08/23/2009 Document Revised: 04/03/2016 Document Reviewed: 12/01/2014 Elsevier Interactive Patient Education  2017 [employer].

## 2022-06-27 DIAGNOSIS — M1712 Unilateral primary osteoarthritis, left knee: Secondary | ICD-10-CM | POA: Diagnosis not present

## 2022-07-03 ENCOUNTER — Encounter: Payer: Self-pay | Admitting: Family Medicine

## 2022-07-04 MED ORDER — CLONAZEPAM 1 MG PO TABS: 0.5000 mg | ORAL_TABLET | Freq: Three times a day (TID) | ORAL | 1 refills | Status: DC | PRN

## 2022-07-04 NOTE — Telephone Encounter (Signed)
Requesting: clonazepam '1mg'$   Contract: 03/25/21 UDS: 03/27/22 Last Visit: 03/27/22 w/ Lovena Le Next Visit: 09/29/22  Last Refill: 04/08/22 #180 and 1RF Pt sig: 1/2 to 1 tid prn  Please Advise

## 2022-07-22 ENCOUNTER — Encounter: Payer: Self-pay | Admitting: Family Medicine

## 2022-08-18 ENCOUNTER — Other Ambulatory Visit: Payer: Self-pay | Admitting: Family Medicine

## 2022-08-18 DIAGNOSIS — Z1231 Encounter for screening mammogram for malignant neoplasm of breast: Secondary | ICD-10-CM

## 2022-09-16 ENCOUNTER — Ambulatory Visit (INDEPENDENT_AMBULATORY_CARE_PROVIDER_SITE_OTHER): Payer: Medicare HMO | Admitting: Medical

## 2022-09-16 VITALS — BP 138/88 | HR 80 | Temp 98.4°F | Resp 18 | Ht 59.0 in | Wt 121.4 lb

## 2022-09-16 DIAGNOSIS — R110 Nausea: Secondary | ICD-10-CM | POA: Diagnosis not present

## 2022-09-16 DIAGNOSIS — R5383 Other fatigue: Secondary | ICD-10-CM | POA: Diagnosis not present

## 2022-09-16 DIAGNOSIS — K861 Other chronic pancreatitis: Secondary | ICD-10-CM | POA: Diagnosis not present

## 2022-09-16 MED ORDER — FAMOTIDINE 20 MG PO TABS: 20.0000 mg | ORAL_TABLET | Freq: Every day | ORAL | 0 refills | Status: DC

## 2022-09-16 MED ORDER — ONDANSETRON HCL 4 MG PO TABS: 4.0000 mg | ORAL_TABLET | Freq: Three times a day (TID) | ORAL | 0 refills | Status: DC | PRN

## 2022-09-16 NOTE — Patient Instructions (Addendum)
Nausea but no vomiting. No abdomen pain  on exam and no urinary symptoms. On discussion some occasional belching. Will get cbc, cmp and lipase labs.  For fatigue b12, b1 and tsh.  Rx zofran 4 mg every 8 hours prn nausea. You could try 8 mg dose to see if helps.  Hydrate well and bland diet.  Rx famotadine as trial.  For mood and anxiety continue with wellbutrin and clonazepam. If labs negative and not responding to above then stick to dosing regimen of wellbutrin and clonazepam as Dr. Charlett Blake advised.   Follow up 10 days or sooner if needed.

## 2022-09-16 NOTE — Progress Notes (Signed)
Subjective:    Patient ID: Stephanie Barnes, female    DOB: October 06, 1951, 71 y.o.   MRN: [id number]  HPI  She states is feeling nausea since last Friday. Has not vomited. Pt states not much of appetite. She states can eat brown rice and some cherrios. She is drinking water but not much. Pt states friend of her gave her marijuana. She tried of and on for couple of months.    Pt states a lot of things gong on. Pt states her niece passed away in 03-01-2023. Pt states on piror visit with Dr. Charlett Blake and increased her wellbutrin and clonazpeam. Pt states son of niece is 36 yo. She worries about him. She wander is nauseu is associated underlying anxiety.  Pt clarifies she only taking 1 wellbutirn and 1/2  tab at night. She is using only clonazpem 1 mg 1/2-1 tab every 8 hours as needed.  Pt states also that recently her dilaudid more than 2 months ago. Gabapentin stopped one month ago.   States feels tired.    Review of Systems  Constitutional:  Negative for chills, fatigue and fever.  Respiratory:  Negative for cough, chest tightness, shortness of breath and wheezing.   Cardiovascular:  Negative for chest pain and palpitations.  Genitourinary:  Negative for dyspareunia, dysuria, genital sores, menstrual problem and urgency.  Musculoskeletal:  Negative for back pain.  Neurological:  Negative for seizures, syncope, facial asymmetry, weakness and light-headedness.  Hematological:  Negative for adenopathy. Does not bruise/bleed easily.  Psychiatric/Behavioral:  Positive for behavioral problems. Negative for decreased concentration and hallucinations. The patient is nervous/anxious.     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   Welcome to Medicare preventive visit 10/02/2015     Social History   Socioeconomic History   Marital status: Single    Spouse name: Not on file   Number of children: Not on file   Years of education: Not on file   Highest education level: Not on file  Occupational History   Not on file  Tobacco Use   Smoking status: Never   Smokeless tobacco: Never  Vaping Use   Vaping Use: Never used  Substance and Sexual Activity   Alcohol use: Yes    Alcohol/week: 1.0 standard drink of alcohol    Types: 1 Glasses of wine per week    Comment: per week   Drug use: No   Sexual activity: Not Currently    Comment: lives alone, retired from [employer], no dietary restrictions  Other Topics Concern   Not on file  Social History Narrative   Not on file   Social Determinants of Health   Financial Resource Strain: Low Risk  (04/24/2021)   Overall Financial Resource Strain (CARDIA)    Difficulty of Paying Living Expenses: Not hard at all  Food Insecurity: No Food Insecurity (04/24/2021)   Hunger Vital Sign    Worried About Running Out of Food in the Last Year: Never  true    Ran Out of Food in the Last Year: Never true  Transportation Needs: No Transportation Needs (04/24/2021)   PRAPARE - Hydrologist (Medical): No    Lack of Transportation (Non-Medical): No  Physical Activity: Insufficiently Active (04/24/2021)   Exercise Vital Sign    Days of Exercise per Week: 1 day    Minutes of Exercise per Session: 60 min  Stress: No Stress Concern Present (04/24/2021)   [place]    Feeling of Stress : Not at all  Social Connections: Socially Isolated (04/24/2021)   Social Connection and Isolation Panel [NHANES]    Frequency of  Communication with Friends and Family: More than three times a week    Frequency of Social Gatherings with Friends and Family: Once a week    Attends Religious Services: Never    Marine scientist or Organizations: No    Attends Archivist Meetings: Never    Marital Status: Never married  Intimate Partner Violence: Not At Risk (04/24/2021)   Humiliation, Afraid, Rape, and Kick questionnaire    Fear of Current or Ex-Partner: No    Emotionally Abused: No    Physically Abused: No    Sexually Abused: No    Past Surgical History:  Procedure Laterality Date   COLONOSCOPY  03/31/2014   Dr.Mann-polyp   DENTAL SURGERY     Root cleaning    LASIK     MOUTH SURGERY  08/10/2017   front lower   OPEN REDUCTION INTERNAL FIXATION (ORIF) DISTAL RADIAL FRACTURE Left 03/15/2019   Procedure: OPEN REDUCTION INTERNAL FIXATION (ORIF) DISTAL RADIAL FRACTURE;  Surgeon: Roseanne Kaufman, MD;  Location: [place];  Service: Orthopedics;  Laterality: Left;   REFRACTIVE SURGERY Bilateral 2000   REVERSE SHOULDER ARTHROPLASTY Left 03/15/2019   Procedure: REVERSE SHOULDER ARTHROPLASTY;  Surgeon: Nicholes Stairs, MD;  Location: [place];  Service: Orthopedics;  Laterality: Left;  257mn   ROTATOR CUFF REPAIR Right 2017   TONSILLECTOMY      Family History  Problem Relation Age of Onset   Heart disease Mother    Arthritis Mother    Heart disease Father    COPD Father    Colon polyps Father    Mental illness Brother        bipolar   Hypertension Brother    Suicidality Brother    Alcohol abuse Brother    Heart disease Maternal Aunt    Heart disease Maternal Uncle    Heart disease Paternal Aunt    COPD Paternal Aunt    Heart disease Paternal Uncle    Heart disease Maternal Grandmother    Heart disease Maternal Grandfather    Heart disease Paternal Grandmother    Heart disease Paternal Grandfather    Colon cancer Neg Hx    Esophageal cancer Neg Hx    Stomach cancer Neg Hx    Rectal cancer Neg  Hx     Allergies  Allergen Reactions   Aspirin Anaphylaxis and Swelling    Swelling of lips and fingers    Celebrex [Celecoxib] Anaphylaxis, Hives and Shortness Of Breath   Doxycycline Other (See Comments)    Scarred throat    Prednisone Shortness Of Breath and Other (See Comments)    Chest tightness    Fosamax [Alendronate Sodium] Hives   Other Hives and Nausea Only    Arthritec   Amitriptyline     UNSPECIFIED REACTION  Chlorhexidine     UNSPECIFIED REACTION    Celexa [Citalopram] Other (See Comments)    Shakiness    Clindamycin/Lincomycin Nausea And Vomiting   Tape Itching and Other (See Comments)    UNSPECIFIED REACTION  Paper tape is okay to use per patient    Current Outpatient Medications on File Prior to Visit  Medication Sig Dispense Refill   buPROPion (WELLBUTRIN) 75 MG tablet Take 1 tablet (75 mg total) by mouth 2 (two) times daily. 180 tablet 1   Calcium-Magnesium 500-250 MG TABS Take 1 tablet by mouth in the morning and at bedtime.     cetirizine (ZYRTEC) 10 MG tablet Take 10 mg by mouth daily as needed for allergies.      clonazePAM (KLONOPIN) 1 MG tablet Take 0.5-1 tablets (0.5-1 mg total) by mouth 3 (three) times daily as needed for anxiety. 180 tablet 1   fluticasone (FLONASE) 50 MCG/ACT nasal spray Place 2 sprays into both nostrils daily. 1 g 0   Krill Oil 1000 MG CAPS Take 1 capsule by mouth daily.     Melatonin-Pyridoxine (MELATONEX) 3-10 MG TBCR Take 1 tablet by mouth at bedtime.     mometasone (ELOCON) 0.1 % ointment Apply topically daily. As needed 15 g 2   mupirocin cream (BACTROBAN) 2 % Apply 1 application. topically 2 (two) times daily. 30 g 1   OVER THE COUNTER MEDICATION Apply 1 application topically as needed (pain). Hemp lotion     XIIDRA 5 % SOLN Apply 1 drop to eye 2 (two) times daily.     No current facility-administered medications on file prior to visit.    BP 138/88   Pulse 80   Temp 98.4 F (36.9 C)   Resp 18   Ht '4\' 11"'$  (1.499 m)    Wt 121 lb 6.4 oz (55.1 kg)   SpO2 100%   BMI 24.52 kg/m        Objective:   Physical Exam   General- No acute distress. Pleasant patient. Neck- Full range of motion, no jvd Lungs- Clear, even and unlabored. Heart- regular rate and rhythm. Neurologic- CNII- XII grossly intact.  Abdomen- soft, nontender, +bs, no reound or guarding. No organomegally. Back - no cva tenderness.      Assessment & Plan:   Patient Instructions  Nausea but no vomiting. No abdomen pain  on exam and no urinary symptoms. On discussion some occasional belching. Will get cbc, cmp and lipase labs.  For fatigue b12, b1 and tsh.  Rx zofran 4 mg every 8 hours prn nausea. You could try 8 mg dose to see if helps.  Hydrate well and bland diet.  Rx famotadine as trial.  For mood and anxiety continue with wellbutrin and clonazepam. If labs negative and not responding to above then stick to dosing regimen of wellbutrin and clonazepam as Dr. Charlett Blake advised.   Follow up 10 days or sooner if needed.    Mackie Pai, PA-C

## 2022-09-17 ENCOUNTER — Ambulatory Visit: Payer: Medicare HMO

## 2022-09-17 ENCOUNTER — Encounter: Payer: Self-pay | Admitting: Family Medicine

## 2022-09-17 LAB — COMPREHENSIVE METABOLIC PANEL
ALT: 8 U/L (ref 0–35)
AST: 14 U/L (ref 0–37)
Albumin: 4.3 g/dL (ref 3.5–5.2)
Alkaline Phosphatase: 50 U/L (ref 39–117)
BUN: 18 mg/dL (ref 6–23)
CO2: 29 mEq/L (ref 19–32)
Calcium: 9.3 mg/dL (ref 8.4–10.5)
Chloride: 105 mEq/L (ref 96–112)
Creatinine, Ser: 0.84 mg/dL (ref 0.40–1.20)
GFR: 69.55 mL/min (ref >60.00)
Glucose, Bld: 100 mg/dL — ABNORMAL HIGH (ref 70–99)
Potassium: 5.1 mEq/L (ref 3.5–5.1)
Sodium: 141 mEq/L (ref 135–145)
Total Bilirubin: 0.3 mg/dL (ref 0.2–1.2)
Total Protein: 6.8 g/dL (ref 6.0–8.3)

## 2022-09-17 LAB — CBC WITH DIFFERENTIAL/PLATELET
Basophils Absolute: 0.1 10*3/uL (ref 0.0–0.1)
Basophils Relative: 1 % (ref 0.0–3.0)
Eosinophils Absolute: 0.1 10*3/uL (ref 0.0–0.7)
Eosinophils Relative: 1.4 % (ref 0.0–5.0)
HCT: 40.5 % (ref 36.0–46.0)
Hemoglobin: 13.3 g/dL (ref 12.0–15.0)
Lymphocytes Relative: 31.7 % (ref 12.0–46.0)
Lymphs Abs: 1.8 10*3/uL (ref 0.7–4.0)
MCHC: 32.9 g/dL (ref 30.0–36.0)
MCV: 92.7 fl (ref 78.0–100.0)
Monocytes Absolute: 0.4 10*3/uL (ref 0.1–1.0)
Monocytes Relative: 7.3 % (ref 3.0–12.0)
Neutro Abs: 3.4 10*3/uL (ref 1.4–7.7)
Neutrophils Relative %: 58.6 % (ref 43.0–77.0)
Platelets: 222 10*3/uL (ref 150.0–400.0)
RBC: 4.37 Mil/uL (ref 3.87–5.11)
RDW: 14.1 % (ref 11.5–15.5)
WBC: 5.8 10*3/uL (ref 4.0–10.5)

## 2022-09-17 LAB — LIPASE: Lipase: 330 U/L — ABNORMAL HIGH (ref 11.0–59.0)

## 2022-09-17 NOTE — Addendum Note (Signed)
Addended by: Anabel Halon on: 09/17/2022 11:51 AM   Modules accepted: Orders

## 2022-09-17 NOTE — Addendum Note (Signed)
Addended by: Anabel Halon on: 09/17/2022 04:34 PM   Modules accepted: Orders

## 2022-09-18 ENCOUNTER — Ambulatory Visit (INDEPENDENT_AMBULATORY_CARE_PROVIDER_SITE_OTHER): Payer: Medicare HMO

## 2022-09-18 ENCOUNTER — Encounter: Payer: Self-pay | Admitting: Medical

## 2022-09-18 DIAGNOSIS — K861 Other chronic pancreatitis: Secondary | ICD-10-CM

## 2022-09-18 DIAGNOSIS — K5730 Diverticulosis of large intestine without perforation or abscess without bleeding: Secondary | ICD-10-CM | POA: Diagnosis not present

## 2022-09-18 DIAGNOSIS — K859 Acute pancreatitis without necrosis or infection, unspecified: Secondary | ICD-10-CM | POA: Diagnosis not present

## 2022-09-18 DIAGNOSIS — R110 Nausea: Secondary | ICD-10-CM | POA: Diagnosis not present

## 2022-09-22 ENCOUNTER — Ambulatory Visit (INDEPENDENT_AMBULATORY_CARE_PROVIDER_SITE_OTHER): Payer: Medicare HMO | Admitting: Medical

## 2022-09-22 ENCOUNTER — Telehealth: Payer: Self-pay | Admitting: Gastroenterology

## 2022-09-22 ENCOUNTER — Encounter: Payer: Self-pay | Admitting: Medical

## 2022-09-22 VITALS — BP 126/86 | HR 80 | Temp 98.4°F | Resp 18 | Ht 59.0 in | Wt 122.2 lb

## 2022-09-22 DIAGNOSIS — R110 Nausea: Secondary | ICD-10-CM

## 2022-09-22 DIAGNOSIS — K861 Other chronic pancreatitis: Secondary | ICD-10-CM

## 2022-09-22 LAB — COMPREHENSIVE METABOLIC PANEL
ALT: 10 U/L (ref 0–35)
AST: 15 U/L (ref 0–37)
Albumin: 4 g/dL (ref 3.5–5.2)
Alkaline Phosphatase: 43 U/L (ref 39–117)
BUN: 17 mg/dL (ref 6–23)
CO2: 33 mEq/L — ABNORMAL HIGH (ref 19–32)
Calcium: 9.3 mg/dL (ref 8.4–10.5)
Chloride: 103 mEq/L (ref 96–112)
Creatinine, Ser: 0.99 mg/dL (ref 0.40–1.20)
GFR: 57.1 mL/min — ABNORMAL LOW (ref >60.00)
Glucose, Bld: 85 mg/dL (ref 70–99)
Potassium: 4.6 mEq/L (ref 3.5–5.1)
Sodium: 140 mEq/L (ref 135–145)
Total Bilirubin: 0.4 mg/dL (ref 0.2–1.2)
Total Protein: 6.7 g/dL (ref 6.0–8.3)

## 2022-09-22 LAB — CBC WITH DIFFERENTIAL/PLATELET
Basophils Absolute: 0 10*3/uL (ref 0.0–0.1)
Basophils Relative: 0.6 % (ref 0.0–3.0)
Eosinophils Absolute: 0.1 10*3/uL (ref 0.0–0.7)
Eosinophils Relative: 1.2 % (ref 0.0–5.0)
HCT: 37.9 % (ref 36.0–46.0)
Hemoglobin: 12.4 g/dL (ref 12.0–15.0)
Lymphocytes Relative: 33 % (ref 12.0–46.0)
Lymphs Abs: 1.7 10*3/uL (ref 0.7–4.0)
MCHC: 32.7 g/dL (ref 30.0–36.0)
MCV: 92.4 fl (ref 78.0–100.0)
Monocytes Absolute: 0.4 10*3/uL (ref 0.1–1.0)
Monocytes Relative: 7.5 % (ref 3.0–12.0)
Neutro Abs: 3 10*3/uL (ref 1.4–7.7)
Neutrophils Relative %: 57.7 % (ref 43.0–77.0)
Platelets: 194 10*3/uL (ref 150.0–400.0)
RBC: 4.11 Mil/uL (ref 3.87–5.11)
RDW: 13.7 % (ref 11.5–15.5)
WBC: 5.2 10*3/uL (ref 4.0–10.5)

## 2022-09-22 LAB — LIPASE: Lipase: 53 U/L (ref 11.0–59.0)

## 2022-09-22 MED ORDER — OMEPRAZOLE 40 MG PO CPDR: 40.0000 mg | DELAYED_RELEASE_CAPSULE | Freq: Every day | ORAL | 3 refills | Status: DC

## 2022-09-22 MED ORDER — ONDANSETRON HCL 4 MG PO TABS: 4.0000 mg | ORAL_TABLET | Freq: Three times a day (TID) | ORAL | 1 refills | Status: DC | PRN

## 2022-09-22 NOTE — Patient Instructions (Signed)
For recent month of nausea with remote hx of gerd recommend bland pancreatitis diet as we discussed. Refilling your zofran today. Rx omeprazaole to use in am and continue famotadine 20 mg at night. Will repeat cbc, cmp and lipase.  Decided to go ahead and put in referral to [facility] today. Ask that you call them by Wednesday morning to see if you can be scheduled. Update me on appointment date and how you are doing.  Follow up date to be determined on lab result and you appt date.  Keep regular appointment with pcp.

## 2022-09-22 NOTE — Telephone Encounter (Signed)
Urgent referral in [place] from PCP for month of constant nausea with elevated lipase. Pt has intermittent on and off symptoms improvement with recurrent nause for months intermittetntly. Hx of remote severe gerd 15 years ago. Back then got egd. Ct scan showed normal pancrease. Please advise urgency and scheduling?

## 2022-09-22 NOTE — Progress Notes (Signed)
Subjective:    Patient ID: Stephanie Barnes, female    DOB: November 26, 1950, 71 y.o.   MRN: [id number]  HPI  Pt in for follow up.  Pt states she is feeling about the same. See last visit note.  Pt states Friday evening she states felt normal. Saturday felt ok and then Sunday she was ok but mild nausea came back on sundary afternoon. Today she states very nasuea. No vomiting.  Ct abd pelvis after saw lipase was elevated showed the below.   IMPRESSION: 1. No acute noncontrast CT findings of the abdomen or pelvis. No noncontrast CT evidence of acute pancreatitis. 2. Occasional sigmoid diverticula without evidence of acute diverticulitis.  Pt lipase showed 330. Pt states more than 3 weeks since any alcohol. When does drink it is only one beer. She states she is a light weight.  Pt states on review for months up started to ear poorly after April when friednpassed.  Pt is taking clonzepam and wellbutin as pcp advised and not making a difference.  Pt did start famotadine as I had rx'd. Also using zofran. Years ago remote history of gerd. Had egd 15 years ago.   Review of Systems  Constitutional:  Negative for chills, fatigue and fever.  HENT:  Negative for congestion, ear discharge and ear pain.   Respiratory:  Negative for cough, choking, shortness of breath and wheezing.   Cardiovascular:  Negative for chest pain and palpitations.  Gastrointestinal:  Positive for abdominal pain and nausea. Negative for abdominal distention, anal bleeding, constipation, diarrhea and vomiting.  Genitourinary:  Negative for difficulty urinating, dysuria, flank pain and frequency.  Musculoskeletal:  Negative for back pain, myalgias and neck stiffness.  Skin:  Negative for pallor and rash.  Neurological:  Negative for dizziness and headaches.  Hematological:  Negative for adenopathy. Does not bruise/bleed easily.  Psychiatric/Behavioral:  Negative for behavioral problems and decreased concentration.      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   Welcome to Medicare preventive visit 10/02/2015     Social History   Socioeconomic History   Marital status: Single    Spouse name: Not on file   Number of children: Not on file   Years of education: Not on file   Highest education level: Not on file  Occupational History   Not on file  Tobacco Use   Smoking status: Never   Smokeless tobacco: Never  Vaping Use   Vaping Use: Never used  Substance and Sexual Activity   Alcohol use: Yes    Alcohol/week: 1.0 standard drink of alcohol    Types: 1 Glasses of wine per week    Comment: per week   Drug use: No   Sexual activity: Not Currently    Comment: lives alone, retired from [employer], no dietary restrictions  Other Topics Concern   Not on file  Social History Narrative   Not on file   Social Determinants of Health   Financial  Resource Strain: Low Risk  (04/24/2021)   Overall Financial Resource Strain (CARDIA)    Difficulty of Paying Living Expenses: Not hard at all  Food Insecurity: No Food Insecurity (04/24/2021)   Hunger Vital Sign    Worried About Running Out of Food in the Last Year: Never true    Ran Out of Food in the Last Year: Never true  Transportation Needs: No Transportation Needs (04/24/2021)   PRAPARE - Hydrologist (Medical): No    Lack of Transportation (Non-Medical): No  Physical Activity: Insufficiently Active (04/24/2021)   Exercise Vital Sign    Days of Exercise per Week: 1 day     Minutes of Exercise per Session: 60 min  Stress: No Stress Concern Present (04/24/2021)   [place]    Feeling of Stress : Not at all  Social Connections: Socially Isolated (04/24/2021)   Social Connection and Isolation Panel [NHANES]    Frequency of Communication with Friends and Family: More than three times a week    Frequency of Social Gatherings with Friends and Family: Once a week    Attends Religious Services: Never    Marine scientist or Organizations: No    Attends Archivist Meetings: Never    Marital Status: Never married  Intimate Partner Violence: Not At Risk (04/24/2021)   Humiliation, Afraid, Rape, and Kick questionnaire    Fear of Current or Ex-Partner: No    Emotionally Abused: No    Physically Abused: No    Sexually Abused: No    Past Surgical History:  Procedure Laterality Date   COLONOSCOPY  03/31/2014   Dr.Mann-polyp   DENTAL SURGERY     Root cleaning    LASIK     MOUTH SURGERY  08/10/2017   front lower   OPEN REDUCTION INTERNAL FIXATION (ORIF) DISTAL RADIAL FRACTURE Left 03/15/2019   Procedure: OPEN REDUCTION INTERNAL FIXATION (ORIF) DISTAL RADIAL FRACTURE;  Surgeon: Roseanne Kaufman, MD;  Location: [facility];  Service: Orthopedics;  Laterality: Left;   REFRACTIVE SURGERY Bilateral 2000   REVERSE SHOULDER ARTHROPLASTY Left 03/15/2019   Procedure: REVERSE SHOULDER ARTHROPLASTY;  Surgeon: Nicholes Stairs, MD;  Location: [place];  Service: Orthopedics;  Laterality: Left;  2100mn   ROTATOR CUFF REPAIR Right 2017   TONSILLECTOMY      Family History  Problem Relation Age of Onset   Heart disease Mother    Arthritis Mother    Heart disease Father    COPD Father    Colon polyps Father    Mental illness Brother        bipolar   Hypertension Brother    Suicidality Brother    Alcohol abuse Brother    Heart disease Maternal Aunt    Heart disease Maternal Uncle    Heart disease  Paternal Aunt    COPD Paternal Aunt    Heart disease Paternal Uncle    Heart disease Maternal Grandmother    Heart disease Maternal Grandfather    Heart disease Paternal Grandmother    Heart disease Paternal Grandfather    Colon cancer Neg Hx    Esophageal cancer Neg Hx    Stomach cancer Neg Hx    Rectal cancer Neg Hx     Allergies  Allergen Reactions   Aspirin Anaphylaxis and Swelling    Swelling of lips and fingers    Celebrex [Celecoxib] Anaphylaxis, Hives and Shortness Of Breath   Doxycycline Other (See  Comments)    Scarred throat    Prednisone Shortness Of Breath and Other (See Comments)    Chest tightness    Fosamax [Alendronate Sodium] Hives   Other Hives and Nausea Only    Arthritec   Amitriptyline     UNSPECIFIED REACTION    Chlorhexidine     UNSPECIFIED REACTION    Celexa [Citalopram] Other (See Comments)    Shakiness    Clindamycin/Lincomycin Nausea And Vomiting   Tape Itching and Other (See Comments)    UNSPECIFIED REACTION  Paper tape is okay to use per patient    Current Outpatient Medications on File Prior to Visit  Medication Sig Dispense Refill   buPROPion (WELLBUTRIN) 75 MG tablet Take 1 tablet (75 mg total) by mouth 2 (two) times daily. 180 tablet 1   Calcium-Magnesium 500-250 MG TABS Take 1 tablet by mouth in the morning and at bedtime.     cetirizine (ZYRTEC) 10 MG tablet Take 10 mg by mouth daily as needed for allergies.      clonazePAM (KLONOPIN) 1 MG tablet Take 0.5-1 tablets (0.5-1 mg total) by mouth 3 (three) times daily as needed for anxiety. 180 tablet 1   famotidine (PEPCID) 20 MG tablet Take 1 tablet (20 mg total) by mouth daily. 30 tablet 0   fluticasone (FLONASE) 50 MCG/ACT nasal spray Place 2 sprays into both nostrils daily. 1 g 0   Krill Oil 1000 MG CAPS Take 1 capsule by mouth daily.     Melatonin-Pyridoxine (MELATONEX) 3-10 MG TBCR Take 1 tablet by mouth at bedtime.     mometasone (ELOCON) 0.1 % ointment Apply topically daily. As  needed 15 g 2   mupirocin cream (BACTROBAN) 2 % Apply 1 application. topically 2 (two) times daily. 30 g 1   ondansetron (ZOFRAN) 4 MG tablet Take 1 tablet (4 mg total) by mouth every 8 (eight) hours as needed for nausea or vomiting. 20 tablet 0   OVER THE COUNTER MEDICATION Apply 1 application topically as needed (pain). Hemp lotion     XIIDRA 5 % SOLN Apply 1 drop to eye 2 (two) times daily.     No current facility-administered medications on file prior to visit.    BP 126/86   Pulse 80   Temp 98.4 F (36.9 C)   Resp 18   Ht '4\' 11"'$  (1.499 m)   Wt 122 lb 3.2 oz (55.4 kg)   SpO2 99%   BMI 24.68 kg/m        Objective:   Physical Exam  General- No acute distress. Pleasant patient. Neck- Full range of motion, no jvd Lungs- Clear, even and unlabored. Heart- regular rate and rhythm. Neurologic- CNII- XII grossly intact.  Abdomen-       Assessment & Plan:   Patient Instructions  For recent month of nausea with remote hx of gerd recommend bland pancreatitis diet as we discussed. Refilling your zofran today. Rx omeprazaole to use in am and continue famotadine 20 mg at night. Will repeat cbc, cmp and lipase.  Decided to go ahead and put in referral to [facility] today. Ask that you call them by Wednesday morning to see if you can be scheduled. Update me on appointment date and how you are doing.  Follow up date to be determined on lab result and you appt date.  Keep regular appointment with pcp.   Mackie Pai, PA-C

## 2022-09-23 ENCOUNTER — Other Ambulatory Visit: Payer: Self-pay

## 2022-09-23 DIAGNOSIS — K8500 Idiopathic acute pancreatitis without necrosis or infection: Secondary | ICD-10-CM

## 2022-09-23 NOTE — Telephone Encounter (Signed)
Patient is advised and agrees to this plan of care. Order for MRCP placed and scheduling dept notified. Appt.here 10/27/22.

## 2022-09-23 NOTE — Telephone Encounter (Signed)
Lipase level has improved, please advise patient to continue with increased fluid intake to maintain hydration. Possible med induced acute pancreatitis, bupropion can potentially cause it.  Please advise patient to check with the prescribing provider if she can hold taking it Schedule MRCP to exclude any pancreatic lesions in 4 to 6 weeks Please schedule next available appointment with APP.  Thank you

## 2022-09-28 NOTE — Assessment & Plan Note (Deleted)
Continues to work hard to manage her symptoms and stay active

## 2022-09-28 NOTE — Assessment & Plan Note (Deleted)
Continues to struggle with numerous family and personal struggles

## 2022-09-28 NOTE — Assessment & Plan Note (Deleted)
Supplement and monitor 

## 2022-09-28 NOTE — Assessment & Plan Note (Deleted)
Encouraged good sleep hygiene such as dark, quiet room. No blue/green glowing lights such as computer screens in bedroom. No alcohol or stimulants in evening. Cut down on caffeine as able. Regular exercise is helpful but not just prior to bed time.  

## 2022-09-29 ENCOUNTER — Ambulatory Visit: Payer: Medicare HMO | Admitting: Family Medicine

## 2022-09-29 DIAGNOSIS — G4700 Insomnia, unspecified: Secondary | ICD-10-CM

## 2022-09-29 DIAGNOSIS — F418 Other specified anxiety disorders: Secondary | ICD-10-CM

## 2022-09-29 DIAGNOSIS — E559 Vitamin D deficiency, unspecified: Secondary | ICD-10-CM

## 2022-09-29 DIAGNOSIS — G90513 Complex regional pain syndrome I of upper limb, bilateral: Secondary | ICD-10-CM

## 2022-10-06 NOTE — Telephone Encounter (Signed)
Called pt made appt 12/18/22

## 2022-10-06 NOTE — Telephone Encounter (Signed)
Pt returned call. I wasn't sure you if you were planning to sneak the patient into a spot to follow up with [facility]. Pt states she can reached by phone or by Mychart

## 2022-10-06 NOTE — Telephone Encounter (Signed)
Called pt Lvm to call our office to make appt for follow up

## 2022-10-09 ENCOUNTER — Encounter: Payer: Self-pay | Admitting: Physician Assistant

## 2022-10-09 ENCOUNTER — Other Ambulatory Visit: Payer: Self-pay | Admitting: Medical

## 2022-10-09 ENCOUNTER — Ambulatory Visit: Payer: Medicare HMO | Admitting: Physician Assistant

## 2022-10-09 VITALS — BP 120/80 | HR 70 | Ht 59.0 in | Wt 122.2 lb

## 2022-10-09 DIAGNOSIS — K219 Gastro-esophageal reflux disease without esophagitis: Secondary | ICD-10-CM | POA: Diagnosis not present

## 2022-10-09 DIAGNOSIS — R110 Nausea: Secondary | ICD-10-CM | POA: Diagnosis not present

## 2022-10-09 DIAGNOSIS — R634 Abnormal weight loss: Secondary | ICD-10-CM

## 2022-10-09 NOTE — Patient Instructions (Signed)
Please let us know if you are currently taking Pepcid or Omeprazole.   Call office and ask for Amy's nurse - [facility]) if you have recurrent symptoms.   Thank you for choosing me and [facility].  Amy Esterwood PA

## 2022-10-09 NOTE — Progress Notes (Signed)
Subjective:    Patient ID: Stephanie Barnes, female    DOB: 03/27/1951, 71 y.o.   MRN: [id number]  HPI [name] is a pleasant 71 year old white female, established with Dr. Roland Rack who is referred back today by primary care/Ed Saguier PA-C for further evaluation of recent prolonged nausea, and recent labs with an evaded lipase. Patient was last seen here in May 2022 when she had colonoscopy with removal of 3 polyps, all 3 to 8 mm in size and path confirmed tubular adenomas.  She is indicated for 3-year interval follow-up.  She does have history of GERD, diverticulosis, chronic regional pain syndrome of the upper extremity, cervical disc disease, osteoarthritis and degenerative disc disease.  Patient says that she has had an extremely stressful year.  She has been on Wellbutrin for years but says in January 2023 she asked to have her dose increased due to significant stress dealing with her daughter who was bipolar, and with history of EtOH abuse.  Her daughter unfortunately died in April 1751 due to complications from alcohol and at that time her dose of Wellbutrin was increased again. She had been caring for her daughters son.  She says she started having nausea probably 4 to 5 months ago she thought was just due to stress.  However it had persisted and she said she was generally feeling poorly not having any vomiting but not eating very much and had lost some weight.  No changes with bowel movements, no melena or hematochezia.  She had been using Zofran as needed. She was seen by primary care in early November due to the above symptoms and had labs done on 09/16/2022 showing a normal CBC, c-Met normal with completely low normal LFTs but noted lipase of 330.  She was then sent for CT of the abdomen pelvis on 09/18/2022 done without contrast that showed a normal-appearing pancreas, no evidence of pancreatitis normal-appearing gallbladder , no ductal dilation and occasional sigmoid  diverticulosis.  Labs were repeated on 09/22/2022, lipase entirely normal at 53, LFTs remain normal CBC normal .  Patient was asked to stop her Wellbutrin about 2 weeks ago to see if that was contributing to her symptoms.  She says she was reluctant to stop it but within 3 to 4 days of stopping the Wellbutrin she started to feel better and has continued to improve over the day since then.  She says at this point the nausea has completely resolved and she is able to eat much better she has never had any abdominal pain. Fortunately her stress has also improved over the past couple months, her grandson is now living with his dad, and she says she is able to handle things much better than she had been over the past year.  She says she was told that she may need an MRI which is why she came for this appointment.  Review of Systems Pertinent positive and negative review of systems were noted in the above HPI section.  All other review of systems was otherwise negative.   Outpatient Encounter Medications as of 10/09/2022  Medication Sig   Calcium-Magnesium 500-250 MG TABS Take 1 tablet by mouth in the morning and at bedtime.   cetirizine (ZYRTEC) 10 MG tablet Take 10 mg by mouth daily as needed for allergies.    clonazePAM (KLONOPIN) 1 MG tablet Take 0.5-1 tablets (0.5-1 mg total) by mouth 3 (three) times daily as needed for anxiety.   fluticasone (FLONASE) 50 MCG/ACT nasal spray Place 2  sprays into both nostrils daily.   Krill Oil 1000 MG CAPS Take 1 capsule by mouth daily.   Melatonin-Pyridoxine (MELATONEX) 3-10 MG TBCR Take 1 tablet by mouth at bedtime.   mometasone (ELOCON) 0.1 % ointment Apply topically daily. As needed   mupirocin cream (BACTROBAN) 2 % Apply 1 application. topically 2 (two) times daily.   omeprazole (PRILOSEC) 40 MG capsule Take 1 capsule (40 mg total) by mouth daily.   ondansetron (ZOFRAN) 4 MG tablet Take 1 tablet (4 mg total) by mouth every 8 (eight) hours as needed for nausea  or vomiting.   OVER THE COUNTER MEDICATION Apply 1 application topically as needed (pain). Hemp lotion   XIIDRA 5 % SOLN Apply 1 drop to eye 2 (two) times daily.   [DISCONTINUED] famotidine (PEPCID) 20 MG tablet Take 1 tablet (20 mg total) by mouth daily.   buPROPion (WELLBUTRIN) 75 MG tablet Take 1 tablet (75 mg total) by mouth 2 (two) times daily.   No facility-administered encounter medications on file as of 10/09/2022.   Allergies  Allergen Reactions   Aspirin Anaphylaxis and Swelling    Swelling of lips and fingers    Celebrex [Celecoxib] Anaphylaxis, Hives and Shortness Of Breath   Doxycycline Other (See Comments)    Scarred throat    Prednisone Shortness Of Breath and Other (See Comments)    Chest tightness    Fosamax [Alendronate Sodium] Hives   Other Hives and Nausea Only    Arthritec   Amitriptyline     UNSPECIFIED REACTION    Chlorhexidine     UNSPECIFIED REACTION    Celexa [Citalopram] Other (See Comments)    Shakiness    Clindamycin/Lincomycin Nausea And Vomiting   Tape Itching and Other (See Comments)    UNSPECIFIED REACTION  Paper tape is okay to use per patient   Patient Active Problem List   Diagnosis Date Noted   Nausea in adult 11/29/2021   Diverticulosis 03/25/2021   Sinusitis 12/17/2020   Weight loss 08/08/2019   Closed left arm fracture 06/08/2019   Closed fracture of left proximal humerus 03/15/2019   Vitamin D deficiency 12/07/2018   Cervical cancer screening 12/07/2018   Dense breast tissue on mammogram 04/13/2018   Chronic shoulder pain (Location of Primary Source of Pain) (Right) 09/04/2016   Chronic upper extremity pain (Right) 09/04/2016   Neuropathic pain of shoulder (Right) 09/04/2016   Spondylosis of cervical spine (C5-6 and C6-7) 09/04/2016   Cervical DDD (degenerative disc disease) (C5-6 and C6-7) 09/04/2016   Osteoarthritis of right AC (acromioclavicular) joint 09/04/2016   Abnormal MRI, cervical spine (07/23/2015) 09/04/2016    Abnormal MRI, shoulder (08/06/2015) 09/04/2016   Subdeltoid bursitis of right shoulder joint 09/04/2016   Subacromial bursitis of right shoulder joint 09/04/2016   Labral tear of shoulder, right, sequela 09/04/2016   Synovitis involving glenohumeral joint, right 09/04/2016   Biceps tendinopathy, right 09/04/2016   Infraspinatus tendon tear, right, sequela 09/04/2016   Supraspinatus tendon tear, right, sequela 09/04/2016   Allergies 03/31/2016   Anemia 01/13/2016   CRPS (complex regional pain syndrome), upper limb 01/01/2016   Preventative health care 10/02/2015   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   Cervical disc disorder with radiculopathy of cervical region 07/17/2015   Colon polyp   tubular adenoma  03/2014 03/22/2014   Hyperlipidemia 06/13/2013   Osteopenia 06/13/2013   Insomnia 06/09/2013   Depression with anxiety 12/02/2012   DJD (degenerative joint disease) 12/02/2012   GERD (gastroesophageal reflux disease) 12/02/2012   Eczema 12/02/2012  Tendonitis of right hand 12/02/2012   Social History   Socioeconomic History   Marital status: Single    Spouse name: Not on file   Number of children: Not on file   Years of education: Not on file   Highest education level: Not on file  Occupational History   Not on file  Tobacco Use   Smoking status: Never   Smokeless tobacco: Never  Vaping Use   Vaping Use: Never used  Substance and Sexual Activity   Alcohol use: Yes    Alcohol/week: 1.0 standard drink of alcohol    Types: 1 Glasses of wine per week    Comment: per week   Drug use: No   Sexual activity: Not Currently    Comment: lives alone, retired from [employer], no dietary restrictions  Other Topics Concern   Not on file  Social History Narrative   Not on file   Social Determinants of Health   Financial Resource Strain: Low Risk  (04/24/2021)   Overall Financial Resource Strain (CARDIA)    Difficulty of Paying Living Expenses: Not hard at all  Food Insecurity:  No Food Insecurity (04/24/2021)   Hunger Vital Sign    Worried About Running Out of Food in the Last Year: Never true    Ran Out of Food in the Last Year: Never true  Transportation Needs: No Transportation Needs (04/24/2021)   PRAPARE - Hydrologist (Medical): No    Lack of Transportation (Non-Medical): No  Physical Activity: Insufficiently Active (04/24/2021)   Exercise Vital Sign    Days of Exercise per Week: 1 day    Minutes of Exercise per Session: 60 min  Stress: No Stress Concern Present (04/24/2021)   [place]    Feeling of Stress : Not at all  Social Connections: Socially Isolated (04/24/2021)   Social Connection and Isolation Panel [NHANES]    Frequency of Communication with Friends and Family: More than three times a week    Frequency of Social Gatherings with Friends and Family: Once a week    Attends Religious Services: Never    Marine scientist or Organizations: No    Attends Archivist Meetings: Never    Marital Status: Never married  Intimate Partner Violence: Not At Risk (04/24/2021)   Humiliation, Afraid, Rape, and Kick questionnaire    Fear of Current or Ex-Partner: No    Emotionally Abused: No    Physically Abused: No    Sexually Abused: No    Ms. Roland family history includes Alcohol abuse in her brother; Arthritis in her mother; COPD in her father and paternal aunt; Colon polyps in her father; Heart disease in her father, maternal aunt, maternal grandfather, maternal grandmother, maternal uncle, mother, paternal aunt, paternal grandfather, paternal grandmother, and paternal uncle; Hypertension in her brother; Mental illness in her brother; Suicidality in her brother.      Objective:    Vitals:   10/09/22 0858  BP: 120/80  Pulse: 70    Physical Exam Well-developed well-nourished older white female in no acute distress.  Height, Weight,12 2 BMI  24.6  HEENT; nontraumatic normocephalic, EOMI, PE R LA, sclera anicteric. Oropharynx; not examined today Neck; supple, no JVD Cardiovascular; regular rate and rhythm with S1-S2, no murmur rub or gallop Pulmonary; Clear bilaterally Abdomen; soft, nontender, nondistended, no palpable mass or hepatosplenomegaly, bowel sounds are active Rectal; not done today Skin; benign exam, no jaundice  rash or appreciable lesions Extremities; no clubbing cyanosis or edema skin warm and dry Neuro/Psych; alert and oriented x4, grossly nonfocal mood and affect appropriate        Assessment & Plan:   #76 71 year old white female with prolonged nausea, and poor appetite over the past 4 to 5 months. Work-up with CT of the abdomen pelvis without contrast earlier this month was unremarkable with normal-appearing pancreas, normal gallbladder, no ductal dilation.  She had labs done at that same time which were entirely normal with the exception of a lipase at 330.  Labs were repeated about 6 days later and lipase normal at 53 other labs also entirely normal.  She has since come off of Wellbutrin which she has been taking for many years but had escalated her dosage gradually over this past year due to a tremendous amount of stress related to her family.  She has been off of Wellbutrin over the past couple of weeks and feels much better, nausea has completely resolved, she is eating better has never had any associated abdominal pain.  I think that her nausea and poor appetite were a combination of side effects from escalating doses of Wellbutrin, and situational severe stress Do not think she truly had any pancreatitis, and has a normal-appearing pancreas on noncontrasted CT  I do not think she needs to have further imaging at this time as her symptoms have completely resolved, and labs are entirely normal.  #2 history of adenomatous colon polyps-due for follow-up colonoscopy 2025  #3 GERD stable on Prilosec 40  mg p.o. daily-she was not sure whether she is actually been taking Prilosec daily or famotidine daily and will let us know.  #4 diverticulosis  Plan; as above I do not think she needs any further GI work-up at this time, I have advised her that if any of her symptoms recur, regarding nausea poor appetite or she develops any other abdominal symptoms to call and speak to my nurse and we will pursue repeat labs and perhaps further imaging at that time For now she will follow-up with Dr. Roland Rack or myself on an as-needed basis.     Mellanie Bejarano Genia Harold PA-C 10/09/2022   Cc: Mosie Lukes, MD

## 2022-10-10 ENCOUNTER — Other Ambulatory Visit: Payer: Self-pay

## 2022-10-10 ENCOUNTER — Other Ambulatory Visit: Payer: Self-pay | Admitting: Family Medicine

## 2022-10-10 MED ORDER — FAMOTIDINE 20 MG PO TABS: 20.0000 mg | ORAL_TABLET | Freq: Two times a day (BID) | ORAL | 0 refills | Status: DC | PRN

## 2022-10-10 MED ORDER — OMEPRAZOLE 40 MG PO CPDR: 40.0000 mg | DELAYED_RELEASE_CAPSULE | Freq: Every day | ORAL | 3 refills | Status: DC | PRN

## 2022-10-14 ENCOUNTER — Telehealth: Payer: Self-pay | Admitting: Physician Assistant

## 2022-10-14 NOTE — Telephone Encounter (Signed)
Inbound call from patient stating she received a call from radiology to schedule for a MCRI of the pancrease. Patient states her last visit they dicussed her not having the procedure done due to the provider feeling as though she did not need it. Please advise.  Thank you

## 2022-10-14 NOTE — Telephone Encounter (Signed)
The MRI/MRCP was ordered before the decision was made not to do the imaging. Canceled the order. Called the patient to discuss. No answer. Left her a voicemail explaining what had happened.

## 2022-10-16 ENCOUNTER — Encounter: Payer: Self-pay | Admitting: Family Medicine

## 2022-10-27 ENCOUNTER — Telehealth: Payer: Self-pay

## 2022-10-27 NOTE — Telephone Encounter (Signed)
Patient stating she is having nausea. Please advise.

## 2022-10-27 NOTE — Telephone Encounter (Signed)
Patient is out of town in [place] visiting relatives. She was seen by Nicoletta Ba, PA 10/09/22 for chronic nausea. She had been referred to [place] for nausea, weight loss and presumed pancreatitis. She stopped Wellbutrin and seemed to improve. Lipase normalized. She calls today with complaints of a return of her nausea. She had not had any problems in a while. She ate a fried food 3 days ago and became very nauseous. She immediatly switched to liquid diet then bland foods. The nausea has continued. She is going to the pharmacy in [place] to ask for a transfer of her Zofran and famotidine prescriptions. Because she had felt better, she did not bring these medications with her. She hopes to return to [facility] tomorrow.

## 2022-10-28 ENCOUNTER — Ambulatory Visit: Payer: Medicare HMO | Admitting: Nurse Practitioner

## 2022-10-29 NOTE — Telephone Encounter (Signed)
Ok, please schedule next available appt with me or APP. Thanks

## 2022-10-29 NOTE — Telephone Encounter (Signed)
Patient calls back with report that she continues to have nausea daily. No vomiting. The Zofran and famotidine "take the edge off" but it is continuous. She explains the greasy, fried foods were not her normal diet, but a one time thing. She was not having any problems up to that point. She does not know why it changed then. Should she come back in for further evaluation she asks. She has concerns that something "is going on."

## 2022-10-29 NOTE — Telephone Encounter (Signed)
Please advise patient to avoid greasy fried foods.  Use famotidine 20 mg twice daily and okay to send refill for Zofran 4 mg daily as needed X30 tabs with no refills.

## 2022-10-30 NOTE — Telephone Encounter (Signed)
Spoke with the patient. She agrees to this plan. She will continue to take the famotidine and the ondansetron daily as needed. Appointment 11/24/22 at 11:40 am.

## 2022-11-18 ENCOUNTER — Ambulatory Visit
Admission: RE | Admit: 2022-11-18 | Discharge: 2022-11-18 | Disposition: A | Discharge status: Home / Self Care | Payer: Medicare PPO | Source: Ambulatory Visit | Attending: Family Medicine | Admitting: Family Medicine

## 2022-11-18 DIAGNOSIS — Z1231 Encounter for screening mammogram for malignant neoplasm of breast: Secondary | ICD-10-CM

## 2022-11-19 DIAGNOSIS — M1712 Unilateral primary osteoarthritis, left knee: Secondary | ICD-10-CM | POA: Diagnosis not present

## 2022-11-24 ENCOUNTER — Encounter: Payer: Self-pay | Admitting: Gastroenterology

## 2022-11-24 ENCOUNTER — Ambulatory Visit: Payer: Medicare PPO | Admitting: Gastroenterology

## 2022-11-24 VITALS — BP 122/70 | HR 72 | Ht 59.0 in | Wt 126.0 lb

## 2022-11-24 DIAGNOSIS — K219 Gastro-esophageal reflux disease without esophagitis: Secondary | ICD-10-CM | POA: Diagnosis not present

## 2022-11-24 DIAGNOSIS — R1013 Epigastric pain: Secondary | ICD-10-CM | POA: Diagnosis not present

## 2022-11-24 DIAGNOSIS — R110 Nausea: Secondary | ICD-10-CM | POA: Diagnosis not present

## 2022-11-24 MED ORDER — FAMOTIDINE 40 MG PO TABS: 40.0000 mg | ORAL_TABLET | Freq: Two times a day (BID) | ORAL | 3 refills | Status: DC

## 2022-11-24 NOTE — Patient Instructions (Signed)
We have sent the following medications to your pharmacy for you to pick up at your convenience:  Pepcid 40 mg  You have been scheduled for a HIDA scan at [facility] (1st floor) on ___________. Please arrive 30 minutes prior to your scheduled appointment at  ___________. Make certain not to have anything to eat or drink at least 6 hours prior to your test. Should this appointment date or time not work well for you, please call radiology scheduling at [phone].  _____________________________________________________________________ hepatobiliary (HIDA) scan is an imaging procedure used to diagnose problems in the liver, gallbladder and bile ducts. In the HIDA scan, a radioactive chemical or tracer is injected into a vein in your arm. The tracer is handled by the liver like bile. Bile is a fluid produced and excreted by your liver that helps your digestive system break down fats in the foods you eat. Bile is stored in your gallbladder and the gallbladder releases the bile when you eat a meal. A special nuclear medicine scanner (gamma camera) tracks the flow of the tracer from your liver into your gallbladder and small intestine.  During your HIDA scan  You'll be asked to change into a hospital gown before your HIDA scan begins. Your health care team will position you on a table, usually on your back. The radioactive tracer is then injected into a vein in your arm.The tracer travels through your bloodstream to your liver, where it's taken up by the bile-producing cells. The radioactive tracer travels with the bile from your liver into your gallbladder and through your bile ducts to your small intestine.You may feel some pressure while the radioactive tracer is injected into your vein. As you lie on the table, a special gamma camera is positioned over your abdomen taking pictures of the tracer as it moves through your body. The gamma camera takes pictures continually for about an hour. You'll need to  keep still during the HIDA scan. This can become uncomfortable, but you may find that you can lessen the discomfort by taking deep breaths and thinking about other things. Tell your health care team if you're uncomfortable. The radiologist will watch on a computer the progress of the radioactive tracer through your body. The HIDA scan may be stopped when the radioactive tracer is seen in the gallbladder and enters your small intestine. This typically takes about an hour. In some cases extra imaging will be performed if original images aren't satisfactory, if morphine is given to help visualize the gallbladder or if the medication CCK is given to look at the contraction of the gallbladder. This test typically takes 2 hours to complete. ________________________________________________________________________   Stephanie Barnes have been scheduled for an endoscopy. Please follow written instructions given to you at your visit today. If you use inhalers (even only as needed), please bring them with you on the day of your procedure.   Due to recent changes in healthcare laws, you may see the results of your imaging and laboratory studies on MyChart before your provider has had a chance to review them.  We understand that in some cases there may be results that are confusing or concerning to you. Not all laboratory results come back in the same time frame and the provider may be waiting for multiple results in order to interpret others.  Please give [place] 48 hours in order for your provider to thoroughly review all the results before contacting the office for clarification of your results.    _______________________________________________________  You will be contacted by [facility] in the next 2 days to arrange a HIDA SCAN .  The number on your caller ID will be [phone], please answer when they call.  If you have not heard from them in 2 days please call [phone] to schedule.     If your blood  pressure at your visit was 140/90 or greater, please contact your primary care physician to follow up on this.  _______________________________________________________  If you are age 69 or older, your body mass index should be between 23-30. Your Body mass index is 25.45 kg/m. If this is out of the aforementioned range listed, please consider follow up with your Primary Care Provider.  If you are age 70 or younger, your body mass index should be between 19-25. Your Body mass index is 25.45 kg/m. If this is out of the aformentioned range listed, please consider follow up with your Primary Care Provider.   ________________________________________________________  The [facility] providers would like to encourage you to use [facility] to communicate with providers for non-urgent requests or questions.  Due to long hold times on the telephone, sending your provider a message by [facility] may be a faster and more efficient way to get a response.  Please allow 48 business hours for a response.  Please remember that this is for non-urgent requests.  _______________________________________________________   Thank you for choosing [facility]  Kavitha Nandigam,MD

## 2022-11-24 NOTE — Progress Notes (Signed)
Stephanie Barnes    [id number]    [birth year]  Primary Care Physician:Blyth, Bonnita Levan, MD  Referring Physician: Mosie Lukes, MD [address] [place],  [place] [address]   Chief complaint: Nausea, GERD  HPI: 72 year old very pleasant female here for follow-up visit for GERD.  Overall her symptoms are stable.  Nausea is controlled with as needed Zofran and she is taking Pepcid twice daily with improvement of GERD Is having intermittent epigastric abdominal pain no association with activity, sometimes worse after meals Denies any vomiting, melena or bright red blood per rectum  CT abdomen pelvis without contrast September 18, 2022: Sigmoid diverticulosis otherwise negative for any acute pathology  Colonoscopy 03/20/21 Impression:        - Three 3 to 8 mm polyps in the transverse colon,                            removed with a cold snare. Resected and retrieved.                           - Diverticulosis in the sigmoid colon.                           - Non-bleeding internal hemorrhoids.   Outpatient Encounter Medications as of 11/24/2022  Medication Sig   cetirizine (ZYRTEC) 10 MG tablet Take 10 mg by mouth daily as needed for allergies.    clonazePAM (KLONOPIN) 1 MG tablet Take 0.5-1 tablets (0.5-1 mg total) by mouth 3 (three) times daily as needed for anxiety. (Patient taking differently: Take 0.5-1 mg by mouth at bedtime.)   gabapentin (NEURONTIN) 300 MG capsule Take 300 mg by mouth 3 (three) times daily.   fluticasone (FLONASE) 50 MCG/ACT nasal spray Place 2 sprays into both nostrils daily.   Krill Oil 1000 MG CAPS Take 1 capsule by mouth daily. (Patient not taking: Reported on 11/24/2022)   Melatonin-Pyridoxine (MELATONEX) 3-10 MG TBCR Take 1 tablet by mouth at bedtime.   mometasone (ELOCON) 0.1 % ointment Apply topically daily. As needed   mupirocin cream (BACTROBAN) 2 % Apply 1 application. topically 2 (two) times daily.   omeprazole (PRILOSEC) 40  MG capsule Take 1 capsule (40 mg total) by mouth daily as needed. (Patient not taking: Reported on 11/24/2022)   ondansetron (ZOFRAN) 4 MG tablet Take 1 tablet (4 mg total) by mouth every 8 (eight) hours as needed for nausea or vomiting.   OVER THE COUNTER MEDICATION Apply 1 application topically as needed (pain). Hemp lotion   XIIDRA 5 % SOLN Apply 1 drop to eye 2 (two) times daily.   [DISCONTINUED] Calcium-Magnesium 500-250 MG TABS Take 1 tablet by mouth in the morning and at bedtime.   [DISCONTINUED] famotidine (PEPCID) 20 MG tablet Take 1 tablet (20 mg total) by mouth 2 (two) times daily as needed for heartburn or indigestion.   No facility-administered encounter medications on file as of 11/24/2022.    Allergies as of 11/24/2022 - Review Complete 11/24/2022  Allergen Reaction Noted   Aspirin Anaphylaxis and Swelling 12/02/2012   Celebrex [celecoxib] Anaphylaxis, Hives, and Shortness Of Breath 12/02/2012   Doxycycline Other (See Comments) 12/02/2012   Prednisone Shortness Of Breath and Other (See Comments) 12/02/2012   Fosamax [alendronate sodium] Hives 06/13/2013   Other Hives and Nausea  Only 12/02/2012   Amitriptyline  03/07/2019   Chlorhexidine  03/07/2019   Celexa [citalopram] Other (See Comments) 12/02/2012   Clindamycin/lincomycin Nausea And Vomiting 01/16/2013   Tape Itching and Other (See Comments) 03/10/2019    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   Welcome to Medicare preventive visit 10/02/2015    Past Surgical History:   Procedure Laterality Date   COLONOSCOPY  03/31/2014   Dr.Mann-polyp   DENTAL SURGERY     Root cleaning    LASIK     MOUTH SURGERY  08/10/2017   front lower   OPEN REDUCTION INTERNAL FIXATION (ORIF) DISTAL RADIAL FRACTURE Left 03/15/2019   Procedure: OPEN REDUCTION INTERNAL FIXATION (ORIF) DISTAL RADIAL FRACTURE;  Surgeon: Roseanne Kaufman, MD;  Location: [facility];  Service: Orthopedics;  Laterality: Left;   REFRACTIVE SURGERY Bilateral 2000   REVERSE SHOULDER ARTHROPLASTY Left 03/15/2019   Procedure: REVERSE SHOULDER ARTHROPLASTY;  Surgeon: Nicholes Stairs, MD;  Location: [facility];  Service: Orthopedics;  Laterality: Left;  270mn   ROTATOR CUFF REPAIR Right 2017   TONSILLECTOMY      Family History  Problem Relation Age of Onset   Heart disease Mother    Arthritis Mother    Heart disease Father    COPD Father    Colon polyps Father    Mental illness Brother        bipolar   Hypertension Brother    Suicidality Brother    Alcohol abuse Brother    Heart disease Maternal Aunt    Heart disease Maternal Uncle    Heart disease Paternal Aunt    COPD Paternal Aunt    Heart disease Paternal Uncle    Heart disease Maternal Grandmother    Heart disease Maternal Grandfather    Heart disease Paternal Grandmother    Heart disease Paternal Grandfather    Colon cancer Neg Hx    Esophageal cancer Neg Hx    Stomach cancer Neg Hx    Rectal cancer Neg Hx     Social History   Socioeconomic History   Marital status: Single    Spouse name: Not on file   Number of children: Not on file   Years of education: Not on file   Highest education level: Not on file  Occupational History   Not on file  Tobacco Use   Smoking status: Never   Smokeless tobacco: Never  Vaping Use   Vaping Use: Never used  Substance and Sexual Activity   Alcohol use: Yes    Alcohol/week: 1.0 standard drink of alcohol    Types: 1 Glasses of wine per week    Comment: once a month   Drug use: Yes    Types:  Marijuana    Comment: for nausea (every night)   Sexual activity: Not Currently    Comment: lives alone, retired from [employer] no dietary restrictions  Other Topics Concern   Not on file  Social History Narrative   Not  on file   Social Determinants of Health   Financial Resource Strain: Low Risk  (04/24/2021)   Overall Financial Resource Strain (CARDIA)    Difficulty of Paying Living Expenses: Not hard at all  Food Insecurity: No Food Insecurity (04/24/2021)   Hunger Vital Sign    Worried About Running Out of Food in the Last Year: Never true    Ran Out of Food in the Last Year: Never true  Transportation Needs: No Transportation Needs (04/24/2021)   PRAPARE - Hydrologist (Medical): No    Lack of Transportation (Non-Medical): No  Physical Activity: Insufficiently Active (04/24/2021)   Exercise Vital Sign    Days of Exercise per Week: 1 day    Minutes of Exercise per Session: 60 min  Stress: No Stress Concern Present (04/24/2021)   [place]    Feeling of Stress : Not at all  Social Connections: Socially Isolated (04/24/2021)   Social Connection and Isolation Panel [NHANES]    Frequency of Communication with Friends and Family: More than three times a week    Frequency of Social Gatherings with Friends and Family: Once a week    Attends Religious Services: Never    Marine scientist or Organizations: No    Attends Archivist Meetings: Never    Marital Status: Never married  Intimate Partner Violence: Not At Risk (04/24/2021)   Humiliation, Afraid, Rape, and Kick questionnaire    Fear of Current or Ex-Partner: No    Emotionally Abused: No    Physically Abused: No    Sexually Abused: No      Review of systems: All other review of systems negative except as mentioned in the HPI.   Physical Exam: Vitals:   11/24/22 1032  BP: 122/70  Pulse: 72  SpO2: 98%    Body mass index is 25.45 kg/m. Gen:      No acute distress HEENT:  sclera anicteric Abd:      soft, non-tender; no palpable masses, no distension Ext:    No edema Neuro: alert and oriented x 3 Psych: normal mood and affect  Data Reviewed:  Reviewed labs, radiology imaging, old records and pertinent past GI work up   Assessment and Plan/Recommendations:  72 year old very pleasant female with intermittent nausea, epigastric pain and GERD  Will obtain HIDA scan to exclude gallbladder dysfunction causing persistent nausea and epigastric discomfort  GERD: Continue antireflux measures and famotidine twice daily as needed  Return in 3 months   The patient was provided an opportunity to ask questions and all were answered. The patient agreed with the plan and demonstrated an understanding of the instructions.  Damaris Hippo , MD    CC: Mosie Lukes, MD

## 2022-12-03 ENCOUNTER — Encounter (HOSPITAL_COMMUNITY)
Admission: RE | Admit: 2022-12-03 | Discharge: 2022-12-03 | Disposition: A | Discharge status: Home / Self Care | Payer: Medicare PPO | Source: Ambulatory Visit | Attending: Gastroenterology | Admitting: Gastroenterology

## 2022-12-03 DIAGNOSIS — R1013 Epigastric pain: Secondary | ICD-10-CM | POA: Insufficient documentation

## 2022-12-03 DIAGNOSIS — R110 Nausea: Secondary | ICD-10-CM | POA: Insufficient documentation

## 2022-12-03 MED ORDER — TECHNETIUM TC 99M MEBROFENIN IV KIT
5.0100 | PACK | Freq: Once | INTRAVENOUS | Status: AC
  Administered 2022-12-03: 5.01 via INTRAVENOUS

## 2022-12-05 NOTE — Telephone Encounter (Signed)
I believe this message was for GI & not Dr. Charlett Blake

## 2022-12-08 ENCOUNTER — Other Ambulatory Visit: Payer: Self-pay | Admitting: Medical

## 2022-12-10 DIAGNOSIS — H52223 Regular astigmatism, bilateral: Secondary | ICD-10-CM | POA: Diagnosis not present

## 2022-12-10 DIAGNOSIS — H04123 Dry eye syndrome of bilateral lacrimal glands: Secondary | ICD-10-CM | POA: Diagnosis not present

## 2022-12-10 DIAGNOSIS — H43813 Vitreous degeneration, bilateral: Secondary | ICD-10-CM | POA: Diagnosis not present

## 2022-12-10 DIAGNOSIS — H5203 Hypermetropia, bilateral: Secondary | ICD-10-CM | POA: Diagnosis not present

## 2022-12-10 DIAGNOSIS — H524 Presbyopia: Secondary | ICD-10-CM | POA: Diagnosis not present

## 2022-12-10 DIAGNOSIS — H2513 Age-related nuclear cataract, bilateral: Secondary | ICD-10-CM | POA: Diagnosis not present

## 2022-12-10 DIAGNOSIS — H11042 Peripheral pterygium, stationary, left eye: Secondary | ICD-10-CM | POA: Diagnosis not present

## 2022-12-10 NOTE — Telephone Encounter (Signed)
HIDA scan showed normal gallbladder activity.  Agree with trial of lactose-free diet for 2 weeks.  Please schedule next available follow-up office visit.  Thank you

## 2022-12-11 DIAGNOSIS — H04123 Dry eye syndrome of bilateral lacrimal glands: Secondary | ICD-10-CM | POA: Diagnosis not present

## 2022-12-11 DIAGNOSIS — H2513 Age-related nuclear cataract, bilateral: Secondary | ICD-10-CM | POA: Diagnosis not present

## 2022-12-18 ENCOUNTER — Ambulatory Visit: Payer: Medicare HMO | Admitting: Family Medicine

## 2022-12-21 ENCOUNTER — Encounter: Payer: Self-pay | Admitting: Certified Registered Nurse Anesthetist

## 2022-12-23 ENCOUNTER — Encounter: Payer: Self-pay | Admitting: Gastroenterology

## 2022-12-23 ENCOUNTER — Ambulatory Visit (AMBULATORY_SURGERY_CENTER): Payer: Medicare PPO | Admitting: Gastroenterology

## 2022-12-23 ENCOUNTER — Telehealth: Payer: Medicare PPO | Admitting: Family Medicine

## 2022-12-23 VITALS — BP 114/57 | HR 73 | Temp 98.0°F | Resp 11 | Ht 59.0 in | Wt 126.0 lb

## 2022-12-23 DIAGNOSIS — F418 Other specified anxiety disorders: Secondary | ICD-10-CM

## 2022-12-23 DIAGNOSIS — E559 Vitamin D deficiency, unspecified: Secondary | ICD-10-CM | POA: Diagnosis not present

## 2022-12-23 DIAGNOSIS — K31A11 Gastric intestinal metaplasia without dysplasia, involving the antrum: Secondary | ICD-10-CM | POA: Diagnosis not present

## 2022-12-23 DIAGNOSIS — K219 Gastro-esophageal reflux disease without esophagitis: Secondary | ICD-10-CM

## 2022-12-23 DIAGNOSIS — R110 Nausea: Secondary | ICD-10-CM | POA: Diagnosis not present

## 2022-12-23 DIAGNOSIS — G90513 Complex regional pain syndrome I of upper limb, bilateral: Secondary | ICD-10-CM | POA: Diagnosis not present

## 2022-12-23 DIAGNOSIS — E782 Mixed hyperlipidemia: Secondary | ICD-10-CM | POA: Diagnosis not present

## 2022-12-23 MED ORDER — SODIUM CHLORIDE 0.9 % IV SOLN: 500.0000 mL | INTRAVENOUS | Status: DC

## 2022-12-23 NOTE — Progress Notes (Signed)
Report given to PACU, vss 

## 2022-12-23 NOTE — Op Note (Signed)
[facility] Patient Name: Stephanie Barnes Procedure Date: 12/23/2022 10:04 AM MRN: [id number] Endoscopist: Mauri Pole , MD, [id number] Age: 72 Referring MD:  Date of Birth: [birth year] Gender: Female Account #: [id number] Procedure:                Upper GI endoscopy Indications:              Epigastric abdominal pain, Esophageal reflux                            symptoms that persist despite appropriate therapy,                            Nausea Medicines:                Monitored Anesthesia Care Procedure:                Pre-Anesthesia Assessment:                           - Prior to the procedure, a History and Physical                            was performed, and patient medications and                            allergies were reviewed. The patient's tolerance of                            previous anesthesia was also reviewed. The risks                            and benefits of the procedure and the sedation                            options and risks were discussed with the patient.                            All questions were answered, and informed consent                            was obtained. Prior Anticoagulants: The patient has                            taken no anticoagulant or antiplatelet agents. ASA                            Grade Assessment: II - A patient with mild systemic                            disease. After reviewing the risks and benefits,                            the patient was deemed in satisfactory condition to  undergo the procedure.                           After obtaining informed consent, the endoscope was                            passed under direct vision. Throughout the                            procedure, the patient's blood pressure, pulse, and                            oxygen saturations were monitored continuously. The                            Olympus Scope [id number] was introduced  through the                            mouth, and advanced to the second part of duodenum.                            The upper GI endoscopy was accomplished without                            difficulty. The patient tolerated the procedure                            well. Scope In: Scope Out: Findings:                 The Z-line was regular and was found 36 cm from the                            incisors.                           The examined esophagus was normal.                           The gastroesophageal flap valve was visualized                            endoscopically and classified as Hill Grade III                            (minimal fold, loose to endoscope, hiatal hernia                            likely).                           A 1 cm hiatal hernia was present.                           Patchy mildly erythematous mucosa without bleeding  was found in the gastric antrum and in the                            prepyloric region of the stomach. Biopsies were                            taken with a cold forceps for Helicobacter pylori                            testing.                           The cardia and gastric fundus were normal on                            retroflexion.                           Patchy mildly erythematous mucosa without active                            bleeding and with no stigmata of bleeding was found                            in the first portion of the duodenum and in the                            second portion of the duodenum. Biopsies for                            histology were taken with a cold forceps for                            evaluation of celiac disease. Complications:            No immediate complications. Estimated Blood Loss:     Estimated blood loss was minimal. Impression:               - Z-line regular, 36 cm from the incisors.                           - Normal esophagus.                            - Gastroesophageal flap valve classified as Hill                            Grade III (minimal fold, loose to endoscope, hiatal                            hernia likely).                           - 1 cm hiatal hernia.                           -  Erythematous mucosa in the antrum and prepyloric                            region of the stomach. Biopsied.                           - Erythematous duodenopathy. Biopsied. Recommendation:           - Patient has a contact number available for                            emergencies. The signs and symptoms of potential                            delayed complications were discussed with the                            patient. Return to normal activities tomorrow.                            Written discharge instructions were provided to the                            patient.                           - Resume previous diet.                           - Continue present medications.                           - Await pathology results.                           - Follow an antireflux regimen. Mauri Pole, MD 12/23/2022 10:34:48 AM This report has been signed electronically.

## 2022-12-23 NOTE — Patient Instructions (Signed)
Please read handouts provided. Continue present medications. Await pathology results. Follow an antireflux regimen. Resume previous diet.   YOU HAD AN ENDOSCOPIC PROCEDURE TODAY AT [facility]:   Refer to the procedure report that was given to you for any specific questions about what was found during the examination.  If the procedure report does not answer your questions, please call your gastroenterologist to clarify.  If you requested that your care partner not be given the details of your procedure findings, then the procedure report has been included in a sealed envelope for you to review at your convenience later.  YOU SHOULD EXPECT: Some feelings of bloating in the abdomen. Passage of more gas than usual.  Walking can help get rid of the air that was put into your GI tract during the procedure and reduce the bloating. If you had a lower endoscopy (such as a colonoscopy or flexible sigmoidoscopy) you may notice spotting of blood in your stool or on the toilet paper. If you underwent a bowel prep for your procedure, you may not have a normal bowel movement for a few days.  Please Note:  You might notice some irritation and congestion in your nose or some drainage.  This is from the oxygen used during your procedure.  There is no need for concern and it should clear up in a day or so.  SYMPTOMS TO REPORT IMMEDIATELY:  Following upper endoscopy (EGD)  Vomiting of blood or coffee ground material  New chest pain or pain under the shoulder blades  Painful or persistently difficult swallowing  New shortness of breath  Fever of 100F or higher  Black, tarry-looking stools  For urgent or emergent issues, a gastroenterologist can be reached at any hour by calling [phone]. Do not use MyChart messaging for urgent concerns.    DIET:  We do recommend a small meal at first, but then you may proceed to your regular diet.  Drink plenty of fluids but you should avoid alcoholic  beverages for 24 hours.  ACTIVITY:  You should plan to take it easy for the rest of today and you should NOT DRIVE or use heavy machinery until tomorrow (because of the sedation medicines used during the test).    FOLLOW UP: Our staff will call the number listed on your records the next business day following your procedure.  We will call around 7:15- 8:00 am to check on you and address any questions or concerns that you may have regarding the information given to you following your procedure. If we do not reach you, we will leave a message.     If any biopsies were taken you will be contacted by phone or by letter within the next 1-3 weeks.  Please call us at [phone] if you have not heard about the biopsies in 3 weeks.    SIGNATURES/CONFIDENTIALITY: You and/or your care partner have signed paperwork which will be entered into your electronic medical record.  These signatures attest to the fact that that the information above on your After Visit Summary has been reviewed and is understood.  Full responsibility of the confidentiality of this discharge information lies with you and/or your care-partner.

## 2022-12-23 NOTE — Progress Notes (Signed)
[facility] History and Physical   Primary Care Physician:  Mosie Lukes, MD   Reason for Procedure:  Nausea, epigastric discomfort, gerd  Plan:    EGD with possible interventions as needed     HPI: Stephanie Barnes is a very pleasant 72 y.o. female here for persistent nausea and GERD symptoms despite treatment. Intermittent epigastric discomfort..   The risks and benefits as well as alternatives of endoscopic procedure(s) have been discussed and reviewed. All questions answered. The patient agrees to proceed.    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   Welcome to Medicare preventive visit 10/02/2015    Past Surgical History:  Procedure Laterality Date   COLONOSCOPY  03/31/2014   Dr.Mann-polyp   DENTAL SURGERY     Root cleaning    LASIK     MOUTH SURGERY  08/10/2017   front lower   OPEN REDUCTION INTERNAL FIXATION (ORIF) DISTAL RADIAL FRACTURE Left 03/15/2019   Procedure: OPEN REDUCTION INTERNAL FIXATION (ORIF) DISTAL RADIAL FRACTURE;  Surgeon: Roseanne Kaufman, MD;  Location: [place];  Service: Orthopedics;  Laterality: Left;   REFRACTIVE SURGERY Bilateral 2000   REVERSE SHOULDER ARTHROPLASTY Left 03/15/2019   Procedure: REVERSE SHOULDER ARTHROPLASTY;  Surgeon: Nicholes Stairs, MD;  Location: [place];  Service: Orthopedics;  Laterality: Left;  271mn   ROTATOR CUFF REPAIR Right 2017   TONSILLECTOMY      Prior to Admission medications   Medication Sig Start Date End Date Taking? Authorizing Provider  clonazePAM  (KLONOPIN) 1 MG tablet Take 0.5-1 tablets (0.5-1 mg total) by mouth 3 (three) times daily as needed for anxiety. Patient taking differently: Take 0.5-1 mg by mouth at bedtime. 07/04/22  Yes BMosie Lukes MD  famotidine (PEPCID) 40 MG tablet Take 1 tablet (40 mg total) by mouth 2 (two) times daily. 11/24/22  Yes Norwood Quezada, KVenia Minks MD  gabapentin (NEURONTIN) 300 MG capsule Take 300 mg by mouth 3 (three) times daily.   Yes [provider]  Melatonin-Pyridoxine (MELATONEX) 3-10 MG TBCR Take 1 tablet by mouth at bedtime. 08/12/19  Yes [provider]  mometasone (ELOCON) 0.1 % ointment Apply topically daily. As needed 04/01/21  Yes BMosie Lukes MD  XIIDRA 5 % SOLN Apply 1 drop to eye 2 (two) times daily. 02/01/22  Yes [provider]  amoxicillin (AMOXIL) 500 MG tablet TAKE FOUR CAPSULES BY MOUTH ONE HOUR BEFORE APPOINTMENT Patient not taking: Reported on 12/23/2022    [provider]  cetirizine (ZYRTEC) 10 MG tablet Take 10 mg by mouth daily as needed for allergies.     [provider]  fluticasone (FLONASE) 50 MCG/ACT nasal spray Place 2 sprays into both nostrils daily. Patient taking differently: Place 2 sprays into both nostrils daily as needed. 02/06/22 02/06/23  BTerrilyn Saver NP  Krill Oil 1000 MG CAPS Take 1 capsule by mouth daily. 02/10/20   [provider]  mupirocin cream (BACTROBAN) 2 % Apply 1 application. topically  2 (two) times daily. 03/24/22   Saguier, Percell Miller, PA-C  omeprazole (PRILOSEC) 40 MG capsule Take 1 capsule (40 mg total) by mouth daily as needed. Patient not taking: Reported on 11/24/2022 10/10/22   Mosie Lukes, MD  ondansetron (ZOFRAN) 4 MG tablet TAKE 1 TABLET BY MOUTH EVERY 8 HOURS AS NEEDED FOR NAUSEA FOR VOMITING Patient not taking: Reported on 12/23/2022 12/08/22   Saguier, Percell Miller, PA-C    Current Outpatient Medications  Medication Sig Dispense Refill   clonazePAM (KLONOPIN) 1 MG tablet Take 0.5-1 tablets (0.5-1 mg  total) by mouth 3 (three) times daily as needed for anxiety. (Patient taking differently: Take 0.5-1 mg by mouth at bedtime.) 180 tablet 1   famotidine (PEPCID) 40 MG tablet Take 1 tablet (40 mg total) by mouth 2 (two) times daily. 90 tablet 3   gabapentin (NEURONTIN) 300 MG capsule Take 300 mg by mouth 3 (three) times daily.     Melatonin-Pyridoxine (MELATONEX) 3-10 MG TBCR Take 1 tablet by mouth at bedtime.     mometasone (ELOCON) 0.1 % ointment Apply topically daily. As needed 15 g 2   XIIDRA 5 % SOLN Apply 1 drop to eye 2 (two) times daily.     amoxicillin (AMOXIL) 500 MG tablet TAKE FOUR CAPSULES BY MOUTH ONE HOUR BEFORE APPOINTMENT (Patient not taking: Reported on 12/23/2022)     cetirizine (ZYRTEC) 10 MG tablet Take 10 mg by mouth daily as needed for allergies.      fluticasone (FLONASE) 50 MCG/ACT nasal spray Place 2 sprays into both nostrils daily. (Patient taking differently: Place 2 sprays into both nostrils daily as needed.) 1 g 0   Krill Oil 1000 MG CAPS Take 1 capsule by mouth daily.     mupirocin cream (BACTROBAN) 2 % Apply 1 application. topically 2 (two) times daily. 30 g 1   omeprazole (PRILOSEC) 40 MG capsule Take 1 capsule (40 mg total) by mouth daily as needed. (Patient not taking: Reported on 11/24/2022) 30 capsule 3   ondansetron (ZOFRAN) 4 MG tablet TAKE 1 TABLET BY MOUTH EVERY 8 HOURS AS NEEDED FOR NAUSEA FOR VOMITING (Patient not taking: Reported on 12/23/2022) 20 tablet 0   Current Facility-Administered Medications  Medication Dose Route Frequency Provider Last Rate Last Admin   0.9 %  sodium chloride infusion  500 mL Intravenous Continuous Merle Cirelli, Venia Minks, MD        Allergies as of 12/23/2022 - Review Complete 12/23/2022  Allergen Reaction Noted   Aspirin Anaphylaxis and Swelling 12/02/2012   Celebrex [celecoxib] Anaphylaxis, Hives, and Shortness Of Breath 12/02/2012   Doxycycline Other (See Comments) 12/02/2012   Prednisone Shortness Of Breath and Other (See  Comments) 12/02/2012   Fosamax [alendronate sodium] Hives 06/13/2013   Other Hives and Nausea Only 12/02/2012   Amitriptyline  03/07/2019   Chlorhexidine  03/07/2019   Celexa [citalopram] Other (See Comments) 12/02/2012   Clindamycin/lincomycin Nausea And Vomiting 01/16/2013   Tape Itching and Other (See Comments) 03/10/2019    Family History  Problem Relation Age of Onset   Heart disease Mother    Arthritis Mother    Heart disease Father    COPD Father    Colon polyps Father    Mental illness Brother        bipolar   Hypertension Brother    Suicidality Brother    Alcohol abuse Brother    Heart disease Maternal Aunt    Heart disease Maternal Uncle    Heart disease Paternal Aunt  COPD Paternal Aunt    Heart disease Paternal Uncle    Heart disease Maternal Grandmother    Heart disease Maternal Grandfather    Heart disease Paternal Grandmother    Heart disease Paternal Grandfather    Colon cancer Neg Hx    Esophageal cancer Neg Hx    Stomach cancer Neg Hx    Rectal cancer Neg Hx     Social History   Socioeconomic History   Marital status: Single    Spouse name: Not on file   Number of children: Not on file   Years of education: Not on file   Highest education level: Not on file  Occupational History   Not on file  Tobacco Use   Smoking status: Never   Smokeless tobacco: Never  Vaping Use   Vaping Use: Never used  Substance and Sexual Activity   Alcohol use: Yes    Alcohol/week: 1.0 standard drink of alcohol    Types: 1 Glasses of wine per week    Comment: once a month   Drug use: Yes    Types: Marijuana    Comment: for nausea (every night)   Sexual activity: Not Currently    Comment: lives alone, retired from [employer], no dietary restrictions  Other Topics Concern   Not on file  Social History Narrative   Not on file   Social Determinants of Health   Financial Resource Strain: Low Risk  (04/24/2021)   Overall Financial Resource Strain  (CARDIA)    Difficulty of Paying Living Expenses: Not hard at all  Food Insecurity: No Food Insecurity (04/24/2021)   Hunger Vital Sign    Worried About Running Out of Food in the Last Year: Never true    Ran Out of Food in the Last Year: Never true  Transportation Needs: No Transportation Needs (04/24/2021)   PRAPARE - Hydrologist (Medical): No    Lack of Transportation (Non-Medical): No  Physical Activity: Insufficiently Active (04/24/2021)   Exercise Vital Sign    Days of Exercise per Week: 1 day    Minutes of Exercise per Session: 60 min  Stress: No Stress Concern Present (04/24/2021)   [place]    Feeling of Stress : Not at all  Social Connections: Socially Isolated (04/24/2021)   Social Connection and Isolation Panel [NHANES]    Frequency of Communication with Friends and Family: More than three times a week    Frequency of Social Gatherings with Friends and Family: Once a week    Attends Religious Services: Never    Marine scientist or Organizations: No    Attends Archivist Meetings: Never    Marital Status: Never married  Intimate Partner Violence: Not At Risk (04/24/2021)   Humiliation, Afraid, Rape, and Kick questionnaire    Fear of Current or Ex-Partner: No    Emotionally Abused: No    Physically Abused: No    Sexually Abused: No    Review of Systems:  All other review of systems negative except as mentioned in the HPI.  Physical Exam: Vital signs in last 24 hours: Blood Pressure 133/82   Pulse 62   Temperature 98 F (36.7 C)   Respiration 12   Height 4' 11"$  (1.499 m)   Weight 126 lb (57.2 kg)   Oxygen Saturation 100%   Body Mass Index 25.45 kg/m  General:   Alert, NAD Lungs:  Clear .  Heart:  Regular rate and rhythm Abdomen:  Soft, nontender and nondistended. Neuro/Psych:  Alert and cooperative. Normal mood and affect. A and O x 3  Reviewed  labs, radiology imaging, old records and pertinent past GI work up  Patient is appropriate for planned procedure(s) and anesthesia in an ambulatory setting   K. Denzil Magnuson , MD [phone]

## 2022-12-23 NOTE — Progress Notes (Signed)
MyChart Video Visit Virtual Visit via Video Note   This visit type was conducted due to national recommendations for restrictions regarding the COVID-19 Pandemic (e.g. social distancing) in an effort to limit this patient's exposure and mitigate transmission in our community. This patient is at least at moderate risk for complications without adequate follow up. This format is felt to be most appropriate for this patient at this time. Physical exam was limited by quality of the video and audio technology used for the visit. Shamaine, CMA, was able to get the patient set up on a video visit.  Patient location: Patient's home Provider location: Office  I discussed the limitations of evaluation and management by telemedicine and the availability of in person appointments. The patient expressed understanding and agreed to proceed.  Visit Date: 12/23/2022  Today's healthcare provider: Penni Homans, MD     Subjective:    Patient ID: Stephanie Barnes, female    DOB: December 25, 1950, 72 y.o.   MRN: [id number]  Chief Complaint  Patient presents with   Follow-up    Follow up   HPI Patient is in today for a virtual visit. She denies CP/palpitations/SOB/congestion/ fever/chills/or GU symptoms.  Epigastric Abdominal Pain/GERD Patient has been experiencing intermittent epigastric abdominal pain/GERD and has been seeing gastroenterologist Dr. Silverio Decamp to manage this. She reveals today that she had ulcerative colitis while in high school. This morning, 12/23/2022, Dr. Silverio Decamp performed an upper endoscopy. Impression: -1 cm hiatal hernia. -Erythematous mucosa in the antrum and prepyloric region of stomach. Biopsied. -Erythematous duodenopathy. Biopsied.  She continues taking Famotidine 40 mg once daily to manage her GERD and reports that her nausea has improved since stopping Bupropion in 09/2022. She also continues taking Clonazepam 1 mg nightly and Gabapentin 300 mg for the numbness in her left  hand.   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   Welcome to Medicare preventive visit 10/02/2015    Past Surgical History:  Procedure Laterality Date   COLONOSCOPY  03/31/2014   Dr.Mann-polyp   DENTAL SURGERY     Root cleaning    LASIK     MOUTH SURGERY  08/10/2017   front lower   OPEN REDUCTION INTERNAL FIXATION (ORIF) DISTAL RADIAL FRACTURE Left 03/15/2019   Procedure: OPEN REDUCTION INTERNAL FIXATION (ORIF) DISTAL RADIAL FRACTURE;  Surgeon: Roseanne Kaufman, MD;  Location: [place];  Service: Orthopedics;  Laterality: Left;   REFRACTIVE SURGERY Bilateral 2000   REVERSE SHOULDER ARTHROPLASTY Left 03/15/2019   Procedure: REVERSE SHOULDER ARTHROPLASTY;  Surgeon: Nicholes Stairs, MD;  Location: [place];  Service: Orthopedics;  Laterality: Left;  222mn   ROTATOR CUFF REPAIR Right 2017   TONSILLECTOMY      Family History  Problem Relation Age of Onset   Heart disease Mother    Arthritis Mother    Heart disease Father    COPD Father    Colon polyps Father    Mental illness Brother        bipolar  Hypertension Brother    Suicidality Brother    Alcohol abuse Brother    Heart disease Maternal Aunt    Heart disease Maternal Uncle    Heart disease Paternal Aunt    COPD Paternal Aunt    Heart disease Paternal Uncle    Heart disease Maternal Grandmother    Heart disease Maternal Grandfather    Heart disease Paternal Grandmother    Heart disease Paternal Grandfather    Colon cancer Neg Hx    Esophageal cancer Neg Hx    Stomach cancer  Neg Hx    Rectal cancer Neg Hx     Social History   Socioeconomic History   Marital status: Single    Spouse name: Not on file   Number of children: Not on file   Years of education: Not on file   Highest education level: Not on file  Occupational History   Not on file  Tobacco Use   Smoking status: Never   Smokeless tobacco: Never  Vaping Use   Vaping Use: Never used  Substance and Sexual Activity   Alcohol use: Yes    Alcohol/week: 1.0 standard drink of alcohol    Types: 1 Glasses of wine per week    Comment: once a month   Drug use: Yes    Types: Marijuana    Comment: for nausea (every night)   Sexual activity: Not Currently    Comment: lives alone, retired from [employer], no dietary restrictions  Other Topics Concern   Not on file  Social History Narrative   Not on file   Social Determinants of Health   Financial Resource Strain: Low Risk  (04/24/2021)   Overall Financial Resource Strain (CARDIA)    Difficulty of Paying Living Expenses: Not hard at all  Food Insecurity: No Food Insecurity (04/24/2021)   Hunger Vital Sign    Worried About Running Out of Food in the Last Year: Never true    Ran Out of Food in the Last Year: Never true  Transportation Needs: No Transportation Needs (04/24/2021)   PRAPARE - Hydrologist (Medical): No    Lack of Transportation (Non-Medical): No  Physical Activity: Insufficiently Active (04/24/2021)   Exercise Vital Sign    Days of Exercise per Week: 1 day    Minutes of Exercise per Session: 60 min  Stress: No Stress Concern Present (04/24/2021)   [place]    Feeling of Stress : Not at all  Social Connections: Socially Isolated (04/24/2021)   Social Connection and Isolation Panel [NHANES]    Frequency of Communication with Friends and Family: More than three times a week    Frequency of Social Gatherings with Friends and Family:  Once a week    Attends Religious Services: Never    Marine scientist or Organizations: No    Attends Archivist Meetings: Never    Marital Status: Never married  Intimate Partner Violence: Not At Risk (04/24/2021)   Humiliation, Afraid, Rape, and Kick questionnaire    Fear of Current or Ex-Partner: No    Emotionally Abused: No    Physically Abused: No    Sexually Abused: No    Outpatient Medications Prior to Visit  Medication Sig Dispense Refill   cetirizine (ZYRTEC) 10 MG tablet Take 10 mg by mouth daily as needed for allergies.      clonazePAM (KLONOPIN) 1 MG tablet Take 0.5-1 tablets (0.5-1 mg total)  by mouth 3 (three) times daily as needed for anxiety. (Patient taking differently: Take 0.5-1 mg by mouth at bedtime.) 180 tablet 1   famotidine (PEPCID) 40 MG tablet Take 1 tablet (40 mg total) by mouth 2 (two) times daily. 90 tablet 3   fluticasone (FLONASE) 50 MCG/ACT nasal spray Place 2 sprays into both nostrils daily. (Patient taking differently: Place 2 sprays into both nostrils daily as needed.) 1 g 0   gabapentin (NEURONTIN) 300 MG capsule Take 300 mg by mouth 3 (three) times daily.     Krill Oil 1000 MG CAPS Take 1 capsule by mouth daily.     Melatonin-Pyridoxine (MELATONEX) 3-10 MG TBCR Take 1 tablet by mouth at bedtime.     mometasone (ELOCON) 0.1 % ointment Apply topically daily. As needed 15 g 2   mupirocin cream (BACTROBAN) 2 % Apply 1 application. topically 2 (two) times daily. 30 g 1   omeprazole (PRILOSEC) 40 MG capsule Take 1 capsule (40 mg total) by mouth daily as needed. (Patient not taking: Reported on 11/24/2022) 30 capsule 3   ondansetron (ZOFRAN) 4 MG tablet TAKE 1 TABLET BY MOUTH EVERY 8 HOURS AS NEEDED FOR NAUSEA FOR VOMITING (Patient not taking: Reported on 12/23/2022) 20 tablet 0   XIIDRA 5 % SOLN Apply 1 drop to eye 2 (two) times daily.     No facility-administered medications prior to visit.    Allergies  Allergen Reactions   Aspirin  Anaphylaxis and Swelling    Swelling of lips and fingers    Celebrex [Celecoxib] Anaphylaxis, Hives and Shortness Of Breath   Doxycycline Other (See Comments)    Scarred throat    Prednisone Shortness Of Breath and Other (See Comments)    Chest tightness    Fosamax [Alendronate Sodium] Hives   Other Hives and Nausea Only    Arthritec   Amitriptyline     UNSPECIFIED REACTION    Chlorhexidine     UNSPECIFIED REACTION    Celexa [Citalopram] Other (See Comments)    Shakiness    Clindamycin/Lincomycin Nausea And Vomiting   Tape Itching and Other (See Comments)    UNSPECIFIED REACTION  Paper tape is okay to use per patient    Review of Systems  Constitutional:  Negative for chills and fever.  HENT:  Negative for congestion.   Respiratory:  Negative for shortness of breath.   Cardiovascular:  Negative for chest pain and palpitations.  Gastrointestinal:  Positive for abdominal pain (epigastric abdominal pain) and heartburn.  Genitourinary:  Negative for dysuria, frequency, hematuria and urgency.  Skin:           Neurological:  Positive for headaches.       Objective:    Physical Exam  There were no vitals taken for this visit. Wt Readings from Last 3 Encounters:  12/23/22 126 lb (57.2 kg)  11/24/22 126 lb (57.2 kg)  10/09/22 122 lb 4 oz (55.5 kg)    Diabetic Foot Exam - Simple   No data filed    Lab Results  Component Value Date   WBC 5.2 09/22/2022   HGB 12.4 09/22/2022   HCT 37.9 09/22/2022   PLT 194.0 09/22/2022   GLUCOSE 85 09/22/2022   CHOL 222 (H) 03/27/2022   TRIG 79.0 03/27/2022   HDL 86.30 03/27/2022   LDLCALC 120 (H) 03/27/2022   ALT 10 09/22/2022   AST 15 09/22/2022   NA 140 09/22/2022   K 4.6 09/22/2022   CL 103 09/22/2022   CREATININE 0.99  09/22/2022   BUN 17 09/22/2022   CO2 33 (H) 09/22/2022   TSH 1.76 03/27/2022   HGBA1C 5.6 09/26/2021    Lab Results  Component Value Date   TSH 1.76 03/27/2022   Lab Results  Component Value Date    WBC 5.2 09/22/2022   HGB 12.4 09/22/2022   HCT 37.9 09/22/2022   MCV 92.4 09/22/2022   PLT 194.0 09/22/2022   Lab Results  Component Value Date   NA 140 09/22/2022   K 4.6 09/22/2022   CO2 33 (H) 09/22/2022   GLUCOSE 85 09/22/2022   BUN 17 09/22/2022   CREATININE 0.99 09/22/2022   BILITOT 0.4 09/22/2022   ALKPHOS 43 09/22/2022   AST 15 09/22/2022   ALT 10 09/22/2022   PROT 6.7 09/22/2022   ALBUMIN 4.0 09/22/2022   CALCIUM 9.3 09/22/2022   ANIONGAP 14 03/10/2019   GFR 57.10 (L) 09/22/2022   Lab Results  Component Value Date   CHOL 222 (H) 03/27/2022   Lab Results  Component Value Date   HDL 86.30 03/27/2022   Lab Results  Component Value Date   LDLCALC 120 (H) 03/27/2022   Lab Results  Component Value Date   TRIG 79.0 03/27/2022   Lab Results  Component Value Date   CHOLHDL 3 03/27/2022   Lab Results  Component Value Date   HGBA1C 5.6 09/26/2021      Assessment & Plan:   Problem List Items Addressed This Visit     CRPS (complex regional pain syndrome), upper limb    Continues to stay active. She notes Gabapentin and Klonopin help her to manage.       Depression with anxiety    Has been doing well despite stopping her Bupropion. No chances.       GERD (gastroesophageal reflux disease) - Primary    She had an endoscopy today and is noting her reflux is improved some with stopping Bupropion. She is taking Famotidine daily still but not PPI and managing well      Hyperlipidemia    Encourage heart healthy diet such as MIND or DASH diet, increase exercise, avoid trans fats, simple carbohydrates and processed foods, consider a krill or fish or flaxseed oil cap daily.        Vitamin D deficiency    Supplement and monitor       No orders of the defined types were placed in this encounter.  I discussed the assessment and treatment plan with the patient. The patient was provided an opportunity to ask questions and all were answered. The patient agreed  with the plan and demonstrated an understanding of the instructions.   The patient was advised to call back or seek an in-person evaluation if the symptoms worsen or if the condition fails to improve as anticipated.  I provided 20 minutes of face-to-face time during this encounter.  Penni Homans, MD [facility] [place] (phone) [phone] (fax)  [place]

## 2022-12-23 NOTE — Progress Notes (Signed)
Called to room to assist during endoscopic procedure.  Patient ID and intended procedure confirmed with present staff. Received instructions for my participation in the procedure from the performing physician.  

## 2022-12-23 NOTE — Progress Notes (Signed)
Pt's states no medical or surgical changes since previsit or office visit. 

## 2022-12-24 ENCOUNTER — Telehealth: Payer: Self-pay

## 2022-12-24 NOTE — Telephone Encounter (Signed)
  Follow up Call-     12/23/2022    9:30 AM 03/20/2021    8:12 AM  Call back number  Post procedure Call Back phone  # [phone]- [phone]  Permission to leave phone message Yes Yes     Patient questions:  Do you have a fever, pain , or abdominal swelling? No. Pain Score  0 *  Have you tolerated food without any problems? Yes.    Have you been able to return to your normal activities? Yes.    Do you have any questions about your discharge instructions: Diet   No. Medications  No. Follow up visit  No.  Do you have questions or concerns about your Care? No.  Actions: * If pain score is 4 or above: No action needed, pain <4.

## 2022-12-28 NOTE — Assessment & Plan Note (Signed)
She had an endoscopy today and is noting her reflux is improved some with stopping Bupropion. She is taking Famotidine daily still but not PPI and managing well

## 2022-12-28 NOTE — Assessment & Plan Note (Signed)
Continues to stay active. She notes Gabapentin and Klonopin help her to manage.

## 2022-12-28 NOTE — Assessment & Plan Note (Signed)
Has been doing well despite stopping her Bupropion. No chances.

## 2022-12-28 NOTE — Assessment & Plan Note (Signed)
Supplement and monitor 

## 2022-12-28 NOTE — Assessment & Plan Note (Signed)
Encourage heart healthy diet such as MIND or DASH diet, increase exercise, avoid trans fats, simple carbohydrates and processed foods, consider a krill or fish or flaxseed oil cap daily.  °

## 2022-12-30 ENCOUNTER — Encounter: Payer: Self-pay | Admitting: Gastroenterology

## 2022-12-30 DIAGNOSIS — H2513 Age-related nuclear cataract, bilateral: Secondary | ICD-10-CM | POA: Diagnosis not present

## 2022-12-30 DIAGNOSIS — H2512 Age-related nuclear cataract, left eye: Secondary | ICD-10-CM | POA: Diagnosis not present

## 2022-12-31 ENCOUNTER — Telehealth: Payer: Self-pay | Admitting: Gastroenterology

## 2022-12-31 NOTE — Telephone Encounter (Signed)
Inbound call from patient, states she would like to know what steroid was used in her IV during her procedure. Patient states she needs to have eye surgery and the provider is requesting to know what medication was used. Please advise.

## 2023-01-01 NOTE — Telephone Encounter (Signed)
Spoke with the patient. Patient recently had an EGD with Dr Silverio Decamp. Patient thought she heard "someone say they were giving me a steroid when they were putting me to sleep."   The patient has an allergy to steroids. She is having eye surgery. She has told the ophthalmologist she was given steroids and did not have any reaction. Reviewed the patient's procedure and anesthesia reports from the EGD on 12/23/22. No steroid was given. Patient advised. Per patient request, called [facility], spoke with Suzie Portela. Advised we had not used any steroids for the patient.

## 2023-01-01 NOTE — Telephone Encounter (Signed)
Inbound call from patient following up on previous note. Please advise.  Thank you

## 2023-01-06 ENCOUNTER — Telehealth: Payer: Self-pay

## 2023-01-06 NOTE — Telephone Encounter (Signed)
done 

## 2023-01-06 NOTE — Telephone Encounter (Signed)
Called pt and  She stated had gotten it taking care of.

## 2023-01-07 ENCOUNTER — Other Ambulatory Visit: Payer: Self-pay

## 2023-01-07 DIAGNOSIS — E559 Vitamin D deficiency, unspecified: Secondary | ICD-10-CM

## 2023-01-07 DIAGNOSIS — K861 Other chronic pancreatitis: Secondary | ICD-10-CM

## 2023-01-07 DIAGNOSIS — E782 Mixed hyperlipidemia: Secondary | ICD-10-CM

## 2023-01-07 NOTE — Telephone Encounter (Signed)
Called pt was advised regarding labs  And sent labs orders, lab appt made

## 2023-01-08 ENCOUNTER — Other Ambulatory Visit (INDEPENDENT_AMBULATORY_CARE_PROVIDER_SITE_OTHER): Payer: Medicare PPO

## 2023-01-08 DIAGNOSIS — E559 Vitamin D deficiency, unspecified: Secondary | ICD-10-CM | POA: Diagnosis not present

## 2023-01-08 DIAGNOSIS — E782 Mixed hyperlipidemia: Secondary | ICD-10-CM

## 2023-01-08 DIAGNOSIS — K861 Other chronic pancreatitis: Secondary | ICD-10-CM

## 2023-01-08 LAB — COMPREHENSIVE METABOLIC PANEL
ALT: 9 U/L (ref 0–35)
AST: 13 U/L (ref 0–37)
Albumin: 4 g/dL (ref 3.5–5.2)
Alkaline Phosphatase: 55 U/L (ref 39–117)
BUN: 18 mg/dL (ref 6–23)
CO2: 29 mEq/L (ref 19–32)
Calcium: 9.1 mg/dL (ref 8.4–10.5)
Chloride: 102 mEq/L (ref 96–112)
Creatinine, Ser: 0.8 mg/dL (ref 0.40–1.20)
GFR: 73.58 mL/min (ref >60.00)
Glucose, Bld: 140 mg/dL — ABNORMAL HIGH (ref 70–99)
Potassium: 4.1 mEq/L (ref 3.5–5.1)
Sodium: 139 mEq/L (ref 135–145)
Total Bilirubin: 0.3 mg/dL (ref 0.2–1.2)
Total Protein: 6.6 g/dL (ref 6.0–8.3)

## 2023-01-08 LAB — TSH: TSH: 1.23 u[IU]/mL (ref 0.35–5.50)

## 2023-01-08 LAB — LIPID PANEL
Cholesterol: 229 mg/dL — ABNORMAL HIGH (ref 0–200)
HDL: 75.1 mg/dL (ref >39.00)
LDL Cholesterol: 129 mg/dL — ABNORMAL HIGH (ref 0–99)
NonHDL: 154.26
Total CHOL/HDL Ratio: 3
Triglycerides: 128 mg/dL (ref 0.0–149.0)
VLDL: 25.6 mg/dL (ref 0.0–40.0)

## 2023-01-08 LAB — VITAMIN D 25 HYDROXY (VIT D DEFICIENCY, FRACTURES): VITD: 24.05 ng/mL — ABNORMAL LOW (ref 30.00–100.00)

## 2023-01-11 ENCOUNTER — Encounter: Payer: Self-pay | Admitting: Family Medicine

## 2023-01-12 MED ORDER — VITAMIN D (ERGOCALCIFEROL) 1.25 MG (50000 UNIT) PO CAPS: 50000.0000 [IU] | ORAL_CAPSULE | ORAL | 0 refills | Status: DC

## 2023-01-18 ENCOUNTER — Encounter: Payer: Self-pay | Admitting: Family Medicine

## 2023-01-19 ENCOUNTER — Other Ambulatory Visit: Payer: Self-pay

## 2023-01-19 DIAGNOSIS — R112 Nausea with vomiting, unspecified: Secondary | ICD-10-CM

## 2023-01-19 NOTE — Telephone Encounter (Signed)
Called pt was advised, we could referral her to Allergist  For her dairy Allergy testing, Pt agree and referral was placed.

## 2023-01-27 DIAGNOSIS — H269 Unspecified cataract: Secondary | ICD-10-CM | POA: Diagnosis not present

## 2023-01-27 DIAGNOSIS — H5703 Miosis: Secondary | ICD-10-CM | POA: Diagnosis not present

## 2023-01-27 DIAGNOSIS — H2181 Floppy iris syndrome: Secondary | ICD-10-CM | POA: Diagnosis not present

## 2023-01-27 DIAGNOSIS — H2512 Age-related nuclear cataract, left eye: Secondary | ICD-10-CM | POA: Diagnosis not present

## 2023-02-05 DIAGNOSIS — H2511 Age-related nuclear cataract, right eye: Secondary | ICD-10-CM | POA: Diagnosis not present

## 2023-02-24 DIAGNOSIS — H269 Unspecified cataract: Secondary | ICD-10-CM | POA: Diagnosis not present

## 2023-02-24 DIAGNOSIS — H2511 Age-related nuclear cataract, right eye: Secondary | ICD-10-CM | POA: Diagnosis not present

## 2023-03-10 ENCOUNTER — Other Ambulatory Visit: Payer: Self-pay | Admitting: Medical

## 2023-03-15 ENCOUNTER — Encounter: Payer: Self-pay | Admitting: Family Medicine

## 2023-03-16 ENCOUNTER — Telehealth (INDEPENDENT_AMBULATORY_CARE_PROVIDER_SITE_OTHER): Payer: Medicare PPO | Admitting: Family Medicine

## 2023-03-16 ENCOUNTER — Encounter: Payer: Self-pay | Admitting: Family Medicine

## 2023-03-16 VITALS — BP 137/91 | HR 70 | Temp 96.7°F | Ht 59.0 in | Wt 122.0 lb

## 2023-03-16 DIAGNOSIS — J069 Acute upper respiratory infection, unspecified: Secondary | ICD-10-CM | POA: Diagnosis not present

## 2023-03-16 MED ORDER — GUAIFENESIN ER 600 MG PO TB12: 1200.0000 mg | ORAL_TABLET | Freq: Two times a day (BID) | ORAL | 0 refills | Status: DC

## 2023-03-16 MED ORDER — BENZONATATE 100 MG PO CAPS: 100.0000 mg | ORAL_CAPSULE | Freq: Two times a day (BID) | ORAL | 0 refills | Status: DC | PRN

## 2023-03-16 MED ORDER — AZITHROMYCIN 250 MG PO TABS: ORAL_TABLET | ORAL | 0 refills | Status: AC

## 2023-03-16 NOTE — Patient Instructions (Signed)
Given duration of symptoms, will go ahead and treat with Zpak (has worked well in the past), Tessalon as needed, and Mucinex. Continue supportive measures including rest, hydration, humidifier use, steam showers, warm compresses to sinuses, warm liquids with lemon and honey, and over-the-counter cough, cold, and analgesics as needed.    Please contact office for follow-up if symptoms do not improve or worsen. Seek emergency care if symptoms become severe.

## 2023-03-16 NOTE — Progress Notes (Signed)
Virtual Video Visit via MyChart Note  I connected with  Stephanie Barnes on 03/16/23 at 11:20 AM EDT by the video enabled telemedicine application for MyChart, and verified that I am speaking with the correct person using two identifiers.   I introduced myself as a Publishing rights manager with the practice. We discussed the limitations of evaluation and management by telemedicine and the availability of in person appointments. The patient expressed understanding and agreed to proceed.  Participating parties in this visit include: The patient and the nurse practitioner listed.  The patient is: At home I am: In the office - [facility]  Subjective:    CC:  Chief Complaint  Patient presents with   Cough    HPI: Stephanie Barnes is a 72 y.o. year old female presenting today via MyChart today for URI with cough.  Patient reports 7-8 days of URI symptoms. States it started like a head cold (rhinorrhea, nasal congestion, sore throat, ear pain) and settled into her chest. States her cough sounds productive but she has a hard time getting anything up. She has been taking Robitussin and running a humidifier without much improvement. She is fatigued, but denies and fevers, chills, body aches, sinus pain, chest pain, dyspnea, wheezing, GI/GU symptoms. Reports she usually responds well to a Zpak.      Past medical history, Surgical history, Family history not pertinant except as noted below, Social history, Allergies, and medications have been entered into the medical record, reviewed, and corrections made.   Review of Systems:  All review of systems negative except what is listed in the HPI   Objective:    General:  Speaking clearly in complete sentences. Absent shortness of breath noted.   Alert and oriented x3.   Normal judgment.  Absent acute distress.   Impression and Recommendations:    1. URI with cough and congestion - azithromycin (ZITHROMAX)  250 MG tablet; Take 2 tablets on day 1, then 1 tablet daily on days 2 through 5  Dispense: 6 tablet; Refill: 0 - benzonatate (TESSALON) 100 MG capsule; Take 1 capsule (100 mg total) by mouth 2 (two) times daily as needed for cough.  Dispense: 20 capsule; Refill: 0 - guaiFENesin (MUCINEX) 600 MG 12 hr tablet; Take 2 tablets (1,200 mg total) by mouth 2 (two) times daily.  Dispense: 30 tablet; Refill: 0  Given duration of symptoms, will go ahead and treat with Zpak (has worked well in the past), Tessalon as needed, and Mucinex. Continue supportive measures including rest, hydration, humidifier use, steam showers, warm compresses to sinuses, warm liquids with lemon and honey, and over-the-counter cough, cold, and analgesics as needed.      Follow-up if symptoms worsen or fail to improve.    I discussed the assessment and treatment plan with the patient. The patient was provided an opportunity to ask questions and all were answered. The patient agreed with the plan and demonstrated an understanding of the instructions.   The patient was advised to call back or seek an in-person evaluation if the symptoms worsen or if the condition fails to improve as anticipated.    Clayborne Dana, NP

## 2023-03-16 NOTE — Telephone Encounter (Signed)
Visit today w/ Stephanie Barnes

## 2023-03-16 NOTE — Progress Notes (Signed)
Started Tuesday "Head cold"  Feels like it is in her chest, can't seem to "get it out"  Cough  Taking Robitussin

## 2023-03-23 ENCOUNTER — Encounter: Payer: Self-pay | Admitting: Family Medicine

## 2023-04-11 DIAGNOSIS — W5503XA Scratched by cat, initial encounter: Secondary | ICD-10-CM | POA: Diagnosis not present

## 2023-04-11 DIAGNOSIS — Z23 Encounter for immunization: Secondary | ICD-10-CM | POA: Diagnosis not present

## 2023-04-11 DIAGNOSIS — S50812A Abrasion of left forearm, initial encounter: Secondary | ICD-10-CM | POA: Diagnosis not present

## 2023-04-12 ENCOUNTER — Encounter: Payer: Self-pay | Admitting: Family Medicine

## 2023-05-01 ENCOUNTER — Ambulatory Visit (INDEPENDENT_AMBULATORY_CARE_PROVIDER_SITE_OTHER): Payer: Medicare PPO | Admitting: *Deleted

## 2023-05-01 VITALS — BP 130/76 | HR 64 | Ht 59.0 in | Wt 122.2 lb

## 2023-05-01 DIAGNOSIS — Z0000 Encounter for general adult medical examination without abnormal findings: Secondary | ICD-10-CM | POA: Diagnosis not present

## 2023-05-01 NOTE — Patient Instructions (Signed)
[name] , Thank you for taking time to come for your Medicare Wellness Visit. I appreciate your ongoing commitment to your health goals. Please review the following plan we discussed and let me know if I can assist you in the future.    This is a list of the screening recommended for you and due dates:  Health Maintenance  Topic Date Due   Eye exam for diabetics  Never done   Yearly kidney health urinalysis for diabetes  Never done   COVID-19 Vaccine (6 - 2023-24 season) 07/11/2022   Flu Shot  06/11/2023   Yearly kidney function blood test for diabetes  01/08/2024   Colon Cancer Screening  03/20/2024   Medicare Annual Wellness Visit  04/30/2024   Mammogram  11/18/2024   DTaP/Tdap/Td vaccine (2 - Td or Tdap) 04/10/2033   Pneumonia Vaccine  Completed   DEXA scan (bone density measurement)  Completed   Hepatitis C Screening  Completed   Zoster (Shingles) Vaccine  Completed   HPV Vaccine  Aged Out   Complete foot exam   Discontinued   Hemoglobin A1C  Discontinued    Next appointment: Follow up in one year for your annual wellness visit.   Preventive Care 73 Years and Older, Female Preventive care refers to lifestyle choices and visits with your health care provider that can promote health and wellness. What does preventive care include? A yearly physical exam. This is also called an annual well check. Dental exams once or twice a year. Routine eye exams. Ask your health care provider how often you should have your eyes checked. Personal lifestyle choices, including: Daily care of your teeth and gums. Regular physical activity. Eating a healthy diet. Avoiding tobacco and drug use. Limiting alcohol use. Practicing safe sex. Taking low-dose aspirin every day. Taking vitamin and mineral supplements as recommended by your health care provider. What happens during an annual well check? The services and screenings done by your health care provider during your annual well check  will depend on your age, overall health, lifestyle risk factors, and family history of disease. Counseling  Your health care provider may ask you questions about your: Alcohol use. Tobacco use. Drug use. Emotional well-being. Home and relationship well-being. Sexual activity. Eating habits. History of falls. Memory and ability to understand (cognition). Work and work Astronomer. Reproductive health. Screening  You may have the following tests or measurements: Height, weight, and BMI. Blood pressure. Lipid and cholesterol levels. These may be checked every 5 years, or more frequently if you are over 51 years old. Skin check. Lung cancer screening. You may have this screening every year starting at age 13 if you have a 30-pack-year history of smoking and currently smoke or have quit within the past 15 years. Fecal occult blood test (FOBT) of the stool. You may have this test every year starting at age 61. Flexible sigmoidoscopy or colonoscopy. You may have a sigmoidoscopy every 5 years or a colonoscopy every 10 years starting at age 23. Hepatitis C blood test. Hepatitis B blood test. Sexually transmitted disease (STD) testing. Diabetes screening. This is done by checking your blood sugar (glucose) after you have not eaten for a while (fasting). You may have this done every 1-3 years. Bone density scan. This is done to screen for osteoporosis. You may have this done starting at age 23. Mammogram. This may be done every 1-2 years. Talk to your health care provider about how often you should have regular mammograms. Talk with your  health care provider about your test results, treatment options, and if necessary, the need for more tests. Vaccines  Your health care provider may recommend certain vaccines, such as: Influenza vaccine. This is recommended every year. Tetanus, diphtheria, and acellular pertussis (Tdap, Td) vaccine. You may need a Td booster every 10 years. Zoster vaccine. You  may need this after age 40. Pneumococcal 13-valent conjugate (PCV13) vaccine. One dose is recommended after age 57. Pneumococcal polysaccharide (PPSV23) vaccine. One dose is recommended after age 4. Talk to your health care provider about which screenings and vaccines you need and how often you need them. This information is not intended to replace advice given to you by your health care provider. Make sure you discuss any questions you have with your health care provider. Document Released: 11/23/2015 Document Revised: 07/16/2016 Document Reviewed: 08/28/2015 Elsevier Interactive Patient Education  2017 [place] Prevention in the Home Falls can cause injuries. They can happen to people of all ages. There are many things you can do to make your home safe and to help prevent falls. What can I do on the outside of my home? Regularly fix the edges of walkways and driveways and fix any cracks. Remove anything that might make you trip as you walk through a door, such as a raised step or threshold. Trim any bushes or trees on the path to your home. Use bright outdoor lighting. Clear any walking paths of anything that might make someone trip, such as rocks or tools. Regularly check to see if handrails are loose or broken. Make sure that both sides of any steps have handrails. Any raised decks and porches should have guardrails on the edges. Have any leaves, snow, or ice cleared regularly. Use sand or salt on walking paths during winter. Clean up any spills in your garage right away. This includes oil or grease spills. What can I do in the bathroom? Use night lights. Install grab bars by the toilet and in the tub and shower. Do not use towel bars as grab bars. Use non-skid mats or decals in the tub or shower. If you need to sit down in the shower, use a plastic, non-slip stool. Keep the floor dry. Clean up any water that spills on the floor as soon as it happens. Remove soap buildup  in the tub or shower regularly. Attach bath mats securely with double-sided non-slip rug tape. Do not have throw rugs and other things on the floor that can make you trip. What can I do in the bedroom? Use night lights. Make sure that you have a light by your bed that is easy to reach. Do not use any sheets or blankets that are too big for your bed. They should not hang down onto the floor. Have a firm chair that has side arms. You can use this for support while you get dressed. Do not have throw rugs and other things on the floor that can make you trip. What can I do in the kitchen? Clean up any spills right away. Avoid walking on wet floors. Keep items that you use a lot in easy-to-reach places. If you need to reach something above you, use a strong step stool that has a grab bar. Keep electrical cords out of the way. Do not use floor polish or wax that makes floors slippery. If you must use wax, use non-skid floor wax. Do not have throw rugs and other things on the floor that can make you trip. What  can I do with my stairs? Do not leave any items on the stairs. Make sure that there are handrails on both sides of the stairs and use them. Fix handrails that are broken or loose. Make sure that handrails are as long as the stairways. Check any carpeting to make sure that it is firmly attached to the stairs. Fix any carpet that is loose or worn. Avoid having throw rugs at the top or bottom of the stairs. If you do have throw rugs, attach them to the floor with carpet tape. Make sure that you have a light switch at the top of the stairs and the bottom of the stairs. If you do not have them, ask someone to add them for you. What else can I do to help prevent falls? Wear shoes that: Do not have high heels. Have rubber bottoms. Are comfortable and fit you well. Are closed at the toe. Do not wear sandals. If you use a stepladder: Make sure that it is fully opened. Do not climb a closed  stepladder. Make sure that both sides of the stepladder are locked into place. Ask someone to hold it for you, if possible. Clearly mark and make sure that you can see: Any grab bars or handrails. First and last steps. Where the edge of each step is. Use tools that help you move around (mobility aids) if they are needed. These include: Canes. Walkers. Scooters. Crutches. Turn on the lights when you go into a dark area. Replace any light bulbs as soon as they burn out. Set up your furniture so you have a clear path. Avoid moving your furniture around. If any of your floors are uneven, fix them. If there are any pets around you, be aware of where they are. Review your medicines with your doctor. Some medicines can make you feel dizzy. This can increase your chance of falling. Ask your doctor what other things that you can do to help prevent falls. This information is not intended to replace advice given to you by your health care provider. Make sure you discuss any questions you have with your health care provider. Document Released: 08/23/2009 Document Revised: 04/03/2016 Document Reviewed: 12/01/2014 Elsevier Interactive Patient Education  2017 [employer].

## 2023-05-01 NOTE — Progress Notes (Signed)
Subjective:   Stephanie Barnes is a 72 y.o. female who presents for Medicare Annual (Subsequent) preventive examination.  Visit Complete: In person  Patient Medicare AWV questionnaire was completed by the patient on 04/24/23; I have confirmed that all information answered by patient is correct and no changes since this date.  Review of Systems     Cardiac Risk Factors include: advanced age (>68men, >97 women);dyslipidemia     Objective:    Today's Vitals   05/01/23 1455  BP: 130/76  Pulse: 64  Weight: 122 lb 3.2 oz (55.4 kg)  Height: 4\' 11"  (1.499 m)   Body mass index is 24.68 kg/m.     05/01/2023    2:37 PM 04/29/2022    2:31 PM 04/24/2021    3:04 PM 08/12/2019    1:10 PM 03/15/2019    6:00 PM 03/10/2019    2:19 PM 04/01/2018    9:14 AM  Advanced Directives  Does Patient Have a Medical Advance Directive? Yes Yes Yes Yes Yes Yes Yes  Type of Estate agent of [place];Living will Healthcare Power of [place];Out of facility DNR (pink MOST or yellow form);Living will Healthcare Power of [address];Living will Healthcare Power of [place];Living will Healthcare Power of [place];Living will Healthcare Power of [place];Living will Healthcare Power of [place];Living will  Does patient want to make changes to medical advance directive? No - Patient declined No - Patient declined  No - Patient declined No - Patient declined No - Patient declined No - Patient declined  Copy of Healthcare Power of Attorney in Chart? Yes - validated most recent copy scanned in chart (See row information) Yes - validated most recent copy scanned in chart (See row information) Yes - validated most recent copy scanned in chart (See row information) Yes - validated most recent copy scanned in chart (See row information)  No - copy requested Yes    Current Medications (verified) Outpatient Encounter Medications as of 05/01/2023  Medication Sig   Probiotic Product (PROBIOTIC PO) Take by  mouth.   TURMERIC PO Take by mouth.   amoxicillin (AMOXIL) 500 MG tablet TAKE FOUR CAPSULES BY MOUTH ONE HOUR BEFORE APPOINTMENT (Patient not taking: Reported on 03/16/2023)   buPROPion (WELLBUTRIN) 75 MG tablet Take 75 mg by mouth daily.   cetirizine (ZYRTEC) 10 MG tablet Take 10 mg by mouth daily as needed for allergies.    clonazePAM (KLONOPIN) 1 MG tablet Take 0.5-1 tablets (0.5-1 mg total) by mouth 3 (three) times daily as needed for anxiety. (Patient taking differently: Take 0.5-1 mg by mouth at bedtime.)   famotidine (PEPCID) 40 MG tablet Take 1 tablet (40 mg total) by mouth 2 (two) times daily.   fluticasone (FLONASE) 50 MCG/ACT nasal spray Place 2 sprays into both nostrils daily. (Patient taking differently: Place 2 sprays into both nostrils daily as needed.)   gabapentin (NEURONTIN) 300 MG capsule Take 300 mg by mouth 3 (three) times daily.   Melatonin-Pyridoxine (MELATONEX) 3-10 MG TBCR Take 1 tablet by mouth at bedtime.   mometasone (ELOCON) 0.1 % ointment Apply topically daily. As needed   mupirocin cream (BACTROBAN) 2 % Apply 1 application. topically 2 (two) times daily.   ondansetron (ZOFRAN) 4 MG tablet TAKE 1 TABLET BY MOUTH EVERY 8 HOURS AS NEEDED FOR NAUSEA FOR VOMITING   Vitamin D, Ergocalciferol, (DRISDOL) 1.25 MG (50000 UNIT) CAPS capsule Take 1 capsule (50,000 Units total) by mouth every 7 (seven) days.   [DISCONTINUED] benzonatate (TESSALON) 100 MG capsule Take 1  capsule (100 mg total) by mouth 2 (two) times daily as needed for cough.   [DISCONTINUED] guaiFENesin (MUCINEX) 600 MG 12 hr tablet Take 2 tablets (1,200 mg total) by mouth 2 (two) times daily.   [DISCONTINUED] omeprazole (PRILOSEC) 40 MG capsule Take 1 capsule (40 mg total) by mouth daily as needed.   Facility-Administered Encounter Medications as of 05/01/2023  Medication   0.9 %  sodium chloride infusion    Allergies (verified) Aspirin, Celebrex [celecoxib], Doxycycline, Prednisone, Fosamax [alendronate  sodium], Other, Amitriptyline, Chlorhexidine, Celexa [citalopram], Clindamycin/lincomycin, and Tape   History: Past Medical History:  Diagnosis Date   Abnormal EKG 06/13/2013   Allergy 1991   See medical file for list.   Anemia 01/13/2016   Anxiety    Arthritis    Cataract 2/24   Have had cataracts removed from both eyes this year.   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   JOINT REPLACEMENT  03/15/19   Accident. Broke left shoulder & wrist. Left shoulder replace   LASIK     MOUTH SURGERY  08/10/2017   front lower   OPEN REDUCTION INTERNAL FIXATION (ORIF) DISTAL RADIAL FRACTURE Left 03/15/2019   Procedure: OPEN REDUCTION INTERNAL FIXATION (ORIF) DISTAL RADIAL FRACTURE;  Surgeon: Dominica Severin, MD;  Location: [facility] OR;  Service: Orthopedics;  Laterality: Left;   REFRACTIVE SURGERY Bilateral 2000   REVERSE SHOULDER ARTHROPLASTY Left 03/15/2019   Procedure: REVERSE SHOULDER ARTHROPLASTY;  Surgeon: Yolonda Kida, MD;  Location[facility];  Service: Orthopedics;  Laterality: Left;    ROTATOR CUFF REPAIR Right 2017   TONSILLECTOMY     Family History  Problem Relation Age of Onset   Heart disease Mother    Arthritis Mother    Heart disease Father    COPD Father    Colon polyps Father    Mental illness Brother        bipolar   Hypertension  Brother    Suicidality Brother    Alcohol abuse Brother    Anxiety disorder Brother    Depression Brother    Heart disease Maternal Aunt    Varicose Veins Maternal Aunt    Heart disease Maternal Uncle    Varicose Veins Maternal Uncle    Heart disease Paternal Aunt    COPD Paternal Aunt    Heart disease Paternal Uncle    Heart disease Maternal Grandmother    Heart disease Maternal Grandfather    Heart disease Paternal Grandmother    Heart disease Paternal Grandfather    Colon cancer Neg Hx    Esophageal cancer Neg Hx    Stomach cancer Neg Hx    Rectal cancer Neg Hx    Social History   Socioeconomic History   Marital status: Single    Spouse  name: Not on file   Number of children: Not on file   Years of education: Not on file   Highest education level: Not on file  Occupational History   Not on file  Tobacco Use   Smoking status: Never   Smokeless tobacco: Never  Vaping Use   Vaping Use: Never used  Substance and Sexual Activity   Alcohol use: Yes    Alcohol/week: 2.0 standard drinks of alcohol    Types: 2 Standard drinks or equivalent per week    Comment: 1-2 drinks per week   Drug use: Yes    Types: Marijuana    Comment: for nausea (every night)   Sexual activity: Not Currently    Comment: lives alone, retired from [employer], no dietary restrictions  Other Topics Concern   Not on file  Social History Narrative   Not on file   Social Determinants of Health   Financial Resource Strain: Low Risk  (04/24/2023)   Overall Financial Resource Strain (CARDIA)    Difficulty of Paying Living Expenses: Not very hard  Food Insecurity: No Food Insecurity (04/24/2023)   Hunger Vital Sign    Worried About Running Out of Food in the Last Year: Never true    Ran Out of Food in the Last Year: Never true  Transportation Needs: No Transportation Needs (04/24/2023)   PRAPARE - Administrator, Civil Service (Medical): No    Lack of Transportation  (Non-Medical): No  Physical Activity: Insufficiently Active (04/24/2023)   Exercise Vital Sign    Days of Exercise per Week: 2 days    Minutes of Exercise per Session: 30 min  Stress: No Stress Concern Present (04/24/2023)   [employer] of Occupational Health - Occupational Stress Questionnaire    Feeling of Stress : Only a little  Social Connections: Unknown (04/24/2023)   Social Connection and Isolation Panel [NHANES]    Frequency of Communication with Friends and Family: Three times a week    Frequency of Social Gatherings with Friends and Family: Once a week    Attends Religious Services: Not on Marketing executive or Organizations: Yes    Attends Engineer, structural: More than 4 times per year    Marital Status: Never married    Tobacco Counseling Counseling given: Not Answered   Clinical Intake:  Pre-visit preparation completed: Yes  Pain : No/denies pain  BMI - recorded: 24.68 Nutritional Status: BMI of 19-24  Normal Nutritional Risks: None Diabetes: No  How often do you need to have someone help you when you read instructions, pamphlets, or other written materials from your doctor or pharmacy?: 1 - Never  Interpreter Needed?: No  Information entered by :: Donne Anon, CMA   Activities of Daily Living    04/24/2023    4:57 PM  In your present state of health, do you have any difficulty performing the following activities:  Hearing? 0  Vision? 0  Difficulty concentrating or making decisions? 0  Walking or climbing stairs? 0  Dressing or bathing? 0  Doing errands, shopping? 0  Preparing Food and eating ? N  Using the Toilet? N  In the past six months, have you accidently leaked urine? N  Do you have problems with loss of bowel control? N  Managing your Medications? N  Managing your Finances? N  Housekeeping or managing your Housekeeping? N    Patient Care Team: Bradd Canary, MD as PCP - General (Family  Medicine) Charna Elizabeth, MD as Consulting Physician (Gastroenterology) Marry Guan, PT as Physical Therapist (Physical Therapy) Jene Every, MD as Consulting Physician (Orthopedic Surgery)  Indicate any recent Medical Services you may have received from other than [facility] providers in the past year (date may be approximate).     Assessment:   This is a routine wellness examination for [place].  Hearing/Vision screen No results found.  Dietary issues and exercise activities discussed:     Goals Addressed   None    Depression Screen    05/01/2023    3:08 PM 04/29/2022    2:54 PM 04/29/2022    2:43 PM 09/26/2021    3:11 PM 04/24/2021    3:07 PM 03/25/2021    9:53 AM 08/12/2019    1:19 PM  PHQ 2/9 Scores  PHQ - 2 Score 1 0 0 1 0 1 0  PHQ- 9 Score    1  1     Fall Risk    04/24/2023    4:57 PM 04/29/2022    2:40 PM 04/24/2021    3:06 PM 03/25/2021    9:52 AM 08/12/2019    1:19 PM  Fall Risk   Falls in the past year? 0 0 0 1 1  Number falls in past yr: 0 0 0 1 0  Injury with Fall? 0 0 0 0 1  Risk for fall due to : No Fall Risks No Fall Risks     Follow up Falls evaluation completed Falls evaluation completed Falls prevention discussed      MEDICARE RISK AT HOME:  Medicare Risk at Home - 05/01/23 1503     Any stairs in or around the home? No    If so, are there any without handrails? No    Home free of loose throw rugs in walkways, pet beds, electrical cords, etc? Yes    Adequate lighting in your home to reduce risk of falls? Yes    Life alert? No    Use of a cane, walker or w/c? No    Grab bars in the bathroom? Yes    Shower chair or bench in shower? Yes    Elevated toilet seat or a handicapped toilet? No             TIMED UP AND GO:  Was the test performed?  Yes  Length of time to ambulate 10 feet: 5 sec Gait steady and fast without use of assistive device    Cognitive Function:        05/01/2023    3:11 PM 04/29/2022    3:15 PM  6CIT Screen  What Year? 0 points 0  points  What month? 0 points 0 points  What time? 0 points 0 points  Count back from 20 0 points 0 points  Months in reverse 0 points 0 points  Repeat phrase 0 points 0 points  Total Score 0 points 0 points    Immunizations Immunization History  Administered Date(s) Administered   Fluad Quad(high Dose 65+) 08/25/2022   Influenza, High Dose Seasonal PF 08/03/2018, 07/11/2019, 07/11/2019   Influenza, Seasonal, Injecte, Preservative Fre 12/02/2012   Influenza,inj,Quad PF,6+ Mos 07/31/2014, 10/02/2015, 09/19/2016   Influenza-Unspecified 08/12/2017, 08/06/2020, 08/27/2021   PFIZER(Purple Top)SARS-COV-2 Vaccination 12/17/2019, 01/11/2020, 08/06/2020, 02/25/2021   Pfizer Covid-19 Vaccine Bivalent Booster 17yrs & up 08/27/2021   Pneumococcal Conjugate-13 10/02/2015   Pneumococcal Polysaccharide-23 05/14/2017   Rsv, Bivalent, Protein Subunit Rsvpref,pf Verdis Frederickson) 08/25/2022   Tdap 04/11/2023   Zoster Recombinat (  Shingrix) 08/08/2019, 10/14/2019   Zoster, Live 12/02/2012    TDAP status: Up to date  Flu Vaccine status: Up to date  Pneumococcal vaccine status: Up to date  Covid-19 vaccine status: Information provided on how to obtain vaccines.   Qualifies for Shingles Vaccine? Yes   Zostavax completed Yes   Shingrix Completed?: Yes  Screening Tests Health Maintenance  Topic Date Due   OPHTHALMOLOGY EXAM  Never done   Diabetic kidney evaluation - Urine ACR  Never done   COVID-19 Vaccine (6 - 2023-24 season) 07/11/2022   Medicare Annual Wellness (AWV)  04/30/2023   INFLUENZA VACCINE  06/11/2023   Diabetic kidney evaluation - eGFR measurement  01/08/2024   Colonoscopy  03/20/2024   MAMMOGRAM  11/18/2024   DTaP/Tdap/Td (2 - Td or Tdap) 04/10/2033   Pneumonia Vaccine 52+ Years old  Completed   DEXA SCAN  Completed   Hepatitis C Screening  Completed   Zoster Vaccines- Shingrix  Completed   HPV VACCINES  Aged Out   FOOT EXAM  Discontinued   HEMOGLOBIN A1C  Discontinued     Health Maintenance  Health Maintenance Due  Topic Date Due   OPHTHALMOLOGY EXAM  Never done   Diabetic kidney evaluation - Urine ACR  Never done   COVID-19 Vaccine (6 - 2023-24 season) 07/11/2022   Medicare Annual Wellness (AWV)  04/30/2023    Colorectal cancer screening: Type of screening: Colonoscopy. Completed 03/20/21. Repeat every 3 years  Mammogram status: Completed 11/18/22. Repeat every year  Bone Density status: Completed 10/21/21. Results reflect: Bone density results: OSTEOPENIA. Repeat every 2 years.  Lung Cancer Screening: (Low Dose CT Chest recommended if Age 52-80 years, 20 pack-year currently smoking OR have quit w/in 15years.) does not qualify.   Additional Screening:  Hepatitis C Screening: does qualify; Completed 09/19/16  Vision Screening: Recommended annual ophthalmology exams for early detection of glaucoma and other disorders of the eye. Is the patient up to date with their annual eye exam?  Yes  Who is the provider or what is the name of the office in which the patient attends annual eye exams? [facility]. If pt is not established with a provider, would they like to be referred to a provider to establish care? No .   Dental Screening: Recommended annual dental exams for proper oral hygiene  Diabetic Foot Exam: N/a  Community Resource Referral / Chronic Care Management: CRR required this visit?  No   CCM required this visit?  No     Plan:     I have personally reviewed and noted the following in the patient's chart:   Medical and social history Use of alcohol, tobacco or illicit drugs  Current medications and supplements including opioid prescriptions. Patient is not currently taking opioid prescriptions. Functional ability and status Nutritional status Physical activity Advanced directives List of other physicians Hospitalizations, surgeries, and ER visits in previous 12 months Vitals Screenings to include cognitive, depression, and  falls Referrals and appointments  In addition, I have reviewed and discussed with patient certain preventive protocols, quality metrics, and best practice recommendations. A written personalized care plan for preventive services as well as general preventive health recommendations were provided to patient.     Donne Anon, CMA   05/01/2023   After Visit Summary: (MyChart) Due to this being a telephonic visit, the after visit summary with patients personalized plan was offered to patient via MyChart   Nurse Notes: None

## 2023-05-05 ENCOUNTER — Encounter: Payer: Self-pay | Admitting: Family Medicine

## 2023-05-05 DIAGNOSIS — M1712 Unilateral primary osteoarthritis, left knee: Secondary | ICD-10-CM | POA: Diagnosis not present

## 2023-05-06 ENCOUNTER — Other Ambulatory Visit: Payer: Self-pay | Admitting: Family Medicine

## 2023-05-06 ENCOUNTER — Other Ambulatory Visit: Payer: Self-pay

## 2023-05-06 MED ORDER — CLONAZEPAM 1 MG PO TABS: 0.5000 mg | ORAL_TABLET | Freq: Every day | ORAL | 3 refills | Status: DC

## 2023-05-20 ENCOUNTER — Encounter: Payer: Self-pay | Admitting: Family Medicine

## 2023-06-04 ENCOUNTER — Other Ambulatory Visit: Payer: Self-pay | Admitting: Family Medicine

## 2023-06-05 ENCOUNTER — Other Ambulatory Visit: Payer: Self-pay | Admitting: Family Medicine

## 2023-06-05 ENCOUNTER — Encounter: Payer: Self-pay | Admitting: Family Medicine

## 2023-06-05 MED ORDER — BUPROPION HCL 75 MG PO TABS: 75.0000 mg | ORAL_TABLET | Freq: Every day | ORAL | 0 refills | Status: DC

## 2023-06-11 ENCOUNTER — Other Ambulatory Visit: Payer: Self-pay | Admitting: Medical

## 2023-07-03 ENCOUNTER — Encounter: Payer: Self-pay | Admitting: Family Medicine

## 2023-08-05 ENCOUNTER — Encounter: Payer: Self-pay | Admitting: Family Medicine

## 2023-09-01 ENCOUNTER — Other Ambulatory Visit: Payer: Self-pay | Admitting: Family Medicine

## 2023-09-02 NOTE — Telephone Encounter (Signed)
Requesting: clonazepam 1mg  Contract: 04/03/21 UDS: 03/27/22 Last Visit: 03/16/23 w/ Ladona Ridgel Next Visit: None Last Refill: 05/06/23 #30 and 3RF  Please Advise

## 2023-09-03 DIAGNOSIS — M1712 Unilateral primary osteoarthritis, left knee: Secondary | ICD-10-CM | POA: Diagnosis not present

## 2023-10-05 ENCOUNTER — Other Ambulatory Visit: Payer: Self-pay | Admitting: Family Medicine

## 2023-10-05 NOTE — Telephone Encounter (Signed)
Requesting: clonazepam 1mg   Contract: 03/27/22 UDS: 03/27/22 Last Visit: 03/16/23 w/ Ladona Ridgel Next Visit: None Last Refill: 09/02/23 #30 and 0RF   Please Advise

## 2023-10-06 ENCOUNTER — Telehealth (INDEPENDENT_AMBULATORY_CARE_PROVIDER_SITE_OTHER): Payer: Medicare PPO | Admitting: Family Medicine

## 2023-10-06 DIAGNOSIS — E782 Mixed hyperlipidemia: Secondary | ICD-10-CM | POA: Diagnosis not present

## 2023-10-06 DIAGNOSIS — F418 Other specified anxiety disorders: Secondary | ICD-10-CM | POA: Diagnosis not present

## 2023-10-06 DIAGNOSIS — E559 Vitamin D deficiency, unspecified: Secondary | ICD-10-CM

## 2023-10-06 DIAGNOSIS — G4700 Insomnia, unspecified: Secondary | ICD-10-CM | POA: Diagnosis not present

## 2023-10-06 DIAGNOSIS — R739 Hyperglycemia, unspecified: Secondary | ICD-10-CM | POA: Diagnosis not present

## 2023-10-06 MED ORDER — BUPROPION HCL 75 MG PO TABS: 75.0000 mg | ORAL_TABLET | Freq: Two times a day (BID) | ORAL | 1 refills | Status: DC

## 2023-10-06 MED ORDER — CLONAZEPAM 1 MG PO TABS: 0.5000 mg | ORAL_TABLET | Freq: Two times a day (BID) | ORAL | 1 refills | Status: DC | PRN

## 2023-10-06 NOTE — Assessment & Plan Note (Signed)
Supplement and monitor 

## 2023-10-06 NOTE — Assessment & Plan Note (Addendum)
She just lost a close family member, her nephew to suicide via gun. He was a veteran with PTSD and had a breakdown. She is tearful. Will increase her Wellbutrin to 75 mg bid and increase Klonopin to 0.5-1 mg bid prn.

## 2023-10-06 NOTE — Assessment & Plan Note (Signed)
minimize simple carbs. Increase exercise as tolerated.

## 2023-10-06 NOTE — Assessment & Plan Note (Signed)
Encourage heart healthy diet such as MIND or DASH diet, increase exercise, avoid trans fats, simple carbohydrates and processed foods, consider a krill or fish or flaxseed oil cap daily.  °

## 2023-10-06 NOTE — Assessment & Plan Note (Signed)
Maintain good sleep hygiene such as dark, quiet room. No blue/green glowing lights such as computer screens in bedroom. No alcohol or stimulants in evening. Cut down on caffeine as able. Regular exercise is helpful but not just prior to bed time.

## 2023-10-07 ENCOUNTER — Encounter: Payer: Self-pay | Admitting: Family Medicine

## 2023-10-07 NOTE — Progress Notes (Addendum)
Subjective:    Patient ID: Stephanie Barnes, female    DOB: 1950-12-27, 72 y.o.   MRN: [id number] Visit was accomplished via Video connection with patient. The patient needs were able to be met via video visit and was thought to be the best approach to accomplish her care Chief Complaint  Patient presents with   Follow-up    HPI Discussed the use of AI scribe software for clinical note transcription with the patient, who gave verbal consent to proceed.  History of Present Illness   The patient, with a history of mental health issues, presents with increased emotional distress following the recent suicide of her nephew. She reports feeling overwhelmed and is struggling to cope with the loss. She also mentions concerns about the potential genetic predisposition to mental health issues in her family, given the history of bipolar disorder and suicide.  In addition to her mental health concerns, the patient has been managing lactose intolerance, which was diagnosed after a series of investigations for stomach issues. She has made significant dietary changes to manage this condition and reports success in adapting to a dairy-free diet.  The patient is currently on Wellbutrin and Klonopin. She has requested an increase in her Wellbutrin dosage, feeling that she could benefit from a higher dose during this difficult time. She also mentions using Klonopin, primarily at night, and expresses some hesitation about increasing the dosage due to potential drowsiness.        Past Medical History:  Diagnosis Date   Abnormal EKG 06/13/2013   Allergy 1991   See medical file for list.   Anemia 01/13/2016   Anxiety    Arthritis    Cataract 2/24   Have had cataracts removed from both eyes this year.   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   JOINT REPLACEMENT  03/15/19   Accident. Broke left shoulder & wrist. Left shoulder replace   LASIK     MOUTH SURGERY  08/10/2017   front lower   OPEN REDUCTION INTERNAL FIXATION (ORIF) DISTAL RADIAL FRACTURE Left 03/15/2019   Procedure: OPEN REDUCTION INTERNAL FIXATION (ORIF) DISTAL RADIAL FRACTURE;  Surgeon: Dominica Severin, MD;  Location: [facility] OR;  Service: Orthopedics;  Laterality: Left;   REFRACTIVE SURGERY Bilateral 2000   REVERSE SHOULDER ARTHROPLASTY Left 03/15/2019   Procedure: REVERSE SHOULDER ARTHROPLASTY;  Surgeon: Yolonda Kida, MD;  Location: [facility] OR;  Service: Orthopedics;  Laterality: Left;    ROTATOR CUFF REPAIR Right 2017   TONSILLECTOMY      Family History  Problem Relation Age of Onset   Heart disease Mother    Arthritis Mother    Heart disease Father    COPD Father    Colon polyps Father    Mental illness  Brother        bipolar   Hypertension Brother    Suicidality Brother    Alcohol abuse Brother    Anxiety disorder Brother    Depression Brother    Heart disease Maternal Aunt    Varicose Veins Maternal Aunt    Heart disease Maternal Uncle    Varicose Veins Maternal Uncle    Heart disease Paternal Aunt    COPD Paternal Aunt    Heart disease Paternal Uncle    Heart disease Maternal Grandmother    Heart disease Maternal Grandfather    Heart disease Paternal Grandmother    Heart disease Paternal Grandfather    Colon cancer Neg Hx    Esophageal cancer Neg Hx     Stomach cancer Neg Hx    Rectal cancer Neg Hx     Social History   Socioeconomic History   Marital status: Single    Spouse name: Not on file   Number of children: Not on file   Years of education: Not on file   Highest education level: Not on file  Occupational History   Not on file  Tobacco Use   Smoking status: Never   Smokeless tobacco: Never  Vaping Use   Vaping status: Never Used  Substance and Sexual Activity   Alcohol use: Yes    Alcohol/week: 2.0 standard drinks of alcohol    Types: 2 Standard drinks or equivalent per week    Comment: 1-2 drinks per week   Drug use: Yes    Types: Marijuana    Comment: for nausea (every night)   Sexual activity: Not Currently    Comment: lives alone, retired from [employer], no dietary restrictions  Other Topics Concern   Not on file  Social History Narrative   Not on file   Social Determinants of Health   Financial Resource Strain: Low Risk  (04/24/2023)   Overall Financial Resource Strain (CARDIA)    Difficulty of Paying Living Expenses: Not very hard  Food Insecurity: No Food Insecurity (04/24/2023)   Hunger Vital Sign    Worried About Running Out of Food in the Last Year: Never true    Ran Out of Food in the Last Year: Never true  Transportation Needs: No Transportation Needs (04/24/2023)   PRAPARE - Administrator, Civil Service (Medical): No    Lack of Transportation (Non-Medical): No  Physical Activity: Insufficiently Active (04/24/2023)   Exercise Vital Sign    Days of Exercise per Week: 2 days    Minutes of Exercise per Session: 30 min  Stress: No Stress Concern Present (04/24/2023)   [employer] of Occupational Health - Occupational Stress Questionnaire    Feeling of Stress : Only a little  Social Connections: Unknown (04/24/2023)   Social Connection and Isolation Panel [NHANES]    Frequency of Communication with Friends and Family: Three times a week    Frequency of Social Gatherings with  Friends and Family: Once a week    Attends Religious Services: Not on file    Active Member of Clubs or Organizations: Yes    Attends Banker Meetings: More than 4 times per year    Marital Status: Never married  Intimate Partner Violence: Not At Risk (05/01/2023)   Humiliation, Afraid, Rape, and Kick questionnaire    Fear of Current or Ex-Partner: No    Emotionally Abused: No    Physically Abused: No    Sexually Abused: No    Outpatient Medications  Prior to Visit  Medication Sig Dispense Refill   cetirizine (ZYRTEC) 10 MG tablet Take 10 mg by mouth daily as needed for allergies.      famotidine (PEPCID) 40 MG tablet Take 1 tablet (40 mg total) by mouth 2 (two) times daily. 90 tablet 3   fluticasone (FLONASE) 50 MCG/ACT nasal spray Place 2 sprays into both nostrils daily. (Patient taking differently: Place 2 sprays into both nostrils daily as needed.) 1 g 0   gabapentin (NEURONTIN) 300 MG capsule Take 300 mg by mouth 3 (three) times daily.     Melatonin-Pyridoxine (MELATONEX) 3-10 MG TBCR Take 1 tablet by mouth at bedtime.     mometasone (ELOCON) 0.1 % ointment Apply topically daily. As needed 15 g 2   mupirocin cream (BACTROBAN) 2 % Apply 1 application. topically 2 (two) times daily. 30 g 1   ondansetron (ZOFRAN) 4 MG tablet TAKE 1 TABLET BY MOUTH EVERY 8 HOURS AS NEEDED FOR NAUSEA FOR VOMITING 20 tablet 0   Probiotic Product (PROBIOTIC PO) Take by mouth.     TURMERIC PO Take by mouth.     Vitamin D, Ergocalciferol, (DRISDOL) 1.25 MG (50000 UNIT) CAPS capsule Take 1 capsule (50,000 Units total) by mouth every 7 (seven) days. 12 capsule 0   buPROPion (WELLBUTRIN) 75 MG tablet Take 1 tablet (75 mg total) by mouth daily. 90 tablet 0   clonazePAM (KLONOPIN) 1 MG tablet TAKE 1/2 TO 1 (ONE-HALF TO ONE) TABLET BY MOUTH AT BEDTIME 30 tablet 0   No facility-administered medications prior to visit.    Allergies  Allergen Reactions   Aspirin Anaphylaxis and Swelling    Swelling  of lips and fingers    Celebrex [Celecoxib] Anaphylaxis, Hives and Shortness Of Breath   Doxycycline Other (See Comments)    Scarred throat    Prednisone Shortness Of Breath and Other (See Comments)    Chest tightness    Fosamax [Alendronate Sodium] Hives   Other Hives and Nausea Only    Arthritec   Amitriptyline     UNSPECIFIED REACTION    Chlorhexidine     UNSPECIFIED REACTION    Lactose Intolerance (Gi) Nausea Only   Celexa [Citalopram] Other (See Comments)    Shakiness    Clindamycin/Lincomycin Nausea And Vomiting   Tape Itching and Other (See Comments)    UNSPECIFIED REACTION  Paper tape is okay to use per patient    Review of Systems  Constitutional:  Positive for malaise/fatigue. Negative for fever.  HENT:  Negative for congestion.   Eyes:  Negative for blurred vision.  Respiratory:  Negative for shortness of breath.   Cardiovascular:  Negative for chest pain, palpitations and leg swelling.  Gastrointestinal:  Negative for abdominal pain, blood in stool and nausea.  Genitourinary:  Negative for dysuria and frequency.  Musculoskeletal:  Positive for joint pain and myalgias. Negative for falls.  Skin:  Negative for rash.  Neurological:  Negative for dizziness, loss of consciousness and headaches.  Endo/Heme/Allergies:  Negative for environmental allergies.  Psychiatric/Behavioral:  Positive for depression. The patient is nervous/anxious.       Objective:    Physical Exam Constitutional:      General: She is not in acute distress.    Appearance: Normal appearance. She is well-developed. She is not toxic-appearing.  HENT:     Head: Normocephalic and atraumatic.     Right Ear: External ear normal.     Left Ear: External ear normal.     Nose: Nose normal.  Eyes:     General:        Right eye: No discharge.        Left eye: No discharge.     Conjunctiva/sclera: Conjunctivae normal.  Neck:     Thyroid: No thyromegaly.  Cardiovascular:     Rate and Rhythm: Normal  rate and regular rhythm.     Heart sounds: Normal heart sounds. No murmur heard. Pulmonary:     Effort: Pulmonary effort is normal. No respiratory distress.     Breath sounds: Normal breath sounds.  Abdominal:     General: Bowel sounds are normal.     Palpations: Abdomen is soft.     Tenderness: There is no abdominal tenderness. There is no guarding.  Musculoskeletal:        General: Normal range of motion.     Cervical back: Neck supple.  Lymphadenopathy:     Cervical: No cervical adenopathy.  Skin:    General: Skin is warm and dry.  Neurological:     Mental Status: She is alert and oriented to person, place, and time.  Psychiatric:        Mood and Affect: Mood normal.        Behavior: Behavior normal.        Thought Content: Thought content normal.        Judgment: Judgment normal.   There were no vitals taken for this visit. Wt Readings from Last 3 Encounters:  05/01/23 122 lb 3.2 oz (55.4 kg)  03/16/23 122 lb (55.3 kg)  12/23/22 126 lb (57.2 kg)    Diabetic Foot Exam - Simple   No data filed    Lab Results  Component Value Date   WBC 5.2 09/22/2022   HGB 12.4 09/22/2022   HCT 37.9 09/22/2022   PLT 194.0 09/22/2022   GLUCOSE 140 (H) 01/08/2023   CHOL 229 (H) 01/08/2023   TRIG 128.0 01/08/2023   HDL 75.10 01/08/2023   LDLCALC 129 (H) 01/08/2023   ALT 9 01/08/2023   AST 13 01/08/2023   NA 139 01/08/2023   K 4.1 01/08/2023   CL 102 01/08/2023   CREATININE 0.80 01/08/2023   BUN 18 01/08/2023   CO2 29 01/08/2023   TSH 1.23 01/08/2023   HGBA1C 5.6 09/26/2021    Lab Results  Component Value Date   TSH 1.23 01/08/2023   Lab Results  Component Value Date   WBC 5.2 09/22/2022   HGB 12.4 09/22/2022   HCT 37.9 09/22/2022   MCV 92.4 09/22/2022   PLT 194.0 09/22/2022   Lab Results  Component Value Date   NA 139 01/08/2023   K 4.1 01/08/2023   CO2 29 01/08/2023   GLUCOSE 140 (H) 01/08/2023   BUN 18 01/08/2023   CREATININE 0.80 01/08/2023   BILITOT 0.3  01/08/2023   ALKPHOS 55 01/08/2023   AST 13 01/08/2023   ALT 9 01/08/2023   PROT 6.6 01/08/2023   ALBUMIN 4.0 01/08/2023   CALCIUM 9.1 01/08/2023   ANIONGAP 14 03/10/2019   GFR 73.58 01/08/2023   Lab Results  Component Value Date   CHOL 229 (H) 01/08/2023   Lab Results  Component Value Date   HDL 75.10 01/08/2023   Lab Results  Component Value Date   LDLCALC 129 (H) 01/08/2023   Lab Results  Component Value Date   TRIG 128.0 01/08/2023   Lab Results  Component Value Date   CHOLHDL 3 01/08/2023   Lab Results  Component Value Date   HGBA1C 5.6  09/26/2021       Assessment & Plan:  Depression with anxiety Assessment & Plan: She just lost a close family member, her nephew to suicide via gun. He was a veteran with PTSD and had a breakdown. She is tearful. Will increase her Wellbutrin to 75 mg bid and increase Klonopin to 0.5-1 mg bid prn.   Orders: -     CBC with Differential/Platelet; Future  Mixed hyperlipidemia Assessment & Plan: Encourage heart healthy diet such as MIND or DASH diet, increase exercise, avoid trans fats, simple carbohydrates and processed foods, consider a krill or fish or flaxseed oil cap daily.    Orders: -     CBC with Differential/Platelet; Future -     Lipid panel; Future -     TSH; Future  Insomnia, unspecified type Assessment & Plan: Maintain good sleep hygiene such as dark, quiet room. No blue/green glowing lights such as computer screens in bedroom. No alcohol or stimulants in evening. Cut down on caffeine as able. Regular exercise is helpful but not just prior to bed time.     Vitamin D deficiency Assessment & Plan: Supplement and monitor   Orders: -     Vitamin D 1,25 dihydroxy; Future  Hyperglycemia Assessment & Plan:  minimize simple carbs. Increase exercise as tolerated.   Orders: -     CBC with Differential/Platelet; Future -     Comprehensive metabolic panel; Future -     Hemoglobin A1c; Future -     TSH;  Future  Other orders -     buPROPion HCl; Take 1 tablet (75 mg total) by mouth 2 (two) times daily.  Dispense: 180 tablet; Refill: 1 -     clonazePAM; Take 0.5-1 tablets (0.5-1 mg total) by mouth 2 (two) times daily as needed for anxiety.  Dispense: 60 tablet; Refill: 1    Assessment and Plan    Grief and Depression Recent loss of a family member leading to increased emotional distress. Currently on Wellbutrin 75mg  daily. Discussed increasing the dose to manage the increased emotional distress. -Increase Wellbutrin to 75mg  twice daily. -Consider splitting the dose to half in the afternoon and half at night to assess tolerance.  Anxiety Currently on Klonopin 0.5mg  at night. Discussed the possibility of increasing the dose during this crisis period. -Allow for a second tablet of Klonopin 0.5mg  as needed during the day.  General Health Maintenance Last blood work was in February, with slightly elevated sugar and low vitamin D. Patient has made significant dietary changes due to lactose intolerance. -Order labs to assess current health status. -Schedule follow-up appointment in late January/early February to review lab results and assess the effectiveness of the increased Wellbutrin dose.         Danise Edge, MD

## 2023-10-16 ENCOUNTER — Encounter: Payer: Self-pay | Admitting: Family Medicine

## 2023-10-19 ENCOUNTER — Other Ambulatory Visit: Payer: Self-pay | Admitting: *Deleted

## 2023-10-19 DIAGNOSIS — F418 Other specified anxiety disorders: Secondary | ICD-10-CM

## 2023-10-19 DIAGNOSIS — Z79899 Other long term (current) drug therapy: Secondary | ICD-10-CM

## 2023-10-20 ENCOUNTER — Encounter: Payer: Self-pay | Admitting: Family Medicine

## 2023-10-22 ENCOUNTER — Other Ambulatory Visit (INDEPENDENT_AMBULATORY_CARE_PROVIDER_SITE_OTHER): Payer: Medicare PPO

## 2023-10-22 DIAGNOSIS — E782 Mixed hyperlipidemia: Secondary | ICD-10-CM

## 2023-10-22 DIAGNOSIS — R739 Hyperglycemia, unspecified: Secondary | ICD-10-CM

## 2023-10-22 DIAGNOSIS — F418 Other specified anxiety disorders: Secondary | ICD-10-CM

## 2023-10-22 DIAGNOSIS — Z79899 Other long term (current) drug therapy: Secondary | ICD-10-CM

## 2023-10-22 DIAGNOSIS — E559 Vitamin D deficiency, unspecified: Secondary | ICD-10-CM

## 2023-10-22 LAB — CBC WITH DIFFERENTIAL/PLATELET
Basophils Absolute: 0 10*3/uL (ref 0.0–0.1)
Basophils Relative: 0.7 % (ref 0.0–3.0)
Eosinophils Absolute: 0 10*3/uL (ref 0.0–0.7)
Eosinophils Relative: 0.8 % (ref 0.0–5.0)
HCT: 39.6 % (ref 36.0–46.0)
Hemoglobin: 13 g/dL (ref 12.0–15.0)
Lymphocytes Relative: 24.9 % (ref 12.0–46.0)
Lymphs Abs: 1.3 10*3/uL (ref 0.7–4.0)
MCHC: 32.8 g/dL (ref 30.0–36.0)
MCV: 94.7 fL (ref 78.0–100.0)
Monocytes Absolute: 0.4 10*3/uL (ref 0.1–1.0)
Monocytes Relative: 6.9 % (ref 3.0–12.0)
Neutro Abs: 3.4 10*3/uL (ref 1.4–7.7)
Neutrophils Relative %: 66.7 % (ref 43.0–77.0)
Platelets: 213 10*3/uL (ref 150.0–400.0)
RBC: 4.18 Mil/uL (ref 3.87–5.11)
RDW: 13 % (ref 11.5–15.5)
WBC: 5.2 10*3/uL (ref 4.0–10.5)

## 2023-10-22 LAB — COMPREHENSIVE METABOLIC PANEL
ALT: 21 U/L (ref 0–35)
AST: 19 U/L (ref 0–37)
Albumin: 4.1 g/dL (ref 3.5–5.2)
Alkaline Phosphatase: 180 U/L — ABNORMAL HIGH (ref 39–117)
BUN: 20 mg/dL (ref 6–23)
CO2: 29 meq/L (ref 19–32)
Calcium: 9 mg/dL (ref 8.4–10.5)
Chloride: 106 meq/L (ref 96–112)
Creatinine, Ser: 0.85 mg/dL (ref 0.40–1.20)
GFR: 68.04 mL/min (ref >60.00)
Glucose, Bld: 96 mg/dL (ref 70–99)
Potassium: 4.2 meq/L (ref 3.5–5.1)
Sodium: 142 meq/L (ref 135–145)
Total Bilirubin: 0.4 mg/dL (ref 0.2–1.2)
Total Protein: 6.4 g/dL (ref 6.0–8.3)

## 2023-10-22 LAB — LIPID PANEL
Cholesterol: 234 mg/dL — ABNORMAL HIGH (ref 0–200)
HDL: 81.4 mg/dL (ref >39.00)
LDL Cholesterol: 125 mg/dL — ABNORMAL HIGH (ref 0–99)
NonHDL: 153.09
Total CHOL/HDL Ratio: 3
Triglycerides: 142 mg/dL (ref 0.0–149.0)
VLDL: 28.4 mg/dL (ref 0.0–40.0)

## 2023-10-22 LAB — TSH: TSH: 1.04 u[IU]/mL (ref 0.35–5.50)

## 2023-10-22 LAB — HEMOGLOBIN A1C: Hgb A1c MFr Bld: 5.3 % (ref 4.6–6.5)

## 2023-10-23 ENCOUNTER — Ambulatory Visit: Payer: Medicare PPO

## 2023-10-23 ENCOUNTER — Other Ambulatory Visit: Payer: Self-pay | Admitting: *Deleted

## 2023-10-23 DIAGNOSIS — R748 Abnormal levels of other serum enzymes: Secondary | ICD-10-CM | POA: Diagnosis not present

## 2023-10-23 NOTE — Progress Notes (Signed)
alk

## 2023-10-24 LAB — DRUG MONITORING PANEL 376104, URINE
Alphahydroxyalprazolam: NEGATIVE ng/mL (ref <25)
Alphahydroxymidazolam: NEGATIVE ng/mL (ref <50)
Alphahydroxytriazolam: NEGATIVE ng/mL (ref <50)
Aminoclonazepam: 562 ng/mL — ABNORMAL HIGH (ref <25)
Amphetamines: NEGATIVE ng/mL (ref <500)
Barbiturates: NEGATIVE ng/mL (ref <300)
Benzodiazepines: POSITIVE ng/mL — AB (ref <100)
Cocaine Metabolite: NEGATIVE ng/mL (ref <150)
Desmethyltramadol: NEGATIVE ng/mL (ref <100)
Hydroxyethylflurazepam: NEGATIVE ng/mL (ref <50)
Lorazepam: NEGATIVE ng/mL (ref <50)
Nordiazepam: NEGATIVE ng/mL (ref <50)
Opiates: NEGATIVE ng/mL (ref <100)
Oxazepam: NEGATIVE ng/mL (ref <50)
Oxycodone: NEGATIVE ng/mL (ref <100)
Temazepam: NEGATIVE ng/mL (ref <50)
Tramadol: NEGATIVE ng/mL (ref <100)

## 2023-10-24 LAB — DM TEMPLATE

## 2023-10-25 LAB — VITAMIN D 1,25 DIHYDROXY
Vitamin D 1, 25 (OH)2 Total: 72 pg/mL (ref 18–72)
Vitamin D2 1, 25 (OH)2: 10 pg/mL
Vitamin D3 1, 25 (OH)2: 62 pg/mL

## 2023-10-27 ENCOUNTER — Telehealth: Payer: Self-pay | Admitting: Family Medicine

## 2023-10-27 ENCOUNTER — Encounter: Payer: Self-pay | Admitting: Family Medicine

## 2023-10-27 NOTE — Telephone Encounter (Signed)
Carollee Herter [facility]) called stating that they had received a request for the last diabetic eye exam and they did not have the pt marked as diabetic. Carollee Herter was wanting to know if we wanted the last OV note or if she needed to have a diabetic eye exam done.

## 2023-10-27 NOTE — Telephone Encounter (Signed)
 Care team updated and letter sent for eye exam notes.

## 2023-10-28 NOTE — Telephone Encounter (Signed)
Modifier removed.  Looks like there was a dx code back 05/14/17 note that was hyperlipidemia associated with type 2 diabetes.

## 2023-10-29 LAB — ALKALINE PHOSPHATASE ISOENZYMES
Alkaline phosphatase (APISO): 179 U/L — ABNORMAL HIGH (ref 37–153)
Bone Isoenzymes: 29 % (ref 28–66)
Intestinal Isoenzymes: 0 % — ABNORMAL LOW (ref 1–24)
Liver Isoenzymes: 53 % (ref 25–69)
Macrohepatic isoenzymes: 18 % — ABNORMAL HIGH (ref <=0)
Placental isoenzymes: 0 % (ref <=0)

## 2023-11-01 ENCOUNTER — Encounter: Payer: Self-pay | Admitting: Family Medicine

## 2023-11-02 ENCOUNTER — Other Ambulatory Visit: Payer: Self-pay | Admitting: Emergency Medicine

## 2023-11-02 DIAGNOSIS — R739 Hyperglycemia, unspecified: Secondary | ICD-10-CM

## 2023-11-02 DIAGNOSIS — E782 Mixed hyperlipidemia: Secondary | ICD-10-CM

## 2023-11-02 NOTE — Telephone Encounter (Signed)
Ok I'll let the patient know. Thank you!

## 2023-11-02 NOTE — Telephone Encounter (Signed)
Called patient and she doesn't want to repeat the Alkaline lab. She said she will watch her carb intake. Wants to wait and talk to you about it at your virtual visit in February. She wants to do lab work the last week of January. The only 2 test I saw abnormal were the CMP and Lipid Panel.

## 2023-11-06 ENCOUNTER — Encounter: Payer: Self-pay | Admitting: Family Medicine

## 2023-11-19 ENCOUNTER — Other Ambulatory Visit: Payer: Self-pay | Admitting: Medical

## 2023-11-19 ENCOUNTER — Other Ambulatory Visit: Payer: Self-pay | Admitting: Gastroenterology

## 2023-12-13 NOTE — Assessment & Plan Note (Signed)
She continues to struggle with daily debility but is managing well most days

## 2023-12-13 NOTE — Assessment & Plan Note (Signed)
Stable on current meds, no changes. 

## 2023-12-13 NOTE — Assessment & Plan Note (Signed)
 Supplement and monitor

## 2023-12-13 NOTE — Assessment & Plan Note (Signed)
 Encouraged to get adequate exercise, calcium and vitamin d intake

## 2023-12-15 ENCOUNTER — Encounter: Payer: Self-pay | Admitting: Family Medicine

## 2023-12-15 ENCOUNTER — Telehealth: Payer: Medicare HMO | Admitting: Family Medicine

## 2023-12-15 VITALS — BP 134/83 | HR 84 | Ht 59.0 in | Wt 118.0 lb

## 2023-12-15 DIAGNOSIS — R739 Hyperglycemia, unspecified: Secondary | ICD-10-CM

## 2023-12-15 DIAGNOSIS — F418 Other specified anxiety disorders: Secondary | ICD-10-CM

## 2023-12-15 DIAGNOSIS — E782 Mixed hyperlipidemia: Secondary | ICD-10-CM

## 2023-12-15 DIAGNOSIS — M8580 Other specified disorders of bone density and structure, unspecified site: Secondary | ICD-10-CM | POA: Diagnosis not present

## 2023-12-15 DIAGNOSIS — G90513 Complex regional pain syndrome I of upper limb, bilateral: Secondary | ICD-10-CM | POA: Diagnosis not present

## 2023-12-15 DIAGNOSIS — E559 Vitamin D deficiency, unspecified: Secondary | ICD-10-CM | POA: Diagnosis not present

## 2023-12-15 NOTE — Progress Notes (Signed)
MyChart Video Visit    Virtual Visit via Video Note   This patient is at least at moderate risk for complications without adequate follow up. This format is felt to be most appropriate for this patient at this time. Physical exam was limited by quality of the video and audio technology used for the visit. Juanetta, CMA was able to get the patient set up on a video visit.  Patient location: Home Patient and provider in visit Provider location: Office  I discussed the limitations of evaluation and management by telemedicine and the availability of in person appointments. The patient expressed understanding and agreed to proceed.  Visit Date: 12/15/2023  Today's healthcare provider: Danise Edge, MD     Subjective:    Patient ID: Stephanie Barnes, female    DOB: 1951-04-26, 73 y.o.   MRN: [id number]  Chief Complaint  Patient presents with   Follow-up    HPI Discussed the use of AI scribe software for clinical note transcription with the patient, who gave verbal consent to proceed.  History of Present Illness   The patient, with a history of Complex Regional Pain Syndrome (CRPS), presents with worsening numbness in her hand and arm, particularly at night. The numbness, initially confined to the palm and bottom of three fingers, has now spread to the top of the hand and the fourth finger. The patient reports a sensation of wearing a brace up her arm due to the numbness. This symptom is causing concern and discomfort, particularly as it seems to worsen at night.  In addition to the CRPS, the patient has been dealing with significant emotional stress following the loss of family members. She has been seeing a grief counselor and reports some improvement, but still has periods of intense sadness.  The patient has also recently discovered she is lactose intolerant and has made significant dietary changes as a result. She has switched to plant-based alternatives for dairy and has noticed  some weight loss. She reports some uncertainty about ensuring she is getting enough calcium and protein in her diet.        Past Medical History:  Diagnosis Date   Abnormal EKG 06/13/2013   Allergy 1991   See medical file for list.   Anemia 01/13/2016   Anxiety    Arthritis    Cataract 2/24   Have had cataracts removed from both eyes this year.   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   JOINT REPLACEMENT  03/15/19   Accident. Broke left shoulder & wrist. Left shoulder replace   LASIK     MOUTH SURGERY  08/10/2017   front lower   OPEN REDUCTION INTERNAL FIXATION (ORIF) DISTAL RADIAL FRACTURE Left 03/15/2019   Procedure: OPEN REDUCTION INTERNAL FIXATION (ORIF)  DISTAL RADIAL FRACTURE;  Surgeon: Dominica Severin, MD;  Location: [facility] OR;  Service: Orthopedics;  Laterality: Left;   REFRACTIVE SURGERY Bilateral 2000   REVERSE SHOULDER ARTHROPLASTY Left 03/15/2019   Procedure: REVERSE SHOULDER ARTHROPLASTY;  Surgeon: Yolonda Kida, MD;  Location: [facility] OR;  Service: Orthopedics;  Laterality: Left;    ROTATOR CUFF REPAIR Right 2017   TONSILLECTOMY      Family History  Problem Relation Age of Onset   Heart disease Mother    Arthritis Mother    Heart disease Father    COPD Father    Colon polyps Father    Mental illness Brother         bipolar   Hypertension Brother    Suicidality Brother    Alcohol abuse Brother    Anxiety disorder Brother    Depression Brother    Heart disease Maternal Aunt    Varicose Veins Maternal Aunt    Heart disease Maternal Uncle    Varicose Veins Maternal Uncle    Heart disease Paternal Aunt    COPD Paternal Aunt    Heart disease Paternal Uncle    Heart disease Maternal Grandmother    Heart disease Maternal Grandfather    Heart disease Paternal Grandmother    Heart disease Paternal Grandfather    Colon cancer Neg Hx    Esophageal cancer Neg Hx    Stomach cancer Neg Hx    Rectal cancer Neg Hx     Social History   Socioeconomic History   Marital status: Single    Spouse name: Not on file   Number of children: Not on file   Years of education: Not on file   Highest education level: Not on file  Occupational History   Not on file  Tobacco Use   Smoking status: Never   Smokeless tobacco: Never  Vaping Use   Vaping status: Never Used  Substance and Sexual Activity   Alcohol use: Yes    Alcohol/week: 2.0 standard drinks of alcohol    Types: 2 Standard drinks or equivalent per week    Comment: 1-2 drinks per week   Drug use: Yes    Types: Marijuana    Comment: for nausea (every night)   Sexual activity: Not Currently    Comment: lives alone, retired from [employer], no dietary restrictions  Other Topics Concern   Not on file  Social History Narrative   Not on file   Social Drivers of Health   Financial Resource Strain: Low Risk  (04/24/2023)   Overall Financial Resource Strain (CARDIA)    Difficulty of Paying Living Expenses: Not very hard  Food Insecurity: No Food Insecurity (04/24/2023)   Hunger Vital Sign    Worried About Running Out of Food in the Last Year: Never true    Ran Out of Food in the Last Year: Never true  Transportation Needs: No Transportation Needs (04/24/2023)   PRAPARE - Administrator, Civil Service (Medical): No    Lack of  Transportation (Non-Medical): No  Physical Activity: Insufficiently Active (04/24/2023)   Exercise Vital Sign    Days of Exercise per Week: 2 days    Minutes of Exercise per Session: 30 min  Stress: No Stress Concern Present (04/24/2023)   [employer] of Occupational Health - Occupational Stress Questionnaire    Feeling of Stress : Only a little  Social Connections: Unknown (04/24/2023)   Social Connection and Isolation Panel [NHANES]    Frequency of Communication  with Friends and Family: Three times a week    Frequency of Social Gatherings with Friends and Family: Once a week    Attends Religious Services: Not on file    Active Member of Clubs or Organizations: Yes    Attends Banker Meetings: More than 4 times per year    Marital Status: Never married  Intimate Partner Violence: Not At Risk (05/01/2023)   Humiliation, Afraid, Rape, and Kick questionnaire    Fear of Current or Ex-Partner: No    Emotionally Abused: No    Physically Abused: No    Sexually Abused: No    Outpatient Medications Prior to Visit  Medication Sig Dispense Refill   buPROPion (WELLBUTRIN) 75 MG tablet Take 1 tablet (75 mg total) by mouth 2 (two) times daily. 180 tablet 1   cetirizine (ZYRTEC) 10 MG tablet Take 10 mg by mouth daily as needed for allergies.      clonazePAM (KLONOPIN) 1 MG tablet Take 0.5-1 tablets (0.5-1 mg total) by mouth 2 (two) times daily as needed for anxiety. 60 tablet 1   famotidine (PEPCID) 40 MG tablet Take 1 tablet by mouth twice daily 90 tablet 0   gabapentin (NEURONTIN) 300 MG capsule Take 300 mg by mouth 3 (three) times daily.     Melatonin-Pyridoxine (MELATONEX) 3-10 MG TBCR Take 1 tablet by mouth at bedtime.     mometasone (ELOCON) 0.1 % ointment Apply topically daily. As needed 15 g 2   mupirocin cream (BACTROBAN) 2 % Apply 1 application. topically 2 (two) times daily. 30 g 1   ondansetron (ZOFRAN) 4 MG tablet TAKE 1 TABLET BY MOUTH EVERY 8 HOURS AS NEEDED FOR  NAUSEA FOR VOMITING 20 tablet 0   Probiotic Product (PROBIOTIC PO) Take by mouth.     TURMERIC PO Take by mouth.     Vitamin D, Ergocalciferol, (DRISDOL) 1.25 MG (50000 UNIT) CAPS capsule Take 1 capsule (50,000 Units total) by mouth every 7 (seven) days. 12 capsule 0   fluticasone (FLONASE) 50 MCG/ACT nasal spray Place 2 sprays into both nostrils daily. (Patient taking differently: Place 2 sprays into both nostrils daily as needed.) 1 g 0   No facility-administered medications prior to visit.    Allergies  Allergen Reactions   Aspirin Anaphylaxis and Swelling    Swelling of lips and fingers    Celebrex [Celecoxib] Anaphylaxis, Hives and Shortness Of Breath   Doxycycline Other (See Comments)    Scarred throat    Prednisone Shortness Of Breath and Other (See Comments)    Chest tightness    Fosamax [Alendronate Sodium] Hives   Other Hives and Nausea Only    Arthritec   Amitriptyline     UNSPECIFIED REACTION    Chlorhexidine     UNSPECIFIED REACTION    Lactose Intolerance (Gi) Nausea Only   Celexa [Citalopram] Other (See Comments)    Shakiness    Clindamycin/Lincomycin Nausea And Vomiting   Tape Itching and Other (See Comments)    UNSPECIFIED REACTION  Paper tape is okay to use per patient    Review of Systems  Constitutional:  Positive for malaise/fatigue. Negative for fever.  HENT:  Negative for congestion.   Eyes:  Negative for blurred vision.  Respiratory:  Negative for shortness of breath.   Cardiovascular:  Negative for chest pain, palpitations and leg swelling.  Gastrointestinal:  Negative for abdominal pain, blood in stool and nausea.  Genitourinary:  Negative for dysuria and frequency.  Musculoskeletal:  Positive for joint pain  and myalgias. Negative for back pain and falls.  Skin:  Negative for rash.  Neurological:  Positive for focal weakness. Negative for dizziness, loss of consciousness and headaches.  Endo/Heme/Allergies:  Negative for environmental allergies.   Psychiatric/Behavioral:  Negative for depression. The patient is not nervous/anxious.       Objective:    Physical Exam  BP 134/83 Comment: Pt stated  Pulse 84 Comment: Pt stated  Ht 4\' 11"  (1.499 m) Comment: Pt stated  Wt 118 lb (53.5 kg) Comment: Pt stated  SpO2 94% Comment: Pt stated  BMI 23.83 kg/m  Wt Readings from Last 3 Encounters:  12/15/23 118 lb (53.5 kg)  05/01/23 122 lb 3.2 oz (55.4 kg)  03/16/23 122 lb (55.3 kg)       Assessment & Plan:  Complex regional pain syndrome type 1 of both upper extremities Assessment & Plan: She continues to struggle with daily debility but is managing well most days   Depression with anxiety Assessment & Plan: Stable on current meds, no changes   Osteopenia, unspecified location Assessment & Plan: Encouraged to get adequate exercise, calcium and vitamin d intake    Vitamin D deficiency Assessment & Plan: Supplement and monitor   Orders: -     VITAMIN D 25 Hydroxy (Vit-D Deficiency, Fractures)  Hyperglycemia -     Comprehensive metabolic panel; Future -     TSH; Future -     Hemoglobin A1c; Future  Mixed hyperlipidemia -     CBC with Differential/Platelet; Future -     Lipid panel; Future -     TSH; Future     Assessment and Plan    Grief Recent loss of family members leading to emotional distress. Currently seeing a grief counselor, Ellie McFalls, and reports improvement with Wellbutrin. -Continue grief counseling. -Continue Wellbutrin as prescribed.  Dietary changes due to lactose intolerance Recent transition to a vegan diet with some weight loss. Concerns about adequate protein and calcium intake. -Consider multivitamin and calcium citrate supplement. ideally consume calcium 1200 to 1500 mg daily -Ensure adequate protein intake every 3-4 hours and hydration every 1-2 hours. -Check calcium levels in next lab work.  Complex Regional Pain Syndrome (CRPS) Persistent numbness in hand and wrist, possibly  related to previous shoulder and wrist fractures. Concerns about potential aggravation of CRPS with nerve conduction test. -Consultation with hand specialist on 12/25/2023 to discuss potential nerve conduction test and risks associated with potential surgery.  Follow-up -Repeat labs in late February 2025. -Follow-up appointment in 2-3 months.         I discussed the assessment and treatment plan with the patient. The patient was provided an opportunity to ask questions and all were answered. The patient agreed with the plan and demonstrated an understanding of the instructions.   The patient was advised to call back or seek an in-person evaluation if the symptoms worsen or if the condition fails to improve as anticipated.  Danise Edge, MD [facility] at [facility] [phone] (phone) [phone] (fax)  [facility]

## 2023-12-16 ENCOUNTER — Other Ambulatory Visit: Payer: Self-pay | Admitting: Family Medicine

## 2023-12-16 MED ORDER — BUPROPION HCL ER (XL) 150 MG PO TB24: 150.0000 mg | ORAL_TABLET | Freq: Every day | ORAL | 3 refills | Status: DC

## 2023-12-17 ENCOUNTER — Telehealth: Payer: Medicare HMO | Admitting: Family Medicine

## 2023-12-17 DIAGNOSIS — M79602 Pain in left arm: Secondary | ICD-10-CM | POA: Diagnosis not present

## 2023-12-17 DIAGNOSIS — M792 Neuralgia and neuritis, unspecified: Secondary | ICD-10-CM | POA: Diagnosis not present

## 2023-12-18 NOTE — Addendum Note (Signed)
 Addended by: Marylou Sobers D on: 12/18/2023 10:05 AM   Modules accepted: Orders

## 2023-12-23 ENCOUNTER — Encounter: Payer: Self-pay | Admitting: Family Medicine

## 2023-12-23 ENCOUNTER — Telehealth: Payer: Self-pay | Admitting: Neurology

## 2023-12-23 ENCOUNTER — Other Ambulatory Visit (INDEPENDENT_AMBULATORY_CARE_PROVIDER_SITE_OTHER): Payer: Medicare HMO

## 2023-12-23 ENCOUNTER — Other Ambulatory Visit: Payer: Self-pay

## 2023-12-23 DIAGNOSIS — E782 Mixed hyperlipidemia: Secondary | ICD-10-CM | POA: Diagnosis not present

## 2023-12-23 DIAGNOSIS — E559 Vitamin D deficiency, unspecified: Secondary | ICD-10-CM

## 2023-12-23 DIAGNOSIS — R739 Hyperglycemia, unspecified: Secondary | ICD-10-CM

## 2023-12-23 LAB — LIPID PANEL
Cholesterol: 221 mg/dL — ABNORMAL HIGH (ref 0–200)
HDL: 79.4 mg/dL (ref >39.00)
LDL Cholesterol: 108 mg/dL — ABNORMAL HIGH (ref 0–99)
NonHDL: 142.03
Total CHOL/HDL Ratio: 3
Triglycerides: 170 mg/dL — ABNORMAL HIGH (ref 0.0–149.0)
VLDL: 34 mg/dL (ref 0.0–40.0)

## 2023-12-23 LAB — CBC WITH DIFFERENTIAL/PLATELET
Basophils Absolute: 0 10*3/uL (ref 0.0–0.1)
Basophils Relative: 0.7 % (ref 0.0–3.0)
Eosinophils Absolute: 0.1 10*3/uL (ref 0.0–0.7)
Eosinophils Relative: 1.5 % (ref 0.0–5.0)
HCT: 41.3 % (ref 36.0–46.0)
Hemoglobin: 13.6 g/dL (ref 12.0–15.0)
Lymphocytes Relative: 38.6 % (ref 12.0–46.0)
Lymphs Abs: 1.7 10*3/uL (ref 0.7–4.0)
MCHC: 32.9 g/dL (ref 30.0–36.0)
MCV: 93.1 fL (ref 78.0–100.0)
Monocytes Absolute: 0.4 10*3/uL (ref 0.1–1.0)
Monocytes Relative: 8.2 % (ref 3.0–12.0)
Neutro Abs: 2.2 10*3/uL (ref 1.4–7.7)
Neutrophils Relative %: 51 % (ref 43.0–77.0)
Platelets: 194 10*3/uL (ref 150.0–400.0)
RBC: 4.43 Mil/uL (ref 3.87–5.11)
RDW: 12.7 % (ref 11.5–15.5)
WBC: 4.4 10*3/uL (ref 4.0–10.5)

## 2023-12-23 LAB — COMPREHENSIVE METABOLIC PANEL
ALT: 27 U/L (ref 0–35)
AST: 25 U/L (ref 0–37)
Albumin: 4.3 g/dL (ref 3.5–5.2)
Alkaline Phosphatase: 130 U/L — ABNORMAL HIGH (ref 39–117)
BUN: 19 mg/dL (ref 6–23)
CO2: 32 meq/L (ref 19–32)
Calcium: 9.2 mg/dL (ref 8.4–10.5)
Chloride: 102 meq/L (ref 96–112)
Creatinine, Ser: 0.91 mg/dL (ref 0.40–1.20)
GFR: 62.62 mL/min (ref >60.00)
Glucose, Bld: 94 mg/dL (ref 70–99)
Potassium: 3.9 meq/L (ref 3.5–5.1)
Sodium: 140 meq/L (ref 135–145)
Total Bilirubin: 0.5 mg/dL (ref 0.2–1.2)
Total Protein: 6.8 g/dL (ref 6.0–8.3)

## 2023-12-23 LAB — TSH: TSH: 1.64 u[IU]/mL (ref 0.35–5.50)

## 2023-12-23 LAB — HEMOGLOBIN A1C: Hgb A1c MFr Bld: 5.4 % (ref 4.6–6.5)

## 2023-12-23 LAB — VITAMIN D 25 HYDROXY (VIT D DEFICIENCY, FRACTURES): VITD: >120 ng/mL

## 2023-12-23 NOTE — Telephone Encounter (Signed)
Called spoke pt was advised of Vitamin- D been high, stop all Vit D supplements. Pt mentation, no abd pain, Lab appt schedule for recheck Vit D in 4 weeks. Pt stated understand.

## 2023-12-23 NOTE — Telephone Encounter (Signed)
CRITICAL VALUE STICKER  CRITICAL VALUE: Vitamin D > 120  RECEIVER (on-site recipient of call): Mattelyn Imhoff  DATE & TIME NOTIFIED: 12/23/2023 3:53 pm  MESSENGER (representative from lab): Curley Spice  MD NOTIFIED: Carmelia Roller (DOD), copied to [facility]

## 2023-12-23 NOTE — Telephone Encounter (Signed)
Ca WNL. Stop all Vit D supplements. If she is asymptomatic (normal mentation, no abd pain), OK to recheck Vit D in 4 weeks. Ty.

## 2023-12-23 NOTE — Addendum Note (Signed)
Addended by: Radene Gunning on: 12/23/2023 03:59 PM   Modules accepted: Orders

## 2023-12-24 NOTE — Telephone Encounter (Signed)
Called and spoke with patient. She said provider had already contacted her on what to do next.

## 2024-01-11 ENCOUNTER — Other Ambulatory Visit: Payer: Self-pay | Admitting: Gastroenterology

## 2024-01-18 ENCOUNTER — Other Ambulatory Visit (INDEPENDENT_AMBULATORY_CARE_PROVIDER_SITE_OTHER): Payer: Medicare HMO

## 2024-01-18 DIAGNOSIS — E559 Vitamin D deficiency, unspecified: Secondary | ICD-10-CM

## 2024-01-18 LAB — VITAMIN D 25 HYDROXY (VIT D DEFICIENCY, FRACTURES): VITD: 95.99 ng/mL (ref 30.00–100.00)

## 2024-01-18 NOTE — Progress Notes (Signed)
 Pt requested weight check after labs.

## 2024-01-19 ENCOUNTER — Encounter: Payer: Self-pay | Admitting: Family Medicine

## 2024-03-05 ENCOUNTER — Other Ambulatory Visit: Payer: Self-pay | Admitting: Family Medicine

## 2024-03-08 ENCOUNTER — Telehealth: Payer: Self-pay

## 2024-03-08 NOTE — Telephone Encounter (Signed)
 Requesting: Clonazepam  1 mg Contract: N/A UDS: 10/22/2023 Last Visit: 01/18/2024 Next Visit: 04/14/2024 Last Refill: 10/06/2023  Please Advise

## 2024-03-08 NOTE — Telephone Encounter (Signed)
 Copied from CRM [id number]. Topic: Clinical - Prescription Issue >> Mar 08, 2024  2:53 PM Adaysia C wrote: Reason for CRM: Patients pharmacy([facility]) called in requesting additional documentation for the RX:clonazePAM  (KLONOPIN ) 1 MG tablet; please follow up with patients pharmacy: [facility] [address] [place], [place] - [address], [place] [address] Phone: [phone]  Fax: [phone]   [facility] call was returned and the pharmacist stated that insurance asked when was pt's last office visit, due she have UDS, next visit and contract. She was advised of the following information below: Requesting: Klonopin  1MG  Contract: No UDS: 10/22/23 Last Visit: 12/15/23 Next Visit: 04/14/24 Last Refill: 03/08/24  Please Advise

## 2024-03-24 DIAGNOSIS — M1712 Unilateral primary osteoarthritis, left knee: Secondary | ICD-10-CM | POA: Diagnosis not present

## 2024-04-10 NOTE — Assessment & Plan Note (Signed)
 Encourage heart healthy diet such as MIND or DASH diet, increase exercise, avoid trans fats, simple carbohydrates and processed foods, consider a krill or fish or flaxseed oil cap daily.

## 2024-04-10 NOTE — Assessment & Plan Note (Signed)
 Supplement and monitor

## 2024-04-10 NOTE — Assessment & Plan Note (Signed)
 Encouraged to get adequate exercise, calcium and vitamin d intake

## 2024-04-10 NOTE — Assessment & Plan Note (Addendum)
 Patient encouraged to maintain heart healthy diet, regular exercise, adequate sleep. Consider daily probiotics. Take medications as prescribed. Labs ordered and reviewed. Immunizations up to date. Eye exam on 01/02/21. Dexa scan 2022. MGM 11/2022 repeat by 2026, and pap 2020 and colonoscopy 2022 repeat in 3 years

## 2024-04-13 ENCOUNTER — Encounter: Payer: Self-pay | Admitting: Family Medicine

## 2024-04-14 ENCOUNTER — Encounter: Payer: Self-pay | Admitting: Family Medicine

## 2024-04-14 ENCOUNTER — Other Ambulatory Visit: Payer: Self-pay | Admitting: Family Medicine

## 2024-04-14 ENCOUNTER — Ambulatory Visit (INDEPENDENT_AMBULATORY_CARE_PROVIDER_SITE_OTHER): Payer: Medicare PPO | Admitting: Family Medicine

## 2024-04-14 VITALS — BP 126/88 | HR 75 | Resp 16 | Ht 59.0 in | Wt 112.2 lb

## 2024-04-14 DIAGNOSIS — E559 Vitamin D deficiency, unspecified: Secondary | ICD-10-CM | POA: Diagnosis not present

## 2024-04-14 DIAGNOSIS — R739 Hyperglycemia, unspecified: Secondary | ICD-10-CM

## 2024-04-14 DIAGNOSIS — Z0000 Encounter for general adult medical examination without abnormal findings: Secondary | ICD-10-CM

## 2024-04-14 DIAGNOSIS — G90513 Complex regional pain syndrome I of upper limb, bilateral: Secondary | ICD-10-CM

## 2024-04-14 DIAGNOSIS — E782 Mixed hyperlipidemia: Secondary | ICD-10-CM

## 2024-04-14 DIAGNOSIS — D649 Anemia, unspecified: Secondary | ICD-10-CM

## 2024-04-14 DIAGNOSIS — M8580 Other specified disorders of bone density and structure, unspecified site: Secondary | ICD-10-CM | POA: Diagnosis not present

## 2024-04-14 DIAGNOSIS — F418 Other specified anxiety disorders: Secondary | ICD-10-CM | POA: Diagnosis not present

## 2024-04-14 MED ORDER — BUPROPION HCL 75 MG PO TABS: 75.0000 mg | ORAL_TABLET | Freq: Every day | ORAL | 2 refills | Status: DC

## 2024-04-14 MED ORDER — BUPROPION HCL ER (SR) 100 MG PO TB12: 100.0000 mg | ORAL_TABLET | Freq: Every day | ORAL | 1 refills | Status: DC

## 2024-04-14 NOTE — Assessment & Plan Note (Signed)
 Will drop the Wellbutrin  to 100 mg daily and then to 75 mg daily

## 2024-04-14 NOTE — Assessment & Plan Note (Signed)
 She is stable and even improved some has been able to golf some

## 2024-04-14 NOTE — Patient Instructions (Signed)
 Preventive Care 43 Years and Older, Female Preventive care refers to lifestyle choices and visits with your health care provider that can promote health and wellness. Preventive care visits are also called wellness exams. What can I expect for my preventive care visit? Counseling Your health care provider may ask you questions about your: Medical history, including: Past medical problems. Family medical history. Pregnancy and menstrual history. History of falls. Current health, including: Memory and ability to understand (cognition). Emotional well-being. Home life and relationship well-being. Sexual activity and sexual health. Lifestyle, including: Alcohol, nicotine or tobacco, and drug use. Access to firearms. Diet, exercise, and sleep habits. Work and work Astronomer. Sunscreen use. Safety issues such as seatbelt and bike helmet use. Physical exam Your health care provider will check your: Height and weight. These may be used to calculate your BMI (body mass index). BMI is a measurement that tells if you are at a healthy weight. Waist circumference. This measures the distance around your waistline. This measurement also tells if you are at a healthy weight and may help predict your risk of certain diseases, such as type 2 diabetes and high blood pressure. Heart rate and blood pressure. Body temperature. Skin for abnormal spots. What immunizations do I need?  Vaccines are usually given at various ages, according to a schedule. Your health care provider will recommend vaccines for you based on your age, medical history, and lifestyle or other factors, such as travel or where you work. What tests do I need? Screening Your health care provider may recommend screening tests for certain conditions. This may include: Lipid and cholesterol levels. Hepatitis C test. Hepatitis B test. HIV (human immunodeficiency virus) test. STI (sexually transmitted infection) testing, if you are at  risk. Lung cancer screening. Colorectal cancer screening. Diabetes screening. This is done by checking your blood sugar (glucose) after you have not eaten for a while (fasting). Mammogram. Talk with your health care provider about how often you should have regular mammograms. BRCA-related cancer screening. This may be done if you have a family history of breast, ovarian, tubal, or peritoneal cancers. Bone density scan. This is done to screen for osteoporosis. Talk with your health care provider about your test results, treatment options, and if necessary, the need for more tests. Follow these instructions at home: Eating and drinking  Eat a diet that includes fresh fruits and vegetables, whole grains, lean protein, and low-fat dairy products. Limit your intake of foods with high amounts of sugar, saturated fats, and salt. Take vitamin and mineral supplements as recommended by your health care provider. Do not drink alcohol if your health care provider tells you not to drink. If you drink alcohol: Limit how much you have to 0-1 drink a day. Know how much alcohol is in your drink. In the [place] one drink equals one 12 oz bottle of beer (355 mL), one 5 oz glass of wine (148 mL), or one 1 oz glass of hard liquor (44 mL). Lifestyle Brush your teeth every morning and night with fluoride toothpaste. Floss one time each day. Exercise for at least 30 minutes 5 or more days each week. Do not use any products that contain nicotine or tobacco. These products include cigarettes, chewing tobacco, and vaping devices, such as e-cigarettes. If you need help quitting, ask your health care provider. Do not use drugs. If you are sexually active, practice safe sex. Use a condom or other form of protection in order to prevent STIs. Take aspirin only as told by  your health care provider. Make sure that you understand how much to take and what form to take. Work with your health care provider to find out whether it  is safe and beneficial for you to take aspirin daily. Ask your health care provider if you need to take a cholesterol-lowering medicine (statin). Find healthy ways to manage stress, such as: Meditation, yoga, or listening to music. Journaling. Talking to a trusted person. Spending time with friends and family. Minimize exposure to UV radiation to reduce your risk of skin cancer. Safety Always wear your seat belt while driving or riding in a vehicle. Do not drive: If you have been drinking alcohol. Do not ride with someone who has been drinking. When you are tired or distracted. While texting. If you have been using any mind-altering substances or drugs. Wear a helmet and other protective equipment during sports activities. If you have firearms in your house, make sure you follow all gun safety procedures. What's next? Visit your health care provider once a year for an annual wellness visit. Ask your health care provider how often you should have your eyes and teeth checked. Stay up to date on all vaccines. This information is not intended to replace advice given to you by your health care provider. Make sure you discuss any questions you have with your health care provider. Document Revised: 04/24/2021 Document Reviewed: 04/24/2021 Elsevier Patient Education  2024 [employer].

## 2024-04-14 NOTE — Progress Notes (Signed)
 Subjective:    Patient ID: Stephanie Barnes, female    DOB: 08/31/1951, 73 y.o.   MRN: [id number]  Chief Complaint  Patient presents with   Annual Exam    Patient presents today for a physical exam.   Quality Metric Gaps    AWV, colonoscopy     HPI Discussed the use of AI scribe software for clinical note transcription with the patient, who gave verbal consent to proceed.  History of Present Illness Stephanie Barnes is a 74 year old female who presents for a follow-up visit to discuss medication management and recent health changes.  She has been experiencing involuntary leg movements while sitting in her recliner over the past three weeks. She describes this as a sensation of not being able to stop her feet, which she associates with her medication being 'too strong'.  She has a history of lactose intolerance, diagnosed after experiencing constant nausea for six to eight months. Since the diagnosis, she has been reading labels and adjusting her diet to avoid lactose, which has improved her symptoms. Her weight has fluctuated, with a recent low of 107 pounds, but she is now stable at 112 pounds. She attributes the weight changes to dietary adjustments and increased physical activity, including golfing.  She manages her calcium  and vitamin D  levels, noting fluctuations in her vitamin D  levels. She alternates her calcium  intake between one and two tablets daily to maintain balance. She consumes watermelon daily, which she believes helps with hydration, as she is not a heavy water drinker. She drinks water with meals and keeps it accessible throughout the day.  She experiences easy bruising, similar to her father's condition, and is concerned about the frequency and ease with which she bruises.  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 She is still seeking additional employment  opportunities. Her family history includes the recent death of her brother, Rodman Clam, which has impacted her and her family significantly. She has been supporting her nieces and nephew through this time, creating photo albums to help them remember their father.    Past Medical History:  Diagnosis Date   Abnormal EKG 06/13/2013   Allergy 1991   See medical file for list.   Anemia 01/13/2016   Anxiety    Arthritis    Cataract 2/24   Have had cataracts removed from both eyes this year.   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   JOINT REPLACEMENT  03/15/19   Accident. Broke left shoulder & wrist. Left shoulder replace   LASIK     MOUTH SURGERY  08/10/2017   front lower   OPEN REDUCTION INTERNAL FIXATION (ORIF)  DISTAL RADIAL FRACTURE Left 03/15/2019   Procedure: OPEN REDUCTION INTERNAL FIXATION (ORIF) DISTAL RADIAL FRACTURE;  Surgeon: Ronn Cohn, MD;  Location: [facility] OR;  Service: Orthopedics;  Laterality: Left;   REFRACTIVE SURGERY Bilateral 2000   REVERSE SHOULDER ARTHROPLASTY Left 03/15/2019   Procedure: REVERSE SHOULDER ARTHROPLASTY;  Surgeon: Janeth Medicus, MD;  Location: [facility];  Service: Orthopedics;  Laterality: Left;    ROTATOR CUFF REPAIR Right 2017   TONSILLECTOMY      Family History  Problem Relation Age of Onset   Heart  disease Mother    Arthritis Mother    Heart disease Father    COPD Father    Colon polyps Father    Mental illness Brother        bipolar   Hypertension Brother    Suicidality Brother    Alcohol abuse Brother    Anxiety disorder Brother    Depression Brother    Heart disease Maternal Aunt    Varicose Veins Maternal Aunt    Heart disease Maternal Uncle    Varicose Veins Maternal Uncle    Heart disease Paternal Aunt    COPD Paternal Aunt    Heart disease Paternal Uncle    Heart disease Maternal Grandmother    Heart disease Maternal Grandfather    Heart disease Paternal Grandmother    Heart disease Paternal Grandfather    Colon cancer Neg Hx    Esophageal cancer Neg Hx    Stomach cancer Neg Hx    Rectal cancer Neg Hx     Social History   Socioeconomic History   Marital status: Single    Spouse name: Not on file   Number of children: Not on file   Years of education: Not on file   Highest education level: Not on file  Occupational History   Not on file  Tobacco Use   Smoking status: Never   Smokeless tobacco: Never  Vaping Use   Vaping status: Never Used  Substance and Sexual Activity   Alcohol use: Yes    Alcohol/week: 2.0 standard drinks of alcohol    Types: 2 Standard drinks or equivalent per week    Comment: 1-2 drinks per week   Drug use: Yes    Types: Marijuana    Comment: for nausea (every night)   Sexual activity: Not Currently    Comment: lives alone, retired from [employer], no dietary restrictions  Other Topics Concern   Not on file  Social History Narrative   Not on file   Social Drivers of Health   Financial Resource Strain: Low Risk  (04/24/2023)   Overall Financial Resource Strain (CARDIA)    Difficulty of Paying Living Expenses: Not very hard  Food Insecurity: No Food Insecurity (04/24/2023)   Hunger Vital Sign    Worried About Running Out of Food in the Last Year: Never true    Ran Out of Food in the Last Year: Never true   Transportation Needs: No Transportation Needs (04/24/2023)   PRAPARE - Administrator, Civil Service (Medical): No    Lack of Transportation (Non-Medical): No  Physical Activity: Insufficiently Active (04/24/2023)   Exercise Vital Sign    Days of Exercise per Week: 2 days    Minutes of Exercise per Session: 30 min  Stress: No Stress Concern Present (04/24/2023)   [employer] of Occupational Health - Occupational Stress Questionnaire    Feeling of Stress : Only a little  Social Connections: Unknown (04/24/2023)  Social Connection and Isolation Panel [NHANES]    Frequency of Communication with Friends and Family: Three times a week    Frequency of Social Gatherings with Friends and Family: Once a week    Attends Religious Services: Not on file    Active Member of Clubs or Organizations: Yes    Attends Banker Meetings: More than 4 times per year    Marital Status: Never married  Intimate Partner Violence: Not At Risk (05/01/2023)   Humiliation, Afraid, Rape, and Kick questionnaire    Fear of Current or Ex-Partner: No    Emotionally Abused: No    Physically Abused: No    Sexually Abused: No    Outpatient Medications Prior to Visit  Medication Sig Dispense Refill   buPROPion  ER (WELLBUTRIN  SR) 100 MG 12 hr tablet Take 1 tablet (100 mg total) by mouth daily. 30 tablet 1   cetirizine (ZYRTEC) 10 MG tablet Take 10 mg by mouth daily as needed for allergies.      clonazePAM  (KLONOPIN ) 1 MG tablet TAKE 1/2 TO 1 (ONE-HALF TO ONE) TABLET BY MOUTH TWICE DAILY AS NEEDED FOR ANXIETY 60 tablet 0   famotidine  (PEPCID ) 40 MG tablet Take 1 tablet by mouth twice daily 90 tablet 0   gabapentin  (NEURONTIN ) 300 MG capsule Take 300 mg by mouth 3 (three) times daily.     Melatonin-Pyridoxine (MELATONEX) 3-10 MG TBCR Take 1 tablet by mouth at bedtime.     mometasone  (ELOCON ) 0.1 % ointment Apply topically daily. As needed 15 g 2   mupirocin  cream (BACTROBAN ) 2 % Apply 1  application. topically 2 (two) times daily. 30 g 1   ondansetron  (ZOFRAN ) 4 MG tablet TAKE 1 TABLET BY MOUTH EVERY 8 HOURS AS NEEDED FOR NAUSEA FOR VOMITING 20 tablet 0   Probiotic Product (PROBIOTIC PO) Take by mouth.     TURMERIC PO Take by mouth.     buPROPion  (WELLBUTRIN ) 75 MG tablet Take 1 tablet (75 mg total) by mouth daily. (Patient not taking: Reported on 04/14/2024) 30 tablet 2   fluticasone  (FLONASE ) 50 MCG/ACT nasal spray Place 2 sprays into both nostrils daily. (Patient taking differently: Place 2 sprays into both nostrils daily as needed.) 1 g 0   No facility-administered medications prior to visit.    Allergies  Allergen Reactions   Aspirin Anaphylaxis and Swelling    Swelling of lips and fingers    Celebrex [Celecoxib] Anaphylaxis, Hives and Shortness Of Breath   Doxycycline Other (See Comments)    Scarred throat    Prednisone Shortness Of Breath and Other (See Comments)    Chest tightness    Fosamax [Alendronate Sodium] Hives   Other Hives and Nausea Only    Arthritec   Amitriptyline     UNSPECIFIED REACTION    Chlorhexidine      UNSPECIFIED REACTION    Lactose Intolerance (Gi) Nausea Only   Celexa [Citalopram] Other (See Comments)    Shakiness    Clindamycin/Lincomycin Nausea And Vomiting   Tape Itching and Other (See Comments)    UNSPECIFIED REACTION  Paper tape is okay to use per patient    Review of Systems  Constitutional:  Negative for chills, fever and malaise/fatigue.  HENT:  Negative for congestion and hearing loss.   Eyes:  Negative for discharge.  Respiratory:  Negative for cough, sputum production and shortness of breath.   Cardiovascular:  Negative for chest pain, palpitations and leg swelling.  Gastrointestinal:  Negative for abdominal pain, blood in stool,  constipation, diarrhea, heartburn, nausea and vomiting.  Genitourinary:  Negative for dysuria, frequency, hematuria and urgency.  Musculoskeletal:  Positive for joint pain and myalgias.  Negative for back pain and falls.  Skin:  Negative for rash.  Neurological:  Negative for dizziness, sensory change, loss of consciousness, weakness and headaches.  Endo/Heme/Allergies:  Negative for environmental allergies. Does not bruise/bleed easily.  Psychiatric/Behavioral:  Positive for depression. Negative for suicidal ideas. The patient is nervous/anxious. The patient does not have insomnia.        Objective:     Physical Exam Constitutional:      General: She is not in acute distress.    Appearance: Normal appearance. She is not diaphoretic.  HENT:     Head: Normocephalic and atraumatic.     Right Ear: Tympanic membrane, ear canal and external ear normal.     Left Ear: Tympanic membrane, ear canal and external ear normal.     Nose: Nose normal.     Mouth/Throat:     Mouth: Mucous membranes are moist.     Pharynx: Oropharynx is clear. No oropharyngeal exudate.  Eyes:     General: No scleral icterus.       Right eye: No discharge.        Left eye: No discharge.     Conjunctiva/sclera: Conjunctivae normal.     Pupils: Pupils are equal, round, and reactive to light.  Neck:     Thyroid : No thyromegaly.  Cardiovascular:     Rate and Rhythm: Normal rate and regular rhythm.     Heart sounds: Normal heart sounds. No murmur heard. Pulmonary:     Effort: Pulmonary effort is normal. No respiratory distress.     Breath sounds: Normal breath sounds. No wheezing or rales.  Abdominal:     General: Bowel sounds are normal. There is no distension.     Palpations: Abdomen is soft. There is no mass.     Tenderness: There is no abdominal tenderness.  Musculoskeletal:        General: No tenderness. Normal range of motion.     Cervical back: Normal range of motion and neck supple.  Lymphadenopathy:     Cervical: No cervical adenopathy.  Skin:    General: Skin is warm and dry.     Findings: No rash.  Neurological:     General: No focal deficit present.     Mental Status: She is  alert and oriented to person, place, and time.     Cranial Nerves: No cranial nerve deficit.     Coordination: Coordination normal.     Deep Tendon Reflexes: Reflexes are normal and symmetric. Reflexes normal.  Psychiatric:        Mood and Affect: Mood normal.        Behavior: Behavior normal.        Thought Content: Thought content normal.        Judgment: Judgment normal.     BP 126/88   Pulse 75   Resp 16   Ht 4\' 11"  (1.499 m)   Wt 112 lb 3.2 oz (50.9 kg)   SpO2 96%   BMI 22.66 kg/m  Wt Readings from Last 3 Encounters:  04/14/24 112 lb 3.2 oz (50.9 kg)  01/18/24 114 lb (51.7 kg)  12/15/23 118 lb (53.5 kg)    Diabetic Foot Exam - Simple   No data filed    Lab Results  Component Value Date   WBC 4.4 12/23/2023   HGB 13.6 12/23/2023  HCT 41.3 12/23/2023   PLT 194.0 12/23/2023   GLUCOSE 94 12/23/2023   CHOL 221 (H) 12/23/2023   TRIG 170.0 (H) 12/23/2023   HDL 79.40 12/23/2023   LDLCALC 108 (H) 12/23/2023   ALT 27 12/23/2023   AST 25 12/23/2023   NA 140 12/23/2023   K 3.9 12/23/2023   CL 102 12/23/2023   CREATININE 0.91 12/23/2023   BUN 19 12/23/2023   CO2 32 12/23/2023   TSH 1.64 12/23/2023   HGBA1C 5.4 12/23/2023    Lab Results  Component Value Date   TSH 1.64 12/23/2023   Lab Results  Component Value Date   WBC 4.4 12/23/2023   HGB 13.6 12/23/2023   HCT 41.3 12/23/2023   MCV 93.1 12/23/2023   PLT 194.0 12/23/2023   Lab Results  Component Value Date   NA 140 12/23/2023   K 3.9 12/23/2023   CO2 32 12/23/2023   GLUCOSE 94 12/23/2023   BUN 19 12/23/2023   CREATININE 0.91 12/23/2023   BILITOT 0.5 12/23/2023   ALKPHOS 130 (H) 12/23/2023   AST 25 12/23/2023   ALT 27 12/23/2023   PROT 6.8 12/23/2023   ALBUMIN 4.3 12/23/2023   CALCIUM  9.2 12/23/2023   ANIONGAP 14 03/10/2019   GFR 62.62 12/23/2023   Lab Results  Component Value Date   CHOL 221 (H) 12/23/2023   Lab Results  Component Value Date   HDL 79.40 12/23/2023   Lab Results   Component Value Date   LDLCALC 108 (H) 12/23/2023   Lab Results  Component Value Date   TRIG 170.0 (H) 12/23/2023   Lab Results  Component Value Date   CHOLHDL 3 12/23/2023   Lab Results  Component Value Date   HGBA1C 5.4 12/23/2023       Assessment & Plan:  Mixed hyperlipidemia Assessment & Plan: Encourage heart healthy diet such as MIND or DASH diet, increase exercise, avoid trans fats, simple carbohydrates and processed foods, consider a krill or fish or flaxseed oil cap daily.    Orders: -     Lipid panel -     Comprehensive metabolic panel with GFR -     TSH  Vitamin D  deficiency Assessment & Plan: Supplement and monitor   Orders: -     VITAMIN D  25 Hydroxy (Vit-D Deficiency, Fractures)  Osteopenia, unspecified location Assessment & Plan: Encouraged to get adequate exercise, calcium  and vitamin d  intake   Orders: -     TSH  Preventative health care Assessment & Plan: Patient encouraged to maintain heart healthy diet, regular exercise, adequate sleep. Consider daily probiotics. Take medications as prescribed. Labs ordered and reviewed. Immunizations up to date. Eye exam on 01/02/21. Dexa scan 2022. MGM 11/2022 repeat by 2026, and pap 2020 and colonoscopy 2022 repeat in 3 years   Hyperglycemia -     Hemoglobin A1c -     TSH  Anemia, unspecified type -     CBC with Differential/Platelet  Depression with anxiety Assessment & Plan: Will drop the Wellbutrin  to 100 mg daily and then to 75 mg daily   Complex regional pain syndrome type 1 of both upper extremities Assessment & Plan: She is stable and even improved some has been able to golf some     Assessment and Plan Assessment & Plan       Randie Bustle, MD

## 2024-04-15 ENCOUNTER — Encounter: Payer: Self-pay | Admitting: Family Medicine

## 2024-04-15 LAB — LIPID PANEL
Cholesterol: 232 mg/dL — ABNORMAL HIGH (ref 0–200)
HDL: 93 mg/dL (ref >39.00)
LDL Cholesterol: 80 mg/dL (ref 0–99)
NonHDL: 138.79
Total CHOL/HDL Ratio: 2
Triglycerides: 296 mg/dL — ABNORMAL HIGH (ref 0.0–149.0)
VLDL: 59.2 mg/dL — ABNORMAL HIGH (ref 0.0–40.0)

## 2024-04-15 LAB — VITAMIN D 25 HYDROXY (VIT D DEFICIENCY, FRACTURES): VITD: 67.81 ng/mL (ref 30.00–100.00)

## 2024-04-15 LAB — COMPREHENSIVE METABOLIC PANEL WITH GFR
ALT: 44 U/L — ABNORMAL HIGH (ref 0–35)
AST: 34 U/L (ref 0–37)
Albumin: 4.4 g/dL (ref 3.5–5.2)
Alkaline Phosphatase: 145 U/L — ABNORMAL HIGH (ref 39–117)
BUN: 16 mg/dL (ref 6–23)
CO2: 33 meq/L — ABNORMAL HIGH (ref 19–32)
Calcium: 9.5 mg/dL (ref 8.4–10.5)
Chloride: 100 meq/L (ref 96–112)
Creatinine, Ser: 1 mg/dL (ref 0.40–1.20)
GFR: 55.8 mL/min — ABNORMAL LOW (ref >60.00)
Glucose, Bld: 87 mg/dL (ref 70–99)
Potassium: 4.7 meq/L (ref 3.5–5.1)
Sodium: 139 meq/L (ref 135–145)
Total Bilirubin: 0.4 mg/dL (ref 0.2–1.2)
Total Protein: 6.8 g/dL (ref 6.0–8.3)

## 2024-04-15 LAB — CBC WITH DIFFERENTIAL/PLATELET
Basophils Absolute: 0 10*3/uL (ref 0.0–0.1)
Basophils Relative: 0.7 % (ref 0.0–3.0)
Eosinophils Absolute: 0.1 10*3/uL (ref 0.0–0.7)
Eosinophils Relative: 2.3 % (ref 0.0–5.0)
HCT: 40.5 % (ref 36.0–46.0)
Hemoglobin: 13.6 g/dL (ref 12.0–15.0)
Lymphocytes Relative: 35.8 % (ref 12.0–46.0)
Lymphs Abs: 1.6 10*3/uL (ref 0.7–4.0)
MCHC: 33.5 g/dL (ref 30.0–36.0)
MCV: 93 fl (ref 78.0–100.0)
Monocytes Absolute: 0.4 10*3/uL (ref 0.1–1.0)
Monocytes Relative: 8.6 % (ref 3.0–12.0)
Neutro Abs: 2.4 10*3/uL (ref 1.4–7.7)
Neutrophils Relative %: 52.6 % (ref 43.0–77.0)
Platelets: 179 10*3/uL (ref 150.0–400.0)
RBC: 4.35 Mil/uL (ref 3.87–5.11)
RDW: 12.9 % (ref 11.5–15.5)
WBC: 4.6 10*3/uL (ref 4.0–10.5)

## 2024-04-15 LAB — HEMOGLOBIN A1C: Hgb A1c MFr Bld: 5.4 % (ref 4.6–6.5)

## 2024-04-21 ENCOUNTER — Ambulatory Visit: Payer: Self-pay | Admitting: Family Medicine

## 2024-04-21 DIAGNOSIS — E782 Mixed hyperlipidemia: Secondary | ICD-10-CM

## 2024-04-21 LAB — TSH: TSH: 1.77 u[IU]/mL (ref 0.35–5.50)

## 2024-04-22 MED ORDER — ATORVASTATIN CALCIUM 10 MG PO TABS: 10.0000 mg | ORAL_TABLET | Freq: Every day | ORAL | 1 refills | Status: AC

## 2024-04-23 ENCOUNTER — Encounter: Payer: Self-pay | Admitting: Family Medicine

## 2024-04-26 ENCOUNTER — Encounter: Payer: Self-pay | Admitting: Family Medicine

## 2024-05-03 ENCOUNTER — Other Ambulatory Visit: Payer: Self-pay | Admitting: Family Medicine

## 2024-05-03 ENCOUNTER — Ambulatory Visit: Payer: Medicare PPO

## 2024-05-03 ENCOUNTER — Telehealth: Payer: Self-pay | Admitting: *Deleted

## 2024-05-03 VITALS — BP 127/73 | HR 76 | Temp 98.4°F | Resp 16 | Ht 59.75 in | Wt 110.2 lb

## 2024-05-03 DIAGNOSIS — Z1231 Encounter for screening mammogram for malignant neoplasm of breast: Secondary | ICD-10-CM

## 2024-05-03 DIAGNOSIS — Z604 Social exclusion and rejection: Secondary | ICD-10-CM | POA: Diagnosis not present

## 2024-05-03 DIAGNOSIS — Z5986 Financial insecurity: Secondary | ICD-10-CM

## 2024-05-03 DIAGNOSIS — Z0000 Encounter for general adult medical examination without abnormal findings: Secondary | ICD-10-CM

## 2024-05-03 MED ORDER — BUPROPION HCL ER (XL) 150 MG PO TB24: 150.0000 mg | ORAL_TABLET | Freq: Every day | ORAL | 3 refills | Status: DC

## 2024-05-03 NOTE — Progress Notes (Unsigned)
 Subjective:   Stephanie Barnes is a 73 y.o. who presents for a Medicare Wellness preventive visit.  As a reminder, Annual Wellness Visits don't include a physical exam, and some assessments may be limited, especially if this visit is performed virtually. We may recommend an in-person follow-up visit with your provider if needed.  Visit Complete: {VISITMETHODVS:[phone]}  {AWVVIDEO:32072}  Persons Participating in Visit: {Persons Participating in Visit:32444}  AWV Questionnaire: {AWVQuestionnaire:32338}        Objective:    Today's Vitals   05/03/24 1330  BP: 127/73  Pulse: 76  Resp: 16  Temp: 98.4 F (36.9 C)  TempSrc: Oral  SpO2: 97%  Weight: 110 lb 3.2 oz (50 kg)  Height: 4' 11.75 (1.518 m)   Body mass index is 21.7 kg/m.     05/03/2024    4:16 PM 05/01/2023    2:37 PM 04/29/2022    2:31 PM 04/24/2021    3:04 PM 08/12/2019    1:10 PM 03/15/2019    6:00 PM 03/10/2019    2:19 PM  Advanced Directives  Does Patient Have a Medical Advance Directive? Yes Yes Yes Yes Yes Yes Yes  Type of Estate agent of [employer] Power of [place];Living will Healthcare Power of [place];Out of facility DNR (pink MOST or yellow form);Living will Healthcare Power of [place];Living will Healthcare Power of [place];Living will Healthcare Power of [place];Living will Healthcare Power of [place];Living will  Does patient want to make changes to medical advance directive? Yes (MAU/Ambulatory/Procedural Areas - Information given) No - Patient declined No - Patient declined  No - Patient declined No - Patient declined  No - Patient declined   Copy of Healthcare Power of Attorney in Chart? No - copy requested Yes - validated most recent copy scanned in chart (See row information) Yes - validated most recent copy scanned in chart (See row information) Yes - validated most recent copy scanned in chart (See row information) Yes - validated most recent copy scanned in  chart (See row information)  No - copy requested      Data saved with a previous flowsheet row definition    Current Medications (verified) Outpatient Encounter Medications as of 05/03/2024  Medication Sig   atorvastatin  (LIPITOR) 10 MG tablet Take 1 tablet (10 mg total) by mouth daily.   buPROPion  ER (WELLBUTRIN  SR) 100 MG 12 hr tablet Take 1 tablet (100 mg total) by mouth daily.   cetirizine (ZYRTEC) 10 MG tablet Take 10 mg by mouth daily as needed for allergies.    clonazePAM  (KLONOPIN ) 1 MG tablet TAKE 1/2 TO 1 (ONE-HALF TO ONE) TABLET BY MOUTH TWICE DAILY AS NEEDED FOR ANXIETY   famotidine  (PEPCID ) 40 MG tablet Take 1 tablet by mouth twice daily   fluticasone  (FLONASE ) 50 MCG/ACT nasal spray Place 2 sprays into both nostrils daily.   gabapentin  (NEURONTIN ) 300 MG capsule Take 300 mg by mouth 3 (three) times daily.   Melatonin-Pyridoxine (MELATONEX) 3-10 MG TBCR Take 1 tablet by mouth at bedtime.   mometasone  (ELOCON ) 0.1 % ointment Apply topically daily. As needed   mupirocin  cream (BACTROBAN ) 2 % Apply 1 application. topically 2 (two) times daily.   ondansetron  (ZOFRAN ) 4 MG tablet TAKE 1 TABLET BY MOUTH EVERY 8 HOURS AS NEEDED FOR NAUSEA FOR VOMITING   Probiotic Product (PROBIOTIC PO) Take by mouth.   TURMERIC PO Take by mouth.   buPROPion  (WELLBUTRIN ) 75 MG tablet Take 1 tablet (75 mg total) by mouth daily. (Patient not taking:  Reported on 05/03/2024)   No facility-administered encounter medications on file as of 05/03/2024.    Allergies (verified) Aspirin, Celebrex [celecoxib], Doxycycline, Prednisone, Fosamax [alendronate sodium], Other, Amitriptyline, Chlorhexidine , Lactose intolerance (gi), Celexa [citalopram], Clindamycin/lincomycin, and Tape   History: Past Medical History:  Diagnosis Date   Abnormal EKG 06/13/2013   Allergy 1991   See medical file for list.   Anemia 01/13/2016   Anxiety    Arthritis    Cataract 2/24   Have had cataracts removed from both eyes this  year.   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   JOINT REPLACEMENT  03/15/19   Accident. Broke left shoulder & wrist. Left shoulder replace   LASIK     MOUTH SURGERY  08/10/2017   front lower   OPEN REDUCTION INTERNAL FIXATION (ORIF) DISTAL RADIAL FRACTURE Left 03/15/2019   Procedure: OPEN REDUCTION INTERNAL FIXATION (ORIF) DISTAL RADIAL FRACTURE;  Surgeon: Camella Fallow, MD;  Location: [facility] OR;  Service: Orthopedics;  Laterality: Left;   REFRACTIVE SURGERY Bilateral 2000   REVERSE SHOULDER ARTHROPLASTY Left 03/15/2019   Procedure: REVERSE SHOULDER ARTHROPLASTY;  Surgeon: Sharl Selinda Dover, MD;  Location: [facility] OR;  Service: Orthopedics;  Laterality: Left;    ROTATOR CUFF REPAIR Right 2017   TONSILLECTOMY     Family History  Problem Relation Age of Onset   Heart disease Mother    Arthritis Mother    Heart disease Father    COPD Father    Colon polyps Father    Mental illness Brother        bipolar   Hypertension Brother    Suicidality Brother    Alcohol abuse Brother    Anxiety disorder Brother    Depression Brother    Heart disease Maternal Aunt    Varicose Veins Maternal Aunt    Heart disease Maternal Uncle    Varicose Veins Maternal Uncle    Heart disease Paternal Aunt    COPD Paternal Aunt    Heart disease Paternal Uncle    Heart  disease Maternal Grandmother    Heart disease Maternal Grandfather    Heart disease Paternal Grandmother    Heart disease Paternal Grandfather    Colon cancer Neg Hx    Esophageal cancer Neg Hx    Stomach cancer Neg Hx    Rectal cancer Neg Hx    Social History   Socioeconomic History   Marital status: Single    Spouse name: Not on file   Number of children: Not on file   Years of education: Not on file   Highest education level: Not on file  Occupational History   Not on file  Tobacco Use   Smoking status: Never   Smokeless tobacco: Never  Vaping Use   Vaping status:  Never Used  Substance and Sexual Activity   Alcohol use: Yes    Alcohol/week: 2.0 standard drinks of alcohol    Types: 2 Standard drinks or equivalent per week    Comment: 1-2 drinks per week   Drug use: Yes    Types: Marijuana    Comment: for nausea (every night)   Sexual activity: Not Currently    Comment: lives alone, retired from [employer], no dietary restrictions  Other Topics Concern   Not on file  Social History Narrative   Not on file   Social Drivers of Health   Financial Resource Strain: High Risk (05/03/2024)   Overall Financial Resource Strain (CARDIA)    Difficulty of Paying Living Expenses: Hard  Food Insecurity: No Food Insecurity (05/03/2024)   Hunger Vital Sign    Worried About Running Out of Food in the Last Year: Never true    Ran Out of Food in the Last Year: Never true  Transportation Needs: No Transportation Needs (05/03/2024)   PRAPARE - Administrator, Civil Service (Medical): No    Lack of Transportation (Non-Medical): No  Physical Activity: Inactive (05/03/2024)   Exercise Vital Sign    Days of Exercise per Week: 0 days    Minutes of Exercise per Session: 0 min  Stress: No Stress Concern Present (05/03/2024)   [employer] of Occupational Health - Occupational Stress Questionnaire    Feeling of Stress: Not at all  Social Connections: Socially  Isolated (05/03/2024)   Social Connection and Isolation Panel    Frequency of Communication with Friends and Family: More than three times a week    Frequency of Social Gatherings with Friends and Family: Not on file    Attends Religious Services: Never    Database administrator or Organizations: No    Attends Banker Meetings: Never    Marital Status: Never married    Tobacco Counseling Counseling given: Not Answered    Clinical Intake:  Pre-visit preparation completed: Yes  Pain : No/denies pain     BMI - recorded: 21.7 Nutritional Status: BMI of 19-24  Normal Nutritional Risks: None Diabetes: No  Lab Results  Component Value Date   HGBA1C 5.4 04/14/2024   HGBA1C 5.4 12/23/2023   HGBA1C 5.3 10/22/2023     How often do you need to have someone help you when you read instructions, pamphlets, or other written materials from your doctor or pharmacy?: 1 - Never  Interpreter Needed?: No  Information entered by :: Lolita Libra, CMA   Activities of Daily Living ***    05/02/2024    7:04 PM  In your present state of health, do you have any difficulty performing the following activities:  Hearing? 0  Vision? 0  Difficulty concentrating or making decisions? 0  Walking or climbing stairs? 0  Dressing or bathing? 0  Doing errands, shopping? 0  Preparing Food and eating ? N  Using the Toilet? N  In the past six months, have you accidently leaked urine? N  Do you have problems with loss of bowel control? N  Managing your Medications? N  Managing your Finances? N  Housekeeping or managing your Housekeeping? N    Patient Care Team: Domenica Harlene LABOR, MD as PCP - General (Family Medicine) Kristie Lamprey, MD as Consulting Physician (Gastroenterology) Ronni Elijah HERO, PT as Physical Therapist (Physical Therapy) Duwayne Purchase, MD as Consulting Physician (Orthopedic Surgery) Camillo Golas, MD as Attending Physician (Ophthalmology) *** I have updated  your  Care Teams any recent Medical Services you may have received from other providers in the past year.     Assessment:   This is a routine wellness examination for [place].  Hearing/Vision screen Hearing Screening - Comments:: Denies hearing difficulties.  Vision Screening - Comments:: Cataracts removed bilaterally  w/lens implants 02/2023 with [facility] and will call them to schedule next exam   Goals Addressed               This Visit's Progress     Patient Stated (pt-stated)        She wants to begin using silver sneakers with friends       Depression Screen ***    05/03/2024    2:25 PM 05/01/2023    3:08 PM 04/29/2022    2:54 PM 04/29/2022    2:43 PM 09/26/2021    3:11 PM 04/24/2021    3:07 PM 03/25/2021    9:53 AM  PHQ 2/9 Scores  PHQ - 2 Score 0 1 0 0 1 0 1  PHQ- 9 Score     1  1    Fall Risk ***    05/02/2024    7:04 PM 04/24/2023    4:57 PM 04/29/2022    2:40 PM 04/24/2021    3:06 PM 03/25/2021    9:52 AM  Fall Risk   Falls in the past year? 0 0 0 0 1  Number falls in past yr: 0 0 0 0 1  Injury with Fall? 0 0 0 0 0  Risk for fall due to :  No Fall Risks No Fall Risks    Follow up  Falls evaluation completed Falls evaluation completed  Falls prevention discussed       Data saved with a previous flowsheet row definition    MEDICARE RISK AT HOME: *** Medicare Risk at Home Any stairs in or around the home?: (Patient-Rptd) No If so, are there any without handrails?: (Patient-Rptd) No Home free of loose throw rugs in walkways, pet beds, electrical cords, etc?: (Patient-Rptd) Yes Adequate lighting in your home to reduce risk of falls?: (Patient-Rptd) Yes Life alert?: (Patient-Rptd) No Use of a cane, walker or w/c?: (Patient-Rptd) No Grab bars in the bathroom?: (Patient-Rptd) Yes Shower chair or bench in shower?: (Patient-Rptd) Yes Elevated toilet seat or a handicapped toilet?: (Patient-Rptd) No  TIMED UP AND GO:  Was the test performed?   {AMBTIMEDUPGO:[phone]}  Cognitive Function: {CognitiveScreening:32337}        05/03/2024    2:14 PM 05/01/2023    3:11 PM 04/29/2022    3:15 PM  6CIT Screen  What Year? 0 points 0 points 0 points  What month? 0 points 0 points 0 points  What time? 0 points 0 points 0 points  Count back from 20 0 points 0 points 0 points  Months in reverse 0 points 0 points 0 points  Repeat phrase 0 points 0 points 0 points  Total Score 0 points 0 points 0 points    Immunizations Immunization History  Administered Date(s) Administered   Fluad Quad(high Dose 65+) 08/25/2022   Influenza Split 08/04/2023   Influenza, High Dose Seasonal PF 08/03/2018, 07/11/2019, 07/11/2019   Influenza, Seasonal, Injecte, Preservative Fre 12/02/2012   Influenza,inj,Quad PF,6+ Mos 07/31/2014, 10/02/2015, 09/19/2016   Influenza-Unspecified 08/12/2017, 08/06/2020, 08/27/2021   Novavax(Covid-19) Vaccine 08/04/2023   PFIZER(Purple Top)SARS-COV-2 Vaccination 12/17/2019, 01/11/2020, 08/06/2020, 02/25/2021   Pfizer Covid-19 Vaccine Bivalent Booster 15yrs & up 08/27/2021   Pneumococcal Conjugate-13 10/02/2015  Pneumococcal Polysaccharide-23 05/14/2017   Rsv, Bivalent, Protein Subunit Rsvpref,pf [place]) 08/25/2022   Tdap 04/11/2023   Zoster Recombinant(Shingrix) 08/08/2019, 10/14/2019   Zoster, Live 12/02/2012    Screening Tests Health Maintenance  Topic Date Due   Medicare Annual Wellness (AWV)  04/30/2024   Colonoscopy  11/02/2024 (Originally 03/20/2024)   COVID-19 Vaccine (7 - 2024-25 season) 11/09/2024 (Originally 09/29/2023)   INFLUENZA VACCINE  06/10/2024   MAMMOGRAM  11/18/2024   DTaP/Tdap/Td (2 - Td or Tdap) 04/10/2033   Pneumococcal Vaccine: 50+ Years  Completed   DEXA SCAN  Completed   Hepatitis C Screening  Completed   Zoster Vaccines- Shingrix  Completed   Hepatitis B Vaccines  Aged Out   HPV VACCINES  Aged Out   Meningococcal B Vaccine  Aged Out    Health Maintenance  Health Maintenance Due   Topic Date Due   Medicare Annual Wellness (AWV)  04/30/2024   Health Maintenance Items Addressed: {HMMCR (Optional):30011}  Additional Screening:  Vision Screening: Recommended annual ophthalmology exams for early detection of glaucoma and other disorders of the eye. Would you like a referral to an eye doctor? {YES/NO:21197}   Dental Screening: Recommended annual dental exams for proper oral hygiene  Community Resource Referral / Chronic Care Management: CRR required this visit?  {YES/NO:21197}  CCM required this visit?  {CCM Required choices:[phone]}   Plan:    I have personally reviewed and noted the following in the patient's chart:   Medical and social history Use of alcohol, tobacco or illicit drugs  Current medications and supplements including opioid prescriptions. {Opioid [phone]} Functional ability and status Nutritional status Physical activity Advanced directives List of other physicians Hospitalizations, surgeries, and ER visits in previous 12 months Vitals Screenings to include cognitive, depression, and falls Referrals and appointments  In addition, I have reviewed and discussed with patient certain preventive protocols, quality metrics, and best practice recommendations. A written personalized care plan for preventive services as well as general preventive health recommendations were provided to patient.   Lolita Libra, CMA   05/03/2024   After Visit Summary: {CHL AMB AWV After Visit Summary:[phone]}  Notes: {Nurse Notes:32343}

## 2024-05-03 NOTE — Telephone Encounter (Signed)
 Pt had AWV today. She reports a  lot of changes in life the last 2 years.  Personal health conditions, loss of 2 closest family members. Recently lost her part time job and is looking for another but this has caused her to drop her golfing activity which is leading to further isolation.  She is going to look into Silver Sneakers through her insurance. She is concerned that insurance will not pay for therapy and feels she would benefit from counseling and having someone to talk to. Finances are very tight at the moment leading to further isolation. Placed VBCI referral today. She also mentions that she was trying to taper off Bupropion . Pt was tearful in much of the visit today. Pt feels she would benefit by staying on bupropion  for a while longer and is asking for RX for previous dose of 150mg  daily.  Please advise.

## 2024-05-03 NOTE — Patient Instructions (Addendum)
 Stephanie Barnes , Thank you for taking time out of your busy schedule to complete your Annual Wellness Visit with me. I enjoyed our conversation and look forward to speaking with you again next year. I, as well as your care team,  appreciate your ongoing commitment to your health goals. Please review the following plan we discussed and let me know if I can assist you in the futur  Your Game plan/ To Do List              Please call [phone] to schedule your colonoscopy when you have someone to be with you for the colonoscopy.   Please call Radiology at [facility] to schedule your mammogram at [phone].  Follow up Visits: Next Medicare AWV with our clinical staff:   05/04/25 1:40pm  Next Office Visit with your provider: 10/20/24 @ 9am Dr Domenica  Clinician Recommendations:  Aim for 30 minutes of exercise or brisk walking, 6-8 glasses of water, and 5 servings of fruits and vegetables each day.       This is a list of the screening recommended for you and due dates:  Health Maintenance  Topic Date Due   Colon Cancer Screening  03/20/2024   Medicare Annual Wellness Visit  04/30/2024   COVID-19 Vaccine (7 - 2024-25 season) 11/09/2024*   Flu Shot  06/10/2024   Mammogram  11/18/2024   DTaP/Tdap/Td vaccine (2 - Td or Tdap) 04/10/2033   Pneumococcal Vaccine for age over 31  Completed   DEXA scan (bone density measurement)  Completed   Hepatitis C Screening  Completed   Zoster (Shingles) Vaccine  Completed   Hepatitis B Vaccine  Aged Out   HPV Vaccine  Aged Out   Meningitis B Vaccine  Aged Out  *Topic was postponed. The date shown is not the original due date.

## 2024-05-04 ENCOUNTER — Telehealth: Payer: Self-pay

## 2024-05-04 NOTE — Progress Notes (Signed)
 Complex Care Management Note  Care Guide Note 05/04/2024 Name: [name] MRN: [id number] DOB: [birth year]  Stephanie Barnes is a 73 y.o. year old female who sees Domenica Harlene LABOR, MD for primary care. I reached out to Stephanie Barnes by phone today to offer complex care management services.  [name] was given information about Complex Care Management services today including:   The Complex Care Management services include support from the care team which includes your Nurse Care Manager, Clinical Social Worker, or Pharmacist.  The Complex Care Management team is here to help remove barriers to the health concerns and goals most important to you. Complex Care Management services are voluntary, and the patient may decline or stop services at any time by request to their care team member.   Complex Care Management Consent Status: Patient agreed to services and verbal consent obtained.   Follow up plan:  Telephone appointment with complex care management team member scheduled for:  05/10/24 at 3:00 p.m.  Encounter Outcome:  Patient Scheduled  Dreama [facility]  [facility], [facility] Care Management Assistant Direct Dial: [phone]  Fax: [phone]

## 2024-05-07 ENCOUNTER — Other Ambulatory Visit: Payer: Self-pay | Admitting: Family Medicine

## 2024-05-07 ENCOUNTER — Other Ambulatory Visit: Payer: Self-pay | Admitting: Gastroenterology

## 2024-05-09 ENCOUNTER — Encounter: Payer: Self-pay | Admitting: Family Medicine

## 2024-05-09 NOTE — Telephone Encounter (Signed)
 Requesting: clonazepam  1mg   Contract: 04/03/21 UDS: 10/22/23 Last Visit: 04/14/24 Next Visit: 10/20/24 Last Refill: 03/08/24 #60 and 9mq  Please Advise

## 2024-05-10 ENCOUNTER — Other Ambulatory Visit: Payer: Self-pay | Admitting: Licensed Clinical Social Worker

## 2024-05-11 NOTE — Patient Instructions (Signed)
 Visit Information  Thank you for taking time to visit with me today. Please don't hesitate to contact me if I can be of assistance to you before our next scheduled appointment.  Your next care management appointment is no further scheduled appointments.    Closing From: Complex Care Management.  Please call the care guide team at [phone] if you need to cancel, schedule, or reschedule an appointment.   Please call 911 if you are experiencing a Mental Health or Behavioral Health Crisis or need someone to talk to.  Fletcher Humble MSW, LCSW Licensed Clinical Social Worker  [facility], Population Health Direct Dial: [phone]  Fax: [phone]

## 2024-05-11 NOTE — Patient Outreach (Signed)
 Complex Care Management   Visit Note  05/11/2024  Name:  Stephanie Barnes MRN: [id number] DOB: 1951/08/17  Situation: Referral received for Complex Care Management related to social support I obtained verbal consent from Patient.  Visit completed with B Cunning  on the phone. Pt reports lack of social support. Pt reports needing support when she has colonoscopy. Pt encouraged to contact health team advantage to enroll in papa pals program. Pt reports she is currently enrolled in therapy. According to Ms. Ibrahim she is not interested in [employer]. Ms. Chowning reports having a close friend residing in [place]. Pt encouraged to contact family friend for assistance with colonoscopy appt.   Background:   Past Medical History:  Diagnosis Date   Abnormal EKG 06/13/2013   Allergy 1991   See medical file for list.   Anemia 01/13/2016   Anxiety    Arthritis    Cataract 2/24   Have had cataracts removed from both eyes this year.   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   Welcome to Medicare preventive visit 10/02/2015    Assessment: Patient Reported Symptoms:  Cognitive Cognitive Status: Able to follow simple commands, Alert and oriented to person, place, and time Cognitive/Intellectual Conditions Management [RPT]: None reported or documented in medical history or problem list   Health Maintenance Behaviors: Annual physical exam  Neurological Neurological Review of Symptoms: No symptoms reported    HEENT HEENT Symptoms Reported: No symptoms reported      Cardiovascular Cardiovascular Symptoms Reported: No symptoms reported     Respiratory Respiratory Symptoms Reported: No symptoms reported    Endocrine Endocrine Symptoms Reported: No symptoms reported Is patient diabetic?: No    Gastrointestinal Gastrointestinal Symptoms Reported: No symptoms reported      Genitourinary Genitourinary Symptoms Reported: No symptoms reported    Integumentary Integumentary Symptoms Reported: No symptoms reported    Musculoskeletal          Psychosocial       Do you feel physically threatened by others?: No      05/03/2024    4:15 PM  Depression screen PHQ 2/9  Decreased Interest --    There were no vitals filed for this visit.  Medications Reviewed Today     Reviewed by Clement Odor, LCSW (Social Worker) on 05/11/24 at [phone]  Med List Status: <None>   Medication Order Taking? Sig Documenting Provider Last Dose Status Informant  atorvastatin  (LIPITOR) 10 MG tablet [id number]  Take 1 tablet (10 mg total) by mouth daily. Domenica Harlene LABOR, MD  Active   buPROPion  (WELLBUTRIN  XL) 150 MG 24 hr tablet [id number]  Take 1 tablet (150 mg total) by mouth daily. Domenica Harlene LABOR, MD  Active   cetirizine (ZYRTEC) 10 MG tablet [id number]  Take 10 mg by mouth daily as needed for allergies.  [provider]  Active Self           Med Note WANETTA JACKQUELINE JULIANNA Pablo Mar 07, 2019 12:02 PM)    clonazePAM  (KLONOPIN ) 1 MG tablet [id number]  TAKE 1/2 TO 1 (ONE-HALF TO ONE) TABLET BY MOUTH TWICE  DAILY AS NEEDED FOR ANXIETY Webb, Padonda B, FNP  Active   famotidine  (PEPCID ) 40 MG tablet [id number]  Take 1 tablet by mouth twice daily Nandigam, Kavitha V, MD  Active   fluticasone  (FLONASE ) 50 MCG/ACT nasal spray [id number]  Place 2 sprays into both nostrils daily. Almarie Birmingham B, NP  Expired 05/03/24 2359   gabapentin  (NEURONTIN ) 300 MG capsule [id number]  Take 300 mg by mouth 3 (three) times daily. [provider]  Active   Melatonin-Pyridoxine (MELATONEX) 3-10 MG TBCR [id number]  Take 1 tablet by mouth at bedtime. [provider]  Active   mometasone  (ELOCON ) 0.1 % ointment [id number]  Apply topically daily. As needed Domenica Harlene LABOR, MD  Active   mupirocin  cream (BACTROBAN ) 2 % [id number]  Apply 1 application. topically 2 (two) times daily. Saguier, [place], PA-C  Active   ondansetron  (ZOFRAN ) 4 MG tablet [id number]  TAKE 1 TABLET BY MOUTH EVERY 8 HOURS AS NEEDED FOR NAUSEA FOR VOMITING Domenica Harlene LABOR, MD  Active   Probiotic Product (PROBIOTIC PO) [id number]  Take by mouth. [provider]  Active   TURMERIC PO [id number]  Take by mouth. [provider]  Active             Recommendation:   Continue Current Plan of Care  Follow Up Plan:   Closing From:  Complex Care Management  Alfonso Rummer MSW, LCSW Licensed Clinical Social Worker  [facility], [facility] Direct Dial: [phone]  Fax: [phone]

## 2024-05-17 ENCOUNTER — Encounter: Payer: Self-pay | Admitting: Family Medicine

## 2024-05-18 ENCOUNTER — Other Ambulatory Visit: Payer: Self-pay | Admitting: Family Medicine

## 2024-05-18 DIAGNOSIS — Z1231 Encounter for screening mammogram for malignant neoplasm of breast: Secondary | ICD-10-CM

## 2024-05-18 DIAGNOSIS — Z1211 Encounter for screening for malignant neoplasm of colon: Secondary | ICD-10-CM

## 2024-05-23 NOTE — Patient Outreach (Signed)
 Note correction: Pt was engaged in counseling previously approx three months ago and is currently interested in therapy. Pt is encouraged to contact health insurance to locate in network counseling provider to ensure highest level of benefits are utilize to prevent utilizing out of network providers.

## 2024-05-25 DIAGNOSIS — F4320 Adjustment disorder, unspecified: Secondary | ICD-10-CM | POA: Diagnosis not present

## 2024-06-02 ENCOUNTER — Ambulatory Visit
Admission: RE | Admit: 2024-06-02 | Discharge: 2024-06-02 | Disposition: A | Discharge status: Home / Self Care | Source: Ambulatory Visit | Attending: Family Medicine | Admitting: Family Medicine

## 2024-06-02 DIAGNOSIS — Z1231 Encounter for screening mammogram for malignant neoplasm of breast: Secondary | ICD-10-CM

## 2024-06-02 DIAGNOSIS — R92333 Mammographic heterogeneous density, bilateral breasts: Secondary | ICD-10-CM | POA: Diagnosis not present

## 2024-06-08 DIAGNOSIS — F4320 Adjustment disorder, unspecified: Secondary | ICD-10-CM | POA: Diagnosis not present

## 2024-06-25 ENCOUNTER — Other Ambulatory Visit: Payer: Self-pay | Admitting: Gastroenterology

## 2024-06-25 ENCOUNTER — Other Ambulatory Visit: Payer: Self-pay | Admitting: Family

## 2024-07-07 ENCOUNTER — Other Ambulatory Visit: Payer: Self-pay

## 2024-07-07 NOTE — Telephone Encounter (Signed)
 Requesting: Klonopin  1 MG Contract: No UDS: 10/22/23 Last Visit: 04/14/24 Next Visit: 10/20/24 Last Refill: 05/09/24  Please Advise   Copied from CRM #[id number]. Topic: Clinical - Prescription Issue >> Jul 07, 2024 10:07 AM Charlet HERO wrote: Reason for CRM: clonazePAM  (KLONOPIN ) 1 MG tablet she is stating that she is going out of town and the day that she leaves is her last pill on Thursday she would like to have it filled by wed, advised of 3 day and early refill she would like to have a call back today.

## 2024-07-08 MED ORDER — CLONAZEPAM 1 MG PO TABS: ORAL_TABLET | ORAL | 0 refills | Status: DC

## 2024-08-08 ENCOUNTER — Ambulatory Visit
Admission: RE | Admit: 2024-08-08 | Discharge: 2024-08-08 | Disposition: A | Discharge status: Home / Self Care | Attending: Physician Assistant | Admitting: Physician Assistant

## 2024-08-08 ENCOUNTER — Ambulatory Visit: Admitting: Radiology

## 2024-08-08 ENCOUNTER — Other Ambulatory Visit: Payer: Self-pay

## 2024-08-08 VITALS — BP 134/83 | HR 66 | Temp 97.9°F | Resp 18 | Ht 59.75 in | Wt 110.2 lb

## 2024-08-08 DIAGNOSIS — S61217A Laceration without foreign body of left little finger without damage to nail, initial encounter: Secondary | ICD-10-CM

## 2024-08-08 DIAGNOSIS — S61219A Laceration without foreign body of unspecified finger without damage to nail, initial encounter: Secondary | ICD-10-CM | POA: Diagnosis not present

## 2024-08-08 NOTE — ED Provider Notes (Signed)
 [facility]    CSN: [id number] Arrival date & time: 08/08/24  1455      History   Chief Complaint Chief Complaint  Patient presents with   Finger Injury    Cut on left pinky finger across knuckle.  Won't stay closed. Unsure if glass in there.  May need a stitch or 2 - Entered by patient    HPI Stephanie Barnes is a 73 y.o. female.  has a past medical history of Abnormal EKG (06/13/2013), Allergy (1991), Anemia (01/13/2016), Anxiety, Arthritis, Cataract (2/24), Complex regional pain syndrome (01/01/2016), Depression, Depression with anxiety (12/02/2012), Distal radius fracture, left, Frozen shoulder (09/23/2015), Medicare annual wellness visit, subsequent (10/02/2015), Multiple allergies (03/31/2016), Proximal humerus fracture, Rotator cuff tear, RSD upper limb (01/01/2016), and Welcome to Medicare preventive visit (10/02/2015).   HPI  Discussed the use of AI scribe software for clinical note transcription with the patient, who gave verbal consent to proceed. The patient presents with a laceration on her finger after dropping a glass pitcher.  She sustained a laceration on her finger after dropping and breaking a glass pitcher while washing it yesterday afternoon. The cut is extremely sore and has bled significantly, raising concern about a possible glass fragment embedded in the wound.  The wound is most tender near the knuckle and splits open with knuckle movement, making it difficult to keep closed. She has been applying Bactroban  ointment since the injury occurred. She had to work for a few hours today, which delayed her visit for medical evaluation.  She has been using mupirocin  ointment on the wound   Past Medical History:  Diagnosis Date   Abnormal EKG 06/13/2013   Allergy 1991   See medical file for list.   Anemia 01/13/2016   Anxiety    Arthritis    Cataract 2/24   Have had cataracts removed from both eyes this year.   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   Welcome to Medicare preventive visit 10/02/2015    Patient Active Problem List   Diagnosis Date Noted   Nausea in adult 11/29/2021   Diverticulosis 03/25/2021   Sinusitis 12/17/2020   Weight loss 08/08/2019   Closed left arm fracture 06/08/2019   Closed fracture of left proximal humerus 03/15/2019   Vitamin D  deficiency 12/07/2018   Cervical cancer screening 12/07/2018   Dense breast tissue on mammogram 04/13/2018   Chronic shoulder pain (Location of Primary Source of Pain) (Right) 09/04/2016   Chronic upper extremity pain (Right) 09/04/2016   Neuropathic pain of shoulder (Right) 09/04/2016   Spondylosis of cervical spine (C5-6 and C6-7) 09/04/2016   Cervical DDD (degenerative disc disease) (C5-6 and C6-7) 09/04/2016   Osteoarthritis of right AC (acromioclavicular) joint 09/04/2016   Abnormal MRI, cervical spine (07/23/2015) 09/04/2016   Abnormal MRI, shoulder (08/06/2015) 09/04/2016   Subdeltoid bursitis of right shoulder joint 09/04/2016   Subacromial bursitis of right shoulder joint 09/04/2016   Labral tear of shoulder, right, sequela 09/04/2016   Synovitis involving glenohumeral joint, right 09/04/2016   Biceps tendinopathy, right 09/04/2016   Infraspinatus tendon tear, right, sequela 09/04/2016   Supraspinatus  tendon tear, right, sequela 09/04/2016   Allergies 03/31/2016   Anemia 01/13/2016   CRPS (complex regional pain syndrome), upper limb 01/01/2016   Preventative health care 10/02/2015   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   Cervical disc disorder with radiculopathy of cervical region 07/17/2015   Colon polyp   tubular adenoma   03/2014 03/22/2014   Hyperlipidemia 06/13/2013   Osteopenia 06/13/2013   Insomnia 06/09/2013   Depression with anxiety 12/02/2012   DJD (degenerative joint disease) 12/02/2012   GERD (gastroesophageal reflux disease) 12/02/2012   Eczema 12/02/2012   Tendonitis of right hand 12/02/2012    Past Surgical History:  Procedure Laterality Date   COLONOSCOPY  03/31/2014   Dr.Mann-polyp   DENTAL SURGERY     Root cleaning    EYE SURGERY  2024   Cataract/Laser   FRACTURE SURGERY  03/15/19   Left shoulder replaced; plate in broken wrist.   JOINT REPLACEMENT  03/15/19   Accident. Broke left shoulder & wrist. Left shoulder replace   LASIK     MOUTH SURGERY  08/10/2017   front lower   OPEN REDUCTION INTERNAL FIXATION (ORIF) DISTAL RADIAL FRACTURE Left 03/15/2019   Procedure: OPEN REDUCTION INTERNAL FIXATION (ORIF) DISTAL RADIAL FRACTURE;  Surgeon: Camella Fallow, MD;  Location: [facility] OR;  Service: Orthopedics;  Laterality: Left;   REFRACTIVE SURGERY Bilateral 2000   REVERSE SHOULDER ARTHROPLASTY Left 03/15/2019   Procedure: REVERSE SHOULDER ARTHROPLASTY;  Surgeon: Sharl Selinda Dover, MD;  Location: [facility] OR;  Service: Orthopedics;  Laterality: Left;    ROTATOR CUFF REPAIR Right 2017   TONSILLECTOMY      OB History     Gravida  2   Para      Term      Preterm      AB  2   Living         SAB      IAB      Ectopic      Multiple      Live Births               Home Medications    Prior to Admission medications   Medication Sig Start Date End Date Taking? Authorizing Provider  atorvastatin  (LIPITOR) 10 MG tablet Take 1 tablet (10 mg total) by mouth daily. 04/22/24   Domenica Harlene LABOR, MD  buPROPion  (WELLBUTRIN  XL) 150 MG 24 hr tablet Take 1 tablet (150 mg total) by mouth daily. 05/03/24   Domenica Harlene LABOR, MD  cetirizine (ZYRTEC) 10 MG tablet Take 10 mg by mouth daily as needed for allergies.     [provider]  clonazePAM  (KLONOPIN ) 1 MG tablet TAKE 1/2 TO 1  (ONE-HALF TO ONE) TABLET BY MOUTH TWICE DAILY AS NEEDED FOR ANXIETY 07/08/24   Webb, Padonda B, FNP  famotidine  (PEPCID ) 40 MG tablet Take 1 tablet by mouth twice daily 06/27/24   Nandigam, Kavitha V, MD  fluticasone  (FLONASE ) 50 MCG/ACT nasal spray Place 2 sprays into both nostrils daily. 02/06/22 05/03/24  Almarie Waddell NOVAK, NP  gabapentin  (NEURONTIN ) 300 MG capsule Take 300 mg by mouth 3 (three) times daily.    [provider]  Melatonin-Pyridoxine (MELATONEX) 3-10 MG TBCR Take 1 tablet by mouth at bedtime. 08/12/19   [provider]  mometasone  (ELOCON ) 0.1 % ointment Apply topically daily. As needed 04/01/21   Domenica Harlene LABOR, MD  mupirocin  cream (BACTROBAN ) 2 % Apply 1 application. topically 2 (two) times daily. 03/24/22   Saguier, [place], PA-C  ondansetron  (ZOFRAN )  4 MG tablet TAKE 1 TABLET BY MOUTH EVERY 8 HOURS AS NEEDED FOR NAUSEA FOR VOMITING 11/19/23   Domenica Harlene LABOR, MD  Probiotic Product (PROBIOTIC PO) Take by mouth.    [provider]  TURMERIC PO Take by mouth.    [provider]    Family History Family History  Problem Relation Age of Onset   Heart disease Mother    Arthritis Mother    Heart disease Father    COPD Father    Colon polyps Father    Mental illness Brother        bipolar   Hypertension Brother    Suicidality Brother    Alcohol abuse Brother    Anxiety disorder Brother    Depression Brother    Heart disease Maternal Aunt    Varicose Veins Maternal Aunt    Heart disease Maternal Uncle    Varicose Veins Maternal Uncle    Heart disease Paternal Aunt    COPD Paternal Aunt    Heart disease Paternal Uncle    Heart disease Maternal Grandmother    Heart disease Maternal Grandfather    Heart disease Paternal Grandmother    Heart disease Paternal Grandfather    Colon cancer Neg Hx    Esophageal cancer Neg Hx    Stomach cancer Neg Hx    Rectal cancer Neg Hx     Social History Social History   Tobacco Use   Smoking status:  Never   Smokeless tobacco: Never  Vaping Use   Vaping status: Never Used  Substance Use Topics   Alcohol use: Yes    Alcohol/week: 2.0 standard drinks of alcohol    Types: 2 Standard drinks or equivalent per week    Comment: 1-2 drinks per week   Drug use: Yes    Types: Marijuana    Comment: for nausea (every night)     Allergies   Aspirin, Celebrex [celecoxib], Doxycycline, Prednisone, Fosamax [alendronate sodium], Other, Amitriptyline, Chlorhexidine , Lactose intolerance (gi), Celexa [citalopram], Clindamycin/lincomycin, and Tape   Review of Systems Review of Systems  Skin:  Positive for wound (to left little finger).     Physical Exam Triage Vital Signs ED Triage Vitals  Encounter Vitals Group     BP 08/08/24 1559 134/83     Girls Systolic BP Percentile --      Girls Diastolic BP Percentile --      Boys Systolic BP Percentile --      Boys Diastolic BP Percentile --      Pulse Rate 08/08/24 1559 66     Resp 08/08/24 1559 18     Temp 08/08/24 1559 97.9 F (36.6 C)     Temp Source 08/08/24 1559 Oral     SpO2 08/08/24 1559 98 %     Weight 08/08/24 1558 110 lb 3.7 oz (50 kg)     Height 08/08/24 1558 4' 11.75 (1.518 m)     Head Circumference --      Peak Flow --      Pain Score 08/08/24 1557 0     Pain Loc --      Pain Education --      Exclude from Growth Chart --    No data found.  Updated Vital Signs BP 134/83 (BP Location: Right Arm)   Pulse 66   Temp 97.9 F (36.6 C) (Oral)   Resp 18   Ht 4' 11.75 (1.518 m)   Wt 110 lb 3.7 oz (50 kg)   SpO2  98%   BMI 21.71 kg/m   Visual Acuity Right Eye Distance:   Left Eye Distance:   Bilateral Distance:    Right Eye Near:   Left Eye Near:    Bilateral Near:     Physical Exam Vitals reviewed.  Constitutional:      General: She is awake.     Appearance: Normal appearance. She is well-developed and well-groomed.  HENT:     Head: Normocephalic and atraumatic.  Eyes:     General: Lids are normal. Gaze  aligned appropriately.     Extraocular Movements: Extraocular movements intact.     Conjunctiva/sclera: Conjunctivae normal.  Pulmonary:     Effort: Pulmonary effort is normal.  Musculoskeletal:       Hands:  Neurological:     Mental Status: She is alert and oriented to person, place, and time.  Psychiatric:        Attention and Perception: Attention and perception normal.        Mood and Affect: Mood and affect normal.        Speech: Speech normal.        Behavior: Behavior normal. Behavior is cooperative.      UC Treatments / Results  Labs (all labs ordered are listed, but only abnormal results are displayed) Labs Reviewed - No data to display  EKG   Radiology DG Finger Little Left Result Date: 08/08/2024 CLINICAL DATA:  Laceration injury EXAM: LEFT FINGER(S) - 2+ VIEW COMPARISON:  None Available. FINDINGS: No fracture or malalignment. Joint space narrowing involving the IP joints. No radiopaque foreign body. Partially visualized hardware in the distal radius IMPRESSION: No acute osseous abnormality. Electronically Signed   By: Luke Bun M.D.   On: 08/08/2024 16:36    Procedures Procedures (including critical care time)  Medications Ordered in UC Medications - No data to display  Initial Impression / Assessment and Plan / UC Course  I have reviewed the triage vital signs and the nursing notes.  Pertinent labs & imaging results that were available during my care of the patient were reviewed by me and considered in my medical decision making (see chart for details).      Final Clinical Impressions(s) / UC Diagnoses   Final diagnoses:  Laceration of left little finger without damage to nail, foreign body presence unspecified, initial encounter   Finger laceration with possible retained foreign body (glass) Laceration on the finger from a broken glass pitcher, occurring approximately 24 hours ago. The wound is extremely tender, bleeds frequently, and may contain  a retained glass fragment. Suturing is not recommended due to the risk of infection from potential bacterial entrapment. The wound's location near the knuckle causes it to reopen with movement. - Order x-ray of the finger to check for retained glass fragment-imaging is negative for signs of foreign body - Reiterated to patient that closure is not recommended due to the age of the wound.  Will provide patient with a splint and buddy tape to help with keeping the finger straight so that she does not continue to reopen the wound.  Reviewed that she can bandage the wound with Band-Aids, gauze or tape as desired to help with further bleeding should this occur.  Recommend keeping the wound clean with warm water and gentle soap and apply antibiotic ointment at home if desired.  ED and return precautions reviewed and provided in AVS.  Follow-up as needed for progressing or persistent symptoms    Discharge Instructions  You were seen today for concerns of a laceration to your left little finger.  At this time the wound is too old for us  to close as this could cause a worse outcome by trapping bacteria in the wound.  We typically allow these wounds to heal on their own once they are older than 12 to 18 hours.  We have supplied you with a finger splint to help keep you from bending your finger and then we buddy taped it to your left ring finger.  Please try to keep this splinting on to help prevent your finger from bending and reopening the wound.  This should help with the closure.  Make sure that you are keeping the wound clean with warm water and gentle soap.  You do not have to scrub or use harsh cleansers on the area as this could cause further irritation.  As desired you can use an antibiotic ointment such as mupirocin  on the wound to help with keeping it clean. If you notice any of the following symptoms please return urgent care or go to the emergency room: Swelling of the finger, copious bleeding,  drainage that looks like pus, difficulty moving or bending your finger or hand, increased warmth to the touch, fever or chills     ED Prescriptions   None    PDMP not reviewed this encounter.   Marylene Rocky BRAVO, PA-C 08/09/24 [id number]

## 2024-08-08 NOTE — ED Notes (Signed)
 Stephanie MECUM PA buddy taped fingers and applied finger splint.

## 2024-08-08 NOTE — ED Triage Notes (Signed)
 Pt presents to urgent care with left pinky finger injury that occurred yesterday at approximately 1 PM. States she was cleaning a glass pitcher and it broke while washing. States the laceration is located on the bending part of finger. Keeps opening back up and begins to bleed. Has band-aid on finger in triage. Bleeding is controlled. TDAP vaccine is up-to-date.

## 2024-08-09 NOTE — Discharge Instructions (Signed)
 You were seen today for concerns of a laceration to your left little finger.  At this time the wound is too old for us  to close as this could cause a worse outcome by trapping bacteria in the wound.  We typically allow these wounds to heal on their own once they are older than 12 to 18 hours.  We have supplied you with a finger splint to help keep you from bending your finger and then we buddy taped it to your left ring finger.  Please try to keep this splinting on to help prevent your finger from bending and reopening the wound.  This should help with the closure.  Make sure that you are keeping the wound clean with warm water and gentle soap.  You do not have to scrub or use harsh cleansers on the area as this could cause further irritation.  As desired you can use an antibiotic ointment such as mupirocin  on the wound to help with keeping it clean. If you notice any of the following symptoms please return urgent care or go to the emergency room: Swelling of the finger, copious bleeding, drainage that looks like pus, difficulty moving or bending your finger or hand, increased warmth to the touch, fever or chills

## 2024-08-11 ENCOUNTER — Encounter: Payer: Self-pay | Admitting: Family Medicine

## 2024-08-25 ENCOUNTER — Ambulatory Visit: Payer: Self-pay | Admitting: Clinical

## 2024-08-25 ENCOUNTER — Telehealth: Payer: Self-pay | Admitting: Clinical

## 2024-08-25 NOTE — Telephone Encounter (Signed)
 08/25/2024 TC to patient to inform her that she will need an in-person visit first due to recent change in Medicare rules.  Stephanie Barnes acknowledged understanding but was frustrated.  She shared the following information about her concerns and goals for therapy.  Moved from [place] to move closer to family.  Brother died after she moved down here- brother had 2 kids (Stephanie Barnes & Stephanie Barnes) She began helping take care of her niece. Niece died and she helped take care of niece's son.  April 2024 - Stephanie Barnes-(nephew) Went to rehab & was going to look for help. He had 2 daughters who she was close to. Last year he committed suicide after veteran's day. Client went to grief therapy already which was very helpful.  Current stressors - She has to drive to [place] to see his children. She has reached out to people in [place] who said they could bring the children up. But they haven't.  She wants to know what the best approach to be able to see them.  Their mother is seeing someone else.  She only sees them Holidays. She doesn't have any immediate family here.  08/31/2024 TC to patient and scheduled an earlier appointment due to a cancellation. Scheduled for 09/14/24 and 09/26/2024.

## 2024-08-26 ENCOUNTER — Other Ambulatory Visit: Payer: Self-pay | Admitting: Family Medicine

## 2024-09-04 ENCOUNTER — Other Ambulatory Visit: Payer: Self-pay | Admitting: Family

## 2024-09-05 ENCOUNTER — Ambulatory Visit: Admitting: Clinical

## 2024-09-05 DIAGNOSIS — F4323 Adjustment disorder with mixed anxiety and depressed mood: Secondary | ICD-10-CM | POA: Diagnosis not present

## 2024-09-05 NOTE — Progress Notes (Unsigned)
 [facility] Counselor Initial Adult In-Person - Comprehensive Clinical Assessment  Name: Stephanie Barnes Date: 09/05/2024 MRN: [id number] DOB: 1951/07/24 PCP: Domenica Harlene LABOR, MD  Session Time start: 4:06pm End time: 5:06pm Total time: 60  Types of Service: Comprehensive Clinical Assessment (CCA)  Guardian/Payee:  Self    Paperwork requested: {EDB:78802}  Stephanie Barnes participated from office with therapist and consented to treatment. We reviewed the limits of confidentiality prior to the start of the evaluation. Stephanie Barnes expressed understanding and agreed to proceed.   Reason for referral in patient/family's own words:  *** Tring to see nephew's children, the children's maternal grandmother is close to client - use to see them once a month (74 yo girl is the youngest child, 40 yo girl) Nephew's wife is nice to her when other people around but one on one, the wife tells her she'll check her schedule  When Brad died, neighbor call client that brad died- he had his own home, notliving together at that time  2021- he lived in his own home, a month after the girls stayed with him for half a week  She went down to [place] when he died (09-27-23) His father (client's brother) also committed suicide  Client asked for 3 things when he died: Her father's military flag 2. Gold coin from [employer] (when they were helping him through rehab & court-sympobized the time he came out of court - he has 4-_ 3. 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 Took care of her parents  when she was living there.. Sibling died (brother). Has a cousin in [place].   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  Has health conditions  2021 Niece & grandnephew lived with her (Client's brother had bipolar disorder)  Braxton's father is named Particia- they are getting evicted and car repossessed.- they live in [place]     Client likes to be called ***.  Primary language at home is {CHL AMB BASIC LANGUAGE [phone]}  SUBJECTIVE: (Client to answer as appropriate) Gender identity: *** Sex assigned at birth: *** Pronouns: {he/she/they:23295}  Standardized Assessments completed: {IBH Screening Tools:[id number]:::0}  Risk Assessment: Danger to Self:  {PSY:22692} Self-injurious Behavior: {PSY:22692} Danger to Others: {PSY:22692} Duty to Warn:{PSY:311194} Physical Aggression / Violence:{PSY:21197} Access to Firearms a concern: {PSY:21197} Gang Involvement:{PSY:21197}  Client and/or legal guardian was educated about steps to take if suicide or homicide risk level increases between visits: {Yes/No-Ex:120004} While future psychiatric events cannot be accurately predicted, the patient does not currently require acute inpatient psychiatric care and does not currently meet [facility]  involuntary commitment criteria.  Current Stressors:  {CHL AMB BH STRESSORS:[phone]}  Client and/or Family's Strengths/Protective Factors: {Patient Coping [phone]}   Current Health Habits: Sleep:   ***  Physical Activities: ***  Eating/Appetite: ***  Current Medications and therapies:  Psychotropic medications: Current medications: *** Client taking medications as prescribed:  *** Side effects reported: ***  Therapies:  {CHL AMB [phone]}  Psychiatric Review of systems: Insomnia: *** Changes in appetite: *** Decreased need for sleep: {BHH YES OR NO:22294} Hallucinations: {BHH YES OR NO:22294}   Paranoia: {BHH  YES OR WN:77705}    Psychiatric History: Past psychiatry diagnosis: *** Patient  currently being seen by therapist/psychiatrist:  *** Prior Suicide Attempts: *** Past psychiatry Hospitalization(s): *** Past history of violence: ***  Social History:  Living situation: *** Relationship status: *** Partner Preference: *** Number of Children if applicable: ***  Employment: *** Education: Artist history: *** Legal History/Concerns: *** Religious/spiritual beliefs or involvement: ***  Any cultural differences that may affect / interfere with treatment:  {Religious/Cultural:200019}  Current or History of Alcohol/Substance use: Do you use Caffeine? {BHH YES OR NO:22294} Have you recently consumed alcohol? {YES/NO/WILD RJMID:81418}  Have you recently used any drugs, eg marijuana, other substances or prescriptions drugs not prescribed to them?  {YES/NO/WILD RJMID:81418}  Have you recently consumed any tobacco or nicotine? {YES/NO/WILD RJMID:81418} Does client seem concerned about dependence or abuse of any substance? {YES/NO/WILD RJMID:81418}  Traumatic Experiences/Abuse history: Have you ever been exposed, witnessed or experienced any form of abuse, what type?  ***  Have you ever been exposed, witnessed or experienced something traumatic, describe?  ***  Family of Origin (Childhood History) ***  Family history: Family mental illness:  {CHL AMB FAMILY MENTAL ILLNESS:[phone]} Family history of bipolar disorder: {BHH YES OR NO:22294} Family school achievement history:  {CHL AMB FAMILY SCHOOL ACHIEVEMENT [phone]} Other relevant family history:  {CHL AMB OTHER RELEVANT FAMILY HISTORY:210130114}  Family History:  Family History  Problem Relation Age of Onset   Heart disease Mother    Arthritis Mother    Heart disease Father    COPD Father    Colon polyps Father    Mental illness Brother        bipolar   Hypertension Brother    Suicidality Brother    Alcohol abuse Brother    Anxiety disorder Brother    Depression Brother    Heart  disease Maternal Aunt    Varicose Veins Maternal Aunt    Heart disease Maternal Uncle    Varicose Veins Maternal Uncle    Heart disease Paternal Aunt    COPD Paternal Aunt    Heart disease Paternal Uncle    Heart disease Maternal Grandmother    Heart disease Maternal Grandfather    Heart disease Paternal Grandmother    Heart disease Paternal Grandfather    Colon cancer Neg Hx    Esophageal cancer Neg Hx    Stomach cancer Neg Hx    Rectal cancer Neg Hx      Client Medical history  Medical History/Surgical History: {Desc; reviewed/not reviewed:60074}' Past Medical History:  Diagnosis Date   Abnormal EKG 06/13/2013   Allergy 1991   See medical file for list.   Anemia 01/13/2016   Anxiety    Arthritis    Cataract 2/24   Have had cataracts removed from both eyes this year.   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   JOINT REPLACEMENT  03/15/19   Accident. Broke left shoulder & wrist. Left shoulder replace   LASIK     MOUTH SURGERY  08/10/2017   front lower   OPEN REDUCTION INTERNAL FIXATION (ORIF) DISTAL RADIAL FRACTURE Left 03/15/2019   Procedure: OPEN REDUCTION INTERNAL FIXATION (ORIF) DISTAL RADIAL FRACTURE;  Surgeon: Camella Fallow, MD;  Location: [facility] OR;  Service: Orthopedics;  Laterality: Left;   REFRACTIVE SURGERY Bilateral 2000    REVERSE SHOULDER ARTHROPLASTY Left 03/15/2019   Procedure: REVERSE SHOULDER ARTHROPLASTY;  Surgeon: Sharl Selinda Dover, MD;  Location: [facility] OR;  Service: Orthopedics;  Laterality: Left;    ROTATOR CUFF REPAIR Right 2017   TONSILLECTOMY      Medications: Current Outpatient Medications  Medication Sig Dispense Refill   atorvastatin  (LIPITOR) 10 MG tablet Take 1 tablet (10 mg total) by mouth daily. 90 tablet 1   buPROPion  (WELLBUTRIN  XL) 150 MG 24 hr tablet Take 1 tablet by mouth once daily 30 tablet 0   cetirizine (ZYRTEC) 10 MG tablet Take 10 mg by mouth daily as needed for allergies.      clonazePAM  (KLONOPIN ) 1 MG tablet TAKE 1/2 TO 1 (ONE-HALF TO ONE) TABLET BY MOUTH TWICE DAILY AS NEEDED FOR ANXIETY 60 tablet 0   famotidine  (PEPCID ) 40 MG tablet Take 1 tablet by mouth twice daily 90 tablet 0   fluticasone  (FLONASE ) 50 MCG/ACT nasal spray Place 2 sprays into both nostrils daily. 1 g 0   gabapentin  (NEURONTIN ) 300 MG capsule Take 300 mg by mouth 3 (three) times daily.     Melatonin-Pyridoxine (MELATONEX) 3-10 MG TBCR Take 1 tablet by mouth at bedtime.     mometasone  (ELOCON ) 0.1 % ointment Apply topically daily. As needed 15 g 2   mupirocin  cream (BACTROBAN ) 2 % Apply 1 application. topically 2 (two) times daily. 30 g 1   ondansetron  (ZOFRAN ) 4 MG tablet TAKE 1 TABLET BY MOUTH EVERY 8 HOURS AS NEEDED FOR NAUSEA FOR VOMITING 20 tablet 0   Probiotic Product (PROBIOTIC PO) Take by mouth.     TURMERIC PO Take by mouth.     No current facility-administered medications for this visit.    Allergies  Allergen Reactions   Aspirin Anaphylaxis and Swelling    Swelling of lips and fingers    Celebrex [Celecoxib] Anaphylaxis, Hives and Shortness Of Breath   Doxycycline Other (See Comments)    Scarred throat    Prednisone Shortness Of Breath and Other (See Comments)    Chest tightness    Fosamax [Alendronate Sodium] Hives   Other Hives and Nausea Only    Arthritec   Amitriptyline      UNSPECIFIED REACTION    Chlorhexidine      UNSPECIFIED REACTION    Lactose Intolerance (Gi) Nausea Only   Celexa [Citalopram] Other (See Comments)    Shakiness    Clindamycin/Lincomycin Nausea And Vomiting   Tape Itching and Other (See Comments)    UNSPECIFIED REACTION  Paper tape is okay to use per patient     Interventions: Interventions utilized:  {IBH Interventions:21014054:::0}   Client Family Response:  ***   Mental status exam:   General Appearance /Behavior:  {BHH GENERALAPPEARANCE/BEHAVIOR:22300} Eye Contact:  {BHH EYE CONTACT:22301} Motor Behavior:  {BHH MOTOR BEHAVIOR:22302} Speech:  {BHH SPEECH:22304} Level of Consciousness:  {BHH LEVEL OF CONSCIOUSNESS:22305} Mood:  {BHH MOOD:22306} Affect:  {BHH AFFECT:22307} Anxiety Level:  {BHH ANXIETY  OZCZO:77691} Thought Process:  {BHH THOUGHT PROCESS:22309} Thought Content:  {BHH THOUGHT CONTENT:22310} Perception:  {BHH PERCEPTION:22311} Judgment:  {BHH JUDGMENT:22312} Insight:  {BHH INSIGHT:22313}   Clinical Assessment: ***   Diagnosis: No diagnosis found.  Coordination of Care: {CHL AMB BH COORDINATION OF RJMZ:[id number]}   Recommendations for Services/Supports/Treatments: ***   Follow up Plan: A follow-up was scheduled to create a more specific treatment plan and begin treatment. Therapist answered all questions during the evaluation and contact information was provided.   Adesuwa Osgood P. Trudy, MSW, LCSW [employer] Therapist Main Office: [phone]   Individualized Treatment Plan  Stephanie Barnes participated in the creation of the treatment plan)  This plan will be reviewed at least every 12 months.   Client Abilities/Strengths Stephanie ***   Supports: ***   Goal/Needs for Treatment:  In order of importance to patient 1) *** 2) *** 3) ***   Client Statement of Needs Stephanie Barnes would like to ***     Treatment Level {Frequency of sessions.:26745}   Symptoms  ***   (Status:  {Symptom Status:26744}) ***   (Status: {Symptom Status:26744})   Client Treatment Preferences:***   Client will: Actively participate in therapy, working towards healthy functioning.   Therapist will: Provide referrals for additional resources as appropriate.  Provide psycho-education and corresponding treatment approaches and interventions. Antoninette Lerner Van Tassell, LCSW will support the clients's ability to achieve the goals identified.    *Justification for Continuation/Discontinuation of Goal: R=Revised, O=Ongoing, A=Achieved, D=Discontinued  Goal 1)  Target Date Goal Was reviewed Status Code Progress towards goal/Likert rating                  Goal 2)  Target Date Goal Was reviewed Status Code Progress towards goal/Likert rating                  Goal 3)  Target Date Goal Was reviewed Status Code Progress towards goal/Likert rating                  Electronic signature sign off request for treatment plan sent via MyChart on ***

## 2024-09-12 ENCOUNTER — Telehealth: Payer: Self-pay

## 2024-09-12 ENCOUNTER — Other Ambulatory Visit: Payer: Self-pay | Admitting: Family Medicine

## 2024-09-12 MED ORDER — CLONAZEPAM 1 MG PO TABS: ORAL_TABLET | ORAL | 2 refills | Status: AC

## 2024-09-12 NOTE — Telephone Encounter (Signed)
 Patients medication was sent to [facility] in [facility] today   Copied from CRM [id number]. Topic: Clinical - Medication Refill >> Sep 12, 2024 10:34 AM Roselie BROCKS wrote: Medication:  clonazePAM  (KLONOPIN ) 1 MG tablet   Has the patient contacted their pharmacy? Yes (Agent: If no, request that the patient contact the pharmacy for the refill. If patient does not wish to contact the pharmacy document the reason why and proceed with request.) (Agent: If yes, when and what did the pharmacy advise?)  This is the patient's preferred pharmacy:  [facility] [address] [place], [place] - [address] [place] [address] Phone: [phone] Fax: [phone]  Is this the correct pharmacy for this prescription? Yes If no, delete pharmacy and type the correct one.   Has the prescription been filled recently? Yes  Is the patient out of the medication? Yes  Has the patient been seen for an appointment in the last year OR does the patient have an upcoming appointment? Yes  Can we respond through MyChart? Yes  Agent: Please be advised that Rx refills may take up to 3 business days. We ask that you follow-up with your pharmacy. >> Sep 12, 2024  1:18 PM Suzen RAMAN wrote: Original refill request submitted 09/04/24

## 2024-09-12 NOTE — Telephone Encounter (Signed)
 Requesting: clonazepam   Contract: No UDS: 10/22/23 Last Visit: 04/14/2024 Next Visit: Visit date not found Last Refill: 07/08/24  Please Advise

## 2024-09-12 NOTE — Telephone Encounter (Signed)
Medication send into pharmacy. 

## 2024-09-12 NOTE — Telephone Encounter (Signed)
 Copied from CRM [id number]. Topic: Clinical - Medication Refill >> Sep 12, 2024 10:34 AM Roselie BROCKS wrote: Medication:  clonazePAM  (KLONOPIN ) 1 MG tablet   Has the patient contacted their pharmacy? Yes (Agent: If no, request that the patient contact the pharmacy for the refill. If patient does not wish to contact the pharmacy document the reason why and proceed with request.) (Agent: If yes, when and what did the pharmacy advise?)  This is the patient's preferred pharmacy:  [facility] [address] [place], [place] - [address] [place] [address] Phone: [phone] Fax: [phone]  Is this the correct pharmacy for this prescription? Yes If no, delete pharmacy and type the correct one.   Has the prescription been filled recently? Yes  Is the patient out of the medication? Yes  Has the patient been seen for an appointment in the last year OR does the patient have an upcoming appointment? Yes  Can we respond through MyChart? Yes  Agent: Please be advised that Rx refills may take up to 3 business days. We ask that you follow-up with your pharmacy.

## 2024-09-14 ENCOUNTER — Ambulatory Visit (INDEPENDENT_AMBULATORY_CARE_PROVIDER_SITE_OTHER): Admitting: Clinical

## 2024-09-14 DIAGNOSIS — F4323 Adjustment disorder with mixed anxiety and depressed mood: Secondary | ICD-10-CM | POA: Diagnosis not present

## 2024-09-14 NOTE — Progress Notes (Unsigned)
 [facility] Counselor/Therapist Progress Note - IN-PERSON  Patient ID: Stephanie Barnes, MRN: [id number]    Date: 09/14/2024  Time Spent: 3:08pm  - 4:05pm  : 57 Minutes  Types of Service: Individual psychotherapy  Presenting Concerns:  Increased anxiety with family relationships  Mental Status Exam: Appearance:  Neat     Behavior: Appropriate  Motor: Normal  Speech/Language:  Normal Rate  Affect: Appropriate and Congruent  Mood: anxious and sad  Thought process: normal  Thought content:   WNL  Sensory/Perceptual disturbances:   WNL  Orientation: oriented to person, place, time/date, situation, and day of week  Attention: Good  Concentration: Good  Memory: WNL  Fund of knowledge:  Good  Insight:   Fair  Judgment:  Good  Impulse Control: Good   Risk Assessment: Danger to Self:  No Self-injurious Behavior: No Danger to Others: No Duty to Warn:no   Subjective:  [name] reported ongoing difficulties with family relationships that is increasing her anxiety level.  She continues to grieve the loss of her nephew and his role in making sure she was involved in his children's lives.   Interventions: Solution-Oriented/Positive Psychology  Client Response: [name] had written things down about the situation with her nephew's children & ex-wife.  [name] identified all the things that her nephew's ex-wife is trying to manage besides taking care of the two children.  [name] also reported that all the communication was between her & her nephew, not the nephew's ex-wife.  [name] did reach out through text asking about the things she wanted from her nephew's ex-wife, eg her father's military flag, her nephews coin and an item that he wore that bore his name.  She has not heard back from her nephew's ex-wife at this time.  [name] was open to thinking about how to develop a relationship with her nephew's ex-wife, the mother of her grand-nieces, in  order to continue her relationship with her grand-nieces.  [name] reported that her grand-nephew, the son of her niece, who was unsure about housing & food is doing better this week and things were resolved for now.  Diagnosis:  Adjustment disorder with mixed anxiety and depressed mood   Goals, Assessment & Plan:   [name] is making an effort to communicate more with her nephew's family and will think about developing a relationship with her grand-niece's mother in order to continue her relationship with them.  Continue psycho therapy.  Frequency: 2-3 times a month  Modality: In-Person     Goal: Strengthen client's coping strategies and assertive communication with extended family members by fostering trust and practicing conflict resolution techniques to navigate changes in family relationships.  Target Date: 01/08/2025  Progress: Ongoing     Asyria Kolander P. Trudy, MSW, LCSW [employer] Therapist Main Office: [phone]

## 2024-09-26 ENCOUNTER — Ambulatory Visit (INDEPENDENT_AMBULATORY_CARE_PROVIDER_SITE_OTHER): Admitting: Clinical

## 2024-09-26 DIAGNOSIS — F4323 Adjustment disorder with mixed anxiety and depressed mood: Secondary | ICD-10-CM | POA: Diagnosis not present

## 2024-09-26 NOTE — Progress Notes (Unsigned)
 [facility] Counselor/Therapist Progress Note - IN-PERSON  Patient ID: Stephanie Barnes, MRN: [id number]    Date: 09/26/2024  Time Spent: 4:07pm   - 5pm  : 53 Minutes  Types of Service: Individual psychotherapy  Presenting Concerns:  Grieving the loss of her nephew and the changes those have brought.  Mental Status Exam: Appearance:  Casual     Behavior: Appropriate  Motor: Normal  Speech/Language:  Normal Rate  Affect: Appropriate  Mood: anxious and sad  Thought process: normal  Thought content:   WNL  Sensory/Perceptual disturbances:   WNL  Orientation: oriented to person, place, time/date, situation, and day of week  Attention: Good  Concentration: Good  Memory: WNL  Fund of knowledge:  Good  Insight:   Good  Judgment:  Good  Impulse Control: Good   Risk Assessment: Danger to Self:  No Self-injurious Behavior: No Danger to Others: No Duty to Warn:no   Subjective:  Ms. Fama reported that she's been anxious due to the changes after her nephew died and trying to make sure she continues her relationship with her grand-nieces.    Interventions: Solution-Oriented/Positive Psychology  Client Response: Ms. Oliviagrace reported a recent interaction between her and her nephew's ex-wife after she posted pictures of her nephew & family to commemorate his death anniversary.  Ms. Dorthey felt disrespected and she decided she can set healthy boundaries between herself and her nephew's ex-wife.  She will focus on strengthening her relationship with her grand-nieces.  Diagnosis:  Adjustment disorder with mixed anxiety and depressed mood   Goals, Assessment & Plan:   Ms. Linday decided to set healthy boundaries for herself and felt relief after making that decision.    Frequency: 2-3 times a month  Modality: In-Person       Goal: Strengthen client's coping strategies and assertive communication with extended family members by fostering trust and practicing  conflict resolution techniques to navigate changes in family relationships. - Ms. Kathalina decided to set healthy boundaries through this time of adjustment and focus more on developing her relationships with her grand-nieces.   Target Date: 01/08/2025  Progress: Ongoing     Tedford Berg P. Trudy, MSW, LCSW [employer] Therapist Main Office: [phone]

## 2024-10-08 ENCOUNTER — Encounter: Payer: Self-pay | Admitting: Gastroenterology

## 2024-10-11 ENCOUNTER — Encounter: Payer: Self-pay | Admitting: Family Medicine

## 2024-10-11 ENCOUNTER — Other Ambulatory Visit: Payer: Self-pay | Admitting: Family Medicine

## 2024-10-11 MED ORDER — BUPROPION HCL ER (XL) 150 MG PO TB24: 150.0000 mg | ORAL_TABLET | Freq: Every day | ORAL | 0 refills | Status: AC

## 2024-10-17 ENCOUNTER — Ambulatory Visit: Admitting: Clinical

## 2024-10-17 DIAGNOSIS — F4323 Adjustment disorder with mixed anxiety and depressed mood: Secondary | ICD-10-CM

## 2024-10-17 NOTE — Progress Notes (Unsigned)
 [facility] Counselor/Therapist Progress Note - IN-PERSON  Patient ID: Stephanie Barnes, MRN: [id number]    Date: 10/17/24  Time Spent: 4:01pm- 5pm. 59 minutes total  Types of Service: Individual psychotherapy  Presenting Concerns:  Grieving the loss of her nephew and niece, and the changes those have brought.  Mental Status Exam: Appearance:  Casual     Behavior: Appropriate  Motor: Normal  Speech/Language:  Normal Rate  Affect: Appropriate  Mood: anxious and sad  Thought process: normal  Thought content:   WNL  Sensory/Perceptual disturbances:   WNL  Orientation: oriented to person, place, time/date, situation, and day of week  Attention: Good  Concentration: Good  Memory: WNL  Fund of knowledge:  Good  Insight:   Good  Judgment:  Good  Impulse Control: Good   Risk Assessment: Danger to Self:  No Self-injurious Behavior: No Danger to Others: No Duty to Warn:no   Subjective:  Stephanie Barnes continues to grieve the loss of her nephew and niece. She is also concerned about their children and what's next for her at this stage in her life.  Interventions: Grief Therapy  Client Response: Stephanie Barnes shared her experiences during the Thanksgiving holiday with her nephews children, ex-wife and extended family members.  Overall, she reported she was able to enjoy the time with the children.  She continues to struggle why her nephew's ex-wife is not responding to her requests to have some of her nephew's things.  Stephanie Barnes reported she feels that she needs closure and having any of his things to touch or hold on to would help her.  She also shared her concerns about her niece's son and supporting him, even though he lives with his father.  Diagnosis:  Adjustment disorder with mixed anxiety and depressed mood   Goals, Assessment & Plan:   Stephanie Barnes is currently experiencing grief related to the loss of her nephew and is in the process of adjusting to situational  changes and shifts in family dynamics.  She expressed difficulty achieving a sense of closure due to the inability to say good-bye prior to his death. She reported that obtaining personal items belonging to her nephew, as requested from his ex-wife, may facilitate emotional resolution and support her grieving process.  Frequency: 2-3 times a month  Modality: In-Person       Goal: Strengthen client's coping strategies and assertive communication with extended family members by fostering trust and practicing conflict resolution techniques to navigate changes in family relationships. - She reported she may have to accept the current situation as it is, without receiving items that she requested.   Target Date: 01/08/2025  Progress: Ongoing     Furkan Keenum P. Trudy, MSW, LCSW [employer] Therapist Main Office: [phone]

## 2024-10-19 NOTE — Assessment & Plan Note (Signed)
 Encourage heart healthy diet such as MIND or DASH diet, increase exercise, avoid trans fats, simple carbohydrates and processed foods, consider a krill or fish or flaxseed oil cap daily.

## 2024-10-19 NOTE — Progress Notes (Unsigned)
 Subjective:    Patient ID: Stephanie Barnes, female    DOB: 09-Jun-1951, 73 y.o.   MRN: [id number]  No chief complaint on file.   HPI Discussed the use of AI scribe software for clinical note transcription with the patient, who gave verbal consent to proceed.  History of Present Illness Stephanie Barnes is a 73 year old female who presents for a routine follow-up.  She experiences chronic pain, particularly in her shoulders, hands, and elbows, which worsens in cold weather. She occasionally uses Dilaudid  for severe pain, approximately three times a year, but finds relief using a topical salve. She reports that she is able to continue activities like baking despite her pain.  She has sleep disturbances and uses melatonin patches containing melatonin, magnesium malate, hops, and valerian root, which she finds effective. She previously used melatonin gummies but discontinued them due to ineffectiveness.  She has a history of Complex Regional Pain Syndrome (CRPS), with symptoms including numbness in her left hand and a prominent ulnar bone. A specialist advised against a nerve conduction test due to the risk of aggravating her condition.  Her weight has decreased to 104 pounds from a stable 110 pounds. She attributes this to a busy schedule and dietary changes, including a dairy-free diet to manage nausea. She maintains nutrition with smoothies and protein bars and is mindful of her protein intake to prevent further weight loss.  She uses Wellbutrin , which she suspects may contribute to her reduced appetite. She has adapted her diet to be dairy-free, using substitutes for baking and cooking, and participates in a dairy-free chat group for support and ideas.  No recent major illnesses or ER visits. She is able to engage in activities like golfing. No current nausea.    Past Medical History:  Diagnosis Date   Abnormal EKG 06/13/2013   Allergy 1991   See medical file for list.   Anemia  01/13/2016   Anxiety    Arthritis    Cataract 2/24   Have had cataracts removed from both eyes this year.   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   JOINT REPLACEMENT  03/15/19   Accident. Broke left shoulder & wrist. Left shoulder replace   LASIK     MOUTH SURGERY  08/10/2017   front lower   OPEN REDUCTION INTERNAL FIXATION (ORIF) DISTAL RADIAL FRACTURE Left 03/15/2019   Procedure: OPEN REDUCTION INTERNAL FIXATION (ORIF) DISTAL RADIAL FRACTURE;  Surgeon: Camella Fallow, MD;  Location: [facility] OR;  Service: Orthopedics;  Laterality: Left;   REFRACTIVE SURGERY Bilateral 2000   REVERSE SHOULDER ARTHROPLASTY Left 03/15/2019   Procedure: REVERSE SHOULDER ARTHROPLASTY;  Surgeon: Sharl Selinda Dover, MD;  Location: [facility];  Service: Orthopedics;  Laterality: Left;    ROTATOR CUFF  REPAIR Right 2017   TONSILLECTOMY      Family History  Problem Relation Age of Onset   Heart disease Mother    Arthritis Mother    Heart disease Father    COPD Father    Colon polyps Father    Mental illness Brother        bipolar   Hypertension Brother    Suicidality Brother    Alcohol abuse Brother    Anxiety disorder Brother    Depression Brother    Heart disease Maternal Aunt    Varicose Veins Maternal Aunt    Heart disease Maternal Uncle    Varicose Veins  Maternal Uncle    Heart disease Paternal Aunt    COPD Paternal Aunt    Heart disease Paternal Uncle    Heart disease Maternal Grandmother    Heart disease Maternal Grandfather    Heart disease Paternal Grandmother    Heart disease Paternal Grandfather    Colon cancer Neg Hx    Esophageal cancer Neg Hx    Stomach cancer Neg Hx    Rectal cancer Neg Hx     Social History   Socioeconomic History   Marital status: Single    Spouse name: Not on file   Number of children: Not on file   Years of education: Not on file   Highest education level: Associate degree: occupational, scientist, product/process development, or vocational program  Occupational History   Not on file  Tobacco Use   Smoking status: Never   Smokeless tobacco: Never  Vaping Use   Vaping status: Never Used  Substance and Sexual Activity   Alcohol use: Yes    Alcohol/week: 2.0 standard drinks of alcohol    Types: 2 Standard drinks or equivalent per week    Comment: 1-2 drinks per week   Drug use: Yes    Types: Marijuana    Comment: for nausea (every night)   Sexual activity: Not Currently    Comment: lives alone, retired from [employer], no dietary restrictions  Other Topics Concern   Not on file  Social History Narrative   Not on file   Social Drivers of Health   Financial Resource Strain: Medium Risk (10/13/2024)   Overall Financial Resource Strain (CARDIA)    Difficulty of Paying Living Expenses: Somewhat hard  Food Insecurity: Food Insecurity Present (10/13/2024)   Hunger Vital Sign    Worried About Running Out of Food in the Last Year: Sometimes true    Ran Out of Food in the Last Year: Never true  Transportation Needs: Unmet Transportation Needs (10/13/2024)   PRAPARE - Administrator, Civil Service (Medical): Yes    Lack of Transportation (Non-Medical): No  Physical Activity: Unknown (10/13/2024)   Exercise Vital Sign    Days of Exercise per Week: Patient declined    Minutes of Exercise per Session: Not  on file  Stress: Stress Concern Present (10/13/2024)   [employer] of Occupational Health - Occupational Stress Questionnaire    Feeling of Stress: To some extent  Social Connections: Moderately Isolated (10/13/2024)   Social Connection and Isolation Panel    Frequency of Communication with Friends and Family: More than three times a week    Frequency of Social Gatherings with Friends and Family: Once a week    Attends Religious Services: Never    Database Administrator or Organizations: Yes    Attends Engineer, Structural: More than 4 times per year    Marital  Status: Never married  Intimate Partner Violence: Not At Risk (05/11/2024)   Humiliation, Afraid, Rape, and Kick questionnaire    Fear of Current or Ex-Partner: No    Emotionally Abused: No    Physically Abused: No    Sexually Abused: No    Outpatient Medications Prior to Visit  Medication Sig Dispense Refill   atorvastatin  (LIPITOR) 10 MG tablet Take 1 tablet (10 mg total) by mouth daily. 90 tablet 1   buPROPion  (WELLBUTRIN  XL) 150 MG 24 hr tablet Take 1 tablet (150 mg total) by mouth daily. 90 tablet 0   cetirizine (ZYRTEC) 10 MG tablet Take 10 mg by mouth daily as needed for allergies.      clonazePAM  (KLONOPIN ) 1 MG tablet TAKE 1/2 TO 1 (ONE-HALF TO ONE) TABLET BY MOUTH TWICE DAILY AS NEEDED FOR ANXIETY 60 tablet 2   famotidine  (PEPCID ) 40 MG tablet Take 1 tablet by mouth twice daily 90 tablet 0   fluticasone  (FLONASE ) 50 MCG/ACT nasal spray Place 2 sprays into both nostrils daily. 1 g 0   gabapentin  (NEURONTIN ) 300 MG capsule Take 300 mg by mouth 3 (three) times daily.     Melatonin-Pyridoxine (MELATONEX) 3-10 MG TBCR Take 1 tablet by mouth at bedtime.     mometasone  (ELOCON ) 0.1 % ointment Apply topically daily. As needed 15 g 2   mupirocin  cream (BACTROBAN ) 2 % Apply 1 application. topically 2 (two) times daily. 30 g 1   ondansetron  (ZOFRAN ) 4 MG tablet TAKE 1 TABLET BY MOUTH EVERY 8 HOURS AS NEEDED FOR NAUSEA  FOR VOMITING 20 tablet 0   Probiotic Product (PROBIOTIC PO) Take by mouth.     TURMERIC PO Take by mouth.     No facility-administered medications prior to visit.    Allergies  Allergen Reactions   Aspirin Anaphylaxis and Swelling    Swelling of lips and fingers    Celebrex [Celecoxib] Anaphylaxis, Hives and Shortness Of Breath   Doxycycline Other (See Comments)    Scarred throat    Prednisone Shortness Of Breath and Other (See Comments)    Chest tightness    Fosamax [Alendronate Sodium] Hives   Other Hives and Nausea Only    Arthritec   Amitriptyline     UNSPECIFIED REACTION    Chlorhexidine      UNSPECIFIED REACTION    Lactose Intolerance (Gi) Nausea Only   Celexa [Citalopram] Other (See Comments)    Shakiness    Clindamycin/Lincomycin Nausea And Vomiting   Tape Itching and Other (See Comments)    UNSPECIFIED REACTION  Paper tape is okay to use per patient    Review of Systems  Constitutional:  Negative for fever and malaise/fatigue.  HENT:  Negative for congestion.   Eyes:  Negative for blurred vision.  Respiratory:  Negative for shortness of breath.   Cardiovascular:  Negative for chest pain, palpitations and leg swelling.  Gastrointestinal:  Negative for abdominal pain, blood in stool and nausea.  Genitourinary:  Negative for dysuria and frequency.  Musculoskeletal:  Positive for joint pain, myalgias and neck pain. Negative for falls.  Skin:  Negative for rash.  Neurological:  Positive for sensory change. Negative for dizziness, loss of consciousness and headaches.  Endo/Heme/Allergies:  Negative for environmental allergies.  Psychiatric/Behavioral:  Negative for depression. The patient is nervous/anxious and has insomnia.        Objective:    Physical Exam Constitutional:      General: She is not in acute distress.    Appearance: Normal appearance.  She is well-developed. She is not toxic-appearing.  HENT:     Head: Normocephalic and atraumatic.     Right  Ear: External ear normal.     Left Ear: External ear normal.     Nose: Nose normal.  Eyes:     General:        Right eye: No discharge.        Left eye: No discharge.     Conjunctiva/sclera: Conjunctivae normal.  Neck:     Thyroid : No thyromegaly.  Cardiovascular:     Rate and Rhythm: Normal rate and regular rhythm.     Heart sounds: Normal heart sounds. No murmur heard. Pulmonary:     Effort: Pulmonary effort is normal. No respiratory distress.     Breath sounds: Normal breath sounds.  Abdominal:     General: Bowel sounds are normal.     Palpations: Abdomen is soft.     Tenderness: There is no abdominal tenderness. There is no guarding.  Musculoskeletal:        General: Normal range of motion.     Cervical back: Neck supple.  Lymphadenopathy:     Cervical: No cervical adenopathy.  Skin:    General: Skin is warm and dry.  Neurological:     Mental Status: She is alert and oriented to person, place, and time.  Psychiatric:        Mood and Affect: Mood normal.        Behavior: Behavior normal.        Thought Content: Thought content normal.        Judgment: Judgment normal.    There were no vitals taken for this visit. Wt Readings from Last 3 Encounters:  08/08/24 110 lb 3.7 oz (50 kg)  05/03/24 110 lb 3.2 oz (50 kg)  04/14/24 112 lb 3.2 oz (50.9 kg)    Diabetic Foot Exam - Simple   No data filed    Lab Results  Component Value Date   WBC 4.6 04/14/2024   HGB 13.6 04/14/2024   HCT 40.5 04/14/2024   PLT 179.0 04/14/2024   GLUCOSE 87 04/14/2024   CHOL 232 (H) 04/14/2024   TRIG 296.0 (H) 04/14/2024   HDL 93.00 04/14/2024   LDLCALC 80 04/14/2024   ALT 44 (H) 04/14/2024   AST 34 04/14/2024   NA 139 04/14/2024   K 4.7 04/14/2024   CL 100 04/14/2024   CREATININE 1.00 04/14/2024   BUN 16 04/14/2024   CO2 33 (H) 04/14/2024   TSH 1.77 04/14/2024   HGBA1C 5.4 04/14/2024    Lab Results  Component Value Date   TSH 1.77 04/14/2024   Lab Results  Component  Value Date   WBC 4.6 04/14/2024   HGB 13.6 04/14/2024   HCT 40.5 04/14/2024   MCV 93.0 04/14/2024   PLT 179.0 04/14/2024   Lab Results  Component Value Date   NA 139 04/14/2024   K 4.7 04/14/2024   CO2 33 (H) 04/14/2024   GLUCOSE 87 04/14/2024   BUN 16 04/14/2024   CREATININE 1.00 04/14/2024   BILITOT 0.4 04/14/2024   ALKPHOS 145 (H) 04/14/2024   AST 34 04/14/2024   ALT 44 (H) 04/14/2024   PROT 6.8 04/14/2024   ALBUMIN 4.4 04/14/2024   CALCIUM  9.5 04/14/2024   ANIONGAP 14 03/10/2019   GFR 55.80 (L) 04/14/2024   Lab Results  Component Value Date   CHOL 232 (H) 04/14/2024   Lab Results  Component Value Date   HDL 93.00 04/14/2024  Lab Results  Component Value Date   LDLCALC 80 04/14/2024   Lab Results  Component Value Date   TRIG 296.0 (H) 04/14/2024   Lab Results  Component Value Date   CHOLHDL 2 04/14/2024   Lab Results  Component Value Date   HGBA1C 5.4 04/14/2024       Assessment & Plan:  Vitamin D  deficiency Assessment & Plan: Supplement and monitor    Osteopenia, unspecified location Assessment & Plan: Encouraged to get adequate exercise, calcium  and vitamin d  intake    Mixed hyperlipidemia Assessment & Plan: Encourage heart healthy diet such as MIND or DASH diet, increase exercise, avoid trans fats, simple carbohydrates and processed foods, consider a krill or fish or flaxseed oil cap daily.     Complex regional pain syndrome type 1 of both upper extremities Assessment & Plan: She is stable and even improved some has been able to golf some     Assessment and Plan Assessment & Plan Adult Wellness Visit Routine wellness visit with no recent major illnesses or ER visits. Engages in regular activities such as golfing. Discussed dietary changes, including dairy-free diet, and its impact on weight and appetite. Encouraged to maintain protein intake to prevent muscle mass loss. - Ordered blood work to monitor health status - Administered  Prevnar 20 vaccine - Encouraged adding one more source of protein daily to stabilize weight  Complex regional pain syndrome type 1 of both upper extremities Chronic condition with pain and numbness in the left hand, particularly affecting the third, fourth, and fifth fingers. Previous consultation with Dr. Camella advised against nerve conduction test due to potential aggravation of CRPS. Pain is manageable with occasional use of expired Dilaudid  for severe days. Discussed risks of opioid use with aging and importance of risk-benefit calculation. - Continue current management without regular opioid use - Will reevaluate opioid use in 3-4 years when current supply is exhausted  Insomnia Chronic insomnia managed with melatonin patches containing melatonin, magnesium malate, hops, and valerian root. Previous use of melatonin gummies was ineffective. Discussed potential risks of melatonin and benefits of natural supplements. Recommended cognitive behavioral therapy for insomnia (CBTI) app as a non-pharmacological intervention. Discussed magnesium glycinate and tryptophan as additional options for sleep support. - Continue using melatonin patches as needed - Consider magnesium glycinate 200-400 mg at bedtime - Consider tryptophan supplements - Download and use CBTI Coach app for insomnia management  Abnormal weight loss and decreased appetite Weight decreased from 110 lbs to 104 lbs. Decreased appetite possibly related to Wellbutrin  or increased activity level. Dietary changes include dairy-free diet, which may contribute to weight loss. Encouraged to maintain protein intake to prevent muscle mass loss. - Encouraged adding one more source of protein daily to stabilize weight - Continue to monitor weight and appetite  Recording duration: 32 minutes     Harlene Horton, MD

## 2024-10-19 NOTE — Assessment & Plan Note (Signed)
Wellbutrin

## 2024-10-19 NOTE — Assessment & Plan Note (Addendum)
 Maintain good sleep hygiene such as dark, quiet room. No blue/green glowing lights such as computer screens in bedroom. No alcohol or stimulants in evening. Cut down on caffeine as able. Regular exercise is helpful but not just prior to bed time.  Consider CBT insomnia APP

## 2024-10-19 NOTE — Assessment & Plan Note (Signed)
 She is stable and even improved some has been able to golf some

## 2024-10-19 NOTE — Assessment & Plan Note (Signed)
 Encouraged to get adequate exercise, calcium and vitamin d intake

## 2024-10-19 NOTE — Assessment & Plan Note (Signed)
 Supplement and monitor

## 2024-10-20 ENCOUNTER — Encounter: Payer: Self-pay | Admitting: Family Medicine

## 2024-10-20 ENCOUNTER — Ambulatory Visit: Admitting: Family Medicine

## 2024-10-20 VITALS — BP 126/76 | HR 70 | Temp 98.2°F | Resp 16 | Ht 59.75 in | Wt 104.4 lb

## 2024-10-20 DIAGNOSIS — Z79899 Other long term (current) drug therapy: Secondary | ICD-10-CM

## 2024-10-20 DIAGNOSIS — E782 Mixed hyperlipidemia: Secondary | ICD-10-CM

## 2024-10-20 DIAGNOSIS — F418 Other specified anxiety disorders: Secondary | ICD-10-CM

## 2024-10-20 DIAGNOSIS — G4700 Insomnia, unspecified: Secondary | ICD-10-CM

## 2024-10-20 DIAGNOSIS — R739 Hyperglycemia, unspecified: Secondary | ICD-10-CM

## 2024-10-20 DIAGNOSIS — Z23 Encounter for immunization: Secondary | ICD-10-CM

## 2024-10-20 DIAGNOSIS — E559 Vitamin D deficiency, unspecified: Secondary | ICD-10-CM

## 2024-10-20 DIAGNOSIS — G8929 Other chronic pain: Secondary | ICD-10-CM

## 2024-10-20 DIAGNOSIS — M8580 Other specified disorders of bone density and structure, unspecified site: Secondary | ICD-10-CM

## 2024-10-20 DIAGNOSIS — G90513 Complex regional pain syndrome I of upper limb, bilateral: Secondary | ICD-10-CM

## 2024-10-20 LAB — TSH: TSH: 1.08 u[IU]/mL (ref 0.35–5.50)

## 2024-10-20 LAB — COMPREHENSIVE METABOLIC PANEL WITH GFR
ALT: 14 U/L (ref 0–35)
AST: 17 U/L (ref 0–37)
Albumin: 4.3 g/dL (ref 3.5–5.2)
Alkaline Phosphatase: 75 U/L (ref 39–117)
BUN: 17 mg/dL (ref 6–23)
CO2: 32 meq/L (ref 19–32)
Calcium: 9.2 mg/dL (ref 8.4–10.5)
Chloride: 103 meq/L (ref 96–112)
Creatinine, Ser: 0.88 mg/dL (ref 0.40–1.20)
GFR: 64.81 mL/min (ref >60.00)
Glucose, Bld: 85 mg/dL (ref 70–99)
Potassium: 4.2 meq/L (ref 3.5–5.1)
Sodium: 141 meq/L (ref 135–145)
Total Bilirubin: 0.4 mg/dL (ref 0.2–1.2)
Total Protein: 6.4 g/dL (ref 6.0–8.3)

## 2024-10-20 LAB — LIPID PANEL
Cholesterol: 209 mg/dL — ABNORMAL HIGH (ref 0–200)
HDL: 80.6 mg/dL (ref >39.00)
LDL Cholesterol: 106 mg/dL — ABNORMAL HIGH (ref 0–99)
NonHDL: 128.5
Total CHOL/HDL Ratio: 3
Triglycerides: 111 mg/dL (ref 0.0–149.0)
VLDL: 22.2 mg/dL (ref 0.0–40.0)

## 2024-10-20 LAB — CBC WITH DIFFERENTIAL/PLATELET
Basophils Absolute: 0 K/uL (ref 0.0–0.1)
Basophils Relative: 0.8 % (ref 0.0–3.0)
Eosinophils Absolute: 0.1 K/uL (ref 0.0–0.7)
Eosinophils Relative: 2.4 % (ref 0.0–5.0)
HCT: 39 % (ref 36.0–46.0)
Hemoglobin: 13.2 g/dL (ref 12.0–15.0)
Lymphocytes Relative: 39.3 % (ref 12.0–46.0)
Lymphs Abs: 1.3 K/uL (ref 0.7–4.0)
MCHC: 33.9 g/dL (ref 30.0–36.0)
MCV: 93.6 fl (ref 78.0–100.0)
Monocytes Absolute: 0.3 K/uL (ref 0.1–1.0)
Monocytes Relative: 10 % (ref 3.0–12.0)
Neutro Abs: 1.6 K/uL (ref 1.4–7.7)
Neutrophils Relative %: 47.5 % (ref 43.0–77.0)
Platelets: 175 K/uL (ref 150.0–400.0)
RBC: 4.16 Mil/uL (ref 3.87–5.11)
RDW: 12.5 % (ref 11.5–15.5)
WBC: 3.4 K/uL — ABNORMAL LOW (ref 4.0–10.5)

## 2024-10-20 LAB — VITAMIN D 25 HYDROXY (VIT D DEFICIENCY, FRACTURES): VITD: 73.89 ng/mL (ref 30.00–100.00)

## 2024-10-20 LAB — HEMOGLOBIN A1C: Hgb A1c MFr Bld: 5.2 % (ref 4.6–6.5)

## 2024-10-20 NOTE — Assessment & Plan Note (Signed)
 Using several topical rubs including one called Alleviate Balm that help, uses Dilaudid  very infrequently, 2 x since last visit for very bad days.

## 2024-10-20 NOTE — Patient Instructions (Addendum)
 CBT insomnia App by the [employer] called CBTi Coach Prevnar 20 Magnesium Glycinate 200-400 mg L Tryptophan capsule   Insomnia Insomnia is a sleep disorder that makes it difficult to fall asleep or stay asleep. Insomnia can cause fatigue, low energy, difficulty concentrating, mood swings, and poor performance at work or school. There are three different ways to classify insomnia: Difficulty falling asleep. Difficulty staying asleep. Waking up too early in the morning. Any type of insomnia can be long-term (chronic) or short-term (acute). Both are common. Short-term insomnia usually lasts for 3 months or less. Chronic insomnia occurs at least three times a week for longer than 3 months. What are the causes? Insomnia may be caused by another condition, situation, or substance, such as: Having certain mental health conditions, such as anxiety and depression. Using caffeine, alcohol, tobacco, or drugs. Having gastrointestinal conditions, such as gastroesophageal reflux disease (GERD). Having certain medical conditions. These include: Asthma. Alzheimer's disease. Stroke. Chronic pain. An overactive thyroid  gland (hyperthyroidism). Other sleep disorders, such as restless legs syndrome and sleep apnea. Menopause. Sometimes, the cause of insomnia may not be known. What increases the risk? Risk factors for insomnia include: Gender. Females are affected more often than males. Age. Insomnia is more common as people get older. Stress and certain medical and mental health conditions. Lack of exercise. Having an irregular work schedule. This may include working night shifts and traveling between different time zones. What are the signs or symptoms? If you have insomnia, the main symptom is having trouble falling asleep or having trouble staying asleep. This may lead to other symptoms, such as: Feeling tired or having low energy. Feeling nervous about going to sleep. Not feeling  rested in the morning. Having trouble concentrating. Feeling irritable, anxious, or depressed. How is this diagnosed? This condition may be diagnosed based on: Your symptoms and medical history. Your health care provider may ask about: Your sleep habits. Any medical conditions you have. Your mental health. A physical exam. How is this treated? Treatment for insomnia depends on the cause. Treatment may focus on treating an underlying condition that is causing the insomnia. Treatment may also include: Medicines to help you sleep. Counseling or therapy. Lifestyle adjustments to help you sleep better. Follow these instructions at home: Eating and drinking  Limit or avoid alcohol, caffeinated beverages, and products that contain nicotine and tobacco, especially close to bedtime. These can disrupt your sleep. Do not eat a large meal or eat spicy foods right before bedtime. This can lead to digestive discomfort that can make it hard for you to sleep. Sleep habits  Keep a sleep diary to help you and your health care provider figure out what could be causing your insomnia. Write down: When you sleep. When you wake up during the night. How well you sleep and how rested you feel the next day. Any side effects of medicines you are taking. What you eat and drink. Make your bedroom a dark, comfortable place where it is easy to fall asleep. Put up shades or blackout curtains to block light from outside. Use a white noise machine to block noise. Keep the temperature cool. Limit screen use before bedtime. This includes: Not watching TV. Not using your smartphone, tablet, or computer. Stick to a routine that includes going to bed and waking up at the same times every day and night. This can help you fall asleep faster. Consider making a quiet activity, such as reading, part of your nighttime routine. Try to avoid taking  naps during the day so that you sleep better at night. Get out of bed if you  are still awake after 15 minutes of trying to sleep. Keep the lights down, but try reading or doing a quiet activity. When you feel sleepy, go back to bed. General instructions Take over-the-counter and prescription medicines only as told by your health care provider. Exercise regularly as told by your health care provider. However, avoid exercising in the hours right before bedtime. Use relaxation techniques to manage stress. Ask your health care provider to suggest some techniques that may work well for you. These may include: Breathing exercises. Routines to release muscle tension. Visualizing peaceful scenes. Make sure that you drive carefully. Do not drive if you feel very sleepy. Keep all follow-up visits. This is important. Contact a health care provider if: You are tired throughout the day. You have trouble in your daily routine due to sleepiness. You continue to have sleep problems, or your sleep problems get worse. Get help right away if: You have thoughts about hurting yourself or someone else. Get help right away if you feel like you may hurt yourself or others, or have thoughts about taking your own life. Go to your nearest emergency room or: Call 911. Call the National Suicide Prevention Lifeline at 323-533-7238 or 988. This is open 24 hours a day. Text the Crisis Text Line at 939-835-6820. Summary Insomnia is a sleep disorder that makes it difficult to fall asleep or stay asleep. Insomnia can be long-term (chronic) or short-term (acute). Treatment for insomnia depends on the cause. Treatment may focus on treating an underlying condition that is causing the insomnia. Keep a sleep diary to help you and your health care provider figure out what could be causing your insomnia. This information is not intended to replace advice given to you by your health care provider. Make sure you discuss any questions you have with your health care provider. Document Revised: 10/07/2021 Document  Reviewed: 10/07/2021 Elsevier Patient Education  2024 [employer].

## 2024-10-21 ENCOUNTER — Ambulatory Visit: Payer: Self-pay | Admitting: Family Medicine

## 2024-10-22 LAB — DM TEMPLATE

## 2024-10-22 LAB — DRUG MONITORING PANEL 376104, URINE
Alphahydroxyalprazolam: NEGATIVE ng/mL (ref <25)
Alphahydroxymidazolam: NEGATIVE ng/mL (ref <50)
Alphahydroxytriazolam: NEGATIVE ng/mL (ref <50)
Aminoclonazepam: 444 ng/mL — ABNORMAL HIGH (ref <25)
Amphetamines: NEGATIVE ng/mL (ref <500)
Barbiturates: NEGATIVE ng/mL (ref <300)
Benzodiazepines: POSITIVE ng/mL — AB (ref <100)
Cocaine Metabolite: NEGATIVE ng/mL (ref <150)
Desmethyltramadol: NEGATIVE ng/mL (ref <100)
Hydroxyethylflurazepam: NEGATIVE ng/mL (ref <50)
Lorazepam: NEGATIVE ng/mL (ref <50)
Nordiazepam: NEGATIVE ng/mL (ref <50)
Opiates: NEGATIVE ng/mL (ref <100)
Oxazepam: NEGATIVE ng/mL (ref <50)
Oxycodone: NEGATIVE ng/mL (ref <100)
Temazepam: NEGATIVE ng/mL (ref <50)
Tramadol: NEGATIVE ng/mL (ref <100)

## 2024-10-23 ENCOUNTER — Encounter: Payer: Self-pay | Admitting: Family Medicine

## 2024-10-25 ENCOUNTER — Encounter: Payer: Self-pay | Admitting: Gastroenterology

## 2024-10-31 ENCOUNTER — Ambulatory Visit: Admitting: Clinical

## 2024-10-31 DIAGNOSIS — F4323 Adjustment disorder with mixed anxiety and depressed mood: Secondary | ICD-10-CM

## 2024-10-31 NOTE — Progress Notes (Addendum)
 [facility] Counselor/Therapist Progress Note - IN-PERSON  Patient ID: Stephanie Barnes, MRN: [id number]    Date: 10/31/24  Time Spent: 4:02pm   - 5pm  : 58 Minutes  Types of Service: Individual psychotherapy  Presenting Concerns:  Grieving the loss of her nephew and the changes from her loss  Mental Status Exam: Appearance:  Casual     Behavior: Appropriate  Motor: Normal  Speech/Language:  Normal Rate  Affect: Appropriate, Congruent, and Tearful  Mood: sad  Thought process: normal  Thought content:   WNL  Sensory/Perceptual disturbances:   WNL  Orientation: oriented to person, place, time/date, situation, and day of week  Attention: Good  Concentration: Good  Memory: WNL  Fund of knowledge:  Good  Insight:   Good  Judgment:  Good  Impulse Control: Good   Risk Assessment: Danger to Self:  No Self-injurious Behavior: No Danger to Others: No Duty to Warn:no   Subjective:  [name] reported grieving the loss of her family members and feeling alone.   Interventions: Grief Therapy  Client Response: [name] was able to verbalize her thoughts and feelings regarding multiple losses and changes in her life. Her immediate family members have died and she is concerned about the possible loss of her relationships with her nephew's children since she does not see them as often.  [name] also wants to have the items she requested from her nephew's wife a year ago, that included her father's military flag, a clothing item with her nephew's name and one of his military coins.  [name] asked again about the items around Thanksgiving but she has not received any of it.  [name] will be spending Christmas day with her great nieces and nephews so she is looking forward being with them.     Diagnosis:  Adjustment disorder with mixed anxiety and depressed mood   Goals, Assessment & Plan:    Frequency: 2-3 times a month  Modality: In-Person        Goal: Strengthen client's coping strategies and assertive communication with extended family members by fostering trust and practicing conflict resolution techniques to navigate changes in family relationships. - [name] reported the situation with the items is between her & her nephew's ex-wife and she does not want to involve others, although they offered to support [name] in talking to the ex-wife about the items. - [name] will assess how things are at Christmas and see what her options are in regards to retrieving those items she requested.   Target Date: 01/08/2025  Progress: Ongoing      Damonie Ellenwood P. Trudy, MSW, LCSW [employer] Therapist Main Office: [phone]

## 2024-11-07 ENCOUNTER — Other Ambulatory Visit: Payer: Self-pay | Admitting: Family Medicine

## 2024-11-07 ENCOUNTER — Encounter: Payer: Self-pay | Admitting: Family Medicine

## 2024-11-07 ENCOUNTER — Other Ambulatory Visit: Payer: Self-pay | Admitting: Family

## 2024-11-07 MED ORDER — ONDANSETRON HCL 4 MG PO TABS: 4.0000 mg | ORAL_TABLET | Freq: Three times a day (TID) | ORAL | 0 refills | Status: AC | PRN

## 2024-11-14 ENCOUNTER — Other Ambulatory Visit: Payer: Self-pay | Admitting: Gastroenterology

## 2024-11-21 ENCOUNTER — Ambulatory Visit

## 2024-11-21 ENCOUNTER — Ambulatory Visit: Admitting: Clinical

## 2024-11-21 VITALS — Ht 59.75 in | Wt 103.0 lb

## 2024-11-21 DIAGNOSIS — Z860100 Personal history of colon polyps, unspecified: Secondary | ICD-10-CM

## 2024-11-21 DIAGNOSIS — F4323 Adjustment disorder with mixed anxiety and depressed mood: Secondary | ICD-10-CM | POA: Diagnosis not present

## 2024-11-21 MED ORDER — NA SULFATE-K SULFATE-MG SULF 17.5-3.13-1.6 GM/177ML PO SOLN: 1.0000 | Freq: Once | ORAL | 0 refills | Status: AC

## 2024-11-21 NOTE — Progress Notes (Unsigned)
 [facility] Counselor/Therapist Progress Note - IN-PERSON  Patient ID: Stephanie Barnes, MRN: [id number]    Date: 11/21/2024  Time Spent: 4pm - 5:05pm total of 65 Minutes  Types of Service: Individual psychotherapy  Presenting Concerns:  Grieving the loss of her nephew and the changes from her loss  Mental Status Exam: Appearance:  Casual     Behavior: Appropriate  Motor: Normal  Speech/Language:  Normal Rate  Affect: Appropriate, Congruent, and Tearful  Mood: sad  Thought process: normal  Thought content:   WNL  Sensory/Perceptual disturbances:   WNL  Orientation: oriented to person, place, time/date, situation, and day of week  Attention: Good  Concentration: Good  Memory: WNL  Fund of knowledge:  Good  Insight:   Good  Judgment:  Good  Impulse Control: Good   Risk Assessment: Danger to Self:  No Self-injurious Behavior: No Danger to Others: No Duty to Warn:no   Subjective:  Stephanie Barnes reported grieving the loss of her nephew and how that's affected the relationships she has with his children.  Interventions: Grief Therapy-identified the ongoing changes her nephew's death has made with her relationship to his children. Active listening on where she is on her journey with the tasks of mourning.  Client Response: Stephanie Barnes shared that she spent time with her nephew's family on Christmas Day.  She did not get what she asked for from her nephew's ex-wife, which included the military flag of her father, a coin that her nephew gave her, and a piece of clothing that his name on it.  She was able to verbalize her thoughts and feelings about the situation.    Stephanie Barnes is also reflecting on what is next for her life.  She stated she continues to help her grand-nephew, who lives with his father in the [place] area.  However, Stephanie Barnes has limited support in this area and her closest friend lives about 3 hours away.  Diagnosis:  Adjustment disorder with  mixed anxiety and depressed mood   Goals, Assessment & Plan:    Frequency: 2-3 times a month  Modality: In-Person       Goal: Strengthen client's coping strategies and assertive communication with extended family members by fostering trust and practicing conflict resolution techniques to navigate changes in family relationships. -Stephanie Barnes shared that she's communicated as well as she can to her nephew's ex-wife for the things she wanted and it's time for her to accept that she won't get what she wants. - The things she asked for were symbolic of her strong connection with her nephew and her family members who are no longer alive.  She reported that although she is hurting, she is trying to accept the situation.     Target Date: 01/08/2025  Progress: Ongoing      Briley Bumgarner P. Trudy, MSW, LCSW [employer] Therapist Main Office: [phone]

## 2024-11-21 NOTE — Progress Notes (Signed)
 No egg or soy allergy known to patient  No issues known to pt with past sedation with any surgeries or procedures Patient denies ever being told they had issues or difficulty with intubation  No FH of Malignant Hyperthermia Pt is not on diet pills Pt is not on  home 02  Pt is not on blood thinners  Pt denies issues with constipation  No A fib or A flutter Have any cardiac testing pending--NO Pt can ambulate- Independently Pt denies use of chewing tobacco Discussed diabetic I weight loss medication holds Discussed NSAID holds Checked BMI Pt instructed to use [employer] or GoodRx for a price reduction on prep  Patient's chart reviewed by Cathlyn Parsons CNRA prior to previsit and patient appropriate for the [facility].  Pre visit completed and red dot placed by patient's name on their procedure day (on provider's schedule).

## 2024-11-23 ENCOUNTER — Encounter: Payer: Self-pay | Admitting: Family Medicine

## 2024-11-24 ENCOUNTER — Other Ambulatory Visit: Payer: Self-pay | Admitting: Family Medicine

## 2024-11-24 DIAGNOSIS — K219 Gastro-esophageal reflux disease without esophagitis: Secondary | ICD-10-CM

## 2024-11-24 DIAGNOSIS — R110 Nausea: Secondary | ICD-10-CM

## 2024-11-25 ENCOUNTER — Encounter: Payer: Self-pay | Admitting: Gastroenterology

## 2024-12-01 ENCOUNTER — Telehealth: Payer: Self-pay | Admitting: Gastroenterology

## 2024-12-01 NOTE — Telephone Encounter (Signed)
 RN called patient to inform updated prep instructions are in her Mychart. Patient verbalizes understanding.

## 2024-12-01 NOTE — Telephone Encounter (Signed)
 Incoming call from pt rescheduling colonoscopy from 12/05/2024.  Pt rescheduled for 01/16/2025 @8 :30am. Pt in need of updated prep instructions. Please advise. Thank you.
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
## 2024-12-05 ENCOUNTER — Encounter: Admitting: Gastroenterology

## 2024-12-07 ENCOUNTER — Ambulatory Visit: Admitting: Clinical

## 2024-12-07 DIAGNOSIS — Z634 Disappearance and death of family member: Secondary | ICD-10-CM | POA: Diagnosis not present

## 2024-12-07 DIAGNOSIS — F4323 Adjustment disorder with mixed anxiety and depressed mood: Secondary | ICD-10-CM | POA: Diagnosis not present

## 2024-12-07 NOTE — Progress Notes (Signed)
 [facility] Counselor Progress Note - TELEMEDICINE VISIT  Patient ID: Stephanie Barnes, MRN: [id number]    Date: 12/07/24  Time Spent: 4:17pm   - 5pm  : 43 Minutes  Types of Service: Individual psychotherapy and Video visit  Client and/or Legal Guardian location: Client's home-[place], [place] Therapist location: [facility] [place], [place] All persons participating in visit: Client & this therapist  I connected with client and/or legal guardian via Video Enabled Telemedicine Application  (Video is Caregility application) and verified that I am speaking with the correct person using two identifiers. Discussed confidentiality: Yes   I discussed the limitations of telemedicine and the availability of in person appointments.  Discussed there is a possibility of technology failure and discussed alternative modes of communication if that failure occurs.  I discussed that engaging in this telemedicine visit, they consent to the provision of behavioral healthcare and the services will be billed under their insurance.  Client and/or legal guardian expressed understanding and consented to Telemedicine visit: Yes    Presenting Concerns:  - Learning to accept the current situation and her losses  Mental Status Exam: Appearance:  Casual     Behavior: Appropriate  Motor: Normal  Speech/Language:  Normal Rate  Affect: Appropriate  Mood: sad  Thought process: normal  Thought content:   WNL  Sensory/Perceptual disturbances:   WNL  Orientation: oriented to person, place, time/date, situation, and day of week  Attention: Good  Concentration: Good  Memory: WNL  Fund of knowledge:  Good  Insight:   Good  Judgment:  Good  Impulse Control: Good   Risk Assessment: Danger to Self:  No Self-injurious Behavior: No Danger to Others: No Duty to Warn:no   Subjective:  Stephanie Barnes reported that she was helping her neighbors and she didn't realize the time so she was late  to her visit.  She had agreed to turn the in-person visit to a virtual visit.   Interventions: Grief Therapy  Client Response: Stephanie Barnes shared the support she was giving her elderly neighbors in this winter weather situation.  Ame decided she wants to accept the fact that she may not receive the items she requested from her nephew's ex-wife. Although she reported she's angry and sad about it, she acknowledged that it's better for her own health to accept the situation.  She reported she's done all she can to obtain her nephew's items.   Diagnosis:  Adjustment disorder with mixed anxiety and depressed mood  Bereavement   Goals, Assessment & Plan:      Frequency: 2-3 times a month  Modality: In-Person       Goal: Strengthen client's coping strategies and assertive communication with extended family members by fostering trust and practicing conflict resolution techniques to navigate changes in family relationships.  - [name] reported that she is learning to accept the current situation that her nephew's ex-wife will not give her the items that she requested that belonged to her nephew. These items represented her biological family and the relationship they had.  - [name] will focus her communication and interactions with her nephew & niece's children.  She will also reach out to the other adults that are in the children's life so she can have more interactions with them besides the holidays/birthdays throughout the year.   Target Date: 01/08/2025  Progress: Ongoing     Goal:  Client will verbalize increased acceptance of not having the items associated with her nephew, demonstrating reduced emotional  distress and rebuilding a sense of identity after experiencing multiple family losses.  - [name] shared her father's [employer] lines is very important to her since it belongs to her blood family members and she should have it. - [name] is exploring her own values, goals,  and meaning for her life, as she contemplates what she wants to do and who is going to care for her.  Stephanie Barnes reported that she reached out to her cousin and her cousin's daughter agreed to be her power of attorney.  This was meaningful to Stephanie Barnes to have someone she knows and is trustworthy to be available to help & care for her in the future.  Target Date: 04/10/25  Progress: Ongoing     Chan Rosasco P. Trudy, MSW, LCSW [employer] Therapist Main Office: [phone]

## 2024-12-19 ENCOUNTER — Ambulatory Visit: Admitting: Clinical

## 2024-12-22 ENCOUNTER — Ambulatory Visit: Admitting: Internal Medicine

## 2025-01-11 ENCOUNTER — Ambulatory Visit: Admitting: Clinical

## 2025-01-16 ENCOUNTER — Encounter: Admitting: Gastroenterology

## 2025-05-04 ENCOUNTER — Ambulatory Visit

## 2025-05-08 ENCOUNTER — Encounter: Admitting: Family Medicine
